# Patient Record
Sex: Male | Born: 1940 | Race: White | Hispanic: No | Marital: Married | Smoking: Never smoker
Health system: Southern US, Community
[De-identification: ages and names within clinical notes are randomized; demographics above are authoritative.]

## PROBLEM LIST (undated history)

## (undated) DIAGNOSIS — M1990 Unspecified osteoarthritis, unspecified site: Secondary | ICD-10-CM

## (undated) DIAGNOSIS — D649 Anemia, unspecified: Secondary | ICD-10-CM

## (undated) DIAGNOSIS — F329 Major depressive disorder, single episode, unspecified: Secondary | ICD-10-CM

## (undated) DIAGNOSIS — F0390 Unspecified dementia without behavioral disturbance: Secondary | ICD-10-CM

## (undated) DIAGNOSIS — K922 Gastrointestinal hemorrhage, unspecified: Secondary | ICD-10-CM

## (undated) DIAGNOSIS — E785 Hyperlipidemia, unspecified: Secondary | ICD-10-CM

## (undated) DIAGNOSIS — F419 Anxiety disorder, unspecified: Secondary | ICD-10-CM

## (undated) DIAGNOSIS — F32A Depression, unspecified: Secondary | ICD-10-CM

## (undated) DIAGNOSIS — K648 Other hemorrhoids: Secondary | ICD-10-CM

## (undated) DIAGNOSIS — I10 Essential (primary) hypertension: Secondary | ICD-10-CM

## (undated) DIAGNOSIS — E039 Hypothyroidism, unspecified: Secondary | ICD-10-CM

## (undated) DIAGNOSIS — Z86010 Personal history of colonic polyps: Secondary | ICD-10-CM

## (undated) HISTORY — DX: Unspecified osteoarthritis, unspecified site: M19.90

## (undated) HISTORY — DX: Other hemorrhoids: K64.8

## (undated) HISTORY — PX: COLONOSCOPY: SHX174

## (undated) HISTORY — DX: Hypothyroidism, unspecified: E03.9

## (undated) HISTORY — PX: JOINT REPLACEMENT: SHX530

## (undated) HISTORY — DX: Depression, unspecified: F32.A

## (undated) HISTORY — DX: Major depressive disorder, single episode, unspecified: F32.9

## (undated) HISTORY — DX: Hyperlipidemia, unspecified: E78.5

## (undated) HISTORY — PX: HERNIA REPAIR: SHX51

## (undated) HISTORY — DX: Anxiety disorder, unspecified: F41.9

## (undated) HISTORY — DX: Personal history of colonic polyps: Z86.010

---

## 1998-08-29 HISTORY — PX: UMBILICAL HERNIA REPAIR: SHX196

## 2004-07-13 ENCOUNTER — Ambulatory Visit: Payer: Self-pay | Admitting: Internal Medicine

## 2004-08-13 ENCOUNTER — Ambulatory Visit: Payer: Self-pay | Admitting: Internal Medicine

## 2004-09-13 ENCOUNTER — Ambulatory Visit: Payer: Self-pay | Admitting: Internal Medicine

## 2004-09-23 ENCOUNTER — Ambulatory Visit: Payer: Self-pay | Admitting: Internal Medicine

## 2004-10-15 ENCOUNTER — Ambulatory Visit: Payer: Self-pay | Admitting: Internal Medicine

## 2004-11-23 ENCOUNTER — Ambulatory Visit: Payer: Self-pay | Admitting: Internal Medicine

## 2004-11-30 ENCOUNTER — Ambulatory Visit: Payer: Self-pay | Admitting: Internal Medicine

## 2004-12-17 ENCOUNTER — Ambulatory Visit: Payer: Self-pay | Admitting: Internal Medicine

## 2005-01-14 ENCOUNTER — Ambulatory Visit: Payer: Self-pay | Admitting: Internal Medicine

## 2005-03-11 ENCOUNTER — Ambulatory Visit: Payer: Self-pay | Admitting: Internal Medicine

## 2005-03-25 ENCOUNTER — Ambulatory Visit: Payer: Self-pay | Admitting: Internal Medicine

## 2005-04-22 ENCOUNTER — Ambulatory Visit: Payer: Self-pay | Admitting: Internal Medicine

## 2005-07-01 ENCOUNTER — Ambulatory Visit: Payer: Self-pay | Admitting: Internal Medicine

## 2005-09-30 ENCOUNTER — Ambulatory Visit: Payer: Self-pay | Admitting: Internal Medicine

## 2005-12-30 ENCOUNTER — Ambulatory Visit: Payer: Self-pay | Admitting: Internal Medicine

## 2006-01-19 ENCOUNTER — Ambulatory Visit: Payer: Self-pay | Admitting: Internal Medicine

## 2006-02-23 ENCOUNTER — Ambulatory Visit: Payer: Self-pay | Admitting: Internal Medicine

## 2006-06-23 ENCOUNTER — Ambulatory Visit: Payer: Self-pay | Admitting: Internal Medicine

## 2006-11-03 ENCOUNTER — Ambulatory Visit: Payer: Self-pay | Admitting: Internal Medicine

## 2006-11-03 LAB — CONVERTED CEMR LAB
Albumin: 3.9 g/dL (ref 3.5–5.2)
BUN: 18 mg/dL (ref 6–23)
CO2: 30 meq/L (ref 19–32)
Calcium: 9.4 mg/dL (ref 8.4–10.5)
Cholesterol: 169 mg/dL (ref 0–200)
Creatinine,U: 299.9 mg/dL
GFR calc Af Amer: 86 mL/min
GFR calc non Af Amer: 71 mL/min
LDL Cholesterol: 97 mg/dL (ref 0–99)
Microalb, Ur: 2.1 mg/dL — ABNORMAL HIGH (ref 0.0–1.9)
Phosphorus: 3.4 mg/dL (ref 2.3–4.6)
Potassium: 4.3 meq/L (ref 3.5–5.1)
Total CHOL/HDL Ratio: 5.2

## 2007-02-15 DIAGNOSIS — F3341 Major depressive disorder, recurrent, in partial remission: Secondary | ICD-10-CM | POA: Insufficient documentation

## 2007-02-15 DIAGNOSIS — E785 Hyperlipidemia, unspecified: Secondary | ICD-10-CM | POA: Insufficient documentation

## 2007-02-15 DIAGNOSIS — E039 Hypothyroidism, unspecified: Secondary | ICD-10-CM | POA: Insufficient documentation

## 2007-02-15 DIAGNOSIS — F410 Panic disorder [episodic paroxysmal anxiety] without agoraphobia: Secondary | ICD-10-CM | POA: Insufficient documentation

## 2007-02-15 DIAGNOSIS — F331 Major depressive disorder, recurrent, moderate: Secondary | ICD-10-CM | POA: Insufficient documentation

## 2007-02-17 DIAGNOSIS — M1990 Unspecified osteoarthritis, unspecified site: Secondary | ICD-10-CM | POA: Insufficient documentation

## 2007-02-17 DIAGNOSIS — E1149 Type 2 diabetes mellitus with other diabetic neurological complication: Secondary | ICD-10-CM | POA: Insufficient documentation

## 2007-03-09 ENCOUNTER — Ambulatory Visit: Payer: Self-pay | Admitting: Internal Medicine

## 2007-03-12 LAB — CONVERTED CEMR LAB
Free T4: 0.9 ng/dL (ref 0.6–1.6)
Hgb A1c MFr Bld: 7.1 % — ABNORMAL HIGH (ref 4.6–6.0)
TSH: 3.42 microintl units/mL (ref 0.35–5.50)

## 2007-05-25 ENCOUNTER — Ambulatory Visit: Payer: Self-pay | Admitting: Internal Medicine

## 2007-06-08 ENCOUNTER — Telehealth (INDEPENDENT_AMBULATORY_CARE_PROVIDER_SITE_OTHER): Payer: Self-pay | Admitting: *Deleted

## 2007-06-11 ENCOUNTER — Telehealth (INDEPENDENT_AMBULATORY_CARE_PROVIDER_SITE_OTHER): Payer: Self-pay | Admitting: *Deleted

## 2007-06-29 ENCOUNTER — Ambulatory Visit: Payer: Self-pay | Admitting: Internal Medicine

## 2007-07-11 ENCOUNTER — Telehealth (INDEPENDENT_AMBULATORY_CARE_PROVIDER_SITE_OTHER): Payer: Self-pay | Admitting: *Deleted

## 2007-07-19 ENCOUNTER — Ambulatory Visit: Payer: Self-pay | Admitting: Internal Medicine

## 2007-08-10 ENCOUNTER — Ambulatory Visit: Payer: Self-pay | Admitting: Internal Medicine

## 2007-09-03 ENCOUNTER — Telehealth (INDEPENDENT_AMBULATORY_CARE_PROVIDER_SITE_OTHER): Payer: Self-pay | Admitting: *Deleted

## 2007-09-27 ENCOUNTER — Ambulatory Visit: Payer: Self-pay | Admitting: Internal Medicine

## 2007-09-28 ENCOUNTER — Encounter: Payer: Self-pay | Admitting: Internal Medicine

## 2007-10-01 LAB — CONVERTED CEMR LAB
ALT: 21 units/L (ref 0–53)
BUN: 17 mg/dL (ref 6–23)
Bilirubin, Direct: 0.1 mg/dL (ref 0.0–0.3)
Calcium: 9.9 mg/dL (ref 8.4–10.5)
Chloride: 105 meq/L (ref 96–112)
Cholesterol: 187 mg/dL (ref 0–200)
Creatinine, Ser: 1.2 mg/dL (ref 0.4–1.5)
Direct LDL: 107.3 mg/dL
GFR calc Af Amer: 78 mL/min
GFR calc non Af Amer: 64 mL/min
HDL: 34.6 mg/dL — ABNORMAL LOW (ref >39.0)
Hgb A1c MFr Bld: 6.6 % — ABNORMAL HIGH (ref 4.6–6.0)
Microalb Creat Ratio: 6.4 mg/g (ref 0.0–30.0)
Sodium: 141 meq/L (ref 135–145)
Total CHOL/HDL Ratio: 5.4
Total Protein: 7 g/dL (ref 6.0–8.3)
Triglycerides: 272 mg/dL (ref 0–149)
VLDL: 54 mg/dL — ABNORMAL HIGH (ref 0–40)

## 2007-10-17 ENCOUNTER — Telehealth (INDEPENDENT_AMBULATORY_CARE_PROVIDER_SITE_OTHER): Payer: Self-pay | Admitting: *Deleted

## 2007-10-25 ENCOUNTER — Ambulatory Visit: Payer: Self-pay | Admitting: Internal Medicine

## 2007-10-31 ENCOUNTER — Ambulatory Visit: Payer: Self-pay | Admitting: Internal Medicine

## 2007-11-05 ENCOUNTER — Telehealth: Payer: Self-pay | Admitting: Internal Medicine

## 2007-11-06 ENCOUNTER — Telehealth: Payer: Self-pay | Admitting: Internal Medicine

## 2007-11-06 ENCOUNTER — Encounter: Payer: Self-pay | Admitting: Internal Medicine

## 2007-11-13 ENCOUNTER — Encounter: Payer: Self-pay | Admitting: Internal Medicine

## 2007-11-14 ENCOUNTER — Telehealth: Payer: Self-pay | Admitting: Internal Medicine

## 2007-11-16 ENCOUNTER — Telehealth (INDEPENDENT_AMBULATORY_CARE_PROVIDER_SITE_OTHER): Payer: Self-pay | Admitting: *Deleted

## 2007-12-28 ENCOUNTER — Ambulatory Visit: Payer: Self-pay | Admitting: Internal Medicine

## 2007-12-28 DIAGNOSIS — R42 Dizziness and giddiness: Secondary | ICD-10-CM | POA: Insufficient documentation

## 2007-12-31 ENCOUNTER — Telehealth (INDEPENDENT_AMBULATORY_CARE_PROVIDER_SITE_OTHER): Payer: Self-pay | Admitting: *Deleted

## 2008-02-18 ENCOUNTER — Telehealth: Payer: Self-pay | Admitting: Internal Medicine

## 2008-04-07 ENCOUNTER — Telehealth: Payer: Self-pay | Admitting: Internal Medicine

## 2008-05-02 ENCOUNTER — Ambulatory Visit: Payer: Self-pay | Admitting: Internal Medicine

## 2008-05-06 LAB — CONVERTED CEMR LAB
AST: 27 units/L (ref 0–37)
Alkaline Phosphatase: 40 units/L (ref 39–117)
Basophils Absolute: 0 10*3/uL (ref 0.0–0.1)
Basophils Relative: 0.9 % (ref 0.0–3.0)
CO2: 28 meq/L (ref 19–32)
Calcium: 9.3 mg/dL (ref 8.4–10.5)
Creatinine, Ser: 1.2 mg/dL (ref 0.4–1.5)
Glucose, Bld: 171 mg/dL — ABNORMAL HIGH (ref 70–99)
HDL: 27.8 mg/dL — ABNORMAL LOW (ref >39.0)
Lymphocytes Relative: 29.3 % (ref 12.0–46.0)
MCHC: 34.1 g/dL (ref 30.0–36.0)
Monocytes Relative: 7.5 % (ref 3.0–12.0)
Neutrophils Relative %: 58.1 % (ref 43.0–77.0)
RBC: 4.48 M/uL (ref 4.22–5.81)
RDW: 13.4 % (ref 11.5–14.6)
Total Bilirubin: 0.7 mg/dL (ref 0.3–1.2)
Total CHOL/HDL Ratio: 6.4
VLDL: 48 mg/dL — ABNORMAL HIGH (ref 0–40)

## 2008-05-12 ENCOUNTER — Encounter: Payer: Self-pay | Admitting: Internal Medicine

## 2008-06-29 HISTORY — PX: TOTAL KNEE ARTHROPLASTY: SHX125

## 2008-07-02 ENCOUNTER — Encounter: Payer: Self-pay | Admitting: Internal Medicine

## 2008-07-03 ENCOUNTER — Ambulatory Visit: Payer: Self-pay | Admitting: Internal Medicine

## 2008-07-03 DIAGNOSIS — R0989 Other specified symptoms and signs involving the circulatory and respiratory systems: Secondary | ICD-10-CM

## 2008-07-03 DIAGNOSIS — R0609 Other forms of dyspnea: Secondary | ICD-10-CM | POA: Insufficient documentation

## 2008-07-08 ENCOUNTER — Ambulatory Visit: Payer: Self-pay

## 2008-07-08 ENCOUNTER — Encounter: Payer: Self-pay | Admitting: Internal Medicine

## 2008-07-14 ENCOUNTER — Telehealth: Payer: Self-pay | Admitting: Internal Medicine

## 2008-08-01 ENCOUNTER — Telehealth: Payer: Self-pay | Admitting: Internal Medicine

## 2008-08-07 ENCOUNTER — Encounter: Payer: Self-pay | Admitting: Internal Medicine

## 2008-08-18 ENCOUNTER — Encounter: Payer: Self-pay | Admitting: Internal Medicine

## 2008-08-20 ENCOUNTER — Encounter: Payer: Self-pay | Admitting: Internal Medicine

## 2008-08-28 ENCOUNTER — Encounter: Payer: Self-pay | Admitting: Internal Medicine

## 2008-09-01 ENCOUNTER — Telehealth: Payer: Self-pay | Admitting: Internal Medicine

## 2008-09-03 ENCOUNTER — Encounter: Payer: Self-pay | Admitting: Internal Medicine

## 2008-09-12 ENCOUNTER — Ambulatory Visit: Payer: Self-pay | Admitting: Internal Medicine

## 2008-10-09 ENCOUNTER — Encounter (INDEPENDENT_AMBULATORY_CARE_PROVIDER_SITE_OTHER): Payer: Self-pay | Admitting: *Deleted

## 2008-10-13 ENCOUNTER — Telehealth: Payer: Self-pay | Admitting: Internal Medicine

## 2008-11-11 ENCOUNTER — Telehealth: Payer: Self-pay | Admitting: Family Medicine

## 2008-11-18 ENCOUNTER — Telehealth: Payer: Self-pay | Admitting: Internal Medicine

## 2008-11-19 ENCOUNTER — Ambulatory Visit: Payer: Self-pay | Admitting: Internal Medicine

## 2008-11-26 ENCOUNTER — Ambulatory Visit: Payer: Self-pay | Admitting: Internal Medicine

## 2008-12-26 ENCOUNTER — Ambulatory Visit: Payer: Self-pay | Admitting: Internal Medicine

## 2008-12-26 LAB — CONVERTED CEMR LAB
AST: 36 units/L (ref 0–37)
Albumin: 4.3 g/dL (ref 3.5–5.2)
CO2: 28 meq/L (ref 19–32)
Calcium: 10 mg/dL (ref 8.4–10.5)
Cholesterol: 215 mg/dL — ABNORMAL HIGH (ref 0–200)
Eosinophils Relative: 2.7 % (ref 0.0–5.0)
Glucose, Bld: 205 mg/dL — ABNORMAL HIGH (ref 70–99)
HCT: 40.4 % (ref 39.0–52.0)
HDL: 37.9 mg/dL — ABNORMAL LOW (ref >39.00)
Hemoglobin: 14 g/dL (ref 13.0–17.0)
Lymphs Abs: 1.7 10*3/uL (ref 0.7–4.0)
Monocytes Relative: 7.8 % (ref 3.0–12.0)
Platelets: 240 10*3/uL (ref 150.0–400.0)
Sodium: 139 meq/L (ref 135–145)
Total CHOL/HDL Ratio: 6
Triglycerides: 201 mg/dL — ABNORMAL HIGH (ref 0.0–149.0)
VLDL: 40.2 mg/dL — ABNORMAL HIGH (ref 0.0–40.0)
WBC: 7 10*3/uL (ref 4.5–10.5)

## 2009-01-27 ENCOUNTER — Telehealth: Payer: Self-pay | Admitting: Internal Medicine

## 2009-01-27 HISTORY — PX: TOTAL KNEE ARTHROPLASTY: SHX125

## 2009-02-03 ENCOUNTER — Encounter: Payer: Self-pay | Admitting: Internal Medicine

## 2009-02-26 ENCOUNTER — Encounter: Payer: Self-pay | Admitting: Internal Medicine

## 2009-03-27 ENCOUNTER — Ambulatory Visit: Payer: Self-pay | Admitting: Internal Medicine

## 2009-03-30 LAB — CONVERTED CEMR LAB
ALT: 18 units/L (ref 0–53)
Albumin: 4.3 g/dL (ref 3.5–5.2)
CO2: 25 meq/L (ref 19–32)
Calcium: 9.8 mg/dL (ref 8.4–10.5)
Cholesterol: 215 mg/dL — ABNORMAL HIGH (ref 0–200)
Eosinophils Relative: 3.8 % (ref 0.0–5.0)
Free T4: 0.8 ng/dL (ref 0.6–1.6)
HDL: 42 mg/dL (ref >39.00)
Hgb A1c MFr Bld: 7 % — ABNORMAL HIGH (ref 4.6–6.5)
Lymphocytes Relative: 23.8 % (ref 12.0–46.0)
Monocytes Relative: 6.5 % (ref 3.0–12.0)
Neutrophils Relative %: 64.8 % (ref 43.0–77.0)
Platelets: 285 10*3/uL (ref 150.0–400.0)
Potassium: 4.6 meq/L (ref 3.5–5.1)
Sodium: 136 meq/L (ref 135–145)
TSH: 3.99 microintl units/mL (ref 0.35–5.50)
Total Bilirubin: 0.9 mg/dL (ref 0.3–1.2)
Total Protein: 7.5 g/dL (ref 6.0–8.3)
Triglycerides: 317 mg/dL — ABNORMAL HIGH (ref 0.0–149.0)
WBC: 7.1 10*3/uL (ref 4.5–10.5)

## 2009-04-20 ENCOUNTER — Telehealth: Payer: Self-pay | Admitting: Internal Medicine

## 2009-06-01 ENCOUNTER — Telehealth: Payer: Self-pay | Admitting: Internal Medicine

## 2009-06-02 ENCOUNTER — Telehealth: Payer: Self-pay | Admitting: Internal Medicine

## 2009-06-02 ENCOUNTER — Encounter: Payer: Self-pay | Admitting: Internal Medicine

## 2009-06-30 ENCOUNTER — Telehealth: Payer: Self-pay | Admitting: Internal Medicine

## 2009-07-09 ENCOUNTER — Telehealth: Payer: Self-pay | Admitting: Internal Medicine

## 2009-07-20 ENCOUNTER — Telehealth: Payer: Self-pay | Admitting: Internal Medicine

## 2009-07-29 ENCOUNTER — Telehealth: Payer: Self-pay | Admitting: Internal Medicine

## 2009-07-30 ENCOUNTER — Encounter: Payer: Self-pay | Admitting: Internal Medicine

## 2009-08-06 ENCOUNTER — Telehealth: Payer: Self-pay | Admitting: Internal Medicine

## 2009-09-23 ENCOUNTER — Ambulatory Visit: Payer: Self-pay | Admitting: Internal Medicine

## 2009-09-24 LAB — CONVERTED CEMR LAB
AST: 29 units/L (ref 0–37)
Albumin: 4.2 g/dL (ref 3.5–5.2)
Basophils Relative: 1 % (ref 0.0–3.0)
CO2: 28 meq/L (ref 19–32)
Cholesterol: 177 mg/dL (ref 0–200)
Eosinophils Relative: 3.3 % (ref 0.0–5.0)
Free T4: 0.8 ng/dL (ref 0.6–1.6)
GFR calc non Af Amer: 63.64 mL/min (ref >60)
Glucose, Bld: 159 mg/dL — ABNORMAL HIGH (ref 70–99)
HDL: 43.9 mg/dL (ref >39.00)
Hgb A1c MFr Bld: 7 % — ABNORMAL HIGH (ref 4.6–6.5)
LDL Cholesterol: 97 mg/dL (ref 0–99)
Lymphocytes Relative: 22.7 % (ref 12.0–46.0)
MCV: 88.8 fL (ref 78.0–100.0)
Monocytes Absolute: 0.3 10*3/uL (ref 0.1–1.0)
Monocytes Relative: 5.8 % (ref 3.0–12.0)
Neutrophils Relative %: 67.2 % (ref 43.0–77.0)
PSA: 0.44 ng/mL (ref 0.10–4.00)
Platelets: 241 10*3/uL (ref 150.0–400.0)
RBC: 4.81 M/uL (ref 4.22–5.81)
Sodium: 138 meq/L (ref 135–145)
TSH: 4.67 microintl units/mL (ref 0.35–5.50)
Total CHOL/HDL Ratio: 4
Total Protein: 7.7 g/dL (ref 6.0–8.3)
Triglycerides: 182 mg/dL — ABNORMAL HIGH (ref 0.0–149.0)
WBC: 5.6 10*3/uL (ref 4.5–10.5)

## 2009-10-01 ENCOUNTER — Ambulatory Visit: Payer: Self-pay | Admitting: Internal Medicine

## 2009-10-05 ENCOUNTER — Encounter: Payer: Self-pay | Admitting: Internal Medicine

## 2009-10-05 LAB — CONVERTED CEMR LAB: Fecal Occult Bld: NEGATIVE

## 2009-12-09 ENCOUNTER — Ambulatory Visit: Payer: Self-pay | Admitting: Internal Medicine

## 2009-12-11 ENCOUNTER — Telehealth: Payer: Self-pay | Admitting: Internal Medicine

## 2009-12-14 ENCOUNTER — Encounter: Payer: Self-pay | Admitting: Internal Medicine

## 2009-12-14 ENCOUNTER — Telehealth: Payer: Self-pay | Admitting: Internal Medicine

## 2010-01-13 ENCOUNTER — Ambulatory Visit: Payer: Self-pay | Admitting: Internal Medicine

## 2010-01-26 ENCOUNTER — Telehealth: Payer: Self-pay | Admitting: Internal Medicine

## 2010-02-02 ENCOUNTER — Telehealth: Payer: Self-pay | Admitting: Internal Medicine

## 2010-02-03 ENCOUNTER — Encounter: Payer: Self-pay | Admitting: Internal Medicine

## 2010-02-03 ENCOUNTER — Telehealth: Payer: Self-pay | Admitting: Internal Medicine

## 2010-02-15 ENCOUNTER — Telehealth: Payer: Self-pay | Admitting: Internal Medicine

## 2010-02-16 ENCOUNTER — Encounter: Payer: Self-pay | Admitting: Internal Medicine

## 2010-02-23 ENCOUNTER — Encounter: Payer: Self-pay | Admitting: Internal Medicine

## 2010-03-04 ENCOUNTER — Encounter: Payer: Self-pay | Admitting: Internal Medicine

## 2010-03-26 ENCOUNTER — Ambulatory Visit: Payer: Self-pay | Admitting: Internal Medicine

## 2010-03-26 LAB — HM DIABETES FOOT EXAM

## 2010-03-29 LAB — CONVERTED CEMR LAB
ALT: 18 units/L (ref 0–53)
AST: 29 units/L (ref 0–37)
Albumin: 4.6 g/dL (ref 3.5–5.2)
Alkaline Phosphatase: 54 units/L (ref 39–117)
BUN: 17 mg/dL (ref 6–23)
Basophils Absolute: 0.1 10*3/uL (ref 0.0–0.1)
Bilirubin, Direct: 0.1 mg/dL (ref 0.0–0.3)
Chloride: 101 meq/L (ref 96–112)
Cholesterol: 197 mg/dL (ref 0–200)
Creatinine, Ser: 1.3 mg/dL (ref 0.4–1.5)
Creatinine,U: 204.1 mg/dL
Eosinophils Relative: 4.9 % (ref 0.0–5.0)
GFR calc non Af Amer: 60.07 mL/min (ref >60)
Glucose, Bld: 135 mg/dL — ABNORMAL HIGH (ref 70–99)
HCT: 43.7 % (ref 39.0–52.0)
Lymphs Abs: 1.9 10*3/uL (ref 0.7–4.0)
Microalb, Ur: 1 mg/dL (ref 0.0–1.9)
Monocytes Absolute: 0.6 10*3/uL (ref 0.1–1.0)
Monocytes Relative: 6.8 % (ref 3.0–12.0)
Neutrophils Relative %: 65 % (ref 43.0–77.0)
Platelets: 271 10*3/uL (ref 150.0–400.0)
RDW: 15.1 % — ABNORMAL HIGH (ref 11.5–14.6)
Total Protein: 7.6 g/dL (ref 6.0–8.3)
WBC: 8.6 10*3/uL (ref 4.5–10.5)

## 2010-05-06 ENCOUNTER — Telehealth: Payer: Self-pay | Admitting: Internal Medicine

## 2010-05-07 ENCOUNTER — Ambulatory Visit: Payer: Self-pay | Admitting: Internal Medicine

## 2010-05-07 DIAGNOSIS — R198 Other specified symptoms and signs involving the digestive system and abdomen: Secondary | ICD-10-CM | POA: Insufficient documentation

## 2010-05-11 ENCOUNTER — Telehealth (INDEPENDENT_AMBULATORY_CARE_PROVIDER_SITE_OTHER): Payer: Self-pay | Admitting: *Deleted

## 2010-05-11 ENCOUNTER — Ambulatory Visit: Payer: Self-pay | Admitting: Psychology

## 2010-05-18 ENCOUNTER — Ambulatory Visit: Payer: Self-pay | Admitting: Psychology

## 2010-05-26 ENCOUNTER — Encounter: Payer: Self-pay | Admitting: Internal Medicine

## 2010-06-02 ENCOUNTER — Ambulatory Visit: Payer: Self-pay | Admitting: Psychology

## 2010-06-02 ENCOUNTER — Ambulatory Visit: Payer: Self-pay | Admitting: Internal Medicine

## 2010-06-09 ENCOUNTER — Ambulatory Visit: Payer: Self-pay | Admitting: Psychology

## 2010-06-09 ENCOUNTER — Telehealth: Payer: Self-pay | Admitting: Internal Medicine

## 2010-06-16 ENCOUNTER — Encounter: Payer: Self-pay | Admitting: Internal Medicine

## 2010-06-30 ENCOUNTER — Ambulatory Visit: Payer: Self-pay | Admitting: Psychology

## 2010-07-14 ENCOUNTER — Telehealth: Payer: Self-pay | Admitting: Internal Medicine

## 2010-07-19 ENCOUNTER — Ambulatory Visit: Payer: Self-pay | Admitting: Internal Medicine

## 2010-07-20 LAB — CONVERTED CEMR LAB
Alkaline Phosphatase: 49 units/L (ref 39–117)
Basophils Relative: 0.9 % (ref 0.0–3.0)
Bilirubin, Direct: 0.1 mg/dL (ref 0.0–0.3)
CO2: 28 meq/L (ref 19–32)
Calcium: 9.7 mg/dL (ref 8.4–10.5)
Chloride: 101 meq/L (ref 96–112)
Cholesterol: 198 mg/dL (ref 0–200)
Direct LDL: 119.8 mg/dL
Eosinophils Relative: 3.5 % (ref 0.0–5.0)
GFR calc non Af Amer: 61.71 mL/min (ref >60)
HCT: 41.4 % (ref 39.0–52.0)
Hemoglobin: 14.2 g/dL (ref 13.0–17.0)
Lymphs Abs: 1.8 10*3/uL (ref 0.7–4.0)
MCV: 89.1 fL (ref 78.0–100.0)
Monocytes Relative: 6.9 % (ref 3.0–12.0)
Neutro Abs: 5.3 10*3/uL (ref 1.4–7.7)
RBC: 4.65 M/uL (ref 4.22–5.81)
Sodium: 138 meq/L (ref 135–145)
TSH: 2.69 microintl units/mL (ref 0.35–5.50)
Total Bilirubin: 0.5 mg/dL (ref 0.3–1.2)
Total CHOL/HDL Ratio: 5
VLDL: 43.6 mg/dL — ABNORMAL HIGH (ref 0.0–40.0)
WBC: 8 10*3/uL (ref 4.5–10.5)

## 2010-07-30 ENCOUNTER — Encounter: Payer: Self-pay | Admitting: Internal Medicine

## 2010-08-03 ENCOUNTER — Telehealth: Payer: Self-pay | Admitting: Internal Medicine

## 2010-08-18 ENCOUNTER — Ambulatory Visit: Payer: Self-pay | Admitting: Internal Medicine

## 2010-08-18 DIAGNOSIS — E538 Deficiency of other specified B group vitamins: Secondary | ICD-10-CM | POA: Insufficient documentation

## 2010-08-19 LAB — CONVERTED CEMR LAB: Vitamin B-12: 550 pg/mL (ref 211–911)

## 2010-09-14 ENCOUNTER — Telehealth: Payer: Self-pay | Admitting: Internal Medicine

## 2010-09-21 ENCOUNTER — Encounter: Payer: Self-pay | Admitting: Internal Medicine

## 2010-09-22 ENCOUNTER — Encounter: Payer: Self-pay | Admitting: Internal Medicine

## 2010-09-28 ENCOUNTER — Telehealth: Payer: Self-pay | Admitting: Internal Medicine

## 2010-09-29 ENCOUNTER — Encounter: Payer: Self-pay | Admitting: Internal Medicine

## 2010-09-29 NOTE — Letter (Signed)
Summary: Generic Letter  [place] [facility]  [address] [place], [place] [address]   Phone: [phone]  Fax: [phone]           02/16/2010  [employer] [facility] Pharmacy Appeals Coordinator [address] [address] [place], [place] [address]  Case #[id number]  Dear Pharmacy Appeals Coordinator,  I am writing on behalf of myself and Coran Dipaola (DOB Aug 13, 2041) in protest of the need to do a prior certification every 60 days for his generic methylphenidate.  Mr Condrey has been my patient for many years. He has suffered life long depression which has been severe and disabling at times. Fortunately, we have found an aggressive treatment regimen, including zyprexa  and methylphenidate, which has kept him under control and functional.  Your company's insistence on a prior certification every 60 days has already caused treatment interruption and exacerbation of Mr [name]'s depression. There is no clear rationale for this requirement (I have no significant problem with a 1 year prior certification).  I request that you approve his methylphenidate prescription for 1 year at a time to enhance his compliance and treatment success. It is ironic that your company is running a large "scapegoat" campaign in the press when you seem to be making a scapegoat of Mr Sossamon. Perhaps the press would be interested in hearing about your arcane policies that negatively impact people's health?  Please respond as soon as possible and I appeal to your common sense in this manner. I will do what I need to in order to protect the health of my patient.     Sincerely,     Tillman Abide MD

## 2010-09-29 NOTE — Progress Notes (Signed)
Summary: B12  Phone Note Call from Patient   Caller: Patient Call For: Cindee Salt MD Summary of Call: Recieved called from Dr. Hazle Coca from [facility]. He says that patient's B12 was 167, and he feels that he needs to start a replacement. He states that he will be faxing over the notes.  Initial call taken by: Melody Comas,  August 03, 2010 4:26 PM  Follow-up for Phone Call        absolutely Please have him take over the counter B12 1000 micrograms daily for 2 weeks and come in for repeat level then If it remains low, we will start with B12 shots monthly Follow-up by: Cindee Salt MD,  August 04, 2010 7:18 AM  Additional Follow-up for Phone Call Additional follow up Details #1::        Spoke with pt and advised him.  He will call back in 2 weeks to schedule lab appt. Additional Follow-up by: Lowella Petties CMA, AAMA,  August 04, 2010 10:26 AM

## 2010-09-29 NOTE — Medication Information (Signed)
Summary: Methylphenidate Denied  Methylphenidate Denied   Imported By: Maryln Gottron 03/09/2010 10:10:52  _____________________________________________________________________  External Attachment:    Type:   Image     Comment:   External Document

## 2010-09-29 NOTE — Medication Information (Signed)
Summary: Prior Authorization & Approval for Methylin/[facility]  Prior Authorization & Approval for Methylin/[employer]   Imported By: Lanelle Bal 12/22/2009 09:35:23  _____________________________________________________________________  External Attachment:    Type:   Image     Comment:   External Document

## 2010-09-29 NOTE — Assessment & Plan Note (Signed)
Summary: ROA FOR 6 WEEK FOLLOW-UP/JRR   Vital Signs:  Patient profile:   70 year old male Weight:      228 pounds Temp:     98.6 degrees F oral Pulse rate:   90 / minute Pulse rhythm:   regular BP sitting:   110 / 70  (left arm) Cuff size:   large  Vitals Entered By: Mervin Hack CMA Duncan Dull) (June 02, 2010 11:21 AM) CC: 6 week follow-up   History of Present Illness: "I feel about the same as I did" does seem that the increased ritalin has helped some--- he feels some better Wife feels he is better Not sleeping as much--just at night Gets up out of bed in AM, etc  Still stays around the house mostly Has been getting out walking some Went to church on Sunday for the first time in a while--then [employer] and lunch  Liked Dr Laymond Purser but cancelled yesterday's appt  Sister had ECT and had bad experience  He would not consider this  Allergies: 1)  ! Vytorin 2)  ! Augmentin  Past History:  Past medical, surgical, family and social histories (including risk factors) reviewed for relevance to current acute and chronic problems.  Past Medical History: Reviewed history from 09/12/2008 and no changes required. Anxiety Depression--recurrent and severe Diabetes mellitus, type II with nephropathy Hyperlipidemia Hypothyroidism Osteoarthritis  Past Surgical History: Reviewed history from 03/27/2009 and no changes required. umbical herniorrhaphy  2000 Right TKR--Dr Remer Macho ([facility]) 11/09 Left TKR---Dr Claretta Fraise        6/10  Family History: Reviewed history from 02/15/2007 and no changes required. Dad died @77   CHF, DM, depression Mom died @82  sepsis 1 sister--breast cancer, ??lung cancer, depression CAD in Dad No prostate or colon cancer Strokes on Dad's side Cousin with Parkinson's  Social History: Reviewed history from 12/09/2009 and no changes required. 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   Psych:  normally interactive, good eye contact, not anxious appearing, and not depressed appearing.     Impression & Recommendations:  Problem # 1:  DEPRESSION (ICD-311) Assessment Improved has finally improved hypersomnia has resolved able to get back to church   will continue the current meds I recommended continuing with Dr Laymond Purser for now  His updated medication list for this problem includes:    Paxil 40 Mg Tabs (Paroxetine hcl) .Marland Kitchen... Take 1& 1/2  tablet by mouth once a day for depression and anxiety    Clonazepam 2 Mg Tabs (Clonazepam) .Marland Kitchen... Take 1 tablet by mouth three times a day as needed    Buspirone Hcl 15 Mg Tabs (Buspirone hcl) .Marland Kitchen... Take one by mouth three times a day  Complete Medication List: 1)  Lovastatin 40 Mg Tabs (Lovastatin) .... Take 1 tablet by mouth once a day 2)  Levoxyl 75 Mcg Tabs (Levothyroxine sodium) .... Take 1 tablet by mouth once a day 3)  Lisinopril 10 Mg Tabs (Lisinopril) .... Take 1 tablet by mouth once a day 4)  Glucophage 500 Mg Tabs (Metformin hcl) .... Take 1 tablet by mouth before breakfast and lunch and 2 before supper 5)  Paxil 40 Mg Tabs (Paroxetine hcl) .... Take 1& 1/2  tablet by mouth once a day for depression and anxiety 6)  Clonazepam 2 Mg Tabs (Clonazepam) .Marland KitchenMarland KitchenMarland Kitchen  Take 1 tablet by mouth three times a day as needed 7)  Buspirone Hcl 15 Mg Tabs (Buspirone hcl) .... Take one by mouth three times a day 8)  Zyprexa 10 Mg Tabs (Olanzapine) .Marland Kitchen.. 1 at bedtime for severe depression by mouth 9)  Glipizide 5 Mg Tabs (Glipizide) .Marland Kitchen.. 1 tab by mouth two times a day before breakfast and supper 10)  Methylphenidate Hcl 10 Mg Tabs (Methylphenidate hcl) .Marland Kitchen.. 1 tab by mouth two times a day for depression  Patient Instructions: 1)  Please schedule a follow-up appointment in 3 months .  Prescriptions: ZYPREXA 10 MG TABS (OLANZAPINE) 1  at bedtime for severe depression by mouth  #30 x 11   Entered and Authorized by:   Cindee Salt MD   Signed by:   Cindee Salt MD on 06/02/2010   Method used:   Electronically to        [facility]* (retail)       [address] [place], [place]  [address]       Ph: [phone]       Fax: [phone]   RxID:   [id number] METHYLPHENIDATE HCL 10 MG TABS (METHYLPHENIDATE HCL) 1 tab by mouth two times a day for depression  #60 x 0   Entered and Authorized by:   Cindee Salt MD   Signed by:   Cindee Salt MD on 06/02/2010   Method used:   Print then Give to Patient   RxID:   [id number]   Current Allergies (reviewed today): ! VYTORIN ! AUGMENTIN  Appended Document: Orders Update    Clinical Lists Changes  Orders: Added new Service order of Est. Patient Level III (74259) - Signed

## 2010-09-29 NOTE — Letter (Signed)
Summary: E-Mail from Dr.Jane Rudean Curt from Dr.Jane [place]   Imported By: Beau Fanny 05/26/2010 14:07:33  _____________________________________________________________________  External Attachment:    Type:   Image     Comment:   External Document

## 2010-09-29 NOTE — Progress Notes (Signed)
Summary: prior Berkley Harvey is needed for methylin  Phone Note From Pharmacy   [facility]: [facility] Summary of Call: Prior Berkley Harvey is needed for methylin, form is on your desk. Initial call taken by: Lowella Petties CMA,  December 11, 2009 11:52 AM  Follow-up for Phone Call        form done Follow-up by: Cindee Salt MD,  December 11, 2009 1:47 PM  Additional Follow-up for Phone Call Additional follow up Details #1::        form faxed to [employer] Additional Follow-up by: Mervin Hack CMA Duncan Dull),  December 11, 2009 2:20 PM

## 2010-09-29 NOTE — Medication Information (Signed)
Summary: Verification of Prescription for Zyprexa  Verification of Prescription for Zyprexa   Imported By: Maryln Gottron 06/30/2010 11:08:13  _____________________________________________________________________  External Attachment:    Type:   Image     Comment:   External Document

## 2010-09-29 NOTE — Assessment & Plan Note (Signed)
Summary: NEEDS TO TALK TO DR./JRR   Vital Signs:  Patient profile:   70 year old male Weight:      235 pounds BMI:     31.99 O2 Sat:      91 % on Room air Temp:     98.0 degrees F oral Pulse rate:   111 / minute Pulse rhythm:   regular BP sitting:   110 / 80  (left arm) Cuff size:   large  Vitals Entered By: Mervin Hack CMA Duncan Dull) (July 19, 2010 11:04 AM)  O2 Flow:  Room air CC: discuss upcoming appt.   History of Present Illness: Referred to Dr Nicki Reaper --psychiatrist at [facility] has scheduled MRI of brain concerned about instabilty May be considering ECT  Feels like he has reached a plateau "Not where I want to be"  Still able to go to [employer] hunting for the first time in 3 years  Hypersomnolence in daytime has not recurred still sleeps close to 12 hours at night though  Allergies: 1)  ! Vytorin 2)  ! Augmentin  Past History:  Past medical, surgical, family and social histories (including risk factors) reviewed for relevance to current acute and chronic problems.  Past Medical History: Reviewed history from 09/12/2008 and no changes required. Anxiety Depression--recurrent and severe Diabetes mellitus, type II with nephropathy Hyperlipidemia Hypothyroidism Osteoarthritis  Past Surgical History: Reviewed history from 03/27/2009 and no changes required. umbical herniorrhaphy  2000 Right TKR--Dr Remer Macho ([facility]) 11/09 Left TKR---Dr Claretta Fraise        6/10  Family History: Reviewed history from 02/15/2007 and no changes required. Dad died @77   CHF, DM, depression Mom died @82  sepsis 1 sister--breast cancer, ??lung cancer, depression CAD in Dad No prostate or colon cancer Strokes on Dad's side Cousin with Parkinson's  Social History: Reviewed history from 12/09/2009 and no changes required. 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     Impression & Recommendations:  Problem # 1:  DEPRESSION (ICD-311) Assessment Comment Only ongoing severe symptoms has improved from most recent exacerbation--not suicidal, etc----but still not back to best baseline He has had marked exacerbations with attempts to wean zyprexa and methylphenidate he may be a reasonable candidate for ECT Counselled more than half of 15 minute visit  His updated medication list for this problem includes:    Paxil 40 Mg Tabs (Paroxetine hcl) .Marland Kitchen... Take 1& 1/2  tablet by mouth once a day for depression and anxiety    Clonazepam 2 Mg Tabs (Clonazepam) .Marland Kitchen... Take 1 tablet by mouth three times a day as needed    Buspirone Hcl 15 Mg Tabs (Buspirone hcl) .Marland Kitchen... Take one by mouth three times a day  Problem # 2:  DIABETES MELLITUS, TYPE II, WITH RENAL COMPLICATIONS (ICD-250.40) Assessment: Unchanged  will recheck labs  His updated medication list for this problem includes:    Lisinopril 10 Mg Tabs (Lisinopril) .Marland Kitchen... Take 1 tablet by mouth once a day    Glucophage 500 Mg Tabs (Metformin hcl) .Marland Kitchen... Take 1 tablet by mouth before breakfast and lunch and 2 before supper    Glipizide 5 Mg Tabs (Glipizide) .Marland Kitchen... 1 tab by mouth two times a day before breakfast and supper  Labs Reviewed: Creat: 1.3 (03/26/2010)  Last Eye Exam: No diabetic retinopathy.   GLaucoma suspect (03/04/2010) Reviewed HgBA1c results: 7.7 (03/26/2010)  7.0 (09/23/2009)  Orders: TLB-A1C / Hgb A1C (Glycohemoglobin) (83036-A1C) TLB-Renal Function Panel (80069-RENAL) TLB-CBC Platelet - w/Differential (85025-CBCD)  Problem # 3:  HYPOTHYROIDISM (ICD-244.9) Assessment: Unchanged  will recheck labs in view of possible new Rx  His updated medication list for this problem includes:    Levoxyl 75 Mcg Tabs (Levothyroxine sodium) .Marland Kitchen... Take 1 tablet  by mouth once a day  Labs Reviewed: TSH: 4.67 (09/23/2009)    HgBA1c: 7.7 (03/26/2010) Chol: 197 (03/26/2010)   HDL: 44.30 (03/26/2010)   LDL: 97 (09/23/2009)   TG: 270.0 (03/26/2010)  Orders: TLB-T4 (Thyrox), Free [phone]) TLB-TSH (Thyroid Stimulating Hormone) (84443-TSH)  Complete Medication List: 1)  Lovastatin 40 Mg Tabs (Lovastatin) .... Take 1 tablet by mouth once a day 2)  Levoxyl 75 Mcg Tabs (Levothyroxine sodium) .... Take 1 tablet by mouth once a day 3)  Lisinopril 10 Mg Tabs (Lisinopril) .... Take 1 tablet by mouth once a day 4)  Glucophage 500 Mg Tabs (Metformin hcl) .... Take 1 tablet by mouth before breakfast and lunch and 2 before supper 5)  Paxil 40 Mg Tabs (Paroxetine hcl) .... Take 1& 1/2  tablet by mouth once a day for depression and anxiety 6)  Clonazepam 2 Mg Tabs (Clonazepam) .... Take 1 tablet by mouth three times a day as needed 7)  Buspirone Hcl 15 Mg Tabs (Buspirone hcl) .... Take one by mouth three times a day 8)  Zyprexa 10 Mg Tabs (Olanzapine) .Marland Kitchen.. 1 at bedtime for severe depression by mouth 9)  Glipizide 5 Mg Tabs (Glipizide) .Marland Kitchen.. 1 tab by mouth two times a day before breakfast and supper 10)  Methylphenidate Hcl 10 Mg Tabs (Methylphenidate hcl) .Marland Kitchen.. 1 tab by mouth two times a day for depression  Other Orders: TLB-Lipid Panel (80061-LIPID) TLB-Hepatic/Liver Function Pnl (80076-HEPATIC) Venipuncture (21308)  Patient Instructions: 1)  Please Keep the January 13th appt   Orders Added: 1)  Est. Patient Level III [65784] 2)  TLB-A1C / Hgb A1C (Glycohemoglobin) [83036-A1C] 3)  TLB-T4 (Thyrox), Free [69629-BM8U] 4)  TLB-TSH (Thyroid Stimulating Hormone) [84443-TSH] 5)  TLB-Renal Function Panel [80069-RENAL] 6)  TLB-CBC Platelet - w/Differential [85025-CBCD] 7)  TLB-Lipid Panel [80061-LIPID] 8)  TLB-Hepatic/Liver Function Pnl [80076-HEPATIC] 9)  Venipuncture [13244]    Current Allergies (reviewed today): ! VYTORIN ! AUGMENTIN

## 2010-09-29 NOTE — Progress Notes (Signed)
Summary: [employer]  Phone Note Other Incoming   Caller: [employer] prior [employer] of Call: they have re-sent another form to be filled out because they state it is incorrect. Form on your desk  Initial call taken by: Mervin Hack CMA Duncan Dull),  December 14, 2009 12:21 PM  Follow-up for Phone Call        I have also attached the previous form and also the one that was approved in 07/2009. just as reference DeShannon Katrinka Blazing CMA Duncan Dull)  December 14, 2009 12:22 PM   I skipped one question by mistake form redone Follow-up by: Cindee Salt MD,  December 14, 2009 1:33 PM  Additional Follow-up for Phone Call Additional follow up Details #1::        form re-faxed. Additional Follow-up by: Mervin Hack CMA Duncan Dull),  December 14, 2009 3:08 PM

## 2010-09-29 NOTE — Progress Notes (Signed)
Summary: prior auth needed for methylphenidate  Phone Note From Pharmacy   [facility]: [facility] Summary of Call: Prior Berkley Harvey is needed for methylphenidate, form is on your desk. Initial call taken by: Lowella Petties CMA,  February 03, 2010 9:23 AM  Follow-up for Phone Call        form done  please give Rx for mail away for #180 since we have to do prior authorization each time Follow-up by: Cindee Salt MD,  February 03, 2010 10:27 AM  Additional Follow-up for Phone Call Additional follow up Details #1::        spoke with patient and he had to pay out of pocket for this rx, his insurance wouldn't pay. Pt would like to speak with you personally about this medication, his next appt is not until 03/26/2010, he would like a phone call from Dr. Alphonsus Sias if possible. Prior auth form faxed to [employer] and rx not given to pt yet. DeShannon Katrinka Blazing CMA Duncan Dull)  February 03, 2010 3:13 PM   Please call [employer] Let them know that their refusal to approve this medicine in a timely fashion has caused grievous harm to my patient I would like to speak to their medical director or I will make a formal complaint to the [facility], the [employer], whomever I have to since I believe their refusal to fill this presciption may be criminal in nature!!!! Please return the note to me to call patient and hopefully [employer] geologist. I want a direct number or I will make a formal complaint Let them know I will also call the newspaper so that their egregious behavior can come to light in the press Cindee Salt MD  February 03, 2010 6:55 PM   per Jacki Cones, pt's rx was approved thru [employer], he will be reimbursed by [employer].  Did you still want a direct number to speak with someone? DeShannon Smith CMA Duncan Dull)  February 05, 2010 10:40 AM   Please call and see why they won't do preapproval for more than 2 months. This has already affected his therapy and could have major adverse response I  would like to see if he can mail away and doesn't have to do monthly Cindee Salt MD  February 05, 2010 10:57 AM   spoke with a [facility] pharmacist [phone] press 1 for pharmacist) and they reason for the short coverage is the diag of depression, normally this is used for ADD or ADHD per pharmacist. I will call [facility] and have them re-submit the claim so it will be back dated until 01/13/2010. Please advise.   Please get me a direct line for the medical director. This is not reasonable Cindee Salt MD  February 05, 2010 2:10 PM       Additional Follow-up for Phone Call Additional follow up Details #2::    they don't have medical directors, they have supervisors,  the pharmacist I spoke with gave me a name of Jacklynn Lewis his number is [phone]. DeShannon Smith CMA Duncan Dull)  February 08, 2010 3:30 PM   Spoke to him today explained my concerns If they don't approve prior authorization for 1 year ahead, I will proceed with more formal complaints. Patient missed doses this time and had sig negative clinical outcome Cindee Salt MD  February 10, 2010 2:09 PM   Prescriptions: METHYLPHENIDATE HCL 5 MG TABS (METHYLPHENIDATE HCL) 1 two times a day as directed  for depression by mouth  #180 x 0   Entered and Authorized by:   Cindee Salt MD   Signed by:   Cindee Salt MD on 02/03/2010   Method used:   Print then Give to Patient   RxID:   [id number]

## 2010-09-29 NOTE — Progress Notes (Signed)
Summary: refill request for clonazepam  Phone Note Refill Request Message from:  Fax from Pharmacy  Refills Requested: Medication #1:  CLONAZEPAM 2 MG  TABS Take 1 tablet by mouth three times a day as needed   Last Refilled: 04/16/2010 Faxed request from [employer] is on your desk.  Initial call taken by: Lowella Petties CMA,  June 09, 2010 2:31 PM  Follow-up for Phone Call        Okay #90 x 2 Follow-up by: Cindee Salt MD,  June 10, 2010 12:53 PM  Additional Follow-up for Phone Call Additional follow up Details #1::        Rx faxed to pharmacy Additional Follow-up by: DeShannon Smith CMA Duncan Dull),  June 10, 2010 1:25 PM    Prescriptions: CLONAZEPAM 2 MG  TABS (CLONAZEPAM) Take 1 tablet by mouth three times a day as needed  #90 x 2   Entered by:   Mervin Hack CMA (AAMA)   Authorized by:   Cindee Salt MD   Signed by:   Mervin Hack CMA (AAMA) on 06/10/2010   Method used:   Handwritten   RxID:   [id number]

## 2010-09-29 NOTE — Assessment & Plan Note (Signed)
Summary: PER DR LETVAK/CLE   Vital Signs:  Patient profile:   70 year old male Weight:      228 pounds Temp:     98.4 degrees F oral Pulse rate:   80 / minute Pulse rhythm:   regular BP sitting:   120 / 70  (left arm) Cuff size:   large  Vitals Entered By: Mervin Hack CMA Duncan Dull) (May 07, 2010 3:31 PM) CC: Nerves   History of Present Illness: depression has been worse lately feels it has been bad for at least 3-4 weeks No good days over that time  Gets palpitations and his anxiety is acting up Feels shaking and sensation in chest No obvious relationship with taking the ritalin  Has thought about dying and suicide but doesn't have a plan  Wife concerned about him being on too much medication  He states he didn't get benefit from psychiatrist in past Saw Dr Humberto Seals notes he enjoys this but then had insurance issues  Allergies: 1)  ! Vytorin 2)  ! Augmentin  Past History:  Past medical, surgical, family and social histories (including risk factors) reviewed for relevance to current acute and chronic problems.  Past Medical History: Reviewed history from 09/12/2008 and no changes required. Anxiety Depression--recurrent and severe Diabetes mellitus, type II with nephropathy Hyperlipidemia Hypothyroidism Osteoarthritis  Past Surgical History: Reviewed history from 03/27/2009 and no changes required. umbical herniorrhaphy  2000 Right TKR--Dr Remer Macho ([facility]) 11/09 Left TKR---Dr Claretta Fraise        6/10  Family History: Reviewed history from 02/15/2007 and no changes required. Dad died @77   CHF, DM, depression Mom died @82  sepsis 1 sister--breast cancer, ??lung cancer, depression CAD in Dad No prostate or colon cancer Strokes on Dad's side Cousin with Parkinson's  Social History: Reviewed history from 12/09/2009 and no changes required. 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   Psych:  depressed affect, subdued, withdrawn, and slightly anxious.     Impression & Recommendations:  Problem # 1:  DEPRESSION (ICD-311) Assessment Deteriorated  no active suicidal ideation hypersomnia  P: will increase the ritalin to 10mg  two times a day    appt with Dr Laymond Purser    close follow up  His updated medication list for this problem includes:    Paxil 40 Mg Tabs (Paroxetine hcl) .Marland Kitchen... Take 1& 1/2  tablet by mouth once a day for depression and anxiety    Clonazepam 2 Mg Tabs (Clonazepam) .Marland Kitchen... Take 1 tablet by mouth three times a day as needed    Buspirone Hcl 15 Mg Tabs (Buspirone hcl) .Marland Kitchen... Take one by mouth three times a day  Orders: Psychology Referral (Psychology)  Problem # 2:  OTHER SYMPTOMS INVOLVING DIGESTIVE SYSTEM OTHER (ICD-787.99) Assessment: New change in bowels  will try to set up colon at next visit--currently he isn't ready  Complete Medication List: 1)  Lovastatin 40 Mg Tabs (Lovastatin) .... Take 1 tablet by mouth once a day 2)  Levoxyl 75 Mcg Tabs (Levothyroxine sodium) .... Take 1 tablet by mouth  once a day 3)  Lisinopril 10 Mg Tabs (Lisinopril) .... Take 1 tablet by mouth once a day 4)  Glucophage 500 Mg Tabs (Metformin hcl) .... Take 1 tablet by mouth before breakfast and lunch and 2 before supper 5)  Paxil 40 Mg Tabs (Paroxetine hcl) .... Take 1& 1/2  tablet by mouth once a day for depression and anxiety 6)  Clonazepam 2 Mg Tabs (Clonazepam) .... Take 1 tablet by mouth three times a day as needed 7)  Buspirone Hcl 15 Mg Tabs (Buspirone hcl) .... Take one by mouth three times a  day 8)  Zyprexa 10 Mg Tabs (Olanzapine) .Marland Kitchen.. 1 at bedtime for severe depression by mouth 9)  Glipizide 5 Mg Tabs (Glipizide) .Marland Kitchen.. 1 tab by mouth two times a day before breakfast and supper 10)  Methylphenidate Hcl 10 Mg Tabs (Methylphenidate hcl) .Marland Kitchen.. 1 tab by mouth two times a day for depression  Patient Instructions: 1)  Please keep appt on Sept 23rd at 11:15 2)  Please set up appt with Dr Laymond Purser  Prescriptions: METHYLPHENIDATE HCL 10 MG TABS (METHYLPHENIDATE HCL) 1 tab by mouth two times a day for depression  #60 x 0   Entered and Authorized by:   Cindee Salt MD   Signed by:   Cindee Salt MD on 05/07/2010   Method used:   Print then Give to Patient   RxID:   [id number]   Current Allergies (reviewed today): ! VYTORIN ! AUGMENTIN  He had plenty of the 5mg  ritalin so I destroyed the script and he will take 2 two times a day for now

## 2010-09-29 NOTE — Progress Notes (Signed)
Summary: regarding appeal for ritalin  Phone Note Other Incoming   Summary of Call: Spoke with Theodoro Grist, a Production designer, theatre/television/film at [employer] regarding pt's ritalin script.  If you still want to speak with someone there he gave me the name of Jackquline Denmark, manager in the coverage review department- phone [phone].  He said to give the pt's case number, which is [id number].  He said the only way to get a longer approval period on the ritalin is to appeal to state [employer] [place] pharmacy appeals coordinator. Post office  [id number], [place] [id number]. Initial call taken by: Lowella Petties CMA,  February 15, 2010 4:40 PM  Follow-up for Phone Call        Letter written Follow-up by: Cindee Salt MD,  February 16, 2010 7:56 AM

## 2010-09-29 NOTE — Letter (Signed)
Summary: [facility]: Immunoassay Fecal Occult Blood (iFOB) Order Form  [place] at [facility]  [address] [place], [place] [address]   Phone: [phone]  Fax: [phone]      [facility]: Immunoassay Fecal Occult Blood (iFOB) Order Form   September 23, 2009 MRN: [id number]   Randy Mcdaniel 08-21-1941   Physicican Name:______Letvak___________________  Diagnosis Code:________V76.51__________________      Cindee Salt MD

## 2010-09-29 NOTE — Assessment & Plan Note (Signed)
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 Vytorin 2)  ! Augmentin  Past History:  Past medical, surgical, family and social histories (including risk factors) reviewed for relevance to current acute and chronic problems.  Past Medical History: Reviewed history from 09/12/2008 and no changes required. Anxiety Depression--recurrent and severe Diabetes mellitus, type II with nephropathy Hyperlipidemia Hypothyroidism Osteoarthritis  Past Surgical History: Reviewed history from 03/27/2009 and no changes required. umbical herniorrhaphy  2000 Right TKR--Dr Remer Macho ([facility]) 11/09 Left TKR---Dr Claretta Fraise        6/10  Family History: Reviewed history from 02/15/2007 and no changes required. Dad died @77   CHF, DM, depression Mom died @82  sepsis 1 sister--breast cancer, ??lung cancer, depression CAD in Dad No prostate or colon cancer Strokes on Dad's side Cousin with Parkinson's  Social History: Reviewed history from 12/09/2009 and no changes required. 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  NAD Neurologic:  alert & oriented X3.   Slightly shuffling gait  Normal tone no bradykinesia or tremor Psych:   normally interactive, good eye contact, and dysphoric affect.     Impression & Recommendations:  Problem # 1:  DEPRESSION (ICD-311) Assessment Unchanged ongoing seems to be mostly an adjustment issue with retirement again spoke about finding fulfillment Can't sit and sleep so much Counselled more than half of 20 minute visit  His updated medication list for this problem includes:    Paxil 40 Mg Tabs (Paroxetine hcl) .Marland Kitchen... Take 1& 1/2  tablet by mouth once a day for depression and anxiety    Clonazepam 2 Mg Tabs (Clonazepam) .Marland Kitchen... Take 1 tablet by mouth three times a day as needed    Buspirone Hcl 15 Mg Tabs (Buspirone hcl) .Marland Kitchen... Take one by mouth three times a day  Complete Medication List: 1)  Lovastatin 40 Mg Tabs (Lovastatin) .... Take 1 tablet by mouth once a day 2)  Levoxyl 75 Mcg Tabs (Levothyroxine sodium) .... Take 1 tablet by mouth once a day 3)  Lisinopril 10 Mg Tabs (Lisinopril) .... Take 1 tablet by mouth once a day 4)  Glucophage 500 Mg Tabs (Metformin hcl) .... Take 1 tablet by mouth before breakfast and lunch and 2 before supper 5)  Paxil 40 Mg Tabs (Paroxetine hcl) .... Take 1& 1/2  tablet by mouth once a day for depression and anxiety 6)  Clonazepam 2 Mg Tabs (Clonazepam) .... Take 1 tablet by mouth three times a day as needed 7)  Buspirone Hcl 15 Mg Tabs (Buspirone hcl) .... Take one by mouth  three times a day 8)  Diclofenac Sodium 75 Mg Tbec (Diclofenac sodium) .... Take one by mouth two times a day 9)  Zyprexa 10 Mg Tabs (Olanzapine) .Marland Kitchen.. 1 at bedtime for severe depression by mouth 10)  Methylphenidate Hcl 5 Mg Tabs (Methylphenidate hcl) .Marland Kitchen.. 1 two times a day as directed for depression by mouth 11)  Glipizide 5 Mg Tabs (Glipizide) .Marland Kitchen.. 1 tab by mouth two times a day before breakfast and supper  Patient Instructions: 1)  Keep July 29th appt 2)  Please spend no more than 9 hours in bed each night  Current Allergies (reviewed today): ! VYTORIN ! AUGMENTIN

## 2010-09-29 NOTE — Letter (Signed)
Summary: Results Follow up Letter  [place] at [facility]  [address] [place], [place] [address]   Phone: [phone]  Fax: [phone]    10/05/2009 MRN: [id number]  Randy Mcdaniel [address] [place], [place]  [address]  Dear Randy Mcdaniel,  The following are the results of your recent test(s):  Test         Result    Pap Smear:        Normal _____  Not Normal _____ Comments: ______________________________________________________ Cholesterol: LDL(Bad cholesterol):         Your goal is less than:         HDL (Good cholesterol):       Your goal is more than: Comments:  ______________________________________________________ Mammogram:        Normal _____  Not Normal _____ Comments:  ___________________________________________________________________ Hemoccult:        Normal __X___  Not normal _______ Comments:  Stool test negative for blood. We will repeat this next year  _____________________________________________________________________ Other Tests:    We routinely do not discuss normal results over the telephone.  If you desire a copy of the results, or you have any questions about this information we can discuss them at your next office visit.   Sincerely,      Tillman Abide, MD

## 2010-09-29 NOTE — Progress Notes (Signed)
Summary: refill request for ritalin  Phone Note Refill Request Call back at Home Phone [phone] Message from:  Patient  Refills Requested: Medication #1:  METHYLPHENIDATE HCL 5 MG TABS 1 two times a day as directed for depression by mouth Please call when ready.  Initial call taken by: Lowella Petties CMA,  February 02, 2010 10:53 AM  Follow-up for Phone Call        Rx written Follow-up by: Cindee Salt MD,  February 02, 2010 2:04 PM  Additional Follow-up for Phone Call Additional follow up Details #1::        Spoke with patient and advised rx ready for pick-up  Additional Follow-up by: Mervin Hack CMA Duncan Dull),  February 02, 2010 2:15 PM    Prescriptions: METHYLPHENIDATE HCL 5 MG TABS (METHYLPHENIDATE HCL) 1 two times a day as directed for depression by mouth  #60 x 0   Entered and Authorized by:   Cindee Salt MD   Signed by:   Cindee Salt MD on 02/02/2010   Method used:   Print then Give to Patient   RxID:   [id number]

## 2010-09-29 NOTE — Assessment & Plan Note (Signed)
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 Vytorin 2)  ! Augmentin  Past History:  Past medical, surgical, family and social histories (including risk factors) reviewed for relevance to current acute and chronic problems.  Past Medical History: Reviewed history from 09/12/2008 and no changes required. Anxiety Depression--recurrent and severe Diabetes mellitus, type II with nephropathy Hyperlipidemia Hypothyroidism Osteoarthritis  Past Surgical History: Reviewed history from 03/27/2009 and no changes required. umbical herniorrhaphy  2000 Right TKR--Dr Remer Macho ([facility]) 11/09 Left TKR---Dr Claretta Fraise        6/10  Family History: Reviewed history from 02/15/2007 and no changes required. 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   Psych:  normally interactive, dysphoric affect,  subdued, and slightly anxious.     Impression & Recommendations:  Problem # 1:  ANXIETY (ICD-300.00) Assessment Deteriorated  mild worsening but seems more flat and having adjustment problems with his retirement  His updated medication list for this problem includes:    Paxil 40 Mg Tabs (Paroxetine hcl) .Marland Kitchen... Take 1& 1/2  tablet by mouth once a day for depression and anxiety    Clonazepam 2 Mg Tabs (Clonazepam) .Marland Kitchen... Take 1 tablet by mouth three times a day as needed    Buspirone Hcl 15 Mg Tabs (Buspirone hcl) .Marland Kitchen... Take one by mouth three times a day  Orders: Prescription Created Electronically [phone])  Problem # 2:  DEPRESSION (ICD-311) Assessment: Deteriorated clearly more anhedonic off the ritalin will restart and check again soon discussed the need to be fulfilled in retirement  His updated medication list for this problem includes:    Paxil 40 Mg Tabs (Paroxetine hcl) .Marland Kitchen... Take 1& 1/2  tablet by mouth once a day for depression and anxiety    Clonazepam 2 Mg Tabs (Clonazepam) .Marland Kitchen... Take 1 tablet by mouth three times a day as needed    Buspirone Hcl 15 Mg Tabs (Buspirone hcl) .Marland Kitchen... Take one by mouth three times a day  Complete Medication List: 1)  Lovastatin 40 Mg Tabs (Lovastatin) .... Take 1 tablet by mouth once a day 2)  Levoxyl 75 Mcg Tabs (Levothyroxine sodium) .... Take 1 tablet by mouth once a day 3)  Lisinopril 10 Mg Tabs (Lisinopril) .... Take 1 tablet by mouth once a day 4)  Glucophage  500 Mg Tabs (Metformin hcl) .... Take 1 tablet by mouth before breakfast and lunch and 2 before supper 5)  Paxil 40 Mg Tabs (Paroxetine hcl) .... Take 1& 1/2  tablet by mouth once a day for depression and anxiety 6)  Clonazepam 2 Mg Tabs (Clonazepam) .... Take 1 tablet by mouth three times a day as needed 7)  Buspirone Hcl 15 Mg Tabs (Buspirone hcl) .... Take one by mouth three times a day 8)  Diclofenac Sodium 75 Mg Tbec (Diclofenac sodium) .... Take one by mouth two times a day 9)   Zyprexa 10 Mg Tabs (Olanzapine) .Marland Kitchen.. 1 at bedtime for severe depression by mouth 10)  Methylphenidate Hcl 5 Mg Tabs (Methylphenidate hcl) .Marland Kitchen.. 1 two times a day as directed for depression by mouth 11)  Glipizide 5 Mg Tabs (Glipizide) .Marland Kitchen.. 1 tab by mouth two times a day before breakfast and supper 12)  Januvia 50 Mg Tabs (Sitagliptin phosphate) .Marland Kitchen.. 1 tab daily for diabetes  Patient Instructions: 1)  Please schedule a follow-up appointment in 1 month.  Prescriptions: BUSPIRONE HCL 15 MG TABS (BUSPIRONE HCL) Take one by mouth three times a day  #90 x 11   Entered and Authorized by:   Cindee Salt MD   Signed by:   Cindee Salt MD on 12/09/2009   Method used:   Electronically to        [facility]* (retail)       [address] [place], [place]  [address]       Ph: [phone]       Fax: [phone]   RxID:   [id number] BUSPIRONE HCL 15 MG TABS (BUSPIRONE HCL) Take one by mouth three times a day  #90 x 11   Entered and Authorized by:   Cindee Salt MD   Signed by:   Cindee Salt MD on 12/09/2009   Method used:   Print then Give to Patient   RxID:   [id number] METHYLPHENIDATE HCL 5 MG TABS (METHYLPHENIDATE HCL) 1 two times a day as directed for depression by mouth  #60 x 0   Entered and Authorized by:   Cindee Salt MD   Signed by:   Cindee Salt MD on 12/09/2009   Method used:   Print then Give to Patient   RxID:   [id number]   Current Allergies (reviewed today): ! VYTORIN ! AUGMENTIN

## 2010-09-29 NOTE — Progress Notes (Signed)
Summary: pt requests phone call  Phone Note Call from Patient Call back at Home Phone [phone]   Caller: Patient Call For: Cindee Salt MD Summary of Call: Pt is asking that you call him to discuss his anxiety.  He says he has taken all of his medicine and it didnt help. Initial call taken by: Lowella Petties CMA,  May 06, 2010 4:38 PM  Follow-up for Phone Call        has had some good times then may get hit like a ton of bricks. Some thoughts of suicide but no apparent acute ideation  discussed adding counselling he will come in at 3:30Pm today and we will review and make decisions Follow-up by: Cindee Salt MD,  May 07, 2010 8:03 AM

## 2010-09-29 NOTE — Progress Notes (Signed)
Summary: regarding appts  Phone Note Call from Patient Call back at Home Phone [phone]   Caller: Patient Call For: Cindee Salt MD Summary of Call: Pt had first appt with psych this morning.  He has another appt set up for 9/20 and a follow up with you on 9/23.  He doesnt want to come here twice in one week.  He is asking if he needs to keep both appts. Initial call taken by: Lowella Petties CMA,  May 11, 2010 2:54 PM  Follow-up for Phone Call        Okay to push back my appt by a couple of weeks Important that he keep follow up with Dr Laymond Purser Follow-up by: Cindee Salt MD,  May 11, 2010 8:03 PM  Additional Follow-up for Phone Call Additional follow up Details #1::        Patient scheduled appt. on 06/02/10 @ 11:15. Additional Follow-up by: Beau Fanny,  May 12, 2010 8:17 AM

## 2010-09-29 NOTE — Medication Information (Signed)
Summary: [employer] Prior Auth Approval for Methylphenidate Hcl Tablet from 5/  [employer] Prior Auth Approval for Methylphenidate Hcl Tablet from 01/13/10-03/17/10   Imported By: Beau Fanny 02/04/2010 14:52:50  _____________________________________________________________________  External Attachment:    Type:   Image     Comment:   External Document

## 2010-09-29 NOTE — Assessment & Plan Note (Signed)
Summary: 8:15 6 MONTH FOLLOW UP/[facility]H   Vital Signs:  Patient profile:   70 year old male Weight:      232 pounds BMI:     31.58 Temp:     98.6 degrees F oral Pulse rate:   108 / minute Pulse rhythm:   regular BP sitting:   146 / 80  (left arm) Cuff size:   large  Vitals Entered By: Mervin Hack CMA (AAMA) (September 23, 2009 8:00 AM) CC: 6 month follow-up   History of Present Illness: Doing fairly well  Sugars up and down checks daily--usually in afternoon 100-180 No hypoglycemic reactions due for eye exam  Mood has been fairly good no sig depression  No chest pain No SOB No edema  no sig arthritis pain  Allergies: 1)  ! Vytorin 2)  ! Augmentin  Past History:  Past medical, surgical, family and social histories (including risk factors) reviewed for relevance to current acute and chronic problems.  Past Medical History: Reviewed history from 09/12/2008 and no changes required. Anxiety Depression--recurrent and severe Diabetes mellitus, type II with nephropathy Hyperlipidemia Hypothyroidism Osteoarthritis  Past Surgical History: Reviewed history from 03/27/2009 and no changes required. umbical herniorrhaphy  2000 Right TKR--Dr Remer Macho ([facility]) 11/09 Left TKR---Dr Claretta Fraise        6/10  Family History: Reviewed history from 02/15/2007 and no changes required. Dad died @77   CHF, DM, depression Mom died @82  sepsis 1 sister--breast cancer, ??lung cancer, depression CAD in Dad No prostate or colon cancer Strokes on Dad's side Cousin with Parkinson's  Social History: Reviewed history from 02/15/2007 and no changes required. Occupation: does Teacher, English as a foreign language daughters Never Smoked but does chew Alcohol use-no  Review of Systems       Sleeps well APpetite is "too good" Weight up 9#---discussed Has done some walking--hard with the bad weather  Physical Exam  General:  alert and normal appearance.   Neck:  supple, no masses,  no thyromegaly, no carotid bruits, and no cervical lymphadenopathy.   Lungs:  normal respiratory effort and normal breath sounds.   Heart:  normal rate, regular rhythm, no murmur, and no gallop.   Msk:  no joint tenderness and no joint swelling.   Pulses:  faint in feet Extremities:  no edema Neurologic:  alert & oriented X3, strength normal in all extremities, and gait normal.   Skin:  no suspicious lesions and no ulcerations.   Psych:  normally interactive, good eye contact, not anxious appearing, and not depressed appearing.    Diabetes Management Exam:    Foot Exam (with socks and/or shoes not present):       Sensory-Pinprick/Light touch:          Left medial foot (L-4): normal          Left dorsal foot (L-5): normal          Left lateral foot (S-1): normal          Right medial foot (L-4): normal          Right dorsal foot (L-5): normal          Right lateral foot (S-1): normal       Inspection:          Left foot: abnormal             Comments: dry skin          Right foot: abnormal             Comments:  dry skin       Nails:          Left foot: thickened          Right foot: fungal infection   Impression & Recommendations:  Problem # 1:  DIABETES MELLITUS, TYPE II, WITH RENAL COMPLICATIONS (ICD-250.40) Assessment Unchanged  seems to have reasonable control will check labs  His updated medication list for this problem includes:    Lisinopril 10 Mg Tabs (Lisinopril) .Marland Kitchen... Take 1 tablet by mouth once a day    Glucophage 500 Mg Tabs (Metformin hcl) .Marland Kitchen... Take 1 tablet by mouth before breakfast and lunch and 2 before supper    Glipizide 5 Mg Tabs (Glipizide) .Marland Kitchen... 1 tab by mouth two times a day before breakfast and supper    Januvia 50 Mg Tabs (Sitagliptin phosphate) .Marland Kitchen... 1 tab daily for diabetes  Labs Reviewed: Creat: 1.2 (03/27/2009)     Last Eye Exam: normal (05/12/2008) Reviewed HgBA1c results: 7.0 (03/27/2009)  7.9 (12/26/2008)  Orders: Venipuncture  (04540) TLB-Renal Function Panel (80069-RENAL) TLB-CBC Platelet - w/Differential (85025-CBCD) TLB-A1C / Hgb A1C (Glycohemoglobin) (83036-A1C) TLB-TSH (Thyroid Stimulating Hormone) (84443-TSH)  Problem # 2:  DEPRESSION (ICD-311) Assessment: Unchanged  severe and life long needs to continue aggressive regimen including zyprexa  His updated medication list for this problem includes:    Paxil 40 Mg Tabs (Paroxetine hcl) .Marland Kitchen... Take 1& 1/2  tablet by mouth once a day for depression and anxiety    Clonazepam 2 Mg Tabs (Clonazepam) .Marland Kitchen... Take 1 tablet by mouth three times a day as needed    Buspirone Hcl 15 Mg Tabs (Buspirone hcl) .Marland Kitchen... Take one by mouth three times a day  Orders: TLB-TSH (Thyroid Stimulating Hormone) (84443-TSH)  Problem # 3:  HYPERLIPIDEMIA (ICD-272.4) Assessment: Unchanged  control adequate will recheck  His updated medication list for this problem includes:    Lovastatin 40 Mg Tabs (Lovastatin) .Marland Kitchen... Take 1 tablet by mouth once a day  Labs Reviewed: SGOT: 30 (03/27/2009)   SGPT: 18 (03/27/2009)   HDL:42.00 (03/27/2009), 37.90 (12/26/2008)  LDL:DEL (05/02/2008), DEL (09/27/2007)  Chol:215 (03/27/2009), 215 (12/26/2008)  Trig:317.0 (03/27/2009), 201.0 (12/26/2008)  Orders: TLB-Lipid Panel (80061-LIPID) TLB-Hepatic/Liver Function Pnl (80076-HEPATIC)  Problem # 4:  HYPOTHYROIDISM (ICD-244.9) Assessment: Unchanged  euthyroid will confirm with labs  His updated medication list for this problem includes:    Levoxyl 75 Mcg Tabs (Levothyroxine sodium) .Marland Kitchen... Take 1 tablet by mouth once a day  Labs Reviewed: TSH: 3.99 (03/27/2009)    HgBA1c: 7.0 (03/27/2009) Chol: 215 (03/27/2009)   HDL: 42.00 (03/27/2009)   LDL: DEL (05/02/2008)   TG: 317.0 (03/27/2009)  Orders: TLB-T4 (Thyrox), Free [phone]) TLB-TSH (Thyroid Stimulating Hormone) (84443-TSH)  Problem # 5:  OSTEOARTHRITIS (ICD-715.90) Assessment: Unchanged  doing well now  His updated medication list  for this problem includes:    Diclofenac Sodium 75 Mg Tbec (Diclofenac sodium) .Marland Kitchen... Take one by mouth two times a day  Orders: TLB-TSH (Thyroid Stimulating Hormone) (84443-TSH)  Complete Medication List: 1)  Lovastatin 40 Mg Tabs (Lovastatin) .... Take 1 tablet by mouth once a day 2)  Levoxyl 75 Mcg Tabs (Levothyroxine sodium) .... Take 1 tablet by mouth once a day 3)  Lisinopril 10 Mg Tabs (Lisinopril) .... Take 1 tablet by mouth once a day 4)  Glucophage 500 Mg Tabs (Metformin hcl) .... Take 1 tablet by mouth before breakfast and lunch and 2 before supper 5)  Paxil 40 Mg Tabs (Paroxetine hcl) .... Take 1& 1/2  tablet by mouth  once a day for depression and anxiety 6)  Clonazepam 2 Mg Tabs (Clonazepam) .... Take 1 tablet by mouth three times a day as needed 7)  Buspirone Hcl 15 Mg Tabs (Buspirone hcl) .... Take one by mouth three times a day 8)  Diclofenac Sodium 75 Mg Tbec (Diclofenac sodium) .... Take one by mouth two times a day 9)  Zyprexa 10 Mg Tabs (Olanzapine) .Marland Kitchen.. 1 at bedtime for severe depression by mouth 10)  Methylphenidate Hcl 5 Mg Tabs (Methylphenidate hcl) .Marland Kitchen.. 1 two times a day as directed for depression by mouth 11)  Glipizide 5 Mg Tabs (Glipizide) .Marland Kitchen.. 1 tab by mouth two times a day before breakfast and supper 12)  Januvia 50 Mg Tabs (Sitagliptin phosphate) .Marland Kitchen.. 1 tab daily for diabetes  Other Orders: TLB-PSA (Prostate Specific Antigen) (84153-PSA)  Patient Instructions: 1)  Please schedule a follow-up appointment in 6 months .   Current Allergies (reviewed today): ! VYTORIN ! AUGMENTIN

## 2010-09-29 NOTE — Assessment & Plan Note (Signed)
Summary: FOLLOW UP   Vital Signs:  Patient profile:   70 year old male Weight:      230 pounds Temp:     98.7 degrees F oral Pulse rate:   84 / minute Pulse rhythm:   regular BP sitting:   120 / 82  (left arm) Cuff size:   large  Vitals Entered By: Janee Morn CMA (March 26, 2010 8:16 AM) CC: Follow up   History of Present Illness: Not doing so great Depression is really worse--currently not taking the methylphenidate  Prior authorization troubles have really messed him up Discussed our options Will try 3 month supply to [facility] and give him one here to fill now  Checks sugars about 1 every 1-2 weeks Mostly in the 120's, occ below 100 No sig hypoglycemia no foot pain or sores  No chest pain Occ SOB--- mostly when he gets worsening of his depression  No sig arthritis problems walking some 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 Vytorin 2)  ! Augmentin  Past History:  Past medical, surgical, family and social histories (including risk factors) reviewed for relevance to current acute and chronic problems.  Past Medical History: Reviewed history from 09/12/2008 and no changes required. Anxiety Depression--recurrent and severe Diabetes mellitus, type II with nephropathy Hyperlipidemia Hypothyroidism Osteoarthritis  Past Surgical History: Reviewed history from 03/27/2009 and no changes required. umbical herniorrhaphy  2000 Right TKR--Dr Remer Macho ([facility]) 11/09 Left TKR---Dr Claretta Fraise        6/10  Family History: Reviewed history from 02/15/2007 and no changes required. Dad died @77   CHF, DM, depression Mom died @82  sepsis 1 sister--breast cancer, ??lung cancer, depression CAD in Dad No prostate or colon cancer Strokes on Dad's side Cousin with Parkinson's  Social History: Reviewed history from 12/09/2009 and no changes required. 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   Neck:  supple, no masses, no thyromegaly, no carotid bruits, and no cervical lymphadenopathy.   Lungs:  normal respiratory effort, no intercostal retractions, no accessory muscle use, and normal breath sounds.   Heart:  normal rate, regular rhythm, no murmur, and no gallop.   Msk:  no joint tenderness and no joint swelling.   stiffness in knees Pulses:  faint on right, absent on left Extremities:  no edema Psych:  normally interactive, good eye contact, and dysphoric affect.    Diabetes Management Exam:    Foot Exam (with socks and/or shoes not present):       Sensory-Pinprick/Light touch:          Left medial foot (L-4): normal          Left dorsal foot (L-5): normal          Left lateral foot (S-1): normal          Right medial foot (L-4): normal          Right dorsal foot (L-5): normal          Right lateral foot (S-1): normal       Inspection:          Left foot: abnormal             Comments: early callous  Right foot: abnormal             Comments: early callous       Nails:          Left foot: fungal infection          Right foot: fungal infection   Impression & Recommendations:  Problem # 1:  DEPRESSION (ICD-311) Assessment Deteriorated really needs to be back on the methylphenidate  His updated medication list for this problem includes:    Paxil 40 Mg Tabs (Paroxetine hcl) .Marland Kitchen... Take 1& 1/2  tablet by mouth once a day for depression and anxiety    Clonazepam 2 Mg Tabs (Clonazepam) .Marland Kitchen... Take 1 tablet by mouth three times a day as needed    Buspirone Hcl 15 Mg Tabs (Buspirone hcl) .Marland Kitchen... Take one by mouth three times a day  Problem # 2:  DIABETES MELLITUS, TYPE II, WITH RENAL COMPLICATIONS (ICD-250.40) Assessment: Unchanged  seems to have good control will check labs  His updated medication  list for this problem includes:    Lisinopril 10 Mg Tabs (Lisinopril) .Marland Kitchen... Take 1 tablet by mouth once a day    Glucophage 500 Mg Tabs (Metformin hcl) .Marland Kitchen... Take 1 tablet by mouth before breakfast and lunch and 2 before supper    Glipizide 5 Mg Tabs (Glipizide) .Marland Kitchen... 1 tab by mouth two times a day before breakfast and supper  Labs Reviewed: Creat: 1.2 (09/23/2009)     Last Eye Exam: No diabetic retinopathy.   GLaucoma suspect (03/04/2010) Reviewed HgBA1c results: 7.0 (09/23/2009)  7.0 (03/27/2009)  Orders: TLB-Renal Function Panel (80069-RENAL) TLB-CBC Platelet - w/Differential (85025-CBCD) TLB-Microalbumin/Creat Ratio, Urine (82043-MALB) TLB-A1C / Hgb A1C (Glycohemoglobin) (83036-A1C)  Problem # 3:  HYPERLIPIDEMIA (ICD-272.4) Assessment: Unchanged  no problems with meds will recheck labs now  His updated medication list for this problem includes:    Lovastatin 40 Mg Tabs (Lovastatin) .Marland Kitchen... Take 1 tablet by mouth once a day  Labs Reviewed: SGOT: 29 (09/23/2009)   SGPT: 21 (09/23/2009)   HDL:43.90 (09/23/2009), 42.00 (03/27/2009)  LDL:97 (09/23/2009), DEL (93/81/8299)  Chol:177 (09/23/2009), 215 (03/27/2009)  Trig:182.0 (09/23/2009), 317.0 (03/27/2009)  Orders: TLB-Lipid Panel (80061-LIPID) TLB-Hepatic/Liver Function Pnl (80076-HEPATIC) Venipuncture (37169)  Problem # 4:  HYPOTHYROIDISM (ICD-244.9) Assessment: Unchanged seems to be euthyroid labs next time  His updated medication list for this problem includes:    Levoxyl 75 Mcg Tabs (Levothyroxine sodium) .Marland Kitchen... Take 1 tablet by mouth once a day  Labs Reviewed: TSH: 4.67 (09/23/2009)    HgBA1c: 7.0 (09/23/2009) Chol: 177 (09/23/2009)   HDL: 43.90 (09/23/2009)   LDL: 97 (09/23/2009)   TG: 182.0 (09/23/2009)  Problem # 5:  OSTEOARTHRITIS (ICD-715.90) Assessment: Comment Only stiff knees but no pain problems  The following medications were removed from the medication list:    Diclofenac Sodium 75 Mg Tbec  (Diclofenac sodium) .Marland Kitchen... Take one by mouth two times a day  Complete Medication List: 1)  Lovastatin 40 Mg Tabs (Lovastatin) .... Take 1 tablet by mouth once a day 2)  Levoxyl 75 Mcg Tabs (Levothyroxine sodium) .... Take 1 tablet by mouth once a day 3)  Lisinopril 10 Mg Tabs (Lisinopril) .... Take 1 tablet by mouth once a day 4)  Glucophage 500 Mg Tabs (Metformin hcl) .... Take 1 tablet by mouth before breakfast and lunch and 2 before supper 5)  Paxil 40 Mg Tabs (Paroxetine hcl) .... Take 1& 1/2  tablet by mouth once a day for depression and anxiety 6)  Clonazepam 2 Mg Tabs (  Clonazepam) .... Take 1 tablet by mouth three times a day as needed 7)  Buspirone Hcl 15 Mg Tabs (Buspirone hcl) .... Take one by mouth three times a day 8)  Zyprexa 10 Mg Tabs (Olanzapine) .Marland Kitchen.. 1 at bedtime for severe depression by mouth 9)  Methylphenidate Hcl 5 Mg Tabs (Methylphenidate hcl) .Marland Kitchen.. 1 two times a day as directed for depression by mouth 10)  Glipizide 5 Mg Tabs (Glipizide) .Marland Kitchen.. 1 tab by mouth two times a day before breakfast and supper  Patient Instructions: 1)  Please call within the next 2 weeks if your depression is not better back on the methylphenidate 2)  Please schedule a follow-up appointment in 6 weeks.  Prescriptions: METHYLPHENIDATE HCL 5 MG TABS (METHYLPHENIDATE HCL) 1 two times a day as directed for depression by mouth  #180 x 0   Entered and Authorized by:   Cindee Salt MD   Signed by:   Cindee Salt MD on 03/26/2010   Method used:   Print then Give to Patient   RxID:   [id number] METHYLPHENIDATE HCL 5 MG TABS (METHYLPHENIDATE HCL) 1 two times a day as directed for depression by mouth  #60 x 0   Entered and Authorized by:   Cindee Salt MD   Signed by:   Cindee Salt MD on 03/26/2010   Method used:   Print then Give to Patient   RxID:   [id number]   Current Allergies (reviewed today): ! VYTORIN ! AUGMENTIN

## 2010-09-29 NOTE — Letter (Signed)
Summary: [facility]   Imported By: Lester [place] 03/11/2010 12:44:32  _____________________________________________________________________  External Attachment:    Type:   Image     Comment:   External Document  Appended Document: [facility]    Clinical Lists Changes  Observations: Added new observation of DIAB EYE EX: No diabetic retinopathy.   GLaucoma suspect (03/04/2010 22:19)       Diabetic Eye Exam  Procedure date:  03/04/2010  Findings:      No diabetic retinopathy.   GLaucoma suspect

## 2010-09-29 NOTE — Progress Notes (Signed)
Summary: METHYLPHENIDATE HCL 10 MG   Phone Note Refill Request Call back at Home Phone [phone] Message from:  Fax from Pharmacy on July 14, 2010 2:52 PM  Refills Requested: Medication #1:  METHYLPHENIDATE HCL 10 MG TABS 1 tab by mouth two times a day for depression. Please call patient when rx is ready.   Initial call taken by: Melody Comas,  July 14, 2010 2:53 PM  Follow-up for Phone Call        Rx written Follow-up by: Cindee Salt MD,  July 15, 2010 1:57 PM  Additional Follow-up for Phone Call Additional follow up Details #1::        left message with mother in-law that rx ready for pick-up  Additional Follow-up by: DeShannon Katrinka Blazing CMA [facility]),  July 15, 2010 2:54 PM    Prescriptions: METHYLPHENIDATE HCL 10 MG TABS (METHYLPHENIDATE HCL) 1 tab by mouth two times a day for depression  #60 x 0   Entered and Authorized by:   Cindee Salt MD   Signed by:   Cindee Salt MD on 07/15/2010   Method used:   Print then Give to Patient   RxID:   [id number]

## 2010-09-29 NOTE — Progress Notes (Signed)
Summary: refill request for clonazepam  Phone Note Refill Request Message from:  Fax from Pharmacy  Refills Requested: Medication #1:  CLONAZEPAM 2 MG  TABS Take 1 tablet by mouth three times a day as needed   Last Refilled: 12/09/2009 Faxed request from [facility] is on your desk.  Initial call taken by: Lowella Petties CMA,  Jan 26, 2010 11:08 AM  Follow-up for Phone Call        okay #90 x 2 Follow-up by: Cindee Salt MD,  Jan 26, 2010 2:18 PM  Additional Follow-up for Phone Call Additional follow up Details #1::        Rx faxed to pharmacy Additional Follow-up by: DeShannon Smith CMA [facility]),  Jan 26, 2010 3:30 PM    Prescriptions: CLONAZEPAM 2 MG  TABS (CLONAZEPAM) Take 1 tablet by mouth three times a day as needed  #90 x 2   Entered by:   Mervin Hack CMA (AAMA)   Authorized by:   Cindee Salt MD   Signed by:   Mervin Hack CMA (AAMA) on 01/26/2010   Method used:   Handwritten   RxID:   [id number]

## 2010-09-30 NOTE — Progress Notes (Signed)
Summary: pt needs physical therapy  Phone Note From Other Clinic   Caller: Dr. Marjie Skiff at [facility] [phone] Summary of Call: Pt's psychiatrist is asking that pt be referred for physical therapy.  He says he is recovering from vitamin B12 deficiency and is deconditioned.  Psychiatrist thinks that pt would benefit from therapy.  I spoke with pt and he prefers to go to [facility].  Pt says he has lost a lot of weight, he says his legs feel so weak that he can hardly walk. Initial call taken by: Lowella Petties CMA, AAMA,  September 14, 2010 11:50 AM  Follow-up for Phone Call        okay to refer for [facility] unless he prefers other Follow-up by: Cindee Salt MD,  September 14, 2010 1:55 PM

## 2010-09-30 NOTE — Assessment & Plan Note (Signed)
Summary: 2 KW F/U DLO   Vital Signs:  Patient profile:   70 year old male Weight:      236 pounds Temp:     99.0 degrees F oral BP sitting:   150 / 70  (left arm) Cuff size:   large  Vitals Entered By: Mervin Hack CMA [facility]) (August 18, 2010 12:05 PM) CC: 2 week follow-up   History of Present Illness: has been on the B12 tablets for about 2 weeks may feel some different Doesn't notice a difference in ability to walk  Still has bad days and good days Psychiatrist told him to wean off the zyprexa----stopped it after about 1.5 weeks Got really bad so called to ask to restart it Psychiatrist did tell him to restart it (though apparently neurologist preferred he didn't)  Hopes to start ECT on study protocol  Still sleeps a lot In bed from 7PM to 7-8AM Not napping much in the day No active suicidal thoughts --just vague wondering if life is worthwhile Appetite still good  he has had some optimism with the [facility] visits looks forward to the possibility of other Rx and getting off some of the meds  still has Johnnette Gourd does enjoy time with his grandson Has been back to going to church  Allergies: 1)  ! Vytorin 2)  ! Augmentin  Past History:  Past medical, surgical, family and social histories (including risk factors) reviewed for relevance to current acute and chronic problems.  Past Medical History: Reviewed history from 09/12/2008 and no changes required. Anxiety Depression--recurrent and severe Diabetes mellitus, type II with nephropathy Hyperlipidemia Hypothyroidism Osteoarthritis  Past Surgical History: Reviewed history from 03/27/2009 and no changes required. umbical herniorrhaphy  2000 Right TKR--Dr Remer Macho ([facility]) 11/09 Left TKR---Dr Claretta Fraise        6/10  Family History: Reviewed history from 02/15/2007 and no changes required. Dad died @77   CHF, DM, depression Mom died @82  sepsis 1 sister--breast cancer, ??lung cancer, depression CAD  in Dad No prostate or colon cancer Strokes on Dad's side Cousin with Parkinson's  Social History: Reviewed history from 12/09/2009 and no changes required. 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  NAD Psych:  depressed affect and subdued.     Impression & Recommendations:  Problem # 1:  DEPRESSION (ICD-311) Assessment Unchanged ongoing problems may be starting ECT next month counselled most of 15 minute visit  His updated medication list for this problem includes:    Paxil 40 Mg Tabs (Paroxetine hcl) .Marland Kitchen... Take 1& 1/2  tablet by mouth once a day for depression and anxiety    Clonazepam 2 Mg Tabs (Clonazepam) .Marland Kitchen... Take 1 tablet by mouth three times a day as needed    Buspirone Hcl 15 Mg Tabs (Buspirone hcl) .Marland Kitchen... Take one by mouth three times a day  Problem # 2:  B12 DEFICIENCY (ICD-266.2) Assessment: New  wiil check levels If still low, will start injecitons  Orders: Venipuncture (16109) TLB-B12, Serum-Total ONLY (60454-U98)  Complete Medication List: 1)  Lovastatin 40 Mg Tabs (Lovastatin) .... Take 1 tablet by mouth once a day 2)  Levoxyl 75 Mcg Tabs (Levothyroxine sodium) .... Take 1 tablet by mouth once a day 3)  Lisinopril 10 Mg Tabs (Lisinopril) .... Take 1 tablet by mouth once a day 4)  Glucophage 500 Mg Tabs (Metformin hcl) .... Take 1 tablet by mouth before breakfast and lunch and 2 before supper  5)  Paxil 40 Mg Tabs (Paroxetine hcl) .... Take 1& 1/2  tablet by mouth once a day for depression and anxiety 6)  Clonazepam 2 Mg Tabs (Clonazepam) .... Take 1 tablet by mouth three times a day as needed 7)  Buspirone Hcl 15 Mg Tabs (Buspirone hcl) .... Take one by mouth three times a day 8)  Zyprexa 10 Mg Tabs (Olanzapine) .Marland Kitchen.. 1 at bedtime for severe depression by mouth 9)  Glipizide 5 Mg Tabs (Glipizide) .Marland Kitchen.. 1 tab by mouth two times a day before breakfast and  supper 10)  Methylphenidate Hcl 10 Mg Tabs (Methylphenidate hcl) .Marland Kitchen.. 1 tab by mouth two times a day for depression  Patient Instructions: 1)  Please schedule a follow-up appointment in 2 months.  Prescriptions: METHYLPHENIDATE HCL 10 MG TABS (METHYLPHENIDATE HCL) 1 tab by mouth two times a day for depression  #60 x 0   Entered and Authorized by:   Cindee Salt MD   Signed by:   Cindee Salt MD on 08/18/2010   Method used:   Print then Give to Patient   RxID:   [id number]    Orders Added: 1)  Est. Patient Level III [14782] 2)  Venipuncture [95621] 3)  TLB-B12, Serum-Total ONLY [30865-H84]    Current Allergies (reviewed today): ! VYTORIN ! AUGMENTIN

## 2010-10-06 NOTE — Miscellaneous (Signed)
Summary: [facility]   Imported By: Kassie Mends 10/01/2010 09:29:17  _____________________________________________________________________  External Attachment:    Type:   Image     Comment:   External Document

## 2010-10-06 NOTE — Progress Notes (Signed)
Summary: clonazepam   Phone Note Refill Request Message from:  Fax from Pharmacy on September 28, 2010 9:42 AM  Refills Requested: Medication #1:  CLONAZEPAM 2 MG  TABS Take 1 tablet by mouth three times a day as needed   Last Refilled: 08/27/2010 Refill request from [facility]. [phone]. Fax is on your desk.   Initial call taken by: Melody Comas,  September 28, 2010 9:43 AM  Follow-up for Phone Call        okay #90 x 2 Follow-up by: Cindee Salt MD,  September 28, 2010 10:10 AM  Additional Follow-up for Phone Call Additional follow up Details #1::        Rx faxed to pharmacy Additional Follow-up by: DeShannon Smith CMA Duncan Dull),  September 28, 2010 3:03 PM    Prescriptions: CLONAZEPAM 2 MG  TABS (CLONAZEPAM) Take 1 tablet by mouth three times a day as needed  #90 x 2   Entered by:   Mervin Hack CMA (AAMA)   Authorized by:   Cindee Salt MD   Signed by:   Mervin Hack CMA (AAMA) on 09/28/2010   Method used:   Handwritten   RxID:   [id number]

## 2010-10-18 ENCOUNTER — Encounter: Payer: Self-pay | Admitting: Internal Medicine

## 2010-10-20 ENCOUNTER — Telehealth: Payer: Self-pay | Admitting: Internal Medicine

## 2010-10-20 ENCOUNTER — Ambulatory Visit (INDEPENDENT_AMBULATORY_CARE_PROVIDER_SITE_OTHER): Payer: Medicare Other | Admitting: Internal Medicine

## 2010-10-20 ENCOUNTER — Encounter: Payer: Self-pay | Admitting: Internal Medicine

## 2010-10-20 DIAGNOSIS — F329 Major depressive disorder, single episode, unspecified: Secondary | ICD-10-CM

## 2010-10-20 DIAGNOSIS — F411 Generalized anxiety disorder: Secondary | ICD-10-CM

## 2010-10-20 DIAGNOSIS — E1129 Type 2 diabetes mellitus with other diabetic kidney complication: Secondary | ICD-10-CM

## 2010-10-26 NOTE — Assessment & Plan Note (Signed)
Summary: 2 MONTH FOLLOW UP   Vital Signs:  Patient profile:   70 year old male Weight:      229 pounds Temp:     98.0 degrees F oral Pulse rate:   110 / minute Pulse rhythm:   regular BP sitting:   107 / 75  (left arm) Cuff size:   large  Vitals Entered By: Mervin Hack CMA Duncan Dull) (October 20, 2010 8:29 AM) CC: follow-up   History of Present Illness: Has had sig med changes by psychiatrist at [facility] They have diagnosed him as primarily anxious with secondary depression Didn't qualify for ECT Now on imipramine and valium (and off clonazepam) Had some withdrawl off the clonazepam--just finishing the wean High dose valium--initially 40mg  a day but was too much--now taking 20mg   Still optimistic about the future with his psychiatric care still with nervous spells  Diabetes control seems a little worse sugars as high 267--random hasn't checked fasting sugars  No chest pain no SOB  Allergies: 1)  ! Vytorin 2)  ! Augmentin  Past History:  Past medical, surgical, family and social histories (including risk factors) reviewed for relevance to current acute and chronic problems.  Past Medical History: Reviewed history from 09/12/2008 and no changes required. Anxiety Depression--recurrent and severe Diabetes mellitus, type II with nephropathy Hyperlipidemia Hypothyroidism Osteoarthritis  Past Surgical History: Reviewed history from 03/27/2009 and no changes required. umbical herniorrhaphy  2000 Right TKR--Dr Remer Macho ([facility]) 11/09 Left TKR---Dr Claretta Fraise        6/10  Family History: Reviewed history from 02/15/2007 and no changes required. Dad died @77   CHF, DM, depression Mom died @82  sepsis 1 sister--breast cancer, ??lung cancer, depression CAD in Dad No prostate or colon cancer Strokes on Dad's side Cousin with Parkinson's  Social History: Reviewed history from 12/09/2009 and no changes required. 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   Neck:  supple, no masses, no thyromegaly, and no cervical lymphadenopathy.   Lungs:  normal respiratory effort, no intercostal retractions, no accessory muscle use, and normal breath sounds.   Heart:  normal rate, regular rhythm, no murmur, and no gallop.   Extremities:  no edema Psych:  not anxious appearing, flat affect, and subdued.     Impression & Recommendations:  Problem # 1:  ANXIETY (ICD-300.00) Assessment Improved psychiatrist believes this is the primary diagnosis he is doing well on the new and reduced regimen  The following medications were removed from the medication list:    Paxil 40 Mg Tabs (Paroxetine hcl) .Marland Kitchen... Take 1& 1/2  tablet by mouth once a day for depression and anxiety    Clonazepam 2 Mg Tabs (Clonazepam) .Marland Kitchen... Take 1 tablet by mouth three times a day as needed    Buspirone Hcl 15 Mg Tabs (Buspirone hcl) .Marland Kitchen... Take one by mouth three times a day His updated medication list for this problem includes:    Imipramine Hcl 10 Mg Tabs (Imipramine hcl) .Marland Kitchen... Take 1 by mouth in the morning and 2 by mouth at bedtime    Valium 10 Mg Tabs (Diazepam) .Marland Kitchen... Take 2 by mouth in the morning, 2 by mouth at lunch, 2 by mouth at bedtime  Problem # 2:  DIABETES MELLITUS, TYPE II, WITH RENAL COMPLICATIONS (  ICD-250.40) Assessment: Deteriorated  some higher sugars  I asked him to check fasting  His updated medication list for this problem includes:    Lisinopril 10 Mg Tabs (Lisinopril) .Marland Kitchen... Take 1 tablet by mouth once a day    Glucophage 500 Mg Tabs (Metformin hcl) .Marland Kitchen... Take 1 by mouth in the morning and 1 by mouth at dinner    Glipizide 5 Mg Tabs (Glipizide) .Marland Kitchen... 1 tab by mouth two times a day before breakfast and supper  Labs Reviewed: Creat:  1.2 (07/19/2010)     Last Eye Exam: No diabetic retinopathy.   GLaucoma suspect (03/04/2010) Reviewed HgBA1c results: 7.9 (07/19/2010)  7.7 (03/26/2010)  Problem # 3:  DEPRESSION (ICD-311) Assessment: Improved seems better now time will tell about this with the new meds  The following medications were removed from the medication list:    Paxil 40 Mg Tabs (Paroxetine hcl) .Marland Kitchen... Take 1& 1/2  tablet by mouth once a day for depression and anxiety    Clonazepam 2 Mg Tabs (Clonazepam) .Marland Kitchen... Take 1 tablet by mouth three times a day as needed    Buspirone Hcl 15 Mg Tabs (Buspirone hcl) .Marland Kitchen... Take one by mouth three times a day His updated medication list for this problem includes:    Imipramine Hcl 10 Mg Tabs (Imipramine hcl) .Marland Kitchen... Take 1 by mouth in the morning and 2 by mouth at bedtime    Valium 10 Mg Tabs (Diazepam) .Marland Kitchen... Take 2 by mouth in the morning, 2 by mouth at lunch, 2 by mouth at bedtime  Complete Medication List: 1)  Imipramine Hcl 10 Mg Tabs (Imipramine hcl) .... Take 1 by mouth in the morning and 2 by mouth at bedtime 2)  Valium 10 Mg Tabs (Diazepam) .... Take 2 by mouth in the morning, 2 by mouth at lunch, 2 by mouth at bedtime 3)  Lovastatin 40 Mg Tabs (Lovastatin) .... Take 1 tablet by mouth once a day 4)  Levoxyl 75 Mcg Tabs (Levothyroxine sodium) .... Take 1 tablet by mouth once a day 5)  Lisinopril 10 Mg Tabs (Lisinopril) .... Take 1 tablet by mouth once a day 6)  Glucophage 500 Mg Tabs (Metformin hcl) .... Take 1 by mouth in the morning and 1 by mouth at dinner 7)  Glipizide 5 Mg Tabs (Glipizide) .Marland Kitchen.. 1 tab by mouth two times a day before breakfast and supper 8)  Vitamin B-12 1000 Mcg Tabs (Cyanocobalamin) .... Take 1 by mouth once daily  Patient Instructions: 1)  Please schedule a follow-up appointment in 4 months .  Prescriptions: GLIPIZIDE 5 MG TABS (GLIPIZIDE) 1 tab by mouth two times a day before breakfast and supper  #90 x 3   Entered and Authorized by:   Cindee Salt MD   Signed by:   Cindee Salt MD on 10/20/2010   Method used:   Electronically to        [facility]* (retail)       [address] [place], [place]  [address]       Ph: [phone]       Fax: [phone]   RxID:   [id number] GLUCOPHAGE 500 MG  TABS (METFORMIN HCL) take 1 by mouth in the morning and 1 by mouth at dinner  #180 x 3   Entered and Authorized by:   Cindee Salt MD   Signed by:   Cindee Salt  MD on 10/20/2010   Method used:   Electronically to        [facility]* (retail)       [address] [place], [place]  [address]       Ph: [phone]       Fax: [phone]   RxID:   [id number] LISINOPRIL 10 MG  TABS (LISINOPRIL) Take 1 tablet by mouth once a day  #90 x 3   Entered and Authorized by:   Cindee Salt MD   Signed by:   Cindee Salt MD on 10/20/2010   Method used:   Electronically to        [facility]* (retail)       [address] [place], [place]  [address]       Ph: [phone]       Fax: [phone]   RxID:   [id number] LEVOXYL 75 MCG  TABS (LEVOTHYROXINE SODIUM) Take 1 tablet by mouth once a day  #90 x 3   Entered and Authorized by:   Cindee Salt MD   Signed by:   Cindee Salt MD on 10/20/2010   Method used:   Electronically to        [facility]* (retail)       [address] [place], [place]  [address]       Ph: [phone]       Fax: [phone]   RxID:   [id number] LOVASTATIN 40 MG  TABS (LOVASTATIN) Take 1 tablet by mouth once a day  #30 x 3   Entered and Authorized by:   Cindee Salt MD   Signed by:   Cindee Salt MD on 10/20/2010   Method used:   Electronically to        [facility]* (retail)       [address] [place], [place]  [address]       Ph: [phone]       Fax: [phone]    RxID:   [id number]    Orders Added: 1)  Est. Patient Level IV [[address]]    Current Allergies (reviewed today): ! VYTORIN ! AUGMENTIN

## 2010-10-26 NOTE — Progress Notes (Signed)
Summary: pt requests phone call  Phone Note Call from Patient Call back at Home Phone [phone]   Caller: Patient Call For: Cindee Salt MD Summary of Call: Pt is asking that you call him regarding his physical therapist- he wants to discuss her with you.              Lowella Petties CMA, AAMA  October 20, 2010 3:48 PM   Follow-up for Phone Call        Notes that his sugar was 258 this AM Wondered if his meds were causing it to go up This is not likely---off atypical antipsychotic  May need long acting insulin but will just watch for now  disciussed progress report from PT He feels they need to work more on ankle strength Follow-up by: Cindee Salt MD,  October 21, 2010 5:45 PM

## 2010-10-28 ENCOUNTER — Encounter: Payer: Self-pay | Admitting: Internal Medicine

## 2010-11-04 NOTE — Miscellaneous (Signed)
Summary: Progress Report/[facility]  Progress Report/[facility]   Imported By: Maryln Gottron 10/25/2010 10:59:08  _____________________________________________________________________  External Attachment:    Type:   Image     Comment:   External Document

## 2010-11-28 ENCOUNTER — Encounter: Payer: Self-pay | Admitting: Internal Medicine

## 2010-12-21 ENCOUNTER — Telehealth: Payer: Self-pay | Admitting: *Deleted

## 2010-12-21 NOTE — Telephone Encounter (Signed)
Spoke with patient and advised results   

## 2010-12-21 NOTE — Telephone Encounter (Signed)
Pt had been in hospital at [facility] and was given oxcarbazepine for panic attacks when discharged.  His blood sugar has been running high since starting this.  Has been between 140-180 fasting and he says his normal is around 90-130.  Please advise on whether you think the medicine could be causing his blood sugar to go up.

## 2010-12-21 NOTE — Telephone Encounter (Signed)
It is unlikely that oxcarbazine would cause elevation of his blood sugar Just have him continue to monitor for now Have him set up appt in next couple of weeks if sugar won't go down

## 2011-01-05 ENCOUNTER — Ambulatory Visit: Payer: BC Managed Care – PPO | Admitting: Internal Medicine

## 2011-01-10 ENCOUNTER — Encounter: Payer: Self-pay | Admitting: Family Medicine

## 2011-01-10 ENCOUNTER — Encounter: Payer: Self-pay | Admitting: Internal Medicine

## 2011-01-10 ENCOUNTER — Telehealth: Payer: Self-pay | Admitting: *Deleted

## 2011-01-10 ENCOUNTER — Ambulatory Visit (INDEPENDENT_AMBULATORY_CARE_PROVIDER_SITE_OTHER): Payer: Medicare Other | Admitting: Family Medicine

## 2011-01-10 VITALS — BP 126/76 | HR 100 | Temp 98.9°F | Ht 70.0 in | Wt 213.1 lb

## 2011-01-10 DIAGNOSIS — K5909 Other constipation: Secondary | ICD-10-CM | POA: Insufficient documentation

## 2011-01-10 DIAGNOSIS — K5900 Constipation, unspecified: Secondary | ICD-10-CM

## 2011-01-10 DIAGNOSIS — R61 Generalized hyperhidrosis: Secondary | ICD-10-CM | POA: Insufficient documentation

## 2011-01-10 DIAGNOSIS — E119 Type 2 diabetes mellitus without complications: Secondary | ICD-10-CM

## 2011-01-10 DIAGNOSIS — R739 Hyperglycemia, unspecified: Secondary | ICD-10-CM

## 2011-01-10 DIAGNOSIS — R634 Abnormal weight loss: Secondary | ICD-10-CM

## 2011-01-10 LAB — CBC WITH DIFFERENTIAL/PLATELET
Basophils Relative: 0.4 % (ref 0.0–3.0)
Eosinophils Relative: 0.3 % (ref 0.0–5.0)
Lymphocytes Relative: 15.1 % (ref 12.0–46.0)
Neutrophils Relative %: 77.8 % — ABNORMAL HIGH (ref 43.0–77.0)
RBC: 4.97 Mil/uL (ref 4.22–5.81)
WBC: 8.4 10*3/uL (ref 4.5–10.5)

## 2011-01-10 LAB — COMPREHENSIVE METABOLIC PANEL
ALT: 23 U/L (ref 0–53)
CO2: 23 mEq/L (ref 19–32)
Chloride: 101 mEq/L (ref 96–112)
GFR: 60.49 mL/min (ref >60.00)
Sodium: 132 mEq/L — ABNORMAL LOW (ref 135–145)
Total Bilirubin: 0.5 mg/dL (ref 0.3–1.2)
Total Protein: 7 g/dL (ref 6.0–8.3)

## 2011-01-10 MED ORDER — POLYETHYLENE GLYCOL 3350 17 G PO PACK: 17.0000 g | PACK | Freq: Every day | ORAL | Status: DC

## 2011-01-10 MED ORDER — METFORMIN HCL 500 MG PO TABS: ORAL_TABLET | ORAL | Status: DC

## 2011-01-10 NOTE — Patient Instructions (Signed)
For constipation - push fluids (plenty of water).  Start miralax 1 cupful mixed in 8 oz fluid daily until regular stools, then may back off.  Hold for loose stools. For diabetes - increase metformin to one in am and two in pm.  Push water. If not improving, return to see [place] sooner than June appt with Dr. Alphonsus Sias.

## 2011-01-10 NOTE — Telephone Encounter (Signed)
Pt states his mouth and lips stay dry, teeth feel tarry.  He is asking if something that he is taking is causing this reaction, is he taking too much B12?  Please call him if you can.

## 2011-01-10 NOTE — Assessment & Plan Note (Signed)
Discussed more fiber, water.  Prescribed miralax.  No obstruction as passing gas.  If not improved rpt iFOB although pt denies blood in stool.  Check Hgb today.  Last iFOB negative 10/2009.

## 2011-01-10 NOTE — Assessment & Plan Note (Addendum)
Increase metformin to 1 pill in am, 2 in pm of 500mg .  Check Cr.   Continue glipizide. Lab Results  Component Value Date   HGBA1C 7.9* 07/19/2010

## 2011-01-10 NOTE — Assessment & Plan Note (Addendum)
Wt Readings from Last 3 Encounters:  01/10/11 213 lb 1.9 oz (96.671 kg)  10/20/10 229 lb (103.874 kg)  08/18/10 236 lb (107.049 kg)  noted weight loss, in setting of recent hospitalization for ?depression/anxiety.  Also with sweats, check basic blood work today.   Pt has f/u scheduled with PCP in June.  Advised if not improving, to return sooner.  No h/o colonoscopy.  iFOB negative 10/2009. Last PSA normal 08/2009.

## 2011-01-10 NOTE — Telephone Encounter (Signed)
Only psych med is lexapro Has been transferred to counsellor in [facility] dept down sizing Just started the lexapro last week so this isn't the problem No longer on the imipramine  He will stop the vitamin B12 just in case Requests appt--will see next Tuesday at [facility]

## 2011-01-10 NOTE — Progress Notes (Signed)
  Subjective:    Patient ID: Randy Mcdaniel, male    DOB: 05/29/1941, 70 y.o.   MRN: [id number]  HPI CC: hyperglycemia  70 yo new to me with h/o HTN, HLD, T2DM (dx 8 yrs ago), and hypothyroidism presents with elevated sugar.  Friday afternoon 230, this morning fasting 170.  Usually very well controlled (fasting 90-120).    Was recently at [facility] around [place] for 1 wk for "depression" but told not depressed.  Referred to outpatient group sessions 3x/wk.  Also to see Dr. Prudencio Pair at [facility], psychiatrist.  Lexapro started 4 days ago.  Thinks sugar has been elevated since admission to [facility].  Also notes recent weight loss, hasn't been trying. Wt Readings from Last 3 Encounters:  01/10/11 213 lb 1.9 oz (96.671 kg)  10/20/10 229 lb (103.874 kg)  08/18/10 236 lb (107.049 kg)  No appetite.  Not eating well 2/2 decreased taste.  Weight down, attributes to sugar.  No recent blood work.  Denies fevers or chills.  + night sweats for last several weeks as well as diaphoresis during day.  No chest pain/tightness, SOB, leg swelling, abd pain, n/v/d.  No new rashes.  + constipation, last BM last week.  passing gas fine. No blood in stool.  No recent blood work.  Review of Systems Per HPI    Objective:   Physical Exam  Nursing note and vitals reviewed. Constitutional: He appears well-developed and well-nourished. No distress.  HENT:  Head: Normocephalic and atraumatic.  Mouth/Throat: Oropharynx is clear and moist. No oropharyngeal exudate.  Eyes: Conjunctivae and EOM are normal. Pupils are equal, round, and reactive to light.  Neck: Normal range of motion. Neck supple.  Cardiovascular: Normal rate, regular rhythm, normal heart sounds and intact distal pulses.   No murmur heard. Pulmonary/Chest: Effort normal and breath sounds normal. No respiratory distress. He has no wheezes. He has no rales.  Lymphadenopathy:    He has no cervical adenopathy.    He has no axillary  adenopathy.       Right: No supraclavicular adenopathy present.       Left: No supraclavicular adenopathy present.  Skin: Skin is warm and dry. No rash noted.  Psychiatric: He has a normal mood and affect.          Assessment & Plan:

## 2011-01-17 ENCOUNTER — Ambulatory Visit: Payer: BC Managed Care – PPO | Admitting: Family Medicine

## 2011-01-18 ENCOUNTER — Ambulatory Visit (INDEPENDENT_AMBULATORY_CARE_PROVIDER_SITE_OTHER): Payer: Medicare Other | Admitting: Internal Medicine

## 2011-01-18 ENCOUNTER — Encounter: Payer: Self-pay | Admitting: Internal Medicine

## 2011-01-18 VITALS — BP 120/70 | HR 103 | Temp 98.6°F | Ht 72.0 in | Wt 210.0 lb

## 2011-01-18 DIAGNOSIS — E1129 Type 2 diabetes mellitus with other diabetic kidney complication: Secondary | ICD-10-CM

## 2011-01-18 DIAGNOSIS — F329 Major depressive disorder, single episode, unspecified: Secondary | ICD-10-CM

## 2011-01-18 DIAGNOSIS — F411 Generalized anxiety disorder: Secondary | ICD-10-CM

## 2011-01-18 DIAGNOSIS — E538 Deficiency of other specified B group vitamins: Secondary | ICD-10-CM

## 2011-01-18 MED ORDER — LOVASTATIN 40 MG PO TABS: 40.0000 mg | ORAL_TABLET | Freq: Every day | ORAL | Status: DC

## 2011-01-18 MED ORDER — LEVOTHYROXINE SODIUM 75 MCG PO TABS: 75.0000 ug | ORAL_TABLET | Freq: Every day | ORAL | Status: DC

## 2011-01-18 MED ORDER — LISINOPRIL 10 MG PO TABS: 10.0000 mg | ORAL_TABLET | Freq: Every day | ORAL | Status: DC

## 2011-01-18 NOTE — Progress Notes (Signed)
Subjective:    Patient ID: Randy Mcdaniel, male    DOB: May 29, 1941, 70 y.o.   MRN: [id number]  HPI Wife is here "[facility] has been a disaster" No consistency, admitted and that didn't help. He really had gotten hopeless  Has had dry mouth and throat Numbness in hands and feet Not eating---has lost all taste Tingling all over body Groin pain "like something has been pulled" Feet are cold, then hot Burning sensation in lower abdomen (on surface) DOing intensive outpatient group experience with Dr Prudencio Pair Now on lexapro 20mg  daily Has restarted klonopin at night only (though then they were changing to valium)  Very "nervous and tingly" Will have sweats in the morning---even just after showering  Now with painful burning area on left lower abdomen Worried about shingles  Has considered suicide Planned taking "all my pills" Feels his wife keeps him from doing anything  Sugars have been below 200  Usually 160-170 No hypoglycemic spells  Current outpatient prescriptions:escitalopram (LEXAPRO) 10 MG tablet, Take 10 mg by mouth daily.  , Disp: , Rfl: ;  glipiZIDE (GLUCOTROL) 5 MG tablet, Take 5 mg by mouth 2 (two) times daily before a meal.  , Disp: , Rfl: ;  levothyroxine (SYNTHROID, LEVOTHROID) 75 MCG tablet, Take 1 tablet (75 mcg total) by mouth daily., Disp: 90 tablet, Rfl: 3 lisinopril (PRINIVIL,ZESTRIL) 10 MG tablet, Take 1 tablet (10 mg total) by mouth daily., Disp: 90 tablet, Rfl: 3;  lovastatin (MEVACOR) 40 MG tablet, Take 1 tablet (40 mg total) by mouth at bedtime., Disp: 90 tablet, Rfl: 3;  metFORMIN (GLUCOPHAGE) 500 MG tablet, Take 1 tablet in am and 2 tablets in pm for sugar, Disp: 90 tablet, Rfl: 6;  polyethylene glycol (MIRALAX) packet, Take 17 g by mouth daily., Disp: 30 each, Rfl: 0 DISCONTD: levothyroxine (SYNTHROID, LEVOTHROID) 75 MCG tablet, Take 75 mcg by mouth daily.  , Disp: , Rfl: ;  DISCONTD: lisinopril (PRINIVIL,ZESTRIL) 10 MG tablet, Take 10 mg by mouth  daily.  , Disp: , Rfl: ;  DISCONTD: lovastatin (MEVACOR) 40 MG tablet, Take 40 mg by mouth at bedtime.  , Disp: , Rfl: ;  DISCONTD: Cyanocobalamin (VITAMIN B-12) 1000 MCG SUBL, Place 1 tablet under the tongue daily.  , Disp: , Rfl:  DISCONTD: diazepam (VALIUM) 10 MG tablet, Take 20 mg by mouth 3 (three) times daily.  , Disp: , Rfl: ;  DISCONTD: imipramine (TOFRANIL) 10 MG tablet, Take 10 mg by mouth as directed. 1 in the morning and 2 at bedtime , Disp: , Rfl:   Past Medical History  Diagnosis Date  . Anxiety   . Depression     recurrent and severe  . Diabetes mellitus     Type II with nephropathy  . HLD (hyperlipidemia)   . Hypothyroidism   . Osteoarthritis     Past Surgical History  Procedure Date  . Umbilical hernia repair 2000  . 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 Remer Macho ([facility])  . Total knee arthroplasty 6/10    Left-Dr. Remer Macho ([facility])    Family History  Problem Relation Age of Onset  . Heart failure Father   . Diabetes Father   . Depression Father   . Breast cancer Sister   . Lung cancer Sister     ?   . Depression Sister   . Coronary artery disease Father   . Stroke      Paternal side  . Parkinsonism      Cousin  History   Social History  . Marital Status: Married    Spouse Name: N/A    Number of Children: 2  . Years of Education: N/A   Occupational History  . Retired    Social History Main Topics  . Smoking status: Never Smoker   . Smokeless tobacco: Current User    Types: Chew  . Alcohol Use: No  . Drug Use: Not on file  . Sexually Active: Not on file   Other Topics Concern  . Not on file   Social History Narrative  . No narrative on file   Review of Systems Weight is down 20# over 6 months Not sleeping well--got 25mg  of seroquel and it "zonked" him out and didn't help Back to klonopin bid     Objective:   Physical Exam  HENT:  Mouth/Throat: Oropharynx is clear and moist. No oropharyngeal exudate.       Mucus membranes moist    Skin:       No rash on left lower abdomen  Psychiatric: His behavior is normal. Judgment and thought content normal.       Depressed Constricted affect          Assessment & Plan:

## 2011-02-14 ENCOUNTER — Telehealth: Payer: Self-pay | Admitting: *Deleted

## 2011-02-14 NOTE — Telephone Encounter (Signed)
Left message on machine with results for nurse, advised her to call if any questions.

## 2011-02-14 NOTE — Telephone Encounter (Signed)
Yes, I am fine with taking over the prescribing again

## 2011-02-14 NOTE — Telephone Encounter (Signed)
Pt is being discharged from intensive therapy program in [place] and they - pt and nurse- are calling to find out if you will continue to prescribe lexapro, zyprexa and klonopin.  Please let pt know as well,he is at home number.

## 2011-02-23 ENCOUNTER — Telehealth: Payer: Self-pay | Admitting: *Deleted

## 2011-02-23 ENCOUNTER — Ambulatory Visit: Payer: BC Managed Care – PPO | Admitting: Internal Medicine

## 2011-02-23 NOTE — Telephone Encounter (Signed)
If he can tolerate increasing the metformin up to 1000mg  bid, he should do that Otherwise, we may need to start another med if it stays up like that Overall control has been okay up till now but due for another HGbA1c---why don't you have him come in for one soon (next week or something) to see if his overall control has gotten worse

## 2011-02-23 NOTE — Telephone Encounter (Signed)
Patient says that his sugar has been running in the 200s for the past week. This morning he at breakfast at 7:30 and at 10:00 he checked his sugar and it was 307. He says that he feels fine. He is asking what he should do.

## 2011-02-24 NOTE — Telephone Encounter (Signed)
Tried calling home number, it just rang, called cell phone and his voicemail box haven't been set up yet. Will try again tomorrow.

## 2011-03-01 ENCOUNTER — Encounter: Payer: Self-pay | Admitting: Internal Medicine

## 2011-03-01 ENCOUNTER — Ambulatory Visit (INDEPENDENT_AMBULATORY_CARE_PROVIDER_SITE_OTHER): Payer: Medicare Other | Admitting: Internal Medicine

## 2011-03-01 VITALS — BP 122/68 | HR 98 | Temp 98.4°F | Ht 72.0 in | Wt 222.0 lb

## 2011-03-01 DIAGNOSIS — E1129 Type 2 diabetes mellitus with other diabetic kidney complication: Secondary | ICD-10-CM

## 2011-03-01 DIAGNOSIS — F329 Major depressive disorder, single episode, unspecified: Secondary | ICD-10-CM

## 2011-03-01 DIAGNOSIS — F411 Generalized anxiety disorder: Secondary | ICD-10-CM

## 2011-03-01 LAB — BASIC METABOLIC PANEL
BUN: 15 mg/dL (ref 6–23)
Creatinine, Ser: 1.1 mg/dL (ref 0.4–1.5)
GFR: 69.35 mL/min (ref >60.00)
Glucose, Bld: 253 mg/dL — ABNORMAL HIGH (ref 70–99)
Potassium: 4.8 mEq/L (ref 3.5–5.1)

## 2011-03-01 MED ORDER — METFORMIN HCL 1000 MG PO TABS: 1000.0000 mg | ORAL_TABLET | Freq: Two times a day (BID) | ORAL | Status: DC

## 2011-03-01 NOTE — Assessment & Plan Note (Signed)
Sugar and weight higher now on the zyprexa Discussed working on proper eating and activity Will add januvia if over 9%

## 2011-03-01 NOTE — Progress Notes (Signed)
Subjective:    Patient ID: Randy Mcdaniel, male    DOB: 07/28/1941, 70 y.o.   MRN: [id number]  HPI Done with [place]--they have asked me to take over his care Now back on zyprexa Eating "everything in the house" Mood is much better!!  Sugars have been running up As high as 307--but this was not fasting Under 200 fasting  Trying to walk some Working on some hobbies---doing word games, etc  No sig problems with anxiety No panic  Current Outpatient Prescriptions on File Prior to Visit  Medication Sig Dispense Refill  . glipiZIDE (GLUCOTROL) 5 MG tablet Take 5 mg by mouth 2 (two) times daily before a meal.        . levothyroxine (SYNTHROID, LEVOTHROID) 75 MCG tablet Take 1 tablet (75 mcg total) by mouth daily.  90 tablet  3  . lisinopril (PRINIVIL,ZESTRIL) 10 MG tablet Take 1 tablet (10 mg total) by mouth daily.  90 tablet  3  . lovastatin (MEVACOR) 40 MG tablet Take 1 tablet (40 mg total) by mouth at bedtime.  90 tablet  3  . metFORMIN (GLUCOPHAGE) 500 MG tablet Take 1 tablet in am and 2 tablets in pm for sugar  90 tablet  6  . DISCONTD: escitalopram (LEXAPRO) 10 MG tablet Take 10 mg by mouth daily.        Marland Kitchen DISCONTD: polyethylene glycol (MIRALAX) packet Take 17 g by mouth daily.  30 each  0    Allergies  Allergen Reactions  . Ezetimibe-Simvastatin     REACTION: nausea  . QQV:ZDGLOVFIEPP+IRJJOACZY+SAYTKZSWFU Acid+Aspartame     REACTION: gi problems--tolerates amoxicillin    Past Medical History  Diagnosis Date  . Anxiety   . Depression     recurrent and severe  . Diabetes mellitus     Type II with nephropathy  . HLD (hyperlipidemia)   . Hypothyroidism   . Osteoarthritis     Past Surgical History  Procedure Date  . Umbilical hernia repair 2000  . 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 Remer Macho ([facility])  . Total knee arthroplasty 6/10    Left-Dr. Remer Macho ([facility])    Family History  Problem Relation Age of Onset  . Heart failure Father   . Diabetes Father    . Depression Father   . Breast cancer Sister   . Lung cancer Sister     ?   . Depression Sister   . Coronary artery disease Father   . Stroke      Paternal side  . Parkinsonism      Cousin    History   Social History  . Marital Status: Married    Spouse Name: N/A    Number of Children: 2  . Years of Education: N/A   Occupational History  . Retired    Social History Main Topics  . Smoking status: Never Smoker   . Smokeless tobacco: Current User    Types: Chew  . Alcohol Use: No  . Drug Use: Not on file  . Sexually Active: Not on file   Other Topics Concern  . Not on file   Social History Narrative  . No narrative on file   Review of Systems Weight is up ~6# since starting the zypexa Sleep is still not that great    Objective:   Physical Exam  Constitutional: He appears well-developed and well-nourished. No distress.  Neck: Normal range of motion. Neck supple. No thyromegaly present.  Cardiovascular: Normal rate, regular rhythm, normal  heart sounds and intact distal pulses.  Exam reveals no gallop.   No murmur heard. Pulmonary/Chest: Effort normal and breath sounds normal. No respiratory distress. He has no wheezes. He has no rales.  Musculoskeletal: Normal range of motion. He exhibits no edema and no tenderness.  Lymphadenopathy:    He has no cervical adenopathy.  Psychiatric: He has a normal mood and affect. His behavior is normal. Judgment and thought content normal.       What a change!! Upbeat and good spirits          Assessment & Plan:

## 2011-03-01 NOTE — Telephone Encounter (Signed)
Patient came in today for office visit, discussed with Dr.Letvak

## 2011-03-01 NOTE — Patient Instructions (Signed)
Please decrease the lexapro to 20mg  daily Change to the higher metformin dose--1000mg  twice a day

## 2011-03-01 NOTE — Assessment & Plan Note (Signed)
Also better on the zyprexa No other changes in regimen for now

## 2011-03-01 NOTE — Assessment & Plan Note (Signed)
Markedly better back on zyprexa Will continue Decrease the lexapro to 20mg  daily

## 2011-03-07 ENCOUNTER — Ambulatory Visit: Payer: Medicare Other | Admitting: Internal Medicine

## 2011-03-10 ENCOUNTER — Telehealth: Payer: Self-pay | Admitting: *Deleted

## 2011-03-10 NOTE — Telephone Encounter (Signed)
Patient called back and I advised results 

## 2011-03-10 NOTE — Telephone Encounter (Signed)
Message copied by Sueanne Margarita on Thu Mar 10, 2011  8:34 AM ------      Message from: Tillman Abide I      Created: Wed Mar 02, 2011  8:47 AM       Please call      The diabetes control has worsened as expected with A1c up to 8.9%. I would like to hold off on new med---increase the metformin as we discussed and try to eat no calorie foods when appetite up due to the zyprexa. We will recheck this next time      Kidney tests remain normal

## 2011-03-10 NOTE — Telephone Encounter (Signed)
.  left message to have patient return my call.  

## 2011-03-21 ENCOUNTER — Telehealth: Payer: Self-pay | Admitting: *Deleted

## 2011-03-21 NOTE — Telephone Encounter (Signed)
Pt states his blood sugars are still running around 200 and he is asking if he can add a 3rd metformin to his regimen.  He takes one in the morning and one in the evening and wants to take one midday.  Please advise.

## 2011-03-21 NOTE — Telephone Encounter (Signed)
Spoke with patient and advised results   

## 2011-03-21 NOTE — Telephone Encounter (Signed)
That would be too much but it is okay if he takes 1/2 with the midday meal

## 2011-03-25 ENCOUNTER — Other Ambulatory Visit: Payer: Self-pay | Admitting: *Deleted

## 2011-03-25 MED ORDER — GLUCOSE BLOOD VI STRP: ORAL_STRIP | Status: DC

## 2011-03-25 NOTE — Telephone Encounter (Signed)
rx sent to pharmacy by e-script  

## 2011-05-20 ENCOUNTER — Other Ambulatory Visit: Payer: Self-pay | Admitting: *Deleted

## 2011-05-20 MED ORDER — METFORMIN HCL 1000 MG PO TABS: 1000.0000 mg | ORAL_TABLET | Freq: Two times a day (BID) | ORAL | Status: DC

## 2011-05-20 NOTE — Telephone Encounter (Signed)
Spoke with patient and he was very nervous about running out of metformin, so I sent in refill for 180 until we get clarification on dosage.

## 2011-05-20 NOTE — Telephone Encounter (Signed)
Pt states he is to take one in the morning, 1/2 at lunch and one at night.  He needs a quantity of 225. He is almost out and needs refilled today.  States pharmacy has already sent 2 faxes.  He has follow up with Dr. Alphonsus Sias on Wednesday.

## 2011-05-20 NOTE — Telephone Encounter (Signed)
Fax from pharmacy stating that pt's metformin 1000mg  tab was increased to 2 1/2 tablets daily? Per phone note on 03/21/11, pt was to add 1/2 tablet to his midday meal, was that 1/2 of the morning or evening dose or 1 in AM, 1/2 lunch & 1 in PM? Please advise.  Form on your desk

## 2011-05-21 NOTE — Telephone Encounter (Signed)
That dose is fine Okay to refill #225 for a year

## 2011-05-23 MED ORDER — METFORMIN HCL 1000 MG PO TABS: ORAL_TABLET | ORAL | Status: DC

## 2011-05-23 NOTE — Telephone Encounter (Signed)
Medication phoned to [place] at [facility] pharmacy as instructed.

## 2011-05-25 ENCOUNTER — Encounter: Payer: Self-pay | Admitting: Internal Medicine

## 2011-05-25 ENCOUNTER — Ambulatory Visit (INDEPENDENT_AMBULATORY_CARE_PROVIDER_SITE_OTHER): Payer: Medicare Other | Admitting: Internal Medicine

## 2011-05-25 VITALS — BP 121/78 | HR 80 | Temp 98.5°F | Ht 72.0 in | Wt 241.0 lb

## 2011-05-25 DIAGNOSIS — M1990 Unspecified osteoarthritis, unspecified site: Secondary | ICD-10-CM

## 2011-05-25 DIAGNOSIS — F3289 Other specified depressive episodes: Secondary | ICD-10-CM

## 2011-05-25 DIAGNOSIS — E1129 Type 2 diabetes mellitus with other diabetic kidney complication: Secondary | ICD-10-CM

## 2011-05-25 DIAGNOSIS — F329 Major depressive disorder, single episode, unspecified: Secondary | ICD-10-CM

## 2011-05-25 MED ORDER — METHYLPHENIDATE HCL 5 MG PO TABS: 5.0000 mg | ORAL_TABLET | Freq: Two times a day (BID) | ORAL | Status: DC

## 2011-05-25 NOTE — Assessment & Plan Note (Signed)
Some back and leg pain but doesn't seem to be a major factor now

## 2011-05-25 NOTE — Assessment & Plan Note (Signed)
Not doing that well Not overtly exacerbated but not doing great Clear anhedonia No suicidal thoughts  Discussed daily walking Add ritalin again for augmentation

## 2011-05-25 NOTE — Assessment & Plan Note (Signed)
Clearly not good control Will increase exercise---if depression better, hopefully won't eat as much [place] eventually if can't get it under reasonable control

## 2011-05-25 NOTE — Progress Notes (Signed)
Subjective:    Patient ID: Randy Mcdaniel, male    DOB: May 09, 1941, 70 y.o.   MRN: [id number]  HPI He states things are okay Wife is concerned about his mood---"he is slipping" Doesn't want to go anywhere, has stopped his puzzled All he wants to do is eat No exercise Not thinking about dying  Sugars up and down No hypoglycemia  No chest pain Some DOE----has been very sedentary though  [place] and hurt back about 6 months ago Pain radiates down both posterior thighs Seems to be clearing up some  Current Outpatient Prescriptions on File Prior to Visit  Medication Sig Dispense Refill  . clonazePAM (KLONOPIN) 0.5 MG tablet Take 0.5 mg by mouth 3 (three) times daily as needed.       Marland Kitchen escitalopram (LEXAPRO) 20 MG tablet Take 20 mg by mouth daily.       Marland Kitchen glipiZIDE (GLUCOTROL) 5 MG tablet Take 5 mg by mouth 2 (two) times daily before a meal.        . glucose blood test strip Dx: 250.40  100 each  6  . levothyroxine (SYNTHROID, LEVOTHROID) 75 MCG tablet Take 1 tablet (75 mcg total) by mouth daily.  90 tablet  3  . lisinopril (PRINIVIL,ZESTRIL) 10 MG tablet Take 1 tablet (10 mg total) by mouth daily.  90 tablet  3  . lovastatin (MEVACOR) 40 MG tablet Take 1 tablet (40 mg total) by mouth at bedtime.  90 tablet  3  . metFORMIN (GLUCOPHAGE) 1000 MG tablet Take one tablet by mouth in AM, 1/2 tablet by mouth at lunch and one tablet by mouth at bedtime.  225 tablet  11  . OLANZapine (ZYPREXA) 10 MG tablet Take 10 mg by mouth at bedtime.        . vitamin B-12 (CYANOCOBALAMIN) 1000 MCG tablet Take 1,000 mcg by mouth daily.          Allergies  Allergen Reactions  . Ezetimibe-Simvastatin     REACTION: nausea  . WGN:[id number]+IONGEXBMW+[id number] Acid+Aspartame     REACTION: gi problems--tolerates amoxicillin    Past Medical History  Diagnosis Date  . Anxiety   . Depression     recurrent and severe  . Diabetes mellitus     Type II with nephropathy  . HLD (hyperlipidemia)   .  Hypothyroidism   . Osteoarthritis     Past Surgical History  Procedure Date  . Umbilical hernia repair 2000  . 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 Remer Macho ([facility])  . Total knee arthroplasty 6/10    Left-Dr. Remer Macho ([facility])    Family History  Problem Relation Age of Onset  . Heart failure Father   . Diabetes Father   . Depression Father   . Breast cancer Sister   . Lung cancer Sister     ?   . Depression Sister   . Coronary artery disease Father   . Stroke      Paternal side  . Parkinsonism      Cousin    History   Social History  . Marital Status: Married    Spouse Name: N/A    Number of Children: 2  . Years of Education: N/A   Occupational History  . Retired    Social History Main Topics  . Smoking status: Never Smoker   . Smokeless tobacco: Current User    Types: Chew  . Alcohol Use: No  . Drug Use: Not on file  . Sexually Active: Not on  file   Other Topics Concern  . Not on file   Social History Narrative  . No narrative on file     Review of Systems Weight is up ~20# Sleeps "like a log"----about 12 hours at night but no sig napping    Objective:   Physical Exam  Constitutional: He appears well-developed and well-nourished. No distress.  Neck: Normal range of motion. Neck supple. No thyromegaly present.  Cardiovascular: Normal rate, regular rhythm and normal heart sounds.  Exam reveals no gallop.   No murmur heard. Pulmonary/Chest: Effort normal and breath sounds normal. No respiratory distress. He has no wheezes. He has no rales.  Musculoskeletal: Normal range of motion. He exhibits no edema and no tenderness.  Lymphadenopathy:    He has no cervical adenopathy.  Psychiatric: His behavior is normal. Thought content normal.       Dysthymic mood with flattened affect          Assessment & Plan:

## 2011-06-24 ENCOUNTER — Ambulatory Visit (INDEPENDENT_AMBULATORY_CARE_PROVIDER_SITE_OTHER): Payer: Medicare Other | Admitting: Internal Medicine

## 2011-06-24 ENCOUNTER — Encounter: Payer: Self-pay | Admitting: Internal Medicine

## 2011-06-24 ENCOUNTER — Other Ambulatory Visit: Payer: Self-pay | Admitting: *Deleted

## 2011-06-24 VITALS — BP 126/82 | HR 88 | Temp 98.5°F | Wt 244.0 lb

## 2011-06-24 DIAGNOSIS — F329 Major depressive disorder, single episode, unspecified: Secondary | ICD-10-CM

## 2011-06-24 MED ORDER — LEVOTHYROXINE SODIUM 75 MCG PO TABS: 75.0000 ug | ORAL_TABLET | Freq: Every day | ORAL | Status: DC

## 2011-06-24 MED ORDER — METHYLPHENIDATE HCL 5 MG PO TABS: 5.0000 mg | ORAL_TABLET | Freq: Two times a day (BID) | ORAL | Status: DC

## 2011-06-24 NOTE — Assessment & Plan Note (Signed)
Better back on the ritalin Concerned about appetite and weight but too soon to try weaning zyprexa No changed for now counselled about fitness

## 2011-06-24 NOTE — Progress Notes (Signed)
Subjective:    Patient ID: Randy Mcdaniel, male    DOB: Jan 08, 1941, 70 y.o.   MRN: [id number]  HPI Here with wife  He doesn't notice that much difference but wife notes a sig change Demeanor is better Still eating a lot Wife concerned about his inactivity----barely lifts feet when he walks Uses cane but has already fallen  Weight up 3# more  Has sore on right posterior hip from fall over a month ago No sig improvement Has radiated into right leg  Current Outpatient Prescriptions on File Prior to Visit  Medication Sig Dispense Refill  . clonazePAM (KLONOPIN) 0.5 MG tablet Take 0.5 mg by mouth 3 (three) times daily as needed.       Marland Kitchen escitalopram (LEXAPRO) 20 MG tablet Take 20 mg by mouth daily.       Marland Kitchen glipiZIDE (GLUCOTROL) 5 MG tablet Take 5 mg by mouth 2 (two) times daily before a meal.        . glucose blood test strip Dx: 250.40  100 each  6  . lisinopril (PRINIVIL,ZESTRIL) 10 MG tablet Take 1 tablet (10 mg total) by mouth daily.  90 tablet  3  . lovastatin (MEVACOR) 40 MG tablet Take 1 tablet (40 mg total) by mouth at bedtime.  90 tablet  3  . metFORMIN (GLUCOPHAGE) 1000 MG tablet Take one tablet by mouth in AM, 1/2 tablet by mouth at lunch and one tablet by mouth at bedtime.  225 tablet  11  . methylphenidate (RITALIN) 5 MG tablet Take 1 tablet (5 mg total) by mouth 2 (two) times daily.  60 tablet  0  . OLANZapine (ZYPREXA) 10 MG tablet Take 10 mg by mouth at bedtime.        . vitamin B-12 (CYANOCOBALAMIN) 1000 MCG tablet Take 1,000 mcg by mouth daily.          Allergies  Allergen Reactions  . Ezetimibe-Simvastatin     REACTION: nausea  . ZHY:QMVHQIONGEX+BMWUXLKGM+WNUUVOZDGU Acid+Aspartame     REACTION: gi problems--tolerates amoxicillin    Past Medical History  Diagnosis Date  . Anxiety   . Depression     recurrent and severe  . Diabetes mellitus     Type II with nephropathy  . HLD (hyperlipidemia)   . Hypothyroidism   . Osteoarthritis     Past Surgical  History  Procedure Date  . Umbilical hernia repair 2000  . 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 Remer Macho ([facility])  . Total knee arthroplasty 6/10    Left-Dr. Remer Macho ([facility])    Family History  Problem Relation Age of Onset  . Heart failure Father   . Diabetes Father   . Depression Father   . Breast cancer Sister   . Lung cancer Sister     ?   . Depression Sister   . Coronary artery disease Father   . Stroke      Paternal side  . Parkinsonism      Cousin    History   Social History  . Marital Status: Married    Spouse Name: N/A    Number of Children: 2  . Years of Education: N/A   Occupational History  . Retired    Social History Main Topics  . Smoking status: Never Smoker   . Smokeless tobacco: Former Neurosurgeon    Types: Chew  . Alcohol Use: No  . Drug Use: Not on file  . Sexually Active: Not on file   Other Topics Concern  .  Not on file   Social History Narrative  . No narrative on file   Review of Systems Sleeping okay No sugared drinks---tends to eat junk food--cookies      Objective:   Physical Exam  Musculoskeletal:       No right hip tenderness Limited rotation of right hip but no pain  Psychiatric: His behavior is normal. Judgment and thought content normal.       Still somewhat flat but neutral mood          Assessment & Plan:

## 2011-07-06 ENCOUNTER — Ambulatory Visit (INDEPENDENT_AMBULATORY_CARE_PROVIDER_SITE_OTHER): Payer: Medicare Other | Admitting: Internal Medicine

## 2011-07-06 ENCOUNTER — Encounter: Payer: Self-pay | Admitting: Internal Medicine

## 2011-07-06 VITALS — BP 135/79 | HR 84 | Temp 98.4°F | Ht 72.0 in | Wt 244.0 lb

## 2011-07-06 DIAGNOSIS — J209 Acute bronchitis, unspecified: Secondary | ICD-10-CM

## 2011-07-06 DIAGNOSIS — R059 Cough, unspecified: Secondary | ICD-10-CM

## 2011-07-06 DIAGNOSIS — R05 Cough: Secondary | ICD-10-CM

## 2011-07-06 MED ORDER — HYDROCODONE-HOMATROPINE 5-1.5 MG PO TABS: 1.0000 | ORAL_TABLET | Freq: Every evening | ORAL | Status: DC | PRN

## 2011-07-06 MED ORDER — PREDNISONE 20 MG PO TABS: 40.0000 mg | ORAL_TABLET | Freq: Every day | ORAL | Status: AC

## 2011-07-06 MED ORDER — AMOXICILLIN 500 MG PO TABS: 1000.0000 mg | ORAL_TABLET | Freq: Two times a day (BID) | ORAL | Status: AC

## 2011-07-06 NOTE — Patient Instructions (Signed)
Please try the tablets for cough. If you are no better in 2-3 days, fill the other 2 prescriptions and start them

## 2011-07-06 NOTE — Assessment & Plan Note (Addendum)
Clearly seems to have an infectious process Likely viral---at least at first Not clear if he has secondary bacterial infection  Spirometry shows marked restriction Unclear if any obstructive process  Will try cough syrup Empiric prednisone if no better in 2-3 days

## 2011-07-06 NOTE — Progress Notes (Signed)
Subjective:    Patient ID: Randy Mcdaniel, male    DOB: [birth year], 70 y.o.   MRN: [id number]  HPI Has had cold about 1 week Wife and MIL wanted him to come in Lots of cough--mostly dry No fever Has some SOB even just sitting around  Has "packed" feeling around eyes and nose Some PND at night No sig rhinorrhea Some sore thraot No trouble swallowing  No ear pain  Current Outpatient Prescriptions on File Prior to Visit  Medication Sig Dispense Refill  . clonazePAM (KLONOPIN) 0.5 MG tablet Take 0.5 mg by mouth 3 (three) times daily as needed.       Marland Kitchen escitalopram (LEXAPRO) 20 MG tablet Take 20 mg by mouth daily.       Marland Kitchen glipiZIDE (GLUCOTROL) 5 MG tablet Take 5 mg by mouth 2 (two) times daily before a meal.        . glucose blood test strip Dx: 250.40  100 each  6  . levothyroxine (SYNTHROID, LEVOTHROID) 75 MCG tablet Take 1 tablet (75 mcg total) by mouth daily.  90 tablet  3  . lisinopril (PRINIVIL,ZESTRIL) 10 MG tablet Take 1 tablet (10 mg total) by mouth daily.  90 tablet  3  . lovastatin (MEVACOR) 40 MG tablet Take 1 tablet (40 mg total) by mouth at bedtime.  90 tablet  3  . metFORMIN (GLUCOPHAGE) 1000 MG tablet Take one tablet by mouth in AM, 1/2 tablet by mouth at lunch and one tablet by mouth at bedtime.  225 tablet  11  . methylphenidate (RITALIN) 5 MG tablet Take 1 tablet (5 mg total) by mouth 2 (two) times daily.  60 tablet  0  . OLANZapine (ZYPREXA) 10 MG tablet Take 10 mg by mouth at bedtime.        . vitamin B-12 (CYANOCOBALAMIN) 1000 MCG tablet Take 1,000 mcg by mouth daily.          Allergies  Allergen Reactions  . Ezetimibe-Simvastatin     REACTION: nausea  . WUJ:WJXBJYNWGNF+AOZHYQMVH+QIONGEXBMW Acid+Aspartame     REACTION: gi problems--tolerates amoxicillin    Past Medical History  Diagnosis Date  . Anxiety   . Depression     recurrent and severe  . Diabetes mellitus     Type II with nephropathy  . HLD (hyperlipidemia)   . Hypothyroidism   .  Osteoarthritis     Past Surgical History  Procedure Date  . Umbilical hernia repair 2000  . 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 Remer Macho ([facility])  . Total knee arthroplasty 6/10    Left-Dr. Remer Macho ([facility])    Family History  Problem Relation Age of Onset  . Heart failure Father   . Diabetes Father   . Depression Father   . Breast cancer Sister   . Lung cancer Sister     ?   . Depression Sister   . Coronary artery disease Father   . Stroke      Paternal side  . Parkinsonism      Cousin    History   Social History  . Marital Status: Married    Spouse Name: N/A    Number of Children: 2  . Years of Education: N/A   Occupational History  . Retired    Social History Main Topics  . Smoking status: Never Smoker   . Smokeless tobacco: Former Neurosurgeon    Types: Chew  . Alcohol Use: No  . Drug Use: Not on file  . Sexually  Active: Not on file   Other Topics Concern  . Not on file   Social History Narrative  . No narrative on file   Review of Systems No N/V No diarrhea  Appetite has been maintained     Objective:   Physical Exam  Constitutional: He appears well-developed and well-nourished. No distress.  HENT:  Head: Normocephalic and atraumatic.  Right Ear: External ear normal.  Left Ear: External ear normal.       Mild frontal tenderness but not maxillary TMs fine Mild nasal inflammation and swelling but ?small polyp on left Mild pharyngeal injection without exudate   Neck: Normal range of motion. Neck supple.  Pulmonary/Chest: Effort normal. No respiratory distress. He has no rales.       Mildly prolonged exp phase No clear cut wheezing Slight decreased breath sounds  Lymphadenopathy:    He has no cervical adenopathy.          Assessment & Plan:

## 2011-07-06 NOTE — Assessment & Plan Note (Signed)
Not clearly bacterial but could be Will give Rx for amoxil to use if he is not better in 2-3 days

## 2011-07-18 ENCOUNTER — Other Ambulatory Visit: Payer: Self-pay | Admitting: *Deleted

## 2011-07-18 MED ORDER — METHYLPHENIDATE HCL 5 MG PO TABS: 5.0000 mg | ORAL_TABLET | Freq: Two times a day (BID) | ORAL | Status: DC

## 2011-07-18 NOTE — Telephone Encounter (Signed)
Please call pt when ready.

## 2011-07-18 NOTE — Telephone Encounter (Signed)
Left message on machine that rx is ready for pick-up, and it will be at our front desk.  

## 2011-07-27 ENCOUNTER — Other Ambulatory Visit: Payer: Self-pay | Admitting: *Deleted

## 2011-07-27 MED ORDER — METFORMIN HCL 1000 MG PO TABS: ORAL_TABLET | ORAL | Status: DC

## 2011-07-27 MED ORDER — GLIPIZIDE 5 MG PO TABS: 5.0000 mg | ORAL_TABLET | Freq: Two times a day (BID) | ORAL | Status: DC

## 2011-07-27 MED ORDER — OLANZAPINE 10 MG PO TABS: 10.0000 mg | ORAL_TABLET | Freq: Every day | ORAL | Status: DC

## 2011-08-04 ENCOUNTER — Other Ambulatory Visit: Payer: Self-pay | Admitting: *Deleted

## 2011-08-04 MED ORDER — CLONAZEPAM 0.5 MG PO TABS: 0.5000 mg | ORAL_TABLET | Freq: Three times a day (TID) | ORAL | Status: DC | PRN

## 2011-08-04 NOTE — Telephone Encounter (Signed)
rx called into pharmacy

## 2011-08-04 NOTE — Telephone Encounter (Signed)
Okay #90 x 0 

## 2011-08-04 NOTE — Telephone Encounter (Signed)
Last refilled 06/30/11

## 2011-08-15 ENCOUNTER — Other Ambulatory Visit: Payer: Self-pay | Admitting: *Deleted

## 2011-08-15 NOTE — Telephone Encounter (Signed)
Request Ritalin 5 mg [last refill 11.19.12 #60x0]

## 2011-08-16 MED ORDER — METHYLPHENIDATE HCL 5 MG PO TABS: 5.0000 mg | ORAL_TABLET | Freq: Two times a day (BID) | ORAL | Status: DC

## 2011-08-16 NOTE — Telephone Encounter (Signed)
Spoke with patient and advised results   

## 2011-08-31 ENCOUNTER — Other Ambulatory Visit: Payer: Self-pay | Admitting: *Deleted

## 2011-08-31 MED ORDER — CLONAZEPAM 0.5 MG PO TABS: 0.5000 mg | ORAL_TABLET | Freq: Three times a day (TID) | ORAL | Status: DC | PRN

## 2011-08-31 NOTE — Telephone Encounter (Signed)
plz phone in. 

## 2011-08-31 NOTE — Telephone Encounter (Signed)
rx called into pharmacy

## 2011-08-31 NOTE — Telephone Encounter (Signed)
Dr. Letvak patient  

## 2011-09-20 ENCOUNTER — Ambulatory Visit (INDEPENDENT_AMBULATORY_CARE_PROVIDER_SITE_OTHER): Payer: Medicare Other | Admitting: Internal Medicine

## 2011-09-20 ENCOUNTER — Encounter: Payer: Self-pay | Admitting: Internal Medicine

## 2011-09-20 VITALS — BP 110/70 | HR 103 | Temp 98.6°F | Ht 72.0 in | Wt 235.0 lb

## 2011-09-20 DIAGNOSIS — E1129 Type 2 diabetes mellitus with other diabetic kidney complication: Secondary | ICD-10-CM

## 2011-09-20 DIAGNOSIS — F329 Major depressive disorder, single episode, unspecified: Secondary | ICD-10-CM

## 2011-09-20 DIAGNOSIS — E039 Hypothyroidism, unspecified: Secondary | ICD-10-CM

## 2011-09-20 DIAGNOSIS — Z23 Encounter for immunization: Secondary | ICD-10-CM

## 2011-09-20 DIAGNOSIS — F3289 Other specified depressive episodes: Secondary | ICD-10-CM

## 2011-09-20 DIAGNOSIS — E785 Hyperlipidemia, unspecified: Secondary | ICD-10-CM

## 2011-09-20 LAB — LIPID PANEL
Cholesterol: 201 mg/dL — ABNORMAL HIGH (ref 0–200)
HDL: 44.2 mg/dL (ref >39.00)
VLDL: 38.2 mg/dL (ref 0.0–40.0)

## 2011-09-20 LAB — HEPATIC FUNCTION PANEL
Bilirubin, Direct: 0 mg/dL (ref 0.0–0.3)
Total Bilirubin: 0.5 mg/dL (ref 0.3–1.2)

## 2011-09-20 LAB — TSH: TSH: 4.51 u[IU]/mL (ref 0.35–5.50)

## 2011-09-20 LAB — HEMOGLOBIN A1C: Hgb A1c MFr Bld: 7.5 % — ABNORMAL HIGH (ref 4.6–6.5)

## 2011-09-20 LAB — CBC WITH DIFFERENTIAL/PLATELET
Basophils Absolute: 0.1 10*3/uL (ref 0.0–0.1)
Eosinophils Absolute: 0.3 10*3/uL (ref 0.0–0.7)
Lymphocytes Relative: 21.5 % (ref 12.0–46.0)
MCHC: 33.2 g/dL (ref 30.0–36.0)
Neutrophils Relative %: 67.5 % (ref 43.0–77.0)
Platelets: 288 10*3/uL (ref 150.0–400.0)
RBC: 4.91 Mil/uL (ref 4.22–5.81)
RDW: 15.6 % — ABNORMAL HIGH (ref 11.5–14.6)

## 2011-09-20 LAB — LDL CHOLESTEROL, DIRECT: Direct LDL: 133.2 mg/dL

## 2011-09-20 LAB — MICROALBUMIN / CREATININE URINE RATIO
Creatinine,U: 107.2 mg/dL
Microalb Creat Ratio: 2.4 mg/g (ref 0.0–30.0)
Microalb, Ur: 2.6 mg/dL — ABNORMAL HIGH (ref 0.0–1.9)

## 2011-09-20 LAB — BASIC METABOLIC PANEL
CO2: 27 mEq/L (ref 19–32)
Calcium: 9.8 mg/dL (ref 8.4–10.5)
Creatinine, Ser: 1.2 mg/dL (ref 0.4–1.5)

## 2011-09-20 MED ORDER — METHYLPHENIDATE HCL 5 MG PO TABS: 5.0000 mg | ORAL_TABLET | Freq: Two times a day (BID) | ORAL | Status: DC

## 2011-09-20 NOTE — Assessment & Plan Note (Signed)
Hopefully has better control Add januvia if still sig over 8%

## 2011-09-20 NOTE — Assessment & Plan Note (Signed)
Lab Results  Component Value Date   LDLCALC 97 09/23/2009   Last was 119 Will change to atorvastatin if still over 100

## 2011-09-20 NOTE — Assessment & Plan Note (Signed)
Well controlled on current meds. No changes

## 2011-09-20 NOTE — Progress Notes (Signed)
Subjective:    Patient ID: Randy Mcdaniel, male    DOB: 06/04/1941, 71 y.o.   MRN: [id number]  HPI Doing well With wife but she feels he is doing well  Mood has been good Depression is better Stable on meds  Has lost weight despite the winter Checks sugars every 1-2 days Highly variable--  70-180 No hypoglycemic reactions  No problems on the lovastatin Discussed the goal of LDL under 100 No myalgias  Current Outpatient Prescriptions on File Prior to Visit  Medication Sig Dispense Refill  . clonazePAM (KLONOPIN) 0.5 MG tablet Take 1 tablet (0.5 mg total) by mouth 3 (three) times daily as needed.  90 tablet  0  . escitalopram (LEXAPRO) 20 MG tablet Take 20 mg by mouth daily.       Marland Kitchen glipiZIDE (GLUCOTROL) 5 MG tablet Take 1 tablet (5 mg total) by mouth 2 (two) times daily before a meal.  60 tablet  11  . glucose blood test strip Dx: 250.40  100 each  6  . levothyroxine (SYNTHROID, LEVOTHROID) 75 MCG tablet Take 1 tablet (75 mcg total) by mouth daily.  90 tablet  3  . lisinopril (PRINIVIL,ZESTRIL) 10 MG tablet Take 1 tablet (10 mg total) by mouth daily.  90 tablet  3  . lovastatin (MEVACOR) 40 MG tablet Take 1 tablet (40 mg total) by mouth at bedtime.  90 tablet  3  . metFORMIN (GLUCOPHAGE) 1000 MG tablet Take one tablet by mouth in AM, 1/2 tablet by mouth at lunch and one tablet by mouth at bedtime.  225 tablet  11  . OLANZapine (ZYPREXA) 10 MG tablet Take 1 tablet (10 mg total) by mouth at bedtime.  30 tablet  11  . vitamin B-12 (CYANOCOBALAMIN) 1000 MCG tablet Take 1,000 mcg by mouth daily.          Allergies  Allergen Reactions  . Ezetimibe-Simvastatin     REACTION: nausea    Past Medical History  Diagnosis Date  . Anxiety   . Depression     recurrent and severe  . Diabetes mellitus     Type II with nephropathy  . HLD (hyperlipidemia)   . Hypothyroidism   . Osteoarthritis     Past Surgical History  Procedure Date  . Umbilical hernia repair 2000  . Total  knee arthroplasty 11/09    Right-Dr. Remer Macho ([facility])  . Total knee arthroplasty 6/10    Left-Dr. Remer Macho ([facility])    Family History  Problem Relation Age of Onset  . Heart failure Father   . Diabetes Father   . Depression Father   . Breast cancer Sister   . Lung cancer Sister     ?   . Depression Sister   . Coronary artery disease Father   . Stroke      Paternal side  . Parkinsonism      Cousin    History   Social History  . Marital Status: Married    Spouse Name: N/A    Number of Children: 2  . Years of Education: N/A   Occupational History  . Retired    Social History Main Topics  . Smoking status: Never Smoker   . Smokeless tobacco: Former Neurosurgeon    Types: Chew  . Alcohol Use: No  . Drug Use: Not on file  . Sexually Active: Not on file   Other Topics Concern  . Not on file   Social History Narrative  . No narrative on file  Review of Systems Weight is down 9# Sleeping well No regular exercise with the cold weather     Objective:   Physical Exam  Constitutional: He appears well-developed and well-nourished. No distress.  Neck: Normal range of motion. Neck supple. No thyromegaly present.  Cardiovascular: Normal rate, regular rhythm, normal heart sounds and intact distal pulses.  Exam reveals no gallop.   No murmur heard. Pulmonary/Chest: Effort normal and breath sounds normal. No respiratory distress. He has no wheezes. He has no rales.  Musculoskeletal: He exhibits no edema and no tenderness.  Lymphadenopathy:    He has no cervical adenopathy.  Skin:       Scaling and mild callous on feet  Psychiatric: He has a normal mood and affect. His behavior is normal. Judgment and thought content normal.          Assessment & Plan:

## 2011-09-20 NOTE — Assessment & Plan Note (Signed)
Due for labs

## 2011-09-26 ENCOUNTER — Other Ambulatory Visit: Payer: Self-pay | Admitting: *Deleted

## 2011-09-26 MED ORDER — CLONAZEPAM 0.5 MG PO TABS: 0.5000 mg | ORAL_TABLET | Freq: Three times a day (TID) | ORAL | Status: DC | PRN

## 2011-09-26 NOTE — Telephone Encounter (Signed)
Okay to refill #90 x 0 Call to check and make sure he is doing okay though

## 2011-09-26 NOTE — Telephone Encounter (Signed)
rx called into pharmacy

## 2011-09-26 NOTE — Telephone Encounter (Signed)
OK to refill? Last refill 08/31/11

## 2011-09-30 ENCOUNTER — Other Ambulatory Visit: Payer: Medicare Other

## 2011-09-30 DIAGNOSIS — Z1211 Encounter for screening for malignant neoplasm of colon: Secondary | ICD-10-CM

## 2011-09-30 LAB — FECAL OCCULT BLOOD, IMMUNOCHEMICAL: Fecal Occult Bld: NEGATIVE

## 2011-10-03 ENCOUNTER — Encounter: Payer: Self-pay | Admitting: *Deleted

## 2011-10-03 ENCOUNTER — Other Ambulatory Visit: Payer: Self-pay | Admitting: *Deleted

## 2011-10-03 ENCOUNTER — Telehealth: Payer: Self-pay | Admitting: *Deleted

## 2011-10-03 MED ORDER — ATORVASTATIN CALCIUM 20 MG PO TABS: 20.0000 mg | ORAL_TABLET | Freq: Every day | ORAL | Status: DC

## 2011-10-03 NOTE — Telephone Encounter (Signed)
Message copied by Sueanne Margarita on Mon Oct 03, 2011  2:12 PM ------      Message from: Tillman Abide I      Created: Wed Sep 21, 2011  8:05 AM       Please call      The blood work is all good except the chol is not at goal. The total is 201 and the LDL or bad chol 133. Please stop the lovastatin and start atorvastatin 20mg  daily (1 year Rx). Set up BW in about 6 weeks---hepatic and lipid (272.4)      Diabetic control is considerably better with HgbA1c down to 7.5%. Good work---no changes needed      Urine and blood tests show no signs of diabetic kidney problems      Blood count, liver and thyroid are all normal

## 2011-10-03 NOTE — Telephone Encounter (Signed)
rx sent to pharmacy by e-script, regina spoke with patient about labs

## 2011-10-03 NOTE — Telephone Encounter (Signed)
Spoke with patient and advised results, he wanted to know about his stool kit, which was negative

## 2011-10-03 NOTE — Telephone Encounter (Signed)
Questions about labs

## 2011-10-05 ENCOUNTER — Telehealth: Payer: Self-pay | Admitting: *Deleted

## 2011-10-05 NOTE — Telephone Encounter (Signed)
Prior auth form on your desk for Methylphenidate.

## 2011-10-06 NOTE — Telephone Encounter (Signed)
Form done 

## 2011-10-06 NOTE — Telephone Encounter (Signed)
Form faxed back.

## 2011-10-17 ENCOUNTER — Other Ambulatory Visit: Payer: Self-pay | Admitting: Internal Medicine

## 2011-10-17 MED ORDER — METHYLPHENIDATE HCL 5 MG PO TABS: 5.0000 mg | ORAL_TABLET | Freq: Two times a day (BID) | ORAL | Status: DC

## 2011-10-17 MED ORDER — LISINOPRIL 10 MG PO TABS: 10.0000 mg | ORAL_TABLET | Freq: Every day | ORAL | Status: DC

## 2011-10-17 NOTE — Telephone Encounter (Signed)
Patient called requesting a refill on his Ritalin.  Please call patient when ready to pick up.

## 2011-10-17 NOTE — Telephone Encounter (Signed)
Received fax refill request. Refill sent electronically.

## 2011-10-17 NOTE — Telephone Encounter (Signed)
Spoke with patient and advised rx for Ritalin ready for pick-up and it will be at the front desk.

## 2011-10-25 ENCOUNTER — Other Ambulatory Visit: Payer: Self-pay | Admitting: *Deleted

## 2011-10-25 MED ORDER — CLONAZEPAM 0.5 MG PO TABS: 0.5000 mg | ORAL_TABLET | Freq: Three times a day (TID) | ORAL | Status: DC | PRN

## 2011-10-25 NOTE — Telephone Encounter (Signed)
rx called into pharmacy

## 2011-10-25 NOTE — Telephone Encounter (Signed)
Okay #90 x 0 

## 2011-11-14 ENCOUNTER — Other Ambulatory Visit: Payer: Self-pay | Admitting: *Deleted

## 2011-11-14 MED ORDER — METHYLPHENIDATE HCL 5 MG PO TABS: 5.0000 mg | ORAL_TABLET | Freq: Two times a day (BID) | ORAL | Status: DC

## 2011-11-14 NOTE — Telephone Encounter (Signed)
Spoke with patient and advised rx ready for pick-up and it will be at the front desk.  

## 2011-11-23 ENCOUNTER — Other Ambulatory Visit: Payer: Self-pay | Admitting: Internal Medicine

## 2011-11-23 DIAGNOSIS — E785 Hyperlipidemia, unspecified: Secondary | ICD-10-CM

## 2011-11-28 ENCOUNTER — Telehealth: Payer: Self-pay

## 2011-11-28 NOTE — Telephone Encounter (Signed)
Pt states has lab appt in AM and wants to make sure orders are in and the lab will know what to do. I spoke with Rodney Booze and labs have been ordered and pt needs to come fasting. Pt notified as instructed.

## 2011-11-29 ENCOUNTER — Other Ambulatory Visit (INDEPENDENT_AMBULATORY_CARE_PROVIDER_SITE_OTHER): Payer: Medicare Other

## 2011-11-29 ENCOUNTER — Encounter: Payer: Self-pay | Admitting: *Deleted

## 2011-11-29 DIAGNOSIS — E785 Hyperlipidemia, unspecified: Secondary | ICD-10-CM

## 2011-11-29 LAB — HEPATIC FUNCTION PANEL
ALT: 18 U/L (ref 0–53)
AST: 28 U/L (ref 0–37)
Alkaline Phosphatase: 44 U/L (ref 39–117)
Bilirubin, Direct: 0.1 mg/dL (ref 0.0–0.3)
Total Bilirubin: 0.3 mg/dL (ref 0.3–1.2)
Total Protein: 7.5 g/dL (ref 6.0–8.3)

## 2011-11-29 LAB — LIPID PANEL
Cholesterol: 146 mg/dL (ref 0–200)
VLDL: 34.2 mg/dL (ref 0.0–40.0)

## 2011-12-01 ENCOUNTER — Other Ambulatory Visit: Payer: Self-pay

## 2011-12-01 MED ORDER — CLONAZEPAM 0.5 MG PO TABS: 0.5000 mg | ORAL_TABLET | Freq: Three times a day (TID) | ORAL | Status: DC | PRN

## 2011-12-01 NOTE — Telephone Encounter (Signed)
Pt said [place] pharmacy faxed request x 2 for Clonazepam 0.5 mg with no response, I apologized but could not find faxed request but I will send request to Dr Alphonsus Sias now and Dr Alphonsus Sias will be in office this afternoon. Pt last seen 09/20/11. Pt can be reached at [phone].

## 2011-12-01 NOTE — Telephone Encounter (Signed)
rx called into pharmacy

## 2011-12-01 NOTE — Telephone Encounter (Signed)
Okay #90 x 1 

## 2011-12-12 ENCOUNTER — Other Ambulatory Visit: Payer: Self-pay

## 2011-12-12 NOTE — Telephone Encounter (Signed)
Pt request written rx Ritalin 5 mg. Pt last seen 09/20/11. Pt can be reached [phone] when rx ready for pick up.

## 2011-12-13 MED ORDER — METHYLPHENIDATE HCL 5 MG PO TABS: 5.0000 mg | ORAL_TABLET | Freq: Two times a day (BID) | ORAL | Status: DC

## 2011-12-13 NOTE — Telephone Encounter (Signed)
Spoke with patient and advised rx ready for pick-up and it will be at the front desk.  

## 2012-01-16 ENCOUNTER — Other Ambulatory Visit: Payer: Self-pay | Admitting: *Deleted

## 2012-01-16 MED ORDER — METHYLPHENIDATE HCL 5 MG PO TABS: 5.0000 mg | ORAL_TABLET | Freq: Two times a day (BID) | ORAL | Status: DC

## 2012-01-16 NOTE — Telephone Encounter (Signed)
Spoke with patient and advised rx ready for pick-up and it will be at the front desk.  

## 2012-01-30 ENCOUNTER — Other Ambulatory Visit: Payer: Self-pay | Admitting: *Deleted

## 2012-01-31 ENCOUNTER — Other Ambulatory Visit: Payer: Self-pay

## 2012-01-31 MED ORDER — ESCITALOPRAM OXALATE 20 MG PO TABS: 40.0000 mg | ORAL_TABLET | Freq: Every day | ORAL | Status: DC

## 2012-01-31 MED ORDER — CLONAZEPAM 0.5 MG PO TABS: 0.5000 mg | ORAL_TABLET | Freq: Three times a day (TID) | ORAL | Status: DC | PRN

## 2012-01-31 NOTE — Telephone Encounter (Signed)
Pt request refill Lexapro 20 mg taking 40 mg in AM. On med list is 20 mg daily. Please advise. [facility] pharmacy.

## 2012-01-31 NOTE — Telephone Encounter (Signed)
Okay #90 x 1 

## 2012-01-31 NOTE — Telephone Encounter (Signed)
rx sent to pharmacy by e-script There is no 40mg  lexapro, sent 20mg  bid

## 2012-01-31 NOTE — Telephone Encounter (Signed)
rx called into pharmacy

## 2012-01-31 NOTE — Telephone Encounter (Signed)
Okay to fill for a year at the 40mg  daily dose

## 2012-02-13 ENCOUNTER — Other Ambulatory Visit: Payer: Self-pay | Admitting: *Deleted

## 2012-02-13 MED ORDER — METHYLPHENIDATE HCL 5 MG PO TABS: 5.0000 mg | ORAL_TABLET | Freq: Two times a day (BID) | ORAL | Status: DC

## 2012-02-13 NOTE — Telephone Encounter (Signed)
Patient called requesting refill. Please call when ready for pick up.

## 2012-02-13 NOTE — Telephone Encounter (Signed)
Spoke with patient and advised rx ready for pick-up and it will be at the front desk.  

## 2012-03-16 ENCOUNTER — Ambulatory Visit (INDEPENDENT_AMBULATORY_CARE_PROVIDER_SITE_OTHER): Payer: Medicare Other | Admitting: Internal Medicine

## 2012-03-16 ENCOUNTER — Encounter: Payer: Self-pay | Admitting: Internal Medicine

## 2012-03-16 VITALS — BP 138/80 | HR 107 | Temp 97.5°F | Ht 72.0 in | Wt 230.0 lb

## 2012-03-16 DIAGNOSIS — N058 Unspecified nephritic syndrome with other morphologic changes: Secondary | ICD-10-CM

## 2012-03-16 DIAGNOSIS — R799 Abnormal finding of blood chemistry, unspecified: Secondary | ICD-10-CM

## 2012-03-16 DIAGNOSIS — R7989 Other specified abnormal findings of blood chemistry: Secondary | ICD-10-CM

## 2012-03-16 DIAGNOSIS — E785 Hyperlipidemia, unspecified: Secondary | ICD-10-CM

## 2012-03-16 DIAGNOSIS — F329 Major depressive disorder, single episode, unspecified: Secondary | ICD-10-CM

## 2012-03-16 DIAGNOSIS — E1129 Type 2 diabetes mellitus with other diabetic kidney complication: Secondary | ICD-10-CM

## 2012-03-16 MED ORDER — METHYLPHENIDATE HCL 5 MG PO TABS: 5.0000 mg | ORAL_TABLET | Freq: Two times a day (BID) | ORAL | Status: DC

## 2012-03-16 NOTE — Assessment & Plan Note (Signed)
Lab Results  Component Value Date   LDLCALC 72 11/29/2011   No problems with the atorvastatin Will continue

## 2012-03-16 NOTE — Assessment & Plan Note (Signed)
Fairly well controlled Will need lifelong Rx

## 2012-03-16 NOTE — Assessment & Plan Note (Signed)
Seems to still have good control Discussed fitness and doing some activity---he is seriously deconditioned Lab Results  Component Value Date   HGBA1C 7.5* 09/20/2011

## 2012-03-16 NOTE — Progress Notes (Signed)
Subjective:    Patient ID: Randy Mcdaniel, male    DOB: 1941/02/22, 71 y.o.   MRN: [id number]  HPI Here with wife No trouble with new cholesterol med  Still has some bad days but no more than 1-2 together Mood generally okay Nerves have been okay  Diabetes has been okay Checks sugars 1-2 per week 78-125 random (fastings usually excellent) Up to 188 after corn Due for eye exam No hypoglycemia  Some hip pain Uses cane for walking Current Outpatient Prescriptions on File Prior to Visit  Medication Sig Dispense Refill  . atorvastatin (LIPITOR) 20 MG tablet Take 1 tablet (20 mg total) by mouth daily.  90 tablet  3  . clonazePAM (KLONOPIN) 0.5 MG tablet Take 1 tablet (0.5 mg total) by mouth 3 (three) times daily as needed.  90 tablet  1  . escitalopram (LEXAPRO) 20 MG tablet Take 2 tablets (40 mg total) by mouth daily.  60 tablet  11  . glipiZIDE (GLUCOTROL) 5 MG tablet Take 1 tablet (5 mg total) by mouth 2 (two) times daily before a meal.  60 tablet  11  . glucose blood test strip Dx: 250.40  100 each  6  . levothyroxine (SYNTHROID, LEVOTHROID) 75 MCG tablet Take 1 tablet (75 mcg total) by mouth daily.  90 tablet  3  . lisinopril (PRINIVIL,ZESTRIL) 10 MG tablet Take 1 tablet (10 mg total) by mouth daily.  90 tablet  1  . metFORMIN (GLUCOPHAGE) 1000 MG tablet Take one tablet by mouth in AM, 1/2 tablet by mouth at lunch and one tablet by mouth at bedtime.  225 tablet  11  . methylphenidate (RITALIN) 5 MG tablet Take 1 tablet (5 mg total) by mouth 2 (two) times daily.  60 tablet  0  . OLANZapine (ZYPREXA) 10 MG tablet Take 1 tablet (10 mg total) by mouth at bedtime.  30 tablet  11  . vitamin B-12 (CYANOCOBALAMIN) 1000 MCG tablet Take 1,000 mcg by mouth daily.        Marland Kitchen DISCONTD: methylphenidate (RITALIN) 5 MG tablet Take 1 tablet (5 mg total) by mouth 2 (two) times daily.  60 tablet  0    Allergies  Allergen Reactions  . Ezetimibe-Simvastatin     REACTION: nausea    Past Medical  History  Diagnosis Date  . Anxiety   . Depression     recurrent and severe  . Diabetes mellitus     Type II with nephropathy  . HLD (hyperlipidemia)   . Hypothyroidism   . Osteoarthritis     Past Surgical History  Procedure Date  . Umbilical hernia repair 2000  . 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 Remer Macho ([facility])  . Total knee arthroplasty 6/10    Left-Dr. Remer Macho ([facility])    Family History  Problem Relation Age of Onset  . Heart failure Father   . Diabetes Father   . Depression Father   . Breast cancer Sister   . Lung cancer Sister     ?   . Depression Sister   . Coronary artery disease Father   . Stroke      Paternal side  . Parkinsonism      Cousin    History   Social History  . Marital Status: Married    Spouse Name: N/A    Number of Children: 2  . Years of Education: N/A   Occupational History  . Retired    Social History Main Topics  .  Smoking status: Never Smoker   . Smokeless tobacco: Former Neurosurgeon    Types: Chew  . Alcohol Use: No  . Drug Use: Not on file  . Sexually Active: Not on file   Other Topics Concern  . Not on file   Social History Narrative  . No narrative on file   Review of Systems Sleeping well Watching his diet somewhat ---really does move around much at all Weight down 5#    Objective:   Physical Exam  Constitutional: He appears well-developed and well-nourished. No distress.  Neck: Normal range of motion. Neck supple. No thyromegaly present.  Cardiovascular: Normal rate, regular rhythm and normal heart sounds.  Exam reveals no gallop.   No murmur heard.      Faint pedal pulses  Pulmonary/Chest: Effort normal and breath sounds normal. No respiratory distress. He has no wheezes. He has no rales.  Musculoskeletal: He exhibits no edema and no tenderness.  Lymphadenopathy:    He has no cervical adenopathy.  Neurological:       Slow walking Heart rate up just getting up on table  Psychiatric: He has a normal mood and  affect. His behavior is normal.          Assessment & Plan:

## 2012-03-19 ENCOUNTER — Encounter: Payer: Self-pay | Admitting: *Deleted

## 2012-03-27 ENCOUNTER — Other Ambulatory Visit: Payer: Self-pay | Admitting: *Deleted

## 2012-03-27 MED ORDER — CLONAZEPAM 0.5 MG PO TABS: 0.5000 mg | ORAL_TABLET | Freq: Three times a day (TID) | ORAL | Status: DC | PRN

## 2012-03-27 NOTE — Telephone Encounter (Signed)
Received refill request from pharmacy. Last office visit 03/16/12. Is it okay to refill medication?

## 2012-03-27 NOTE — Telephone Encounter (Signed)
Okay #90 x 1 

## 2012-03-27 NOTE — Telephone Encounter (Signed)
Medication phoned to pharmacy.  

## 2012-04-16 ENCOUNTER — Other Ambulatory Visit: Payer: Self-pay

## 2012-04-16 MED ORDER — METHYLPHENIDATE HCL 5 MG PO TABS: 5.0000 mg | ORAL_TABLET | Freq: Two times a day (BID) | ORAL | Status: DC

## 2012-04-16 NOTE — Telephone Encounter (Signed)
Pt request rx Ritalin. Call when ready for pick up. 

## 2012-04-16 NOTE — Telephone Encounter (Signed)
Spoke with patient and advised rx ready for pick-up and it will be at the front desk.  

## 2012-04-17 ENCOUNTER — Other Ambulatory Visit: Payer: Self-pay | Admitting: *Deleted

## 2012-04-17 MED ORDER — LISINOPRIL 10 MG PO TABS: 10.0000 mg | ORAL_TABLET | Freq: Every day | ORAL | Status: DC

## 2012-05-14 ENCOUNTER — Other Ambulatory Visit: Payer: Self-pay | Admitting: *Deleted

## 2012-05-14 MED ORDER — METHYLPHENIDATE HCL 5 MG PO TABS: 5.0000 mg | ORAL_TABLET | Freq: Two times a day (BID) | ORAL | Status: DC

## 2012-05-14 NOTE — Telephone Encounter (Signed)
Left message on machine that rx is ready for pick-up, and it will be at our front desk.  

## 2012-05-15 NOTE — Telephone Encounter (Signed)
Pt left v/m requesting status of rx. Patient notified as instructed by telephone v/m that rx is at front desk for pick up.

## 2012-05-28 ENCOUNTER — Other Ambulatory Visit: Payer: Self-pay | Admitting: *Deleted

## 2012-05-29 MED ORDER — CLONAZEPAM 0.5 MG PO TABS: 0.5000 mg | ORAL_TABLET | Freq: Three times a day (TID) | ORAL | Status: DC | PRN

## 2012-05-29 NOTE — Telephone Encounter (Signed)
rx called into pharmacy

## 2012-05-29 NOTE — Telephone Encounter (Signed)
Okay #90 x 1 

## 2012-06-11 ENCOUNTER — Other Ambulatory Visit: Payer: Self-pay

## 2012-06-11 LAB — HM DIABETES EYE EXAM

## 2012-06-11 NOTE — Telephone Encounter (Signed)
Pt request rx Ritalin. Call pt when ready for pick up. Pt wants to wait for Dr Karle Starch return on 06/13/12.

## 2012-06-13 MED ORDER — METHYLPHENIDATE HCL 5 MG PO TABS: 5.0000 mg | ORAL_TABLET | Freq: Two times a day (BID) | ORAL | Status: DC

## 2012-06-13 NOTE — Telephone Encounter (Signed)
Spoke with patient and advised rx ready for pick-up and it will be at the front desk.  

## 2012-06-14 ENCOUNTER — Other Ambulatory Visit: Payer: Self-pay | Admitting: *Deleted

## 2012-06-14 MED ORDER — LEVOTHYROXINE SODIUM 75 MCG PO TABS: 75.0000 ug | ORAL_TABLET | Freq: Every day | ORAL | Status: DC

## 2012-06-27 ENCOUNTER — Ambulatory Visit (INDEPENDENT_AMBULATORY_CARE_PROVIDER_SITE_OTHER): Payer: Medicare Other

## 2012-06-27 DIAGNOSIS — Z23 Encounter for immunization: Secondary | ICD-10-CM

## 2012-07-16 ENCOUNTER — Other Ambulatory Visit: Payer: Self-pay

## 2012-07-16 MED ORDER — METHYLPHENIDATE HCL 5 MG PO TABS: 5.0000 mg | ORAL_TABLET | Freq: Two times a day (BID) | ORAL | Status: DC

## 2012-07-16 NOTE — Telephone Encounter (Signed)
Spoke with patient and advised rx ready for pick-up and it will be at the front desk.  

## 2012-07-16 NOTE — Telephone Encounter (Signed)
Pt left v/m requesting rx Ritalin.call when ready for pick up.

## 2012-07-23 ENCOUNTER — Other Ambulatory Visit: Payer: Self-pay | Admitting: *Deleted

## 2012-07-23 MED ORDER — GLIPIZIDE 5 MG PO TABS: 5.0000 mg | ORAL_TABLET | Freq: Two times a day (BID) | ORAL | Status: DC

## 2012-07-23 MED ORDER — OLANZAPINE 10 MG PO TABS: 10.0000 mg | ORAL_TABLET | Freq: Every day | ORAL | Status: DC

## 2012-07-24 ENCOUNTER — Other Ambulatory Visit: Payer: Self-pay | Admitting: *Deleted

## 2012-07-24 MED ORDER — CLONAZEPAM 0.5 MG PO TABS: 0.5000 mg | ORAL_TABLET | Freq: Three times a day (TID) | ORAL | Status: DC | PRN

## 2012-07-24 NOTE — Telephone Encounter (Signed)
Please call in

## 2012-07-25 NOTE — Telephone Encounter (Signed)
rx called into pharmacy

## 2012-08-13 ENCOUNTER — Other Ambulatory Visit: Payer: Self-pay

## 2012-08-13 MED ORDER — METHYLPHENIDATE HCL 5 MG PO TABS: 5.0000 mg | ORAL_TABLET | Freq: Two times a day (BID) | ORAL | Status: DC

## 2012-08-13 NOTE — Telephone Encounter (Signed)
Pt request rx Ritalin. Call when ready for pick up. 

## 2012-08-13 NOTE — Telephone Encounter (Signed)
Spoke with patient and advised rx ready for pick-up and it will be at the front desk.  

## 2012-08-28 ENCOUNTER — Other Ambulatory Visit: Payer: Self-pay | Admitting: *Deleted

## 2012-08-28 NOTE — Telephone Encounter (Signed)
OK to refill? Last filled 07/24/12 and last OV 03/16/12.

## 2012-08-30 MED ORDER — CLONAZEPAM 0.5 MG PO TABS: 0.5000 mg | ORAL_TABLET | Freq: Three times a day (TID) | ORAL | Status: DC | PRN

## 2012-08-30 NOTE — Telephone Encounter (Signed)
Medication phoned to [facility] pharmacy as instructed. Pt called requested status of refill; advised done.

## 2012-09-10 ENCOUNTER — Other Ambulatory Visit: Payer: Self-pay | Admitting: Family Medicine

## 2012-09-10 MED ORDER — METHYLPHENIDATE HCL 5 MG PO TABS: 5.0000 mg | ORAL_TABLET | Freq: Two times a day (BID) | ORAL | Status: DC

## 2012-09-10 NOTE — Telephone Encounter (Signed)
Left message on machine that rx is ready for pick-up, and it will be at our front desk.  

## 2012-09-10 NOTE — Telephone Encounter (Signed)
Pt request RX Ritalin. Call when ready for pick up.

## 2012-09-24 ENCOUNTER — Other Ambulatory Visit: Payer: Self-pay | Admitting: *Deleted

## 2012-09-24 MED ORDER — ATORVASTATIN CALCIUM 20 MG PO TABS: 20.0000 mg | ORAL_TABLET | Freq: Every day | ORAL | Status: DC

## 2012-09-25 ENCOUNTER — Other Ambulatory Visit: Payer: Self-pay | Admitting: *Deleted

## 2012-09-25 MED ORDER — CLONAZEPAM 0.5 MG PO TABS: 0.5000 mg | ORAL_TABLET | Freq: Three times a day (TID) | ORAL | Status: DC | PRN

## 2012-09-25 NOTE — Telephone Encounter (Signed)
rx called into pharmacy

## 2012-09-25 NOTE — Telephone Encounter (Signed)
Okay #90 x 0 

## 2012-09-25 NOTE — Telephone Encounter (Signed)
Last filled 12/14

## 2012-10-16 ENCOUNTER — Encounter: Payer: Self-pay | Admitting: Internal Medicine

## 2012-10-16 ENCOUNTER — Ambulatory Visit (INDEPENDENT_AMBULATORY_CARE_PROVIDER_SITE_OTHER): Payer: Medicare Other | Admitting: Internal Medicine

## 2012-10-16 VITALS — BP 118/80 | HR 108 | Temp 98.2°F | Ht 72.0 in | Wt 225.0 lb

## 2012-10-16 DIAGNOSIS — R1031 Right lower quadrant pain: Secondary | ICD-10-CM

## 2012-10-16 DIAGNOSIS — E785 Hyperlipidemia, unspecified: Secondary | ICD-10-CM

## 2012-10-16 DIAGNOSIS — Z0000 Encounter for general adult medical examination without abnormal findings: Secondary | ICD-10-CM

## 2012-10-16 DIAGNOSIS — Z1211 Encounter for screening for malignant neoplasm of colon: Secondary | ICD-10-CM

## 2012-10-16 DIAGNOSIS — F3289 Other specified depressive episodes: Secondary | ICD-10-CM

## 2012-10-16 DIAGNOSIS — E039 Hypothyroidism, unspecified: Secondary | ICD-10-CM

## 2012-10-16 DIAGNOSIS — E1129 Type 2 diabetes mellitus with other diabetic kidney complication: Secondary | ICD-10-CM

## 2012-10-16 DIAGNOSIS — F329 Major depressive disorder, single episode, unspecified: Secondary | ICD-10-CM

## 2012-10-16 DIAGNOSIS — R350 Frequency of micturition: Secondary | ICD-10-CM

## 2012-10-16 LAB — CBC WITH DIFFERENTIAL/PLATELET
Basophils Absolute: 0.1 10*3/uL (ref 0.0–0.1)
HCT: 42 % (ref 39.0–52.0)
Lymphs Abs: 1.6 10*3/uL (ref 0.7–4.0)
Monocytes Absolute: 0.6 10*3/uL (ref 0.1–1.0)
Monocytes Relative: 6.2 % (ref 3.0–12.0)
Neutrophils Relative %: 74.5 % (ref 43.0–77.0)
Platelets: 279 10*3/uL (ref 150.0–400.0)
RDW: 15.3 % — ABNORMAL HIGH (ref 11.5–14.6)

## 2012-10-16 LAB — LIPID PANEL
Cholesterol: 172 mg/dL (ref 0–200)
HDL: 38.4 mg/dL — ABNORMAL LOW (ref >39.00)
Total CHOL/HDL Ratio: 4
Triglycerides: 216 mg/dL — ABNORMAL HIGH (ref 0.0–149.0)
VLDL: 43.2 mg/dL — ABNORMAL HIGH (ref 0.0–40.0)

## 2012-10-16 LAB — HEPATIC FUNCTION PANEL
Albumin: 4.4 g/dL (ref 3.5–5.2)
Alkaline Phosphatase: 54 U/L (ref 39–117)
Total Bilirubin: 0.6 mg/dL (ref 0.3–1.2)

## 2012-10-16 LAB — TSH: TSH: 3.12 u[IU]/mL (ref 0.35–5.50)

## 2012-10-16 LAB — BASIC METABOLIC PANEL
BUN: 19 mg/dL (ref 6–23)
Calcium: 9.8 mg/dL (ref 8.4–10.5)
Creatinine, Ser: 1.1 mg/dL (ref 0.4–1.5)
GFR: 68.32 mL/min (ref >60.00)
Glucose, Bld: 163 mg/dL — ABNORMAL HIGH (ref 70–99)
Potassium: 4.8 mEq/L (ref 3.5–5.1)

## 2012-10-16 LAB — T4, FREE: Free T4: 0.88 ng/dL (ref 0.60–1.60)

## 2012-10-16 LAB — HM DIABETES FOOT EXAM

## 2012-10-16 LAB — LDL CHOLESTEROL, DIRECT: Direct LDL: 101.6 mg/dL

## 2012-10-16 LAB — HEMOGLOBIN A1C: Hgb A1c MFr Bld: 7.3 % — ABNORMAL HIGH (ref 4.6–6.5)

## 2012-10-16 MED ORDER — METHYLPHENIDATE HCL 5 MG PO TABS: 5.0000 mg | ORAL_TABLET | Freq: Two times a day (BID) | ORAL | Status: DC

## 2012-10-16 NOTE — Assessment & Plan Note (Signed)
No problems with med Recheck labs

## 2012-10-16 NOTE — Assessment & Plan Note (Signed)
Seems to have good control Will check labs 

## 2012-10-16 NOTE — Progress Notes (Signed)
Subjective:    Patient ID: Randy Mcdaniel, male    DOB: 1941/02/01, 72 y.o.   MRN: [id number]  HPI Here for Wellness visit and follow up Only sees eye doctor No falls Independent with ADLs, still drives Uses cane to walk when outside--especially on unlevel ground Non smoker and doesn't drink Mild hearing problems Vision is fine---keeps up with eye doctor No memory or thinking problems  Depression is controlled Not anhedonic Wife feels things are okay  Checks sugars 2-3 times per week Usually 80-120 No significant hypoglycemic reactions  Seemed to pull right groin yesterday No known injury  Gets cramps in right leg at night Pain in left thigh and calf with walking Will actually get pain in groin and around the back   Current Outpatient Prescriptions on File Prior to Visit  Medication Sig Dispense Refill  . atorvastatin (LIPITOR) 20 MG tablet Take 1 tablet (20 mg total) by mouth daily.  90 tablet  3  . clonazePAM (KLONOPIN) 0.5 MG tablet Take 1 tablet (0.5 mg total) by mouth 3 (three) times daily as needed.  90 tablet  0  . glipiZIDE (GLUCOTROL) 5 MG tablet Take 1 tablet (5 mg total) by mouth 2 (two) times daily before a meal.  60 tablet  11  . glucose blood test strip Dx: 250.40  100 each  6  . levothyroxine (SYNTHROID, LEVOTHROID) 75 MCG tablet Take 1 tablet (75 mcg total) by mouth daily.  90 tablet  3  . lisinopril (PRINIVIL,ZESTRIL) 10 MG tablet Take 1 tablet (10 mg total) by mouth daily.  90 tablet  3  . metFORMIN (GLUCOPHAGE) 1000 MG tablet Take one tablet by mouth in AM, 1/2 tablet by mouth at lunch and one tablet by mouth at bedtime.  225 tablet  11  . methylphenidate (RITALIN) 5 MG tablet Take 1 tablet (5 mg total) by mouth 2 (two) times daily.  60 tablet  0  . OLANZapine (ZYPREXA) 10 MG tablet Take 1 tablet (10 mg total) by mouth at bedtime.  30 tablet  11  . vitamin B-12 (CYANOCOBALAMIN) 1000 MCG tablet Take 1,000 mcg by mouth daily.         No current  facility-administered medications on file prior to visit.    Allergies  Allergen Reactions  . Ezetimibe-Simvastatin     REACTION: nausea    Past Medical History  Diagnosis Date  . Anxiety   . Depression     recurrent and severe  . Diabetes mellitus     Type II with nephropathy  . HLD (hyperlipidemia)   . Hypothyroidism   . Osteoarthritis     Past Surgical History  Procedure Laterality Date  . Umbilical hernia repair  2000  . 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 Remer Macho ([facility])  . Total knee arthroplasty  6/10    Left-Dr. Remer Macho ([facility])    Family History  Problem Relation Age of Onset  . Heart failure Father   . Diabetes Father   . Depression Father   . Breast cancer Sister   . Lung cancer Sister     ?   . Depression Sister   . Coronary artery disease Father   . Stroke      Paternal side  . Parkinsonism      Cousin    History   Social History  . Marital Status: Married    Spouse Name: N/A    Number of Children: 2  . Years of Education: N/A  Occupational History  . Retired    Social History Main Topics  . Smoking status: Never Smoker   . Smokeless tobacco: Former Neurosurgeon    Types: Chew  . Alcohol Use: No  . Drug Use: Not on file  . Sexually Active: Not on file   Other Topics Concern  . Not on file   Social History Narrative   Has living will.   No formal health care POA---requests wife to be   Would accept resuscitation attempts   Not sure about tube feeds   Review of Systems Trying to eat well--has lost a few pounds Sleeps well No skin problems Bowels are slow--hasn't used meds for this    Objective:   Physical Exam  Constitutional: He is oriented to person, place, and time. He appears well-developed and well-nourished. No distress.  Neck: Normal range of motion. Neck supple. No thyromegaly present.  Cardiovascular: Normal rate, regular rhythm, normal heart sounds and intact distal pulses.  Exam reveals no gallop.   No murmur  heard. Pulmonary/Chest: Effort normal and breath sounds normal. No respiratory distress. He has no wheezes. He has no rales.  Genitourinary:  No inguinal hernia Pain is along inguinal ligament but not really tender  Musculoskeletal: He exhibits no edema and no tenderness.  Lymphadenopathy:    He has no cervical adenopathy.  Neurological: He is alert and oriented to person, place, and time. He exhibits normal muscle tone.  President-- "Fay Records, Felix Pacini, Bill Clinton" [phone] [id number] Recall 2/3  No focal weakness  Skin: No rash noted. No erythema.  Mild scaling on feet  Psychiatric: He has a normal mood and affect. His behavior is normal.          Assessment & Plan:

## 2012-10-16 NOTE — Assessment & Plan Note (Addendum)
I have personally reviewed the Medicare Annual Wellness questionnaire and have noted 1. The patient's medical and social history 2. Their use of alcohol, tobacco or illicit drugs 3. Their current medications and supplements 4. The patient's functional ability including ADL's, fall risks, home safety risks and hearing or visual             impairment. 5. Diet and physical activities 6. Evidence for depression or mood disorders  The patients weight, height, BMI and visual acuity have been recorded in the chart I have made referrals, counseling and provided education to the patient based review of the above and I have provided the pt with a written personalized care plan for preventive services.  I have provided you with a copy of your personalized plan for preventive services. Please take the time to review along with your updated medication list.  Discussed PSA--he wants to get Will send for colonoscopy Rx given for zostavax

## 2012-10-16 NOTE — Assessment & Plan Note (Signed)
Could be muscle pull but more likely nerve compression at S1 or could be sciatic This is probably causing the claudication type symptoms (since pulses are quite strong) He gets some relief with aleve Discussed regular walking

## 2012-10-16 NOTE — Assessment & Plan Note (Signed)
Has been basically in remission Need to continue meds, including zyprexa and ritalin. Weaning has caused recurrences

## 2012-10-18 ENCOUNTER — Encounter: Payer: Self-pay | Admitting: *Deleted

## 2012-10-22 ENCOUNTER — Other Ambulatory Visit: Payer: Self-pay | Admitting: *Deleted

## 2012-10-22 MED ORDER — METFORMIN HCL 1000 MG PO TABS: ORAL_TABLET | ORAL | Status: DC

## 2012-10-29 ENCOUNTER — Other Ambulatory Visit: Payer: Self-pay | Admitting: *Deleted

## 2012-10-29 MED ORDER — CLONAZEPAM 0.5 MG PO TABS: 0.5000 mg | ORAL_TABLET | Freq: Three times a day (TID) | ORAL | Status: DC | PRN

## 2012-10-29 NOTE — Telephone Encounter (Signed)
Last filled 08/05/13

## 2012-10-29 NOTE — Telephone Encounter (Signed)
rx called into pharmacy

## 2012-10-29 NOTE — Telephone Encounter (Signed)
Okay #90 x 0 

## 2012-11-12 ENCOUNTER — Other Ambulatory Visit: Payer: Self-pay

## 2012-11-12 MED ORDER — METHYLPHENIDATE HCL 5 MG PO TABS: 5.0000 mg | ORAL_TABLET | Freq: Two times a day (BID) | ORAL | Status: DC

## 2012-11-12 NOTE — Telephone Encounter (Signed)
Pt left v/m requesting rx ritalin. Call when ready for pick up. 

## 2012-11-12 NOTE — Telephone Encounter (Signed)
Spoke with patient and advised rx ready for pick-up and it will be at the front desk.  

## 2012-11-19 ENCOUNTER — Encounter: Payer: Self-pay | Admitting: Internal Medicine

## 2012-11-19 ENCOUNTER — Ambulatory Visit (AMBULATORY_SURGERY_CENTER): Payer: Medicare Other

## 2012-11-19 VITALS — Ht 71.0 in | Wt 226.0 lb

## 2012-11-19 DIAGNOSIS — Z1211 Encounter for screening for malignant neoplasm of colon: Secondary | ICD-10-CM

## 2012-11-19 MED ORDER — NA SULFATE-K SULFATE-MG SULF 17.5-3.13-1.6 GM/177ML PO SOLN: 1.0000 | Freq: Once | ORAL | Status: DC

## 2012-11-22 ENCOUNTER — Other Ambulatory Visit: Payer: Self-pay | Admitting: Internal Medicine

## 2012-11-22 NOTE — Telephone Encounter (Signed)
Last refilled 10/28/12

## 2012-11-23 MED ORDER — CLONAZEPAM 0.5 MG PO TABS: 0.5000 mg | ORAL_TABLET | Freq: Three times a day (TID) | ORAL | Status: DC | PRN

## 2012-11-23 NOTE — Telephone Encounter (Signed)
Okay #90 x 0 

## 2012-11-23 NOTE — Telephone Encounter (Signed)
rx called into pharmacy

## 2012-12-03 ENCOUNTER — Ambulatory Visit (AMBULATORY_SURGERY_CENTER): Payer: Medicare Other | Admitting: Internal Medicine

## 2012-12-03 ENCOUNTER — Other Ambulatory Visit: Payer: Self-pay | Admitting: Internal Medicine

## 2012-12-03 ENCOUNTER — Encounter: Payer: Self-pay | Admitting: Internal Medicine

## 2012-12-03 VITALS — BP 130/73 | HR 88 | Temp 98.3°F | Resp 30 | Ht 71.0 in | Wt 226.0 lb

## 2012-12-03 DIAGNOSIS — Z1211 Encounter for screening for malignant neoplasm of colon: Secondary | ICD-10-CM

## 2012-12-03 DIAGNOSIS — K5730 Diverticulosis of large intestine without perforation or abscess without bleeding: Secondary | ICD-10-CM

## 2012-12-03 DIAGNOSIS — Z860100 Personal history of colon polyps, unspecified: Secondary | ICD-10-CM

## 2012-12-03 DIAGNOSIS — D126 Benign neoplasm of colon, unspecified: Secondary | ICD-10-CM

## 2012-12-03 DIAGNOSIS — K648 Other hemorrhoids: Secondary | ICD-10-CM

## 2012-12-03 HISTORY — DX: Personal history of colonic polyps: Z86.010

## 2012-12-03 HISTORY — DX: Personal history of colon polyps, unspecified: Z86.0100

## 2012-12-03 MED ORDER — SODIUM CHLORIDE 0.9 % IV SOLN: 500.0000 mL | INTRAVENOUS | Status: DC

## 2012-12-03 NOTE — Op Note (Signed)
[facility] [address] [place], [address]   COLONOSCOPY PROCEDURE REPORT  PATIENT: Randy Mcdaniel, Randy Mcdaniel  MR#: [id number] BIRTHDATE: 1941/01/23 , 72  yrs. old GENDER: Male ENDOSCOPIST: Iva Boop, MD, [facility] REFERRED [id number] Alphonsus Sias, M.D. PROCEDURE DATE:  12/03/2012 PROCEDURE:   Colonoscopy with snare polypectomy ASA CLASS:   Class III INDICATIONS:average risk screening. MEDICATIONS: propofol (Diprivan) 200mg  IV, MAC sedation, administered by CRNA, and These medications were titrated to patient response per physician's verbal order  DESCRIPTION OF PROCEDURE:   After the risks benefits and alternatives of the procedure were thoroughly explained, informed consent was obtained.  A digital rectal exam revealed internal hemorrhoids, A digital rectal exam revealed no rectal mass, A digital rectal exam revealed no prostatic nodules, and A digital rectal exam revealed the prostate was not enlarged.   The LB [id number]  endoscope was introduced through the anus and advanced to the cecum, which was identified by both the appendix and ileocecal valve. No adverse events experienced.   The quality of the prep was Suprep excellent  The instrument was then slowly withdrawn as the colon was fully examined.      COLON FINDINGS: Four polypoid shaped and smooth sessile polyps measuring 3-8 mm in size were found in the transverse colon.  A polypectomy was performed with a cold snare.  The resection was complete and the polyp tissue was completely retrieved.   There was mild scattered diverticulosis noted throughout the entire examined colon.   Large internal hemorrhoids were found.   The colon mucosa was otherwise normal.   A right colon retroflexion was performed. Retroflexed views revealed internal hemorrhoids. The time to cecum=3 minutes 0 seconds.  Withdrawal time=17 minutes 25 seconds. The scope was withdrawn and the procedure completed. COMPLICATIONS: There  were no complications.  ENDOSCOPIC IMPRESSION: 1.   Four sessile polyps measuring 3-8 mm in size were found in the transverse colon; polypectomy was performed with a cold snare 2.   There was mild diverticulosis noted throughout the entire examined colon 3.   Large internal hemorrhoids 4.   The colon mucosa was otherwise normal - excellent prep  RECOMMENDATIONS: 1.  Timing of repeat colonoscopy will be determined by pathology findings. 2.   I will contact him re: hemorrhoid treatment.   eSigned:  Iva Boop, MD, [facility] 12/03/2012 9:12 AM   cc: Karie Schwalbe, MD and The Patient   PATIENT NAME:  Randy Mcdaniel, Randy Mcdaniel MR#: [id number]

## 2012-12-03 NOTE — Progress Notes (Signed)
Called to room to assist during endoscopic procedure.  Patient ID and intended procedure confirmed with present staff. Received instructions for my participation in the procedure from the performing physician.  

## 2012-12-03 NOTE — Progress Notes (Signed)
Patient did not have preoperative order for IV antibiotic SSI prophylaxis. (G8918)  Patient did not experience any of the following events: a burn prior to discharge; a fall within the facility; wrong site/side/patient/procedure/implant event; or a hospital transfer or hospital admission upon discharge from the facility. (G8907)  

## 2012-12-03 NOTE — Patient Instructions (Addendum)
I found and removed 4 polyps. They all look benign. Other findings were mild diverticulosis and prolapsing hemorrhoids.  I think you could benefit from some treatment for the hemorrhoids and would like to contact you to discuss what is available.  I will let you know pathology results and when to have another routine colonoscopy by mail.  Thank you for choosing me and [facility].  Iva Boop, MD, FACG  YOU HAD AN ENDOSCOPIC PROCEDURE TODAY AT THE  ENDOSCOPY CENTER: Refer to the procedure report that was given to you for any specific questions about what was found during the examination.  If the procedure report does not answer your questions, please call your gastroenterologist to clarify.  If you requested that your care partner not be given the details of your procedure findings, then the procedure report has been included in a sealed envelope for you to review at your convenience later.  YOU SHOULD EXPECT: Some feelings of bloating in the abdomen. Passage of more gas than usual.  Walking can help get rid of the air that was put into your GI tract during the procedure and reduce the bloating. If you had a lower endoscopy (such as a colonoscopy or flexible sigmoidoscopy) you may notice spotting of blood in your stool or on the toilet paper. If you underwent a bowel prep for your procedure, then you may not have a normal bowel movement for a few days.  DIET: Your first meal following the procedure should be a light meal and then it is ok to progress to your normal diet.  A half-sandwich or bowl of soup is an example of a good first meal.  Heavy or fried foods are harder to digest and may make you feel nauseous or bloated.  Likewise meals heavy in dairy and vegetables can cause extra gas to form and this can also increase the bloating.  Drink plenty of fluids but you should avoid alcoholic beverages for 24 hours.  ACTIVITY: Your care partner should take you home directly  after the procedure.  You should plan to take it easy, moving slowly for the rest of the day.  You can resume normal activity the day after the procedure however you should NOT DRIVE or use heavy machinery for 24 hours (because of the sedation medicines used during the test).    SYMPTOMS TO REPORT IMMEDIATELY: A gastroenterologist can be reached at any hour.  During normal business hours, 8:30 AM to 5:00 PM Monday through Friday, call [phone].  After hours and on weekends, please call the GI answering service at [phone] who will take a message and have the physician on call contact you.   Following lower endoscopy (colonoscopy or flexible sigmoidoscopy):  Excessive amounts of blood in the stool  Significant tenderness or worsening of abdominal pains  Swelling of the abdomen that is new, acute  Fever of 100F or higher  FOLLOW UP: If any biopsies were taken you will be contacted by phone or by letter within the next 1-3 weeks.  Call your gastroenterologist if you have not heard about the biopsies in 3 weeks.  Our staff will call the home number listed on your records the next business day following your procedure to check on you and address any questions or concerns that you may have at that time regarding the information given to you following your procedure. This is a courtesy call and so if there is no answer at the home number and we have  not heard from you through the emergency physician on call, we will assume that you have returned to your regular daily activities without incident.  SIGNATURES/CONFIDENTIALITY: You and/or your care partner have signed paperwork which will be entered into your electronic medical record.  These signatures attest to the fact that that the information above on your After Visit Summary has been reviewed and is understood.  Full responsibility of the confidentiality of this discharge information lies with you and/or your care-partner.

## 2012-12-04 ENCOUNTER — Telehealth: Payer: Self-pay

## 2012-12-04 NOTE — Telephone Encounter (Signed)
Left a message on the pt's answering machine to call if any questions or concerns.  Maw

## 2012-12-06 ENCOUNTER — Encounter: Payer: Self-pay | Admitting: Internal Medicine

## 2012-12-06 NOTE — Progress Notes (Signed)
Quick Note:  4 adenomas max 8 mm Repeat colonoscopy about 11/2015 ______

## 2012-12-10 ENCOUNTER — Other Ambulatory Visit: Payer: Self-pay

## 2012-12-10 MED ORDER — METHYLPHENIDATE HCL 5 MG PO TABS: 5.0000 mg | ORAL_TABLET | Freq: Two times a day (BID) | ORAL | Status: DC

## 2012-12-10 NOTE — Telephone Encounter (Signed)
Spoke with patient and advised rx ready for pick-up and it will be at the front desk.  

## 2012-12-10 NOTE — Telephone Encounter (Signed)
Pt left v/m requesting rx Ritalin. Call when ready for pick up. 

## 2012-12-24 ENCOUNTER — Other Ambulatory Visit: Payer: Self-pay | Admitting: *Deleted

## 2012-12-24 MED ORDER — CLONAZEPAM 0.5 MG PO TABS: 0.5000 mg | ORAL_TABLET | Freq: Three times a day (TID) | ORAL | Status: DC | PRN

## 2012-12-24 NOTE — Telephone Encounter (Signed)
rx called into pharmacy

## 2012-12-24 NOTE — Telephone Encounter (Signed)
Okay #90 x 0 

## 2012-12-24 NOTE — Telephone Encounter (Signed)
Last filled 11/23/12 

## 2013-01-07 ENCOUNTER — Other Ambulatory Visit: Payer: Self-pay

## 2013-01-07 MED ORDER — METHYLPHENIDATE HCL 5 MG PO TABS: 5.0000 mg | ORAL_TABLET | Freq: Two times a day (BID) | ORAL | Status: DC

## 2013-01-07 NOTE — Telephone Encounter (Signed)
Spoke with patient and advised rx ready for pick-up and it will be at the front desk.  

## 2013-01-07 NOTE — Telephone Encounter (Signed)
Pt left v/m requesting rx Ritalin. Call when ready for pick up. 

## 2013-01-24 ENCOUNTER — Other Ambulatory Visit: Payer: Self-pay | Admitting: *Deleted

## 2013-01-24 MED ORDER — CLONAZEPAM 0.5 MG PO TABS: 0.5000 mg | ORAL_TABLET | Freq: Three times a day (TID) | ORAL | Status: DC | PRN

## 2013-01-24 NOTE — Telephone Encounter (Signed)
rx called into pharmacy

## 2013-01-24 NOTE — Telephone Encounter (Signed)
Okay #90 x 0 

## 2013-02-05 ENCOUNTER — Other Ambulatory Visit: Payer: Self-pay

## 2013-02-05 NOTE — Telephone Encounter (Signed)
Pt left v/m requesting rx ritalin. Call when ready for pick up. 

## 2013-02-06 MED ORDER — METHYLPHENIDATE HCL 5 MG PO TABS: 5.0000 mg | ORAL_TABLET | Freq: Two times a day (BID) | ORAL | Status: DC

## 2013-02-06 NOTE — Telephone Encounter (Signed)
Spoke with patient and advised rx ready for pick-up and it will be at the front desk.  

## 2013-02-07 ENCOUNTER — Encounter: Payer: Self-pay | Admitting: Internal Medicine

## 2013-02-21 ENCOUNTER — Encounter: Payer: Self-pay | Admitting: Internal Medicine

## 2013-02-25 ENCOUNTER — Other Ambulatory Visit: Payer: Self-pay | Admitting: Internal Medicine

## 2013-02-26 NOTE — Telephone Encounter (Signed)
Okay #90 x 0 

## 2013-02-26 NOTE — Telephone Encounter (Signed)
rx called into pharmacy

## 2013-03-11 ENCOUNTER — Other Ambulatory Visit: Payer: Self-pay

## 2013-03-11 MED ORDER — METHYLPHENIDATE HCL 5 MG PO TABS: 5.0000 mg | ORAL_TABLET | Freq: Two times a day (BID) | ORAL | Status: DC

## 2013-03-11 NOTE — Telephone Encounter (Signed)
Pt left v/m requesting ritalin rx. Call when ready for pick up.

## 2013-03-11 NOTE — Telephone Encounter (Signed)
Spoke with patient and advised rx ready for pick-up and it will be at the front desk.  

## 2013-03-19 ENCOUNTER — Other Ambulatory Visit: Payer: Self-pay | Admitting: Internal Medicine

## 2013-03-20 NOTE — Telephone Encounter (Signed)
Okay lexapro for a year  Clonazepam #90 x 0

## 2013-03-20 NOTE — Telephone Encounter (Signed)
rx sent to pharmacy by e-script rx called into pharmacy  

## 2013-04-15 ENCOUNTER — Encounter: Payer: Self-pay | Admitting: Internal Medicine

## 2013-04-15 ENCOUNTER — Ambulatory Visit (INDEPENDENT_AMBULATORY_CARE_PROVIDER_SITE_OTHER): Payer: Medicare Other | Admitting: Internal Medicine

## 2013-04-15 ENCOUNTER — Other Ambulatory Visit: Payer: Self-pay | Admitting: Internal Medicine

## 2013-04-15 VITALS — BP 120/70 | HR 103 | Temp 97.8°F | Wt 225.0 lb

## 2013-04-15 DIAGNOSIS — F329 Major depressive disorder, single episode, unspecified: Secondary | ICD-10-CM

## 2013-04-15 DIAGNOSIS — F411 Generalized anxiety disorder: Secondary | ICD-10-CM

## 2013-04-15 DIAGNOSIS — E1129 Type 2 diabetes mellitus with other diabetic kidney complication: Secondary | ICD-10-CM

## 2013-04-15 DIAGNOSIS — E785 Hyperlipidemia, unspecified: Secondary | ICD-10-CM

## 2013-04-15 MED ORDER — METHYLPHENIDATE HCL 5 MG PO TABS: 5.0000 mg | ORAL_TABLET | Freq: Two times a day (BID) | ORAL | Status: DC

## 2013-04-15 NOTE — Progress Notes (Signed)
Subjective:    Patient ID: Randy Mcdaniel, male    DOB: Dec 31, 1940, 72 y.o.   MRN: [id number]  HPI Some stress-- wife diagnosed with colitis Has responded to Rx  Good days and bad days No persistent depressed mood---may have 2-3 bad days and then pulls out Continues on the same regimen He isn't sure about whether the ritalin is helping  Checks sugars 2-3 times per week 80-140 fasting No hypoglycemic reactions  No chest pain No SOB Walks with cane---no set exercise  No problems with statin Does have chronic feet pain  Current Outpatient Prescriptions on File Prior to Visit  Medication Sig Dispense Refill  . atorvastatin (LIPITOR) 20 MG tablet Take 1 tablet (20 mg total) by mouth daily.  90 tablet  3  . clonazePAM (KLONOPIN) 0.5 MG tablet TAKE 1 TABLET BY MOUTH 3 TIMES A DAY AS NEEDED  90 tablet  0  . escitalopram (LEXAPRO) 20 MG tablet TAKE 2 TABLETS BY MOUTH EVERY MORNING  60 tablet  11  . glipiZIDE (GLUCOTROL) 5 MG tablet Take 1 tablet (5 mg total) by mouth 2 (two) times daily before a meal.  60 tablet  11  . glucose blood test strip Dx: 250.40  100 each  6  . levothyroxine (SYNTHROID, LEVOTHROID) 75 MCG tablet Take 1 tablet (75 mcg total) by mouth daily.  90 tablet  3  . metFORMIN (GLUCOPHAGE) 1000 MG tablet Take one tablet by mouth in AM, 1/2 tablet by mouth at lunch and one tablet by mouth at bedtime.  225 tablet  6  . naproxen sodium (ANAPROX) 220 MG tablet Take 220-440 mg by mouth at bedtime as needed.      Marland Kitchen OLANZapine (ZYPREXA) 10 MG tablet Take 1 tablet (10 mg total) by mouth at bedtime.  30 tablet  11  . vitamin B-12 (CYANOCOBALAMIN) 1000 MCG tablet Take 1,000 mcg by mouth daily.         No current facility-administered medications on file prior to visit.    Allergies  Allergen Reactions  . Ezetimibe-Simvastatin     REACTION: nausea    Past Medical History  Diagnosis Date  . Anxiety   . Depression     recurrent and severe  . Diabetes mellitus     Type  II with nephropathy  . HLD (hyperlipidemia)   . Osteoarthritis   . Hypothyroidism   . Personal history of colonic adenoma 12/03/2012    Past Surgical History  Procedure Laterality Date  . Umbilical hernia repair  2000  . 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 Remer Macho ([facility])  . Total knee arthroplasty  6/10    Left-Dr. Remer Macho ([facility])    Family History  Problem Relation Age of Onset  . Heart failure Father   . Diabetes Father   . Depression Father   . Coronary artery disease Father   . Breast cancer Sister   . Lung cancer Sister     ?   . Depression Sister   . Stroke      Paternal side  . Parkinsonism      Cousin  . Colon cancer Neg Hx   . Rectal cancer Neg Hx   . Esophageal cancer Neg Hx   . Stomach cancer Mother     History   Social History  . Marital Status: Married    Spouse Name: N/A    Number of Children: 2  . Years of Education: N/A   Occupational History  .  Retired    Social History Main Topics  . Smoking status: Never Smoker   . Smokeless tobacco: Former Neurosurgeon    Types: Chew  . Alcohol Use: No  . Drug Use: No  . Sexual Activity: Not on file   Other Topics Concern  . Not on file   Social History Narrative   Has living will.   No formal health care POA---requests wife to be   Would accept resuscitation attempts   Not sure about tube feeds   Review of Systems Appetite is fine Weight stable  Generally sleeps okay    Objective:   Physical Exam  Constitutional: He appears well-developed and well-nourished. No distress.  Neck: Normal range of motion. Neck supple. No thyromegaly present.  Cardiovascular: Normal rate, regular rhythm, normal heart sounds and intact distal pulses.  Exam reveals no gallop.   No murmur heard. Pulmonary/Chest: Effort normal and breath sounds normal. No respiratory distress. He has no wheezes. He has no rales.  Musculoskeletal: He exhibits no edema and no tenderness.  Skin:  No foot ulcers  Psychiatric: He has a  normal mood and affect. His behavior is normal.          Assessment & Plan:

## 2013-04-15 NOTE — Patient Instructions (Signed)
You can try going down to just 1 ritalin a day for a few weeks. If you are doing well, you can try without it. Please restart if your depression flares up at all.

## 2013-04-15 NOTE — Assessment & Plan Note (Signed)
Seems like he still has good control Will check A1c

## 2013-04-15 NOTE — Assessment & Plan Note (Signed)
Lab Results  Component Value Date   LDLCALC 72 11/29/2011   Last 101.6 Close to goal so no change

## 2013-04-15 NOTE — Assessment & Plan Note (Signed)
More stress with wife being ill Uses the clonazepam regularly and on lexapro

## 2013-04-15 NOTE — Assessment & Plan Note (Signed)
Mood is variable but generally okay Not sure he should try, but he can try to decrease ritalin (only if his wife thinks that is reasonable--don't want a relapse)

## 2013-04-22 ENCOUNTER — Other Ambulatory Visit: Payer: Self-pay | Admitting: Internal Medicine

## 2013-04-22 NOTE — Telephone Encounter (Signed)
Okay #90 x 0 

## 2013-04-22 NOTE — Telephone Encounter (Signed)
rx called into pharmacy

## 2013-05-20 ENCOUNTER — Other Ambulatory Visit: Payer: Self-pay | Admitting: Internal Medicine

## 2013-05-20 ENCOUNTER — Other Ambulatory Visit: Payer: Self-pay

## 2013-05-20 MED ORDER — METHYLPHENIDATE HCL 5 MG PO TABS: 5.0000 mg | ORAL_TABLET | Freq: Two times a day (BID) | ORAL | Status: DC

## 2013-05-20 NOTE — Telephone Encounter (Signed)
rx called into pharmacy

## 2013-05-20 NOTE — Telephone Encounter (Signed)
Okay #90 x 0 

## 2013-05-20 NOTE — Telephone Encounter (Signed)
Left message on machine that rx is ready for pick-up, and it will be at our front desk.  

## 2013-05-20 NOTE — Telephone Encounter (Signed)
Pt left v/m requesting rx Ritalin. Call when ready for pick up. 

## 2013-05-21 NOTE — Telephone Encounter (Signed)
Pt called to ck on status of Ritalin rx; advised pt at front desk for pickup. Pt's wife will pick up in AM.

## 2013-05-30 ENCOUNTER — Ambulatory Visit (INDEPENDENT_AMBULATORY_CARE_PROVIDER_SITE_OTHER): Payer: Medicare Other

## 2013-05-30 DIAGNOSIS — Z23 Encounter for immunization: Secondary | ICD-10-CM

## 2013-06-13 ENCOUNTER — Other Ambulatory Visit: Payer: Self-pay | Admitting: Internal Medicine

## 2013-06-24 ENCOUNTER — Other Ambulatory Visit: Payer: Self-pay | Admitting: Internal Medicine

## 2013-06-24 NOTE — Telephone Encounter (Signed)
Last filled 05/20/2013

## 2013-06-24 NOTE — Telephone Encounter (Signed)
rx called into pharmacy

## 2013-06-24 NOTE — Telephone Encounter (Signed)
Okay #90 x 0 

## 2013-07-01 ENCOUNTER — Other Ambulatory Visit: Payer: Self-pay

## 2013-07-01 MED ORDER — METHYLPHENIDATE HCL 5 MG PO TABS: 5.0000 mg | ORAL_TABLET | Freq: Two times a day (BID) | ORAL | Status: DC

## 2013-07-01 NOTE — Telephone Encounter (Signed)
Pt left v/m requesting rx ritalin. Call when ready for pick up. 

## 2013-07-01 NOTE — Telephone Encounter (Signed)
Spoke with patient and advised rx ready for pick-up and it will be at the front desk.  

## 2013-07-22 ENCOUNTER — Other Ambulatory Visit: Payer: Self-pay | Admitting: Internal Medicine

## 2013-07-22 NOTE — Telephone Encounter (Signed)
rx called into pharmacy

## 2013-07-22 NOTE — Telephone Encounter (Signed)
Last filled 06/24/13

## 2013-07-22 NOTE — Telephone Encounter (Signed)
Okay #90 x 0 

## 2013-07-29 ENCOUNTER — Other Ambulatory Visit: Payer: Self-pay

## 2013-07-29 MED ORDER — METHYLPHENIDATE HCL 5 MG PO TABS: 5.0000 mg | ORAL_TABLET | Freq: Two times a day (BID) | ORAL | Status: DC

## 2013-07-29 NOTE — Telephone Encounter (Signed)
Spoke with patient and advised rx ready for pick-up and it will be at the front desk.  

## 2013-07-29 NOTE — Telephone Encounter (Signed)
Pt left v/m requesting rx Ritalin. Call when ready for pick up. 

## 2013-08-16 ENCOUNTER — Encounter: Payer: Self-pay | Admitting: Internal Medicine

## 2013-08-19 ENCOUNTER — Other Ambulatory Visit: Payer: Self-pay | Admitting: Internal Medicine

## 2013-08-19 NOTE — Telephone Encounter (Signed)
rx sent to pharmacy by e-script rx called into pharmacy  

## 2013-08-19 NOTE — Telephone Encounter (Signed)
Last refilled 07/22/13

## 2013-08-19 NOTE — Telephone Encounter (Signed)
Okay to fill olanzapine for a year  Clonazepam #90 x 0

## 2013-08-30 ENCOUNTER — Other Ambulatory Visit: Payer: Self-pay

## 2013-08-30 NOTE — Telephone Encounter (Signed)
Pt request rx ritalin. Call when ready for pick up; pt said has enough med to last until 09/02/13. Pt request to pick up rx on 09/02/13.

## 2013-09-02 MED ORDER — METHYLPHENIDATE HCL 5 MG PO TABS: 5.0000 mg | ORAL_TABLET | Freq: Two times a day (BID) | ORAL | Status: DC

## 2013-09-02 NOTE — Telephone Encounter (Signed)
Pt left v/m requesting to pick up Ritalin rx today; pt is out of med.

## 2013-09-02 NOTE — Telephone Encounter (Signed)
Spoke with patient and advised rx ready for pick-up and it will be at the front desk.  

## 2013-09-05 ENCOUNTER — Telehealth: Payer: Self-pay | Admitting: *Deleted

## 2013-09-05 NOTE — Telephone Encounter (Signed)
Form done 

## 2013-09-05 NOTE — Telephone Encounter (Signed)
Received prior auth request for metformin  as of Jan 1, insurance will only cover a max of 2 tab daily, without a prior auth/ quanitity limit override.  Auth paperwork obtained and placed in your inbox.

## 2013-09-09 NOTE — Telephone Encounter (Signed)
Approval form received by insurance company. Will send to provider for signature, then to be scanned into patients medical record. Pharmacy notified via fax.  

## 2013-09-23 ENCOUNTER — Other Ambulatory Visit: Payer: Self-pay | Admitting: Internal Medicine

## 2013-09-23 NOTE — Telephone Encounter (Signed)
rx called into pharmacy

## 2013-09-23 NOTE — Telephone Encounter (Signed)
08/19/13 

## 2013-09-23 NOTE — Telephone Encounter (Signed)
Okay #90 x 0 

## 2013-09-24 ENCOUNTER — Other Ambulatory Visit: Payer: Self-pay | Admitting: Family Medicine

## 2013-09-24 NOTE — Telephone Encounter (Signed)
Last filled 08/30/13

## 2013-09-25 MED ORDER — METHYLPHENIDATE HCL 5 MG PO TABS: 5.0000 mg | ORAL_TABLET | Freq: Two times a day (BID) | ORAL | Status: DC

## 2013-09-25 NOTE — Telephone Encounter (Signed)
Spoke with patient and advised rx ready for pick-up and it will be at the front desk.  

## 2013-10-21 ENCOUNTER — Other Ambulatory Visit: Payer: Self-pay | Admitting: Internal Medicine

## 2013-10-21 NOTE — Telephone Encounter (Signed)
Okay #90 x 0 

## 2013-10-21 NOTE — Telephone Encounter (Signed)
09/23/13 

## 2013-10-21 NOTE — Telephone Encounter (Signed)
rx called into pharmacy

## 2013-10-22 ENCOUNTER — Encounter: Payer: Medicare Other | Admitting: Internal Medicine

## 2013-10-28 ENCOUNTER — Other Ambulatory Visit: Payer: Self-pay

## 2013-10-28 MED ORDER — METHYLPHENIDATE HCL 5 MG PO TABS
5.0000 mg | ORAL_TABLET | Freq: Two times a day (BID) | ORAL | Status: DC
Start: 2013-10-28 — End: 2013-11-25

## 2013-10-28 NOTE — Telephone Encounter (Signed)
Pt request rx ritalin. Call when ready for pick up.

## 2013-10-28 NOTE — Telephone Encounter (Signed)
Spoke with patient and advised rx ready for pick-up and it will be at the front desk.  

## 2013-11-14 ENCOUNTER — Encounter: Payer: Self-pay | Admitting: Radiology

## 2013-11-15 ENCOUNTER — Ambulatory Visit (INDEPENDENT_AMBULATORY_CARE_PROVIDER_SITE_OTHER): Payer: Medicare Other | Admitting: Internal Medicine

## 2013-11-15 ENCOUNTER — Encounter: Payer: Self-pay | Admitting: Internal Medicine

## 2013-11-15 VITALS — BP 120/70 | HR 106 | Temp 97.8°F | Ht 71.0 in | Wt 222.0 lb

## 2013-11-15 DIAGNOSIS — Z0000 Encounter for general adult medical examination without abnormal findings: Secondary | ICD-10-CM | POA: Diagnosis not present

## 2013-11-15 DIAGNOSIS — E538 Deficiency of other specified B group vitamins: Secondary | ICD-10-CM | POA: Diagnosis not present

## 2013-11-15 DIAGNOSIS — Z23 Encounter for immunization: Secondary | ICD-10-CM | POA: Diagnosis not present

## 2013-11-15 DIAGNOSIS — E1149 Type 2 diabetes mellitus with other diabetic neurological complication: Secondary | ICD-10-CM

## 2013-11-15 DIAGNOSIS — F325 Major depressive disorder, single episode, in full remission: Secondary | ICD-10-CM

## 2013-11-15 DIAGNOSIS — IMO0002 Reserved for concepts with insufficient information to code with codable children: Secondary | ICD-10-CM | POA: Diagnosis not present

## 2013-11-15 DIAGNOSIS — L909 Atrophic disorder of skin, unspecified: Secondary | ICD-10-CM

## 2013-11-15 DIAGNOSIS — L919 Hypertrophic disorder of the skin, unspecified: Secondary | ICD-10-CM

## 2013-11-15 DIAGNOSIS — L918 Other hypertrophic disorders of the skin: Secondary | ICD-10-CM

## 2013-11-15 DIAGNOSIS — E1142 Type 2 diabetes mellitus with diabetic polyneuropathy: Secondary | ICD-10-CM

## 2013-11-15 DIAGNOSIS — E039 Hypothyroidism, unspecified: Secondary | ICD-10-CM

## 2013-11-15 LAB — LIPID PANEL
Cholesterol: 160 mg/dL (ref 0–200)
HDL: 42 mg/dL (ref >39)
LDL CALC: 74 mg/dL (ref 0–99)
Total CHOL/HDL Ratio: 3.8 Ratio
Triglycerides: 220 mg/dL — ABNORMAL HIGH (ref <150)
VLDL: 44 mg/dL — AB (ref 0–40)

## 2013-11-15 LAB — CBC WITH DIFFERENTIAL/PLATELET
Basophils Absolute: 0.1 10*3/uL (ref 0.0–0.1)
Basophils Relative: 1 % (ref 0–1)
EOS ABS: 0.3 10*3/uL (ref 0.0–0.7)
EOS PCT: 4 % (ref 0–5)
HEMATOCRIT: 40.2 % (ref 39.0–52.0)
Hemoglobin: 13.6 g/dL (ref 13.0–17.0)
LYMPHS PCT: 30 % (ref 12–46)
Lymphs Abs: 2.6 10*3/uL (ref 0.7–4.0)
MCH: 29.3 pg (ref 26.0–34.0)
MCHC: 33.8 g/dL (ref 30.0–36.0)
MCV: 86.6 fL (ref 78.0–100.0)
MONO ABS: 0.6 10*3/uL (ref 0.1–1.0)
Monocytes Relative: 7 % (ref 3–12)
Neutro Abs: 4.9 10*3/uL (ref 1.7–7.7)
Neutrophils Relative %: 58 % (ref 43–77)
Platelets: 282 10*3/uL (ref 150–400)
RBC: 4.64 MIL/uL (ref 4.22–5.81)
RDW: 14.6 % (ref 11.5–15.5)
WBC: 8.5 10*3/uL (ref 4.0–10.5)

## 2013-11-15 LAB — COMPREHENSIVE METABOLIC PANEL
ALK PHOS: 54 U/L (ref 39–117)
ALT: 15 U/L (ref 0–53)
AST: 23 U/L (ref 0–37)
Albumin: 4.3 g/dL (ref 3.5–5.2)
BUN: 13 mg/dL (ref 6–23)
CO2: 28 meq/L (ref 19–32)
CREATININE: 1.09 mg/dL (ref 0.50–1.35)
Calcium: 10.2 mg/dL (ref 8.4–10.5)
Chloride: 101 mEq/L (ref 96–112)
Glucose, Bld: 96 mg/dL (ref 70–99)
Potassium: 4.6 mEq/L (ref 3.5–5.3)
Sodium: 139 mEq/L (ref 135–145)
Total Bilirubin: 0.4 mg/dL (ref 0.2–1.2)
Total Protein: 7.1 g/dL (ref 6.0–8.3)

## 2013-11-15 LAB — HM DIABETES FOOT EXAM

## 2013-11-15 NOTE — Assessment & Plan Note (Signed)
Has ~101mm pedunculated skin tag under medial left eye Really bothering him Asks for removal  Liquid nitrogen 40 seconds x 2  tolerated well Discussed home care

## 2013-11-15 NOTE — Addendum Note (Signed)
Addended by: Despina Hidden on: 11/15/2013 03:41 PM   Modules accepted: Orders

## 2013-11-15 NOTE — Assessment & Plan Note (Signed)
Due for labs

## 2013-11-15 NOTE — Assessment & Plan Note (Signed)
Will give prevnar Got zostavax last year No cancer screening now

## 2013-11-15 NOTE — Assessment & Plan Note (Signed)
Hopefully still acceptable control Needs adjustment for A1c over 9%

## 2013-11-15 NOTE — Assessment & Plan Note (Signed)
No major pain No meds for this

## 2013-11-15 NOTE — Progress Notes (Signed)
Subjective:    Patient ID: Randy Mcdaniel, male    DOB: June 27, 1941, 73 y.o.   MRN: [id number]  HPI Here for physical With wife Reviewed his wellness form Taking a proprietary OTC for memory--he thinks it is helping. No clear apraxia Reviewed advanced directives Not much exercise  Checks sugars once a week or so Usually 80-145 random No hypoglycemic reactions---occasionally woozy and will eat something No foot pain and tingling. Tingling in hands also (and some numbness)  Mood has been good Wife agrees  Current Outpatient Prescriptions on File Prior to Visit  Medication Sig Dispense Refill  . atorvastatin (LIPITOR) 20 MG tablet TAKE 1 TABLET BY MOUTH ONCE A DAY  90 tablet  3  . clonazePAM (KLONOPIN) 0.5 MG tablet TAKE 1 TABLET BY MOUTH 3 TIMES A DAY AS NEEDED  90 tablet  0  . escitalopram (LEXAPRO) 20 MG tablet TAKE 2 TABLETS BY MOUTH EVERY MORNING  60 tablet  11  . glipiZIDE (GLUCOTROL) 5 MG tablet TAKE 1 TABLET BY MOUTH TWICE A DAY BEFORE A MEAL  60 tablet  11  . glucose blood test strip Dx: 250.40  100 each  6  . levothyroxine (SYNTHROID, LEVOTHROID) 75 MCG tablet TAKE 1 TABLET BY MOUTH ONCE A DAY  90 tablet  3  . lisinopril (PRINIVIL,ZESTRIL) 10 MG tablet TAKE 1 TABLET BY MOUTH ONCE A DAY  90 tablet  3  . metFORMIN (GLUCOPHAGE) 1000 MG tablet Take one tablet by mouth in AM, 1/2 tablet by mouth at lunch and one tablet by mouth at bedtime.  225 tablet  6  . methylphenidate (RITALIN) 5 MG tablet Take 1 tablet (5 mg total) by mouth 2 (two) times daily.  60 tablet  0  . naproxen sodium (ANAPROX) 220 MG tablet Take 220-440 mg by mouth at bedtime as needed.      Marland Kitchen OLANZapine (ZYPREXA) 10 MG tablet TAKE 1 TABLET BY MOUTH DAILY AT BEDTIME  30 tablet  11  . vitamin B-12 (CYANOCOBALAMIN) 1000 MCG tablet Take 1,000 mcg by mouth daily.         No current facility-administered medications on file prior to visit.    Allergies  Allergen Reactions  . Ezetimibe-Simvastatin    REACTION: nausea    Past Medical History  Diagnosis Date  . Anxiety   . Depression     recurrent and severe  . Diabetes mellitus     Type II with nephropathy  . HLD (hyperlipidemia)   . Osteoarthritis   . Hypothyroidism   . Personal history of colonic adenoma 12/03/2012    Past Surgical History  Procedure Laterality Date  . Umbilical hernia repair  2000  . 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 Clair Gulling ([facility])  . Total knee arthroplasty  6/10    Left-Dr. Clair Gulling ([facility])    Family History  Problem Relation Age of Onset  . Heart failure Father   . Diabetes Father   . Depression Father   . Coronary artery disease Father   . Breast cancer Sister   . Lung cancer Sister     ?   . Depression Sister   . Stroke      Paternal side  . Parkinsonism      Cousin  . Colon cancer Neg Hx   . Rectal cancer Neg Hx   . Esophageal cancer Neg Hx   . Stomach cancer Mother     History   Social History  . Marital Status:  Married    Spouse Name: N/A    Number of Children: 2  . Years of Education: N/A   Occupational History  . Retired    Social History Main Topics  . Smoking status: Never Smoker   . Smokeless tobacco: Former Systems developer    Types: Chew  . Alcohol Use: No  . Drug Use: No  . Sexual Activity: Not on file   Other Topics Concern  . Not on file   Social History Narrative   Has living will.   No formal health care POA---requests wife to be   Would accept resuscitation attempts   No tube feeds if cognitively unaware   Review of Systems  Constitutional: Negative for fatigue and unexpected weight change.       Wears seat belt  HENT: Negative for dental problem, hearing loss and tinnitus.        A few bottom teeth, upper denture  Eyes: Negative for visual disturbance.       Regular with eye exams--no retinopathy  Respiratory: Positive for cough. Negative for chest tightness and shortness of breath.        Cough during snow storm  Cardiovascular: Negative for chest  pain, palpitations and leg swelling.  Gastrointestinal: Positive for diarrhea. Negative for nausea, vomiting, constipation and blood in stool.  Endocrine: Negative for cold intolerance and heat intolerance.  Genitourinary: Positive for frequency. Negative for urgency and difficulty urinating.  Musculoskeletal: Positive for arthralgias and back pain. Negative for joint swelling.       Uses aleve prn  Skin: Negative for rash.       No suspicious lesions Red streak on sides of nose and mouth (told him seborrhea)  Allergic/Immunologic: Positive for environmental allergies. Negative for immunocompromised state.       Uses some OTC meds at times  Neurological: Positive for numbness. Negative for dizziness, syncope, weakness, light-headedness and headaches.  Hematological: Negative for adenopathy. Does not bruise/bleed easily.  Psychiatric/Behavioral: Negative for sleep disturbance. The patient is not nervous/anxious.        Nerves controlled with clonazepam       Objective:   Physical Exam  Constitutional: He is oriented to person, place, and time. He appears well-developed and well-nourished. No distress.  HENT:  Head: Normocephalic and atraumatic.  Right Ear: External ear normal.  Left Ear: External ear normal.  Mouth/Throat: Oropharynx is clear and moist.  Eyes: Conjunctivae and EOM are normal. Pupils are equal, round, and reactive to light.  Neck: Normal range of motion. Neck supple. No thyromegaly present.  Cardiovascular: Normal rate, regular rhythm, normal heart sounds and intact distal pulses.  Exam reveals no gallop.   No murmur heard. Pulmonary/Chest: Effort normal and breath sounds normal. No respiratory distress. He has no wheezes. He has no rales.  Abdominal: Soft. There is no tenderness.  Musculoskeletal: He exhibits no edema and no tenderness.  Lymphadenopathy:    He has no cervical adenopathy.  Neurological: He is alert and oriented to person, place, and time.    President-- "[name], [place]" [phone] [id number] Recall 1/3  Decreased fine touch sensation in plantar feet  Skin: No rash noted. No erythema.  No foot lesions  Psychiatric: He has a normal mood and affect. His behavior is normal.          Assessment & Plan:

## 2013-11-15 NOTE — Progress Notes (Signed)
Pre visit review using our clinic review tool, if applicable. No additional management support is needed unless otherwise documented below in the visit note. 

## 2013-11-15 NOTE — Assessment & Plan Note (Signed)
Will continue all the medications Doing well

## 2013-11-16 LAB — VITAMIN B12: Vitamin B-12: 1753 pg/mL — ABNORMAL HIGH (ref 211–911)

## 2013-11-16 LAB — HEMOGLOBIN A1C
HEMOGLOBIN A1C: 7 % — AB (ref <5.7)
Mean Plasma Glucose: 154 mg/dL — ABNORMAL HIGH (ref <117)

## 2013-11-16 LAB — TSH: TSH: 5.37 u[IU]/mL — ABNORMAL HIGH (ref 0.350–4.500)

## 2013-11-16 LAB — T4, FREE: Free T4: 1.02 ng/dL (ref 0.80–1.80)

## 2013-11-18 ENCOUNTER — Other Ambulatory Visit: Payer: Self-pay | Admitting: Internal Medicine

## 2013-11-18 NOTE — Telephone Encounter (Signed)
Pre visit review using our clinic review tool, if applicable. No additional management support is needed unless otherwise documented below in the visit note. 

## 2013-11-18 NOTE — Telephone Encounter (Signed)
10/21/2013 

## 2013-11-18 NOTE — Telephone Encounter (Signed)
Okay #90 x 0 

## 2013-11-25 ENCOUNTER — Other Ambulatory Visit: Payer: Self-pay

## 2013-11-25 MED ORDER — METHYLPHENIDATE HCL 5 MG PO TABS: 5.0000 mg | ORAL_TABLET | Freq: Two times a day (BID) | ORAL | Status: DC

## 2013-11-25 NOTE — Telephone Encounter (Signed)
Pt request rx Ritalin. Call when ready for pick up. 

## 2013-11-26 ENCOUNTER — Telehealth: Payer: Self-pay

## 2013-11-26 NOTE — Telephone Encounter (Signed)
Spoke with patient and advised rx ready for pick-up and it will be at the front desk.  

## 2013-11-26 NOTE — Telephone Encounter (Signed)
Relevant patient education assigned to patient using Emmi. ° °

## 2013-12-16 ENCOUNTER — Other Ambulatory Visit: Payer: Self-pay | Admitting: Internal Medicine

## 2013-12-16 NOTE — Telephone Encounter (Signed)
rx called into pharmacy

## 2013-12-16 NOTE — Telephone Encounter (Signed)
11/18/13 

## 2013-12-16 NOTE — Telephone Encounter (Signed)
Okay #90 x 0 

## 2013-12-23 ENCOUNTER — Other Ambulatory Visit: Payer: Self-pay

## 2013-12-23 ENCOUNTER — Telehealth: Payer: Self-pay

## 2013-12-23 MED ORDER — METHYLPHENIDATE HCL 5 MG PO TABS: 5.0000 mg | ORAL_TABLET | Freq: Two times a day (BID) | ORAL | Status: DC

## 2013-12-23 NOTE — Telephone Encounter (Signed)
Pt left v/m requesting rx ritalin. Call when ready for pick up. 

## 2013-12-23 NOTE — Telephone Encounter (Signed)
Pt left v/m requesting glucose meter and diabetic testing strips sent to [place]. Pts checks blood sugar once daily. Pt will ck with his insurance to see which glucose meter is covered and pt will cb.

## 2013-12-23 NOTE — Telephone Encounter (Signed)
Spoke with patient and advised rx ready for pick-up and it will be at the front desk.  

## 2013-12-23 NOTE — Telephone Encounter (Signed)
Okay to send Rx Pharmacist may be able to check on what is covered

## 2014-01-13 ENCOUNTER — Other Ambulatory Visit: Payer: Self-pay | Admitting: Internal Medicine

## 2014-01-21 ENCOUNTER — Other Ambulatory Visit: Payer: Self-pay | Admitting: Internal Medicine

## 2014-01-21 MED ORDER — METHYLPHENIDATE HCL 5 MG PO TABS: 5.0000 mg | ORAL_TABLET | Freq: Two times a day (BID) | ORAL | Status: DC

## 2014-01-21 NOTE — Telephone Encounter (Signed)
Pt left v/m requesting rx for ritalin. Call when ready for pick up.

## 2014-01-21 NOTE — Telephone Encounter (Signed)
Okay to call in the clonazepam

## 2014-01-21 NOTE — Telephone Encounter (Signed)
12/16/13 

## 2014-01-21 NOTE — Telephone Encounter (Signed)
rx called into pharmacy Spoke with patient and advised rx ready for pick-up and it will be at the front desk.  

## 2014-01-23 ENCOUNTER — Encounter: Payer: Self-pay | Admitting: Internal Medicine

## 2014-01-23 ENCOUNTER — Ambulatory Visit (INDEPENDENT_AMBULATORY_CARE_PROVIDER_SITE_OTHER): Payer: Medicare Other | Admitting: Internal Medicine

## 2014-01-23 VITALS — BP 110/70 | HR 102 | Temp 98.0°F | Wt 221.0 lb

## 2014-01-23 DIAGNOSIS — R269 Unspecified abnormalities of gait and mobility: Secondary | ICD-10-CM | POA: Insufficient documentation

## 2014-01-23 NOTE — Progress Notes (Signed)
Pre visit review using our clinic review tool, if applicable. No additional management support is needed unless otherwise documented below in the visit note. 

## 2014-01-23 NOTE — Assessment & Plan Note (Signed)
With the sensory changes but no weakness, I suspect spinal stenosis He was afraid of Parkinsons--but I don't think he has this Normal B12 levels and recent labs  Will just get him set up with neurology eval

## 2014-01-23 NOTE — Progress Notes (Signed)
Subjective:    Patient ID: Randy Mcdaniel, male    DOB: 1941-01-18, 73 y.o.   MRN: [id number]  HPI Here with wife Has been wobbling--can't stand for long Low back down to feet go numb Has fallen once--though not recently Can't stand up long enough to shave  Hands are tingling and feet go numb This goes back a while but has worsened Has been walking in a circle around the inside of his house---has to stop at 6 times due to numbness  Has tightness in right hamstring Doesn't think his legs are numb  Current Outpatient Prescriptions on File Prior to Visit  Medication Sig Dispense Refill  . atorvastatin (LIPITOR) 20 MG tablet TAKE 1 TABLET BY MOUTH ONCE A DAY  90 tablet  3  . clonazePAM (KLONOPIN) 0.5 MG tablet TAKE 1 TABLET BY MOUTH 3 TIMES A DAY AS NEEDED  90 tablet  0  . escitalopram (LEXAPRO) 20 MG tablet TAKE 2 TABLETS BY MOUTH EVERY MORNING  60 tablet  11  . glipiZIDE (GLUCOTROL) 5 MG tablet TAKE 1 TABLET BY MOUTH TWICE A DAY BEFORE A MEAL  60 tablet  11  . glucose blood test strip Dx: 250.40  100 each  6  . levothyroxine (SYNTHROID, LEVOTHROID) 75 MCG tablet TAKE 1 TABLET BY MOUTH ONCE A DAY  90 tablet  3  . lisinopril (PRINIVIL,ZESTRIL) 10 MG tablet TAKE 1 TABLET BY MOUTH ONCE A DAY  90 tablet  3  . metFORMIN (GLUCOPHAGE) 1000 MG tablet TAKE 1 TABLET BY MOUTH ONCE IN THE MORNING, THEN TAKE 1/2 TABLET BY MOUTH AT LUNCH, THEN TAKE 1 TABLET AT BEDTIME  225 tablet  0  . methylphenidate (RITALIN) 5 MG tablet Take 1 tablet (5 mg total) by mouth 2 (two) times daily.  60 tablet  0  . naproxen sodium (ANAPROX) 220 MG tablet Take 220-440 mg by mouth at bedtime as needed.      Marland Kitchen OLANZapine (ZYPREXA) 10 MG tablet TAKE 1 TABLET BY MOUTH DAILY AT BEDTIME  30 tablet  11  . vitamin B-12 (CYANOCOBALAMIN) 1000 MCG tablet Take 1,000 mcg by mouth daily.         No current facility-administered medications on file prior to visit.    Allergies  Allergen Reactions  . Ezetimibe-Simvastatin     REACTION: nausea    Past Medical History  Diagnosis Date  . Anxiety   . Depression     recurrent and severe  . Diabetes mellitus     Type II with nephropathy  . HLD (hyperlipidemia)   . Osteoarthritis   . Hypothyroidism   . Personal history of colonic adenoma 12/03/2012    Past Surgical History  Procedure Laterality Date  . Umbilical hernia repair  2000  . 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 Clair Gulling ([facility])  . Total knee arthroplasty  6/10    Left-Dr. Clair Gulling ([facility])    Family History  Problem Relation Age of Onset  . Heart failure Father   . Diabetes Father   . Depression Father   . Coronary artery disease Father   . Breast cancer Sister   . Lung cancer Sister     ?   . Depression Sister   . Stroke      Paternal side  . Parkinsonism      Cousin  . Colon cancer Neg Hx   . Rectal cancer Neg Hx   . Esophageal cancer Neg Hx   . Stomach cancer  Mother     History   Social History  . Marital Status: Married    Spouse Name: N/A    Number of Children: 2  . Years of Education: N/A   Occupational History  . Retired    Social History Main Topics  . Smoking status: Never Smoker   . Smokeless tobacco: Former Systems developer    Types: Chew  . Alcohol Use: No  . Drug Use: No  . Sexual Activity: Not on file   Other Topics Concern  . Not on file   Social History Narrative   Has living will.   No formal health care POA---requests wife to be   Would accept resuscitation attempts   No tube feeds if cognitively unaware   Review of Systems No urinary incontinence Mild memory problems-- hasn't worsened. Stopped the OTC medication--didn't seem to help and it made him eat funny Appetite remains excellent    Objective:   Physical Exam  Constitutional: He appears well-developed and well-nourished. No distress.  Musculoskeletal:  Gait is hesitant but not clearly ataxic  Neurological:  Clear decreased sensation loss on right plantar feet, not left though No focal  weakness No tremor No clear bradykinesia          Assessment & Plan:

## 2014-02-18 ENCOUNTER — Other Ambulatory Visit: Payer: Self-pay | Admitting: Internal Medicine

## 2014-02-18 ENCOUNTER — Other Ambulatory Visit: Payer: Self-pay

## 2014-02-18 MED ORDER — METHYLPHENIDATE HCL 5 MG PO TABS: 5.0000 mg | ORAL_TABLET | Freq: Two times a day (BID) | ORAL | Status: DC

## 2014-02-18 NOTE — Telephone Encounter (Signed)
rx called into pharmacy

## 2014-02-18 NOTE — Telephone Encounter (Signed)
Okay #90 x 0 

## 2014-02-18 NOTE — Telephone Encounter (Signed)
Pt left v/m requesting rx ritalin. Call when ready for pick up. 

## 2014-02-18 NOTE — Telephone Encounter (Signed)
01/21/14 

## 2014-02-18 NOTE — Telephone Encounter (Signed)
Spoke with patient and advised rx ready for pick-up and it will be at the front desk.  

## 2014-02-20 ENCOUNTER — Ambulatory Visit: Payer: Medicare Other | Admitting: Neurology

## 2014-03-24 ENCOUNTER — Other Ambulatory Visit: Payer: Self-pay

## 2014-03-24 ENCOUNTER — Other Ambulatory Visit: Payer: Self-pay | Admitting: Internal Medicine

## 2014-03-24 MED ORDER — METHYLPHENIDATE HCL 5 MG PO TABS: 5.0000 mg | ORAL_TABLET | Freq: Two times a day (BID) | ORAL | Status: DC

## 2014-03-24 NOTE — Telephone Encounter (Signed)
Pt left v/m requesting rx ritalin. Call when ready for pick up. 

## 2014-03-24 NOTE — Telephone Encounter (Signed)
Okay clonazepam #90 x 0  escitalopram for a year

## 2014-03-24 NOTE — Telephone Encounter (Signed)
Spoke with patient and advised rx ready for pick-up and it will be at the front desk.  

## 2014-03-24 NOTE — Telephone Encounter (Signed)
rx sent to pharmacy by e-script rx called into pharmacy  

## 2014-03-24 NOTE — Telephone Encounter (Signed)
02/18/14 

## 2014-04-11 ENCOUNTER — Telehealth: Payer: Self-pay

## 2014-04-11 NOTE — Telephone Encounter (Signed)
Tammy with [employer] left v/m with pt on line as well left v/m requesting last BP and A1C results. Pt said OK to give info. Left v/m for tammy last BP was 01/23/14 BP 110/70 and last A1C 11/15/13 result was 7.0.

## 2014-04-14 ENCOUNTER — Other Ambulatory Visit: Payer: Self-pay | Admitting: Internal Medicine

## 2014-04-21 ENCOUNTER — Other Ambulatory Visit: Payer: Self-pay | Admitting: Internal Medicine

## 2014-04-21 ENCOUNTER — Other Ambulatory Visit: Payer: Self-pay

## 2014-04-21 MED ORDER — METHYLPHENIDATE HCL 5 MG PO TABS: 5.0000 mg | ORAL_TABLET | Freq: Two times a day (BID) | ORAL | Status: DC

## 2014-04-21 NOTE — Telephone Encounter (Signed)
Pt request rx for Ritalin. Call when ready for pick up.

## 2014-04-21 NOTE — Telephone Encounter (Signed)
Okay #90 x 0 

## 2014-04-21 NOTE — Telephone Encounter (Signed)
rx called into pharmacy

## 2014-04-21 NOTE — Telephone Encounter (Signed)
03/24/14 

## 2014-04-21 NOTE — Telephone Encounter (Signed)
Left message on machine that rx is ready for pick-up, and it will be at our front desk.  

## 2014-05-14 ENCOUNTER — Other Ambulatory Visit: Payer: Self-pay | Admitting: *Deleted

## 2014-05-14 MED ORDER — ATORVASTATIN CALCIUM 20 MG PO TABS
20.0000 mg | ORAL_TABLET | Freq: Every day | ORAL | Status: DC
Start: 2014-05-14 — End: 2015-05-18

## 2014-05-19 ENCOUNTER — Other Ambulatory Visit: Payer: Self-pay | Admitting: Internal Medicine

## 2014-05-19 NOTE — Telephone Encounter (Signed)
04/21/14 

## 2014-05-20 ENCOUNTER — Ambulatory Visit (INDEPENDENT_AMBULATORY_CARE_PROVIDER_SITE_OTHER): Payer: Medicare Other | Admitting: Internal Medicine

## 2014-05-20 ENCOUNTER — Encounter: Payer: Self-pay | Admitting: Internal Medicine

## 2014-05-20 VITALS — BP 140/90 | HR 99 | Temp 98.3°F | Wt 221.0 lb

## 2014-05-20 DIAGNOSIS — R269 Unspecified abnormalities of gait and mobility: Secondary | ICD-10-CM

## 2014-05-20 DIAGNOSIS — F325 Major depressive disorder, single episode, in full remission: Secondary | ICD-10-CM

## 2014-05-20 DIAGNOSIS — F410 Panic disorder [episodic paroxysmal anxiety] without agoraphobia: Secondary | ICD-10-CM

## 2014-05-20 DIAGNOSIS — Z23 Encounter for immunization: Secondary | ICD-10-CM

## 2014-05-20 DIAGNOSIS — E1149 Type 2 diabetes mellitus with other diabetic neurological complication: Secondary | ICD-10-CM

## 2014-05-20 DIAGNOSIS — IMO0002 Reserved for concepts with insufficient information to code with codable children: Secondary | ICD-10-CM

## 2014-05-20 DIAGNOSIS — E1142 Type 2 diabetes mellitus with diabetic polyneuropathy: Secondary | ICD-10-CM

## 2014-05-20 MED ORDER — METHYLPHENIDATE HCL 5 MG PO TABS: 5.0000 mg | ORAL_TABLET | Freq: Two times a day (BID) | ORAL | Status: DC

## 2014-05-20 MED ORDER — ALPRAZOLAM 0.5 MG PO TABS: 0.5000 mg | ORAL_TABLET | Freq: Three times a day (TID) | ORAL | Status: DC | PRN

## 2014-05-20 NOTE — Addendum Note (Signed)
Addended by: Despina Hidden on: 05/20/2014 08:36 AM   Modules accepted: Orders

## 2014-05-20 NOTE — Assessment & Plan Note (Signed)
Due to arthritis, balance issues with neuropathy, probably strength issues Better with the tylenol Will hold off on neurologist

## 2014-05-20 NOTE — Progress Notes (Signed)
Pre visit review using our clinic review tool, if applicable. No additional management support is needed unless otherwise documented below in the visit note. 

## 2014-05-20 NOTE — Assessment & Plan Note (Signed)
Worse Doesn't seem to be helped by the clonazepam Will try changing to alprazolam (which should be safer anyway with shorter half life)

## 2014-05-20 NOTE — Progress Notes (Signed)
Subjective:    Patient ID: Randy Mcdaniel, male    DOB: Jul 07, 1941, 73 y.o.   MRN: [id number]  HPI Here with wife  Doing "not good" Having panic attacks 3-4 times a day No obvious stressors--- just started again about a month ago Very nervous--even now Regular with the clonazepam---taking all other meds  Some depression as well "It has all come together"  The walking problem seems to be related to hip arthritis Went to chiropractor and got x-rays---right hip bad Walking better with tylenol arthritis 1300mg  bid Pain is better also  Ongoing numb feet Actually has trouble driving-- can't feel the gas pedal May have to give up driving  Checks sugars once a week or every 2 weeks 85-160 fasting or afternoon No hypoglycemic reactions  Current Outpatient Prescriptions on File Prior to Visit  Medication Sig Dispense Refill  . atorvastatin (LIPITOR) 20 MG tablet Take 1 tablet (20 mg total) by mouth daily.  90 tablet  3  . clonazePAM (KLONOPIN) 0.5 MG tablet TAKE 1 TABLET BY MOUTH 3 TIMES A DAY AS NEEDED  90 tablet  0  . escitalopram (LEXAPRO) 20 MG tablet TAKE 2 TABLETS BY MOUTH EVERY MORNING  60 tablet  11  . glipiZIDE (GLUCOTROL) 5 MG tablet TAKE 1 TABLET BY MOUTH TWICE A DAY BEFORE A MEAL  60 tablet  11  . glucose blood test strip Dx: 250.40  100 each  6  . levothyroxine (SYNTHROID, LEVOTHROID) 75 MCG tablet TAKE 1 TABLET BY MOUTH ONCE A DAY  90 tablet  3  . lisinopril (PRINIVIL,ZESTRIL) 10 MG tablet TAKE 1 TABLET BY MOUTH ONCE A DAY  90 tablet  3  . metFORMIN (GLUCOPHAGE) 1000 MG tablet TAKE 1 TABLET BY MOUTH ONCE IN THE MORNING, THEN TAKE 1/2 TABLET BY MOUTH AT LUNCH, THEN 1 TABLET AT BEDTIME  225 tablet  3  . methylphenidate (RITALIN) 5 MG tablet Take 1 tablet (5 mg total) by mouth 2 (two) times daily.  60 tablet  0  . naproxen sodium (ANAPROX) 220 MG tablet Take 220-440 mg by mouth at bedtime as needed.      Marland Kitchen OLANZapine (ZYPREXA) 10 MG tablet TAKE 1 TABLET BY MOUTH DAILY  AT BEDTIME  30 tablet  11  . vitamin B-12 (CYANOCOBALAMIN) 1000 MCG tablet Take 1,000 mcg by mouth daily.         No current facility-administered medications on file prior to visit.    Allergies  Allergen Reactions  . Ezetimibe-Simvastatin     REACTION: nausea    Past Medical History  Diagnosis Date  . Anxiety   . Depression     recurrent and severe  . Diabetes mellitus     Type II with nephropathy  . HLD (hyperlipidemia)   . Osteoarthritis   . Hypothyroidism   . Personal history of colonic adenoma 12/03/2012    Past Surgical History  Procedure Laterality Date  . Umbilical hernia repair  2000  . 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 Clair Gulling ([facility])  . Total knee arthroplasty  6/10    Left-Dr. Clair Gulling ([facility])    Family History  Problem Relation Age of Onset  . Heart failure Father   . Diabetes Father   . Depression Father   . Coronary artery disease Father   . Breast cancer Sister   . Lung cancer Sister     ?   . Depression Sister   . Stroke      Paternal  side  . Parkinsonism      Cousin  . Colon cancer Neg Hx   . Rectal cancer Neg Hx   . Esophageal cancer Neg Hx   . Stomach cancer Mother     History   Social History  . Marital Status: Married    Spouse Name: N/A    Number of Children: 2  . Years of Education: N/A   Occupational History  . Retired    Social History Main Topics  . Smoking status: Never Smoker   . Smokeless tobacco: Former Systems developer    Types: Chew  . Alcohol Use: No  . Drug Use: No  . Sexual Activity: Not on file   Other Topics Concern  . Not on file   Social History Narrative   Has living will.   No formal health care POA---requests wife to be   Would accept resuscitation attempts   No tube feeds if cognitively unaware   Review of Systems Doing jigsaw puzzles and seems to enjoy this--- better than just sitting in chair watching TV (per wife) Sleeps well--"like a log" Appetite is good Weight is stable    Objective:    Physical Exam  Constitutional: He appears well-developed and well-nourished. No distress.  Neck: Normal range of motion. Neck supple. No thyromegaly present.  Cardiovascular: Normal rate, regular rhythm, normal heart sounds and intact distal pulses.  Exam reveals no gallop.   No murmur heard. Pulmonary/Chest: Effort normal and breath sounds normal. No respiratory distress. He has no wheezes. He has no rales.  Musculoskeletal: He exhibits no edema and no tenderness.  Lymphadenopathy:    He has no cervical adenopathy.  Skin:  Scaling on right foot but no ulcers  Psychiatric:  Anxious with rubbing his legs, etc Depressed mood with somewhat restricted affect          Assessment & Plan:

## 2014-05-20 NOTE — Assessment & Plan Note (Signed)
Still seems to have good control Due for labs 

## 2014-05-20 NOTE — Patient Instructions (Signed)
Stop the clonazepam and replace it with alprazolam. Let me know if the panic symptoms don't settle down soon.

## 2014-05-20 NOTE — Assessment & Plan Note (Signed)
Mildly worse due to the panic symptoms Continue all his meds for this

## 2014-05-20 NOTE — Telephone Encounter (Signed)
Okay #90 x 0 

## 2014-05-20 NOTE — Telephone Encounter (Signed)
Changed at OV

## 2014-05-20 NOTE — Assessment & Plan Note (Signed)
Some pain Clearly adding to his gait problems No Rx

## 2014-05-21 LAB — HEMOGLOBIN A1C: Hgb A1c MFr Bld: 7.4 % — ABNORMAL HIGH (ref 4.6–6.5)

## 2014-06-03 ENCOUNTER — Other Ambulatory Visit: Payer: Self-pay | Admitting: Internal Medicine

## 2014-06-03 ENCOUNTER — Telehealth: Payer: Self-pay

## 2014-06-03 NOTE — Telephone Encounter (Signed)
Randy Mcdaniel left v/m; Xanax that replaced Klonopin is making pt worse; pt is extremely nervous; Does pt need to be seen or what to do? Randy Mcdaniel request cb

## 2014-06-03 NOTE — Telephone Encounter (Signed)
Still having panic/anxiety spells On extensive regimen Thinks he prefers the alprazolam to the clonazepam (and safer at his age) Will allow him to try 1-2 tid prn to see if higher dose helps Will need earlier refill and he will let me know if this doesn't help

## 2014-06-09 ENCOUNTER — Other Ambulatory Visit: Payer: Self-pay | Admitting: Internal Medicine

## 2014-06-09 NOTE — Telephone Encounter (Signed)
Electronic refill request, Dr. Silvio Pate out of office, please advise

## 2014-06-09 NOTE — Telephone Encounter (Signed)
The chart indicates that it may have been refilled 6 d ago - ? Amount -and it is not entirely clear Can you please check on this before I refill it ? Thanks

## 2014-06-10 NOTE — Telephone Encounter (Signed)
I can not tell either, pt's PCP will be back tomorrow, forwarded Rx refill to him

## 2014-06-11 NOTE — Telephone Encounter (Signed)
Rx called in to pharmacy. 

## 2014-06-11 NOTE — Telephone Encounter (Signed)
Okay #90 x 0 

## 2014-06-19 ENCOUNTER — Telehealth: Payer: Self-pay

## 2014-06-19 NOTE — Telephone Encounter (Signed)
Tamy RN with [employer] left v/m requesting pts most recent BP and A1C. From previous staff message was advised that medical release for this info should be faxed to office or have pt call for information and pt relay info to [place]. Left detailed v/m for Tammy with this info.

## 2014-06-23 ENCOUNTER — Other Ambulatory Visit: Payer: Self-pay

## 2014-06-23 MED ORDER — METHYLPHENIDATE HCL 5 MG PO TABS: 5.0000 mg | ORAL_TABLET | Freq: Two times a day (BID) | ORAL | Status: DC

## 2014-06-23 NOTE — Telephone Encounter (Signed)
Spoke with patient and advised rx ready for pick-up and it will be at the front desk.  

## 2014-06-23 NOTE — Telephone Encounter (Signed)
Pt left v/m requesting rx ritalin. Call when ready for pick up. 

## 2014-07-07 ENCOUNTER — Other Ambulatory Visit: Payer: Self-pay | Admitting: Internal Medicine

## 2014-07-07 NOTE — Telephone Encounter (Signed)
Okay #90 x 0 

## 2014-07-07 NOTE — Telephone Encounter (Signed)
rx called into pharmacy

## 2014-07-07 NOTE — Telephone Encounter (Signed)
06/11/14 

## 2014-07-21 ENCOUNTER — Other Ambulatory Visit: Payer: Self-pay

## 2014-07-21 ENCOUNTER — Other Ambulatory Visit: Payer: Self-pay | Admitting: Internal Medicine

## 2014-07-21 MED ORDER — METHYLPHENIDATE HCL 5 MG PO TABS: 5.0000 mg | ORAL_TABLET | Freq: Two times a day (BID) | ORAL | Status: DC

## 2014-07-21 NOTE — Telephone Encounter (Signed)
Spoke with patient and advised rx ready for pick-up and it will be at the front desk.  

## 2014-07-21 NOTE — Telephone Encounter (Signed)
Pt left v/m requesting rx ritalin. Call when ready for pick up. 

## 2014-08-11 ENCOUNTER — Other Ambulatory Visit: Payer: Self-pay | Admitting: Internal Medicine

## 2014-08-11 NOTE — Telephone Encounter (Signed)
07/07/14 

## 2014-08-11 NOTE — Telephone Encounter (Signed)
Approved: #90 x 0 

## 2014-08-11 NOTE — Telephone Encounter (Signed)
rx called into pharmacy

## 2014-08-18 ENCOUNTER — Other Ambulatory Visit: Payer: Self-pay | Admitting: Internal Medicine

## 2014-08-18 ENCOUNTER — Other Ambulatory Visit: Payer: Self-pay

## 2014-08-18 MED ORDER — METHYLPHENIDATE HCL 5 MG PO TABS: 5.0000 mg | ORAL_TABLET | Freq: Two times a day (BID) | ORAL | Status: DC

## 2014-08-18 NOTE — Telephone Encounter (Signed)
Spoke with patient and advised rx ready for pick-up and it will be at the front desk.  

## 2014-08-18 NOTE — Telephone Encounter (Signed)
Pt left v/m requesting refill ritalin. Call when ready for pick up.

## 2014-09-08 ENCOUNTER — Other Ambulatory Visit: Payer: Self-pay | Admitting: Internal Medicine

## 2014-09-08 NOTE — Telephone Encounter (Signed)
rx called into pharmacy

## 2014-09-08 NOTE — Telephone Encounter (Signed)
Approved: #90 x 0 

## 2014-09-08 NOTE — Telephone Encounter (Signed)
08/11/14 

## 2014-09-17 ENCOUNTER — Encounter: Payer: Self-pay | Admitting: Internal Medicine

## 2014-09-17 ENCOUNTER — Ambulatory Visit (INDEPENDENT_AMBULATORY_CARE_PROVIDER_SITE_OTHER): Payer: 59 | Admitting: Internal Medicine

## 2014-09-17 VITALS — BP 130/70 | HR 102 | Temp 98.0°F | Wt 216.0 lb

## 2014-09-17 DIAGNOSIS — M48062 Spinal stenosis, lumbar region with neurogenic claudication: Secondary | ICD-10-CM | POA: Insufficient documentation

## 2014-09-17 DIAGNOSIS — F325 Major depressive disorder, single episode, in full remission: Secondary | ICD-10-CM

## 2014-09-17 DIAGNOSIS — M1611 Unilateral primary osteoarthritis, right hip: Secondary | ICD-10-CM

## 2014-09-17 DIAGNOSIS — F324 Major depressive disorder, single episode, in partial remission: Secondary | ICD-10-CM

## 2014-09-17 MED ORDER — METHYLPHENIDATE HCL 5 MG PO TABS: 5.0000 mg | ORAL_TABLET | Freq: Two times a day (BID) | ORAL | Status: DC

## 2014-09-17 NOTE — Progress Notes (Signed)
Pre visit review using our clinic review tool, if applicable. No additional management support is needed unless otherwise documented below in the visit note. 

## 2014-09-17 NOTE — Assessment & Plan Note (Signed)
Fairly typical pain and exam but the numbness still makes me concerned about sciatica Will have him restart the acetaminophen Orthopedic evaluation--if arthritis, may be worth trying cortisone shot but may need to consider THR

## 2014-09-17 NOTE — Progress Notes (Signed)
Subjective:    Patient ID: Randy Mcdaniel, male    DOB: 12-Aug-1941, 74 y.o.   MRN: [id number]  HPI Here with wife due to worsening right hip pain  Long standing problems but worse for a month Has pain in hip and numbness that goes down leg Some weakness  Hasn't taken any meds Was on tylenol in past but stopped  Asked if I could do cortisone shot---explained it needs fluoro  Current Outpatient Prescriptions on File Prior to Visit  Medication Sig Dispense Refill  . acetaminophen (TYLENOL) 650 MG CR tablet Take 1,300 mg by mouth 2 (two) times daily.    Marland Kitchen ALPRAZolam (XANAX) 0.5 MG tablet TAKE 1 TABLET BY MOUTH 3 TIMES A DAY AS NEEDED 90 tablet 0  . atorvastatin (LIPITOR) 20 MG tablet Take 1 tablet (20 mg total) by mouth daily. 90 tablet 3  . escitalopram (LEXAPRO) 20 MG tablet TAKE 2 TABLETS BY MOUTH EVERY MORNING 60 tablet 11  . glipiZIDE (GLUCOTROL) 5 MG tablet TAKE 1 TABLET BY MOUTH TWICE A DAY BEFORE A MEAL 60 tablet 11  . glucose blood test strip Dx: 250.40 100 each 6  . levothyroxine (SYNTHROID, LEVOTHROID) 75 MCG tablet TAKE 1 TABLET BY MOUTH ONCE A DAY 90 tablet 1  . lisinopril (PRINIVIL,ZESTRIL) 10 MG tablet TAKE 1 TABLET BY MOUTH ONCE A DAY 90 tablet 3  . metFORMIN (GLUCOPHAGE) 1000 MG tablet TAKE 1 TABLET BY MOUTH ONCE IN THE MORNING, THEN TAKE 1/2 TABLET BY MOUTH AT LUNCH, THEN 1 TABLET AT BEDTIME 225 tablet 3  . methylphenidate (RITALIN) 5 MG tablet Take 1 tablet (5 mg total) by mouth 2 (two) times daily. 60 tablet 0  . naproxen sodium (ANAPROX) 220 MG tablet Take 220-440 mg by mouth at bedtime as needed.    Marland Kitchen OLANZapine (ZYPREXA) 10 MG tablet TAKE 1 TABLET BY MOUTH EVERY NIGHT AT BEDTIME 30 tablet 11  . vitamin B-12 (CYANOCOBALAMIN) 1000 MCG tablet Take 1,000 mcg by mouth daily.       No current facility-administered medications on file prior to visit.    Allergies  Allergen Reactions  . Ezetimibe-Simvastatin     REACTION: nausea    Past Medical History    Diagnosis Date  . Anxiety   . Depression     recurrent and severe  . Diabetes mellitus     Type II with nephropathy  . HLD (hyperlipidemia)   . Osteoarthritis   . Hypothyroidism   . Personal history of colonic adenoma 12/03/2012    Past Surgical History  Procedure Laterality Date  . Umbilical hernia repair  2000  . 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 Clair Gulling ([facility])  . Total knee arthroplasty  6/10    Left-Dr. Clair Gulling ([facility])    Family History  Problem Relation Age of Onset  . Heart failure Father   . Diabetes Father   . Depression Father   . Coronary artery disease Father   . Breast cancer Sister   . Lung cancer Sister     ?   . Depression Sister   . Stroke      Paternal side  . Parkinsonism      Cousin  . Colon cancer Neg Hx   . Rectal cancer Neg Hx   . Esophageal cancer Neg Hx   . Stomach cancer Mother     History   Social History  . Marital Status: Married    Spouse Name: N/A    Number  of Children: 2  . Years of Education: N/A   Occupational History  . Retired    Social History Main Topics  . Smoking status: Never Smoker   . Smokeless tobacco: Former Systems developer    Types: Chew  . Alcohol Use: No  . Drug Use: No  . Sexual Activity: Not on file   Other Topics Concern  . Not on file   Social History Narrative   Has living will.   No formal health care POA---requests wife to be   Would accept resuscitation attempts   No tube feeds if cognitively unaware   Review of Systems Sleeps great Some pain in right shoulder    Objective:   Physical Exam  Constitutional: He appears well-developed and well-nourished. No distress.  Musculoskeletal:  No spine tenderness Right knee is stable and no pain SLR negative bilaterally Pain recreated with passive internal rotation of right hip (which is very limited)  Neurological:  Very slow gait but not antalgic No focal weakness          Assessment & Plan:

## 2014-09-17 NOTE — Assessment & Plan Note (Signed)
Here with wife Mood has been good He is going to try to cut down to just 1 methylphenidate a day

## 2014-10-02 ENCOUNTER — Ambulatory Visit: Payer: Self-pay | Admitting: General Practice

## 2014-10-06 ENCOUNTER — Other Ambulatory Visit: Payer: Self-pay | Admitting: Internal Medicine

## 2014-10-06 NOTE — Telephone Encounter (Signed)
Received refill request electronically from pharmacy. Last refill 09-08-14 #90, last office visit 09/17/14. Is it okay to refill medication?

## 2014-10-07 NOTE — Telephone Encounter (Signed)
Approved: #90 x 0 

## 2014-10-07 NOTE — Telephone Encounter (Signed)
Rx called to pharmacy

## 2014-11-03 ENCOUNTER — Other Ambulatory Visit: Payer: Self-pay | Admitting: Internal Medicine

## 2014-11-03 NOTE — Telephone Encounter (Signed)
Ok to phone in xanax to fill on or after 11/05/14

## 2014-11-03 NOTE — Telephone Encounter (Signed)
Will call in on 11/05/2014

## 2014-11-03 NOTE — Telephone Encounter (Signed)
Received refill request electronically from pharmacy. Last refill 10/07/14 #90, last office visit 09/17/14. Is it okay to refill medication?

## 2014-11-05 NOTE — Telephone Encounter (Signed)
Rx called in to pharmacy. 

## 2014-11-18 ENCOUNTER — Ambulatory Visit (INDEPENDENT_AMBULATORY_CARE_PROVIDER_SITE_OTHER): Payer: Medicare Other | Admitting: Internal Medicine

## 2014-11-18 ENCOUNTER — Encounter: Payer: Self-pay | Admitting: Internal Medicine

## 2014-11-18 VITALS — BP 130/70 | HR 101 | Temp 97.4°F | Wt 214.0 lb

## 2014-11-18 DIAGNOSIS — M48062 Spinal stenosis, lumbar region with neurogenic claudication: Secondary | ICD-10-CM

## 2014-11-18 DIAGNOSIS — E039 Hypothyroidism, unspecified: Secondary | ICD-10-CM

## 2014-11-18 DIAGNOSIS — E1149 Type 2 diabetes mellitus with other diabetic neurological complication: Secondary | ICD-10-CM

## 2014-11-18 DIAGNOSIS — E1142 Type 2 diabetes mellitus with diabetic polyneuropathy: Secondary | ICD-10-CM

## 2014-11-18 DIAGNOSIS — E785 Hyperlipidemia, unspecified: Secondary | ICD-10-CM

## 2014-11-18 DIAGNOSIS — M4806 Spinal stenosis, lumbar region: Secondary | ICD-10-CM

## 2014-11-18 DIAGNOSIS — F325 Major depressive disorder, single episode, in full remission: Secondary | ICD-10-CM

## 2014-11-18 DIAGNOSIS — F324 Major depressive disorder, single episode, in partial remission: Secondary | ICD-10-CM

## 2014-11-18 DIAGNOSIS — E1140 Type 2 diabetes mellitus with diabetic neuropathy, unspecified: Secondary | ICD-10-CM

## 2014-11-18 LAB — COMPREHENSIVE METABOLIC PANEL
ALBUMIN: 4.1 g/dL (ref 3.5–5.2)
ALK PHOS: 47 U/L (ref 39–117)
ALT: 12 U/L (ref 0–53)
AST: 19 U/L (ref 0–37)
BUN: 16 mg/dL (ref 6–23)
CALCIUM: 9.5 mg/dL (ref 8.4–10.5)
CO2: 27 mEq/L (ref 19–32)
Chloride: 103 mEq/L (ref 96–112)
Creatinine, Ser: 1.21 mg/dL (ref 0.40–1.50)
GFR: 62.13 mL/min (ref >60.00)
GLUCOSE: 150 mg/dL — AB (ref 70–99)
POTASSIUM: 4.4 meq/L (ref 3.5–5.1)
Sodium: 137 mEq/L (ref 135–145)
Total Bilirubin: 0.4 mg/dL (ref 0.2–1.2)
Total Protein: 6.9 g/dL (ref 6.0–8.3)

## 2014-11-18 LAB — CBC WITH DIFFERENTIAL/PLATELET
BASOS PCT: 0.7 % (ref 0.0–3.0)
Basophils Absolute: 0.1 10*3/uL (ref 0.0–0.1)
EOS PCT: 3.6 % (ref 0.0–5.0)
Eosinophils Absolute: 0.3 10*3/uL (ref 0.0–0.7)
HCT: 39.3 % (ref 39.0–52.0)
HEMOGLOBIN: 13.3 g/dL (ref 13.0–17.0)
Lymphocytes Relative: 23.5 % (ref 12.0–46.0)
Lymphs Abs: 1.8 10*3/uL (ref 0.7–4.0)
MCHC: 33.7 g/dL (ref 30.0–36.0)
MCV: 87.4 fl (ref 78.0–100.0)
MONO ABS: 0.4 10*3/uL (ref 0.1–1.0)
MONOS PCT: 5.6 % (ref 3.0–12.0)
NEUTROS ABS: 5.1 10*3/uL (ref 1.4–7.7)
Neutrophils Relative %: 66.6 % (ref 43.0–77.0)
Platelets: 261 10*3/uL (ref 150.0–400.0)
RBC: 4.5 Mil/uL (ref 4.22–5.81)
RDW: 14.9 % (ref 11.5–15.5)
WBC: 7.7 10*3/uL (ref 4.0–10.5)

## 2014-11-18 LAB — T4, FREE: Free T4: 0.81 ng/dL (ref 0.60–1.60)

## 2014-11-18 LAB — TSH: TSH: 4.42 u[IU]/mL (ref 0.35–4.50)

## 2014-11-18 LAB — LIPID PANEL
CHOL/HDL RATIO: 4
Cholesterol: 150 mg/dL (ref 0–200)
HDL: 38.3 mg/dL — AB (ref >39.00)
LDL CALC: 74 mg/dL (ref 0–99)
NONHDL: 111.7
Triglycerides: 187 mg/dL — ABNORMAL HIGH (ref 0.0–149.0)
VLDL: 37.4 mg/dL (ref 0.0–40.0)

## 2014-11-18 LAB — HM DIABETES FOOT EXAM

## 2014-11-18 LAB — HEMOGLOBIN A1C: Hgb A1c MFr Bld: 7.2 % — ABNORMAL HIGH (ref 4.6–6.5)

## 2014-11-18 MED ORDER — METHYLPHENIDATE HCL 5 MG PO TABS: 5.0000 mg | ORAL_TABLET | Freq: Two times a day (BID) | ORAL | Status: DC

## 2014-11-18 NOTE — Assessment & Plan Note (Signed)
Seems euthyroid

## 2014-11-18 NOTE — Progress Notes (Signed)
Pre visit review using our clinic review tool, if applicable. No additional management support is needed unless otherwise documented below in the visit note. 

## 2014-11-18 NOTE — Assessment & Plan Note (Signed)
Had 1 ESI with Dr Sharlet Salina and scheduled for a second

## 2014-11-18 NOTE — Assessment & Plan Note (Signed)
Still seems to have acceptable control Will recheck labs Needs eye exam

## 2014-11-18 NOTE — Assessment & Plan Note (Signed)
No problems with statin 

## 2014-11-18 NOTE — Assessment & Plan Note (Signed)
Just sensory and no pain No Rx needed

## 2014-11-18 NOTE — Progress Notes (Signed)
Subjective:    Patient ID: Randy Land., male    DOB: Dec 18, 1940, 74 y.o.   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  No hypoglycemic reactions Overdue for eye exam Feet still tingle but no pain or ulcers  No chest pain No breathing problems No dizziness or syncope Current Outpatient Prescriptions on File Prior to Visit  Medication Sig Dispense Refill  . acetaminophen (TYLENOL) 650 MG CR tablet Take 650 mg by mouth 3 (three) times daily.     Marland Kitchen ALPRAZolam (XANAX) 0.5 MG tablet TAKE 1 TABLET BY MOUTH 3 TIMES A DAY AS NEEDED 90 tablet 0  . atorvastatin (LIPITOR) 20 MG tablet Take 1 tablet (20 mg total) by mouth daily. 90 tablet 3  . escitalopram (LEXAPRO) 20 MG tablet TAKE 2 TABLETS BY MOUTH EVERY MORNING 60 tablet 11  . glipiZIDE (GLUCOTROL) 5 MG tablet TAKE 1 TABLET BY MOUTH TWICE A DAY BEFORE A MEAL 60 tablet 11  . glucose blood test strip Dx: 250.40 100 each 6  . levothyroxine (SYNTHROID, LEVOTHROID) 75 MCG tablet TAKE 1 TABLET BY MOUTH ONCE A DAY 90 tablet 1  . lisinopril (PRINIVIL,ZESTRIL) 10 MG tablet TAKE 1 TABLET BY MOUTH ONCE A DAY 90 tablet 3  . metFORMIN (GLUCOPHAGE) 1000 MG tablet TAKE 1 TABLET BY MOUTH ONCE IN THE MORNING, THEN TAKE 1/2 TABLET BY MOUTH AT LUNCH, THEN 1 TABLET AT BEDTIME 225 tablet 3  . naproxen sodium (ANAPROX) 220 MG tablet Take 220-440 mg by mouth at bedtime as needed.    Marland Kitchen OLANZapine (ZYPREXA) 10 MG tablet TAKE 1 TABLET BY MOUTH EVERY NIGHT AT BEDTIME 30 tablet 11  . vitamin B-12 (CYANOCOBALAMIN) 1000 MCG tablet Take  1,000 mcg by mouth daily.       No current facility-administered medications on file prior to visit.    Allergies  Allergen Reactions  . Ezetimibe-Simvastatin     REACTION: nausea    Past Medical History  Diagnosis Date  . Anxiety   . Depression     recurrent and severe  . Diabetes mellitus     Type II with nephropathy  . HLD (hyperlipidemia)   . Osteoarthritis   . Hypothyroidism   . Personal history of colonic adenoma 12/03/2012    Past Surgical History  Procedure Laterality Date  . Umbilical hernia repair  2000  . 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 Clair Gulling ([facility])  . Total knee arthroplasty  6/10    Left-Dr. Clair Gulling ([facility])    Family History  Problem Relation Age of Onset  . Heart failure Father   . Diabetes Father   . Depression Father   . Coronary artery disease Father   . Breast cancer Sister   . Lung cancer Sister     ?   . Depression Sister   . Stroke      Paternal side  . Parkinsonism      Cousin  . Colon cancer Neg Hx   . Rectal cancer Neg Hx   . Esophageal cancer Neg Hx   .  Stomach cancer Mother     History   Social History  . Marital Status: Married    Spouse Name: N/A  . Number of Children: 2  . Years of Education: N/A   Occupational History  . Retired    Social History Main Topics  . Smoking status: Never Smoker   . Smokeless tobacco: Former Systems developer    Types: Chew  . Alcohol Use: No  . Drug Use: No  . Sexual Activity: Not on file   Other Topics Concern  . Not on file   Social History Narrative   Has living will.   No formal health care POA---requests wife to be   Would accept resuscitation attempts   No tube feeds if cognitively unaware   Review of Systems Sleeping is fine Appetite is good Weight is down 2# since last visit    Objective:   Physical Exam  Constitutional: Randy Mcdaniel appears well-developed and well-nourished. No distress.  Neck: Normal range of motion. Neck supple. No thyromegaly present.  Cardiovascular:  Normal rate, regular rhythm, normal heart sounds and intact distal pulses.  Exam reveals no gallop.   No murmur heard. Pulmonary/Chest: Effort normal and breath sounds normal. No respiratory distress. Randy Mcdaniel has no wheezes. Randy Mcdaniel has no rales.  Abdominal: Soft. There is no tenderness.  Musculoskeletal: Randy Mcdaniel exhibits no edema or tenderness.  Lymphadenopathy:    Randy Mcdaniel has no cervical adenopathy.  Neurological:  Decreased sensation in feet  Skin:  No foot lesions 2-3 mycotic toenails on right foot  Psychiatric: Randy Mcdaniel has a normal mood and affect. His behavior is normal.          Assessment & Plan:

## 2014-11-18 NOTE — Assessment & Plan Note (Signed)
Controlled with escitalopram, methylphenidate and olanzapine. No wean is appropriate

## 2014-11-18 NOTE — Patient Instructions (Signed)
Please set up your diabetic eye exam. 

## 2014-12-02 ENCOUNTER — Other Ambulatory Visit: Payer: Self-pay | Admitting: Internal Medicine

## 2014-12-08 ENCOUNTER — Other Ambulatory Visit: Payer: Self-pay | Admitting: Internal Medicine

## 2014-12-08 NOTE — Telephone Encounter (Signed)
Approved: #90 x 0 

## 2014-12-08 NOTE — Telephone Encounter (Signed)
Last filled 11/03/2014 #90--last OV 11/18/2014 with you--please advise

## 2014-12-08 NOTE — Telephone Encounter (Signed)
Rx called to pharmacy as instructed. 

## 2014-12-18 LAB — HM DIABETES EYE EXAM

## 2014-12-19 ENCOUNTER — Encounter: Payer: Self-pay | Admitting: Internal Medicine

## 2015-01-05 ENCOUNTER — Other Ambulatory Visit: Payer: Self-pay | Admitting: Internal Medicine

## 2015-01-05 NOTE — Telephone Encounter (Signed)
Approved: #90 x 0 

## 2015-01-05 NOTE — Telephone Encounter (Signed)
rx called into pharmacy

## 2015-01-05 NOTE — Telephone Encounter (Signed)
12/08/14 

## 2015-01-06 ENCOUNTER — Ambulatory Visit (INDEPENDENT_AMBULATORY_CARE_PROVIDER_SITE_OTHER): Payer: Medicare Other | Admitting: Internal Medicine

## 2015-01-06 ENCOUNTER — Encounter: Payer: Self-pay | Admitting: Internal Medicine

## 2015-01-06 VITALS — BP 120/70 | HR 99 | Temp 98.4°F | Wt 217.0 lb

## 2015-01-06 DIAGNOSIS — R197 Diarrhea, unspecified: Secondary | ICD-10-CM | POA: Insufficient documentation

## 2015-01-06 MED ORDER — METFORMIN HCL 500 MG PO TABS: 500.0000 mg | ORAL_TABLET | Freq: Three times a day (TID) | ORAL | Status: DC

## 2015-01-06 NOTE — Progress Notes (Signed)
Subjective:    Patient ID: Randy Mcdaniel., male    DOB: [birth year], 74 y.o.   MRN: [id number]  HPI Here with wife due to diarrhea Having trouble keeping anything on his stomach--but no upper GI symptoms  Feels the loose stools goes back at least a month Gets urgency and runs to bathroom Very loose Occurring 4-5 times per day  No fever At first, no nausea or vomiting and appetite was fine  Started the imodium fairly quickly Taking it multiple times per day Now constipated   Current Outpatient Prescriptions on File Prior to Visit  Medication Sig Dispense Refill  . acetaminophen (TYLENOL) 650 MG CR tablet Take 650 mg by mouth 3 (three) times daily.     Marland Kitchen ALPRAZolam (XANAX) 0.5 MG tablet TAKE 1 TABLET BY MOUTH 3 TIMES A DAY AS NEEDED 90 tablet 0  . atorvastatin (LIPITOR) 20 MG tablet Take 1 tablet (20 mg total) by mouth daily. 90 tablet 3  . escitalopram (LEXAPRO) 20 MG tablet TAKE 2 TABLETS BY MOUTH EVERY MORNING 60 tablet 11  . glipiZIDE (GLUCOTROL) 5 MG tablet TAKE 1 TABLET BY MOUTH TWICE A DAY BEFORE A MEAL 60 tablet 11  . glucose blood test strip Dx: 250.40 100 each 6  . levothyroxine (SYNTHROID, LEVOTHROID) 75 MCG tablet TAKE 1 TABLET BY MOUTH ONCE A DAY 90 tablet 3  . lisinopril (PRINIVIL,ZESTRIL) 10 MG tablet TAKE 1 TABLET BY MOUTH ONCE A DAY 90 tablet 3  . metFORMIN (GLUCOPHAGE) 1000 MG tablet TAKE 1 TABLET BY MOUTH ONCE IN THE MORNING, THEN TAKE 1/2 TABLET BY MOUTH AT LUNCH, THEN 1 TABLET AT BEDTIME 225 tablet 3  . methylphenidate (RITALIN) 5 MG tablet Take 1 tablet (5 mg total) by mouth 2 (two) times daily. 60 tablet 0  . methylphenidate (RITALIN) 5 MG tablet Take 1 tablet (5 mg total) by mouth 2 (two) times daily. 60 tablet 0  . methylphenidate (RITALIN) 5 MG tablet Take 1 tablet (5 mg total) by mouth 2 (two) times daily. 60 tablet 0  . naproxen sodium (ANAPROX) 220 MG tablet Take 220-440 mg by mouth at bedtime as needed.    Marland Kitchen OLANZapine (ZYPREXA) 10 MG tablet  TAKE 1 TABLET BY MOUTH EVERY NIGHT AT BEDTIME 30 tablet 11  . vitamin B-12 (CYANOCOBALAMIN) 1000 MCG tablet Take 1,000 mcg by mouth daily.       No current facility-administered medications on file prior to visit.    Allergies  Allergen Reactions  . Ezetimibe-Simvastatin     REACTION: nausea    Past Medical History  Diagnosis Date  . Anxiety   . Depression     recurrent and severe  . Diabetes mellitus     Type II with nephropathy  . HLD (hyperlipidemia)   . Osteoarthritis   . Hypothyroidism   . Personal history of colonic adenoma 12/03/2012    Past Surgical History  Procedure Laterality Date  . Umbilical hernia repair  2000  . 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 Clair Gulling ([facility])  . Total knee arthroplasty  6/10    Left-Dr. Clair Gulling ([facility])    Family History  Problem Relation Age of Onset  . Heart failure Father   . Diabetes Father   . Depression Father   . Coronary artery disease Father   . Breast cancer Sister   . Lung cancer Sister     ?   . Depression Sister   . Stroke  Paternal side  . Parkinsonism      Cousin  . Colon cancer Neg Hx   . Rectal cancer Neg Hx   . Esophageal cancer Neg Hx   . Stomach cancer Mother     History   Social History  . Marital Status: Married    Spouse Name: N/A  . Number of Children: 2  . Years of Education: N/A   Occupational History  . Retired    Social History Main Topics  . Smoking status: Never Smoker   . Smokeless tobacco: Former Systems developer    Types: Chew  . Alcohol Use: No  . Drug Use: No  . Sexual Activity: Not on file   Other Topics Concern  . Not on file   Social History Narrative   Has living will.   No formal health care POA---requests wife to be   Would accept resuscitation attempts   No tube feeds if cognitively unaware   Review of Systems Hasn't changed his eating Never had lactose intolerance Weight is up 3#    Objective:   Physical Exam  Constitutional: He appears well-developed and  well-nourished. No distress.  Neck: Normal range of motion. Neck supple.  Pulmonary/Chest: Effort normal and breath sounds normal. No respiratory distress. He has no wheezes. He has no rales.  Abdominal: Soft. Bowel sounds are normal. He exhibits no distension. There is no tenderness. There is no rebound and no guarding.          Assessment & Plan:

## 2015-01-06 NOTE — Patient Instructions (Signed)
Decrease the metformin to 500mg  three times a day. Stop the imodium. In the future, it would be okay to take this once in a while (like 1-2 times per week) if you still have loose stools despite the lower metformin dose.

## 2015-01-06 NOTE — Progress Notes (Signed)
Pre visit review using our clinic review tool, if applicable. No additional management support is needed unless otherwise documented below in the visit note. 

## 2015-01-06 NOTE — Assessment & Plan Note (Signed)
Not an apparent illness Most likely due to metformin even though he used to tolerate Now overdoing it with imodium and no stool in 2 days Nothing to suggest impaction of stool causing the diarrhea---but would check KUB if things don't open up  Asked him to cut metformin dose to 500 tid Use the imodium only once in a while (like if going out)

## 2015-02-03 ENCOUNTER — Ambulatory Visit (INDEPENDENT_AMBULATORY_CARE_PROVIDER_SITE_OTHER): Payer: Medicare Other | Admitting: Internal Medicine

## 2015-02-03 ENCOUNTER — Encounter: Payer: Self-pay | Admitting: Internal Medicine

## 2015-02-03 VITALS — BP 130/70 | HR 109 | Temp 97.7°F | Wt 210.0 lb

## 2015-02-03 DIAGNOSIS — R197 Diarrhea, unspecified: Secondary | ICD-10-CM

## 2015-02-03 MED ORDER — CHOLESTYRAMINE LIGHT 4 G PO PACK
4.0000 g | PACK | Freq: Two times a day (BID) | ORAL | Status: DC
Start: 2015-02-03 — End: 2015-09-23

## 2015-02-03 NOTE — Assessment & Plan Note (Signed)
Persists despite the decrease in metformin Can't regulate with imodium---does too much Doesn't want to decrease metformin more  Will try cholestyramine and titrate dose Consider change to metformin ER Also could try lower dose of imodium

## 2015-02-03 NOTE — Patient Instructions (Signed)
Stop the imodium. Try the cholestyramine--probably at 1 pack a day with a full glass of water. You can adjust the dose (increase if you still have loose stools and decrease to 1/2 packet if bound up). Contact me on MyChart if you have questions

## 2015-02-03 NOTE — Progress Notes (Signed)
Pre visit review using our clinic review tool, if applicable. No additional management support is needed unless otherwise documented below in the visit note. 

## 2015-02-03 NOTE — Progress Notes (Signed)
Subjective:    Patient ID: Randy Mcdaniel., male    DOB: 09/01/1940, 74 y.o.   MRN: [id number]  HPI Here due to diarrhea Wife is with him  Ongoing diarrhea ---still going on presumably due to metformin Decreased to 1/2 tab tid but still ongoing If he doesn't take imodium-- he goes 4-5 times a day If he takes imodium--it shuts him down for 2-3 days and he gets bound up  Stomach rolling after eating Appetite is okay but scared to eat--afraid he will have to run to the bathroom  No abdominal pain--just slightly lower discomfort  Current Outpatient Prescriptions on File Prior to Visit  Medication Sig Dispense Refill  . acetaminophen (TYLENOL) 650 MG CR tablet Take 650 mg by mouth 3 (three) times daily.     Marland Kitchen ALPRAZolam (XANAX) 0.5 MG tablet TAKE 1 TABLET BY MOUTH 3 TIMES A DAY AS NEEDED 90 tablet 0  . atorvastatin (LIPITOR) 20 MG tablet Take 1 tablet (20 mg total) by mouth daily. 90 tablet 3  . escitalopram (LEXAPRO) 20 MG tablet TAKE 2 TABLETS BY MOUTH EVERY MORNING 60 tablet 11  . glipiZIDE (GLUCOTROL) 5 MG tablet TAKE 1 TABLET BY MOUTH TWICE A DAY BEFORE A MEAL 60 tablet 11  . glucose blood test strip Dx: 250.40 100 each 6  . levothyroxine (SYNTHROID, LEVOTHROID) 75 MCG tablet TAKE 1 TABLET BY MOUTH ONCE A DAY 90 tablet 3  . lisinopril (PRINIVIL,ZESTRIL) 10 MG tablet TAKE 1 TABLET BY MOUTH ONCE A DAY 90 tablet 3  . metFORMIN (GLUCOPHAGE) 500 MG tablet Take 1 tablet (500 mg total) by mouth 3 (three) times daily. 270 tablet 3  . methylphenidate (RITALIN) 5 MG tablet Take 1 tablet (5 mg total) by mouth 2 (two) times daily. 60 tablet 0  . methylphenidate (RITALIN) 5 MG tablet Take 1 tablet (5 mg total) by mouth 2 (two) times daily. 60 tablet 0  . methylphenidate (RITALIN) 5 MG tablet Take 1 tablet (5 mg total) by mouth 2 (two) times daily. 60 tablet 0  . naproxen sodium (ANAPROX) 220 MG tablet Take 220-440 mg by mouth at bedtime as needed.    Marland Kitchen OLANZapine (ZYPREXA) 10 MG tablet  TAKE 1 TABLET BY MOUTH EVERY NIGHT AT BEDTIME 30 tablet 11  . vitamin B-12 (CYANOCOBALAMIN) 1000 MCG tablet Take 1,000 mcg by mouth daily.       No current facility-administered medications on file prior to visit.    Allergies  Allergen Reactions  . Ezetimibe-Simvastatin     REACTION: nausea    Past Medical History  Diagnosis Date  . Anxiety   . Depression     recurrent and severe  . Diabetes mellitus     Type II with nephropathy  . HLD (hyperlipidemia)   . Osteoarthritis   . Hypothyroidism   . Personal history of colonic adenoma 12/03/2012    Past Surgical History  Procedure Laterality Date  . Umbilical hernia repair  2000  . 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 Clair Gulling ([facility])  . Total knee arthroplasty  6/10    Left-Dr. Clair Gulling ([facility])    Family History  Problem Relation Age of Onset  . Heart failure Father   . Diabetes Father   . Depression Father   . Coronary artery disease Father   . Breast cancer Sister   . Lung cancer Sister     ?   . Depression Sister   . Stroke  Paternal side  . Parkinsonism      Cousin  . Colon cancer Neg Hx   . Rectal cancer Neg Hx   . Esophageal cancer Neg Hx   . Stomach cancer Mother     History   Social History  . Marital Status: Married    Spouse Name: N/A  . Number of Children: 2  . Years of Education: N/A   Occupational History  . Retired    Social History Main Topics  . Smoking status: Never Smoker   . Smokeless tobacco: Former Systems developer    Types: Chew  . Alcohol Use: No  . Drug Use: No  . Sexual Activity: Not on file   Other Topics Concern  . Not on file   Social History Narrative   Has living will.   No formal health care POA---requests wife to be   Would accept resuscitation attempts   No tube feeds if cognitively unaware   Review of Systems No fever Weight is down a few pounds--overall not eating as much No breathing problems    Objective:   Physical Exam  Constitutional: He appears  well-developed and well-nourished. No distress.  Neck: Normal range of motion. Neck supple.  Pulmonary/Chest: Effort normal and breath sounds normal. No respiratory distress. He has no wheezes. He has no rales.  Abdominal: Soft. He exhibits no distension. There is no tenderness. There is no rebound and no guarding.  Slightly overactive bowel sounds  Lymphadenopathy:    He has no cervical adenopathy.          Assessment & Plan:

## 2015-02-13 ENCOUNTER — Ambulatory Visit: Payer: Medicare Other | Admitting: Internal Medicine

## 2015-02-16 ENCOUNTER — Other Ambulatory Visit: Payer: Self-pay | Admitting: Internal Medicine

## 2015-02-16 NOTE — Telephone Encounter (Signed)
01/05/15 

## 2015-02-16 NOTE — Telephone Encounter (Signed)
rx called into pharmacy

## 2015-02-16 NOTE — Telephone Encounter (Signed)
Approved: okay #[phone] x [phone] 

## 2015-02-17 ENCOUNTER — Other Ambulatory Visit: Payer: Self-pay | Admitting: Internal Medicine

## 2015-02-17 MED ORDER — METHYLPHENIDATE HCL 5 MG PO TABS: 5.0000 mg | ORAL_TABLET | Freq: Two times a day (BID) | ORAL | Status: DC

## 2015-02-17 NOTE — Telephone Encounter (Signed)
Left message on machine that rx is ready for pick-up, and it will be at our front desk.  

## 2015-02-17 NOTE — Telephone Encounter (Signed)
Patient is asking for [facility] to call him about his medication.

## 2015-02-17 NOTE — Telephone Encounter (Signed)
11/18/2014, pt states his wife can pick up Thursday when she's here for her physical

## 2015-02-23 ENCOUNTER — Other Ambulatory Visit: Payer: Self-pay

## 2015-02-24 ENCOUNTER — Encounter: Payer: Self-pay | Admitting: Internal Medicine

## 2015-04-07 ENCOUNTER — Other Ambulatory Visit: Payer: Self-pay | Admitting: Internal Medicine

## 2015-04-07 NOTE — Telephone Encounter (Signed)
rx called into pharmacy

## 2015-04-07 NOTE — Telephone Encounter (Signed)
Approved: okay #[phone] x [phone] 

## 2015-04-07 NOTE — Telephone Encounter (Signed)
01/2015

## 2015-04-13 ENCOUNTER — Other Ambulatory Visit: Payer: Self-pay | Admitting: Internal Medicine

## 2015-05-18 ENCOUNTER — Other Ambulatory Visit: Payer: Self-pay | Admitting: Internal Medicine

## 2015-05-18 NOTE — Telephone Encounter (Signed)
Approved: okay #[phone] x [phone] 

## 2015-05-18 NOTE — Telephone Encounter (Signed)
04/07/2015 

## 2015-05-19 NOTE — Telephone Encounter (Signed)
rx called into pharmacy

## 2015-05-22 ENCOUNTER — Encounter: Payer: Self-pay | Admitting: Internal Medicine

## 2015-05-22 ENCOUNTER — Ambulatory Visit (INDEPENDENT_AMBULATORY_CARE_PROVIDER_SITE_OTHER): Payer: Medicare Other | Admitting: Internal Medicine

## 2015-05-22 VITALS — BP 120/72 | HR 98 | Temp 98.2°F | Wt 212.0 lb

## 2015-05-22 DIAGNOSIS — F3341 Major depressive disorder, recurrent, in partial remission: Secondary | ICD-10-CM | POA: Diagnosis not present

## 2015-05-22 DIAGNOSIS — Z0000 Encounter for general adult medical examination without abnormal findings: Secondary | ICD-10-CM | POA: Diagnosis not present

## 2015-05-22 DIAGNOSIS — R197 Diarrhea, unspecified: Secondary | ICD-10-CM

## 2015-05-22 DIAGNOSIS — Z7189 Other specified counseling: Secondary | ICD-10-CM

## 2015-05-22 DIAGNOSIS — E1140 Type 2 diabetes mellitus with diabetic neuropathy, unspecified: Secondary | ICD-10-CM

## 2015-05-22 DIAGNOSIS — E1149 Type 2 diabetes mellitus with other diabetic neurological complication: Secondary | ICD-10-CM

## 2015-05-22 LAB — HEMOGLOBIN A1C: Hgb A1c MFr Bld: 7.3 % — ABNORMAL HIGH (ref 4.6–6.5)

## 2015-05-22 MED ORDER — METHYLPHENIDATE HCL 5 MG PO TABS: 5.0000 mg | ORAL_TABLET | Freq: Two times a day (BID) | ORAL | Status: DC

## 2015-05-22 MED ORDER — TETANUS-DIPHTHERIA TOXOIDS TD 2-2 LF/0.5ML IM SUSP: 0.5000 mL | Freq: Once | INTRAMUSCULAR | Status: DC

## 2015-05-22 NOTE — Assessment & Plan Note (Signed)
Not great but not bad On methylphenidate and olanzapine as augmentation No wean indicated Needs to get up and out--not watch TV all the time

## 2015-05-22 NOTE — Assessment & Plan Note (Signed)
I have personally reviewed the Medicare Annual Wellness questionnaire and have noted 1. The patient's medical and social history 2. Their use of alcohol, tobacco or illicit drugs 3. Their current medications and supplements 4. The patient's functional ability including ADL's, fall risks, home safety risks and hearing or visual             impairment. 5. Diet and physical activities 6. Evidence for depression or mood disorders  The patients weight, height, BMI and visual acuity have been recorded in the chart I have made referrals, counseling and provided education to the patient based review of the above and I have provided the pt with a written personalized care plan for preventive services.  I have provided you with a copy of your personalized plan for preventive services. Please take the time to review along with your updated medication list.  Will send Rx for Td Flu vaccine today Discussed exercise Colonoscopy due next April--he should get No more PSA due to age

## 2015-05-22 NOTE — Progress Notes (Signed)
Subjective:    Patient ID: [name], male    DOB: [birth year], 74 y.o.   MRN: [id number]  HPI Here for Medicare wellness and follow up of chronic medical conditions Reviewed form and advanced directives Reviewed other doctors Wife with him as usual No tobacco or alcohol Drives very little--can do housework but usually doesn't help Does all his ADLs Not exercising No falls No apparent cognitive problems  Diarrhea is gone now Constipated now--may go 3-4 days without going Uses the cholestyramine 2-3 times per week No longer using imodium (except once on trip to beach) Having gas and some mild discomfort Gets some blood on his pad--but known hemorrhoids  Checking sugars rarely if ever No low sugar reactions Ongoing leg and foot numbness. Some toe pain RLS symptoms as well  Depression is "better" but ongoing symptoms Some degree of anhedonia Not exercising Sits in chair and watches [employer] all day long Not much anxiety though  No chest pain No palpitations Occasional mild dizziness if up for a while--no syncope No edema  Current Outpatient Prescriptions on File Prior to Visit  Medication Sig Dispense Refill  . acetaminophen (TYLENOL) 650 MG CR tablet Take 650 mg by mouth 3 (three) times daily.     Marland Kitchen ALPRAZolam (XANAX) 0.5 MG tablet TAKE 1 TABLET BY MOUTH 3 TIMES A DAY AS NEEDED 90 tablet 0  . atorvastatin (LIPITOR) 20 MG tablet TAKE 1 TABLET BY MOUTH ONCE A DAY 90 tablet 3  . cholestyramine light (PREVALITE) 4 G packet Take 1 packet (4 g total) by mouth 2 (two) times daily. 60 packet 11  . escitalopram (LEXAPRO) 20 MG tablet TAKE 2 TABLETS BY MOUTH EVERY MORNING 180 tablet 0  . glipiZIDE (GLUCOTROL) 5 MG tablet TAKE 1 TABLET BY MOUTH TWICE A DAY BEFORE A MEAL 60 tablet 11  . glucose blood test strip Dx: 250.40 100 each 6  . levothyroxine (SYNTHROID, LEVOTHROID) 75 MCG tablet TAKE 1 TABLET BY MOUTH ONCE A DAY 90 tablet 3  . lisinopril (PRINIVIL,ZESTRIL) 10  MG tablet TAKE 1 TABLET BY MOUTH ONCE A DAY 90 tablet 3  . metFORMIN (GLUCOPHAGE) 500 MG tablet Take 1 tablet (500 mg total) by mouth 3 (three) times daily. 270 tablet 3  . methylphenidate (RITALIN) 5 MG tablet Take 1 tablet (5 mg total) by mouth 2 (two) times daily. 60 tablet 0  . methylphenidate (RITALIN) 5 MG tablet Take 1 tablet (5 mg total) by mouth 2 (two) times daily. 60 tablet 0  . methylphenidate (RITALIN) 5 MG tablet Take 1 tablet (5 mg total) by mouth 2 (two) times daily. 60 tablet 0  . naproxen sodium (ANAPROX) 220 MG tablet Take 220-440 mg by mouth at bedtime as needed.    Marland Kitchen OLANZapine (ZYPREXA) 10 MG tablet TAKE 1 TABLET BY MOUTH EVERY NIGHT AT BEDTIME 30 tablet 11  . vitamin B-12 (CYANOCOBALAMIN) 1000 MCG tablet Take 1,000 mcg by mouth daily.       No current facility-administered medications on file prior to visit.    Allergies  Allergen Reactions  . Ezetimibe-Simvastatin     REACTION: nausea    Past Medical History  Diagnosis Date  . Anxiety   . Depression     recurrent and severe  . Diabetes mellitus     Type II with nephropathy  . HLD (hyperlipidemia)   . Osteoarthritis   . Hypothyroidism   . Personal history of colonic adenoma 12/03/2012    Past Surgical History  Procedure Laterality Date  . Umbilical hernia repair  2000  . 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 Clair Gulling ([facility])  . Total knee arthroplasty  6/10    Left-Dr. Clair Gulling ([facility])    Family History  Problem Relation Age of Onset  . Heart failure Father   . Diabetes Father   . Depression Father   . Coronary artery disease Father   . Breast cancer Sister   . Lung cancer Sister     ?   . Depression Sister   . Stroke      Paternal side  . Parkinsonism      Cousin  . Colon cancer Neg Hx   . Rectal cancer Neg Hx   . Esophageal cancer Neg Hx   . Stomach cancer Mother     Social History   Social History  . Marital Status: Married    Spouse Name: N/A  . Number of Children: 2  . Years  of Education: N/A   Occupational History  . Retired    Social History Main Topics  . Smoking status: Never Smoker   . Smokeless tobacco: Former Systems developer    Types: Chew  . Alcohol Use: No  . Drug Use: No  . Sexual Activity: Not on file   Other Topics Concern  . Not on file   Social History Narrative   Has living will.   No formal health care POA---requests wife to be   Would accept resuscitation attempts   No tube feeds if cognitively unaware   Review of Systems Sleeps well Appetite is good Weight stable Wears seat belt Partial upper denture--overdue for dentist Back pain is not bad. No focal leg weakness No skin lesions or ulcers    Objective:   Physical Exam  Constitutional: He is oriented to person, place, and time. He appears well-developed and well-nourished. No distress.  HENT:  Mouth/Throat: Oropharynx is clear and moist.  Full upper denture  Neck: Normal range of motion. Neck supple. No thyromegaly present.  Cardiovascular: Normal rate, regular rhythm, normal heart sounds and intact distal pulses.  Exam reveals no gallop.   No murmur heard. Pulmonary/Chest: Effort normal and breath sounds normal. No respiratory distress. He has no wheezes. He has no rales.  Abdominal: Soft. There is no tenderness.  Musculoskeletal: He exhibits no edema or tenderness.  Lymphadenopathy:    He has no cervical adenopathy.  Neurological: He is alert and oriented to person, place, and time.  President-- "[name], [place]" [phone] [id number] Recall 2/3  Decreased sensation in feet  Skin: No rash noted. No erythema.  No foot lesions  Psychiatric:  Melancholic but not overtly depressed Normal appearance and speech          Assessment & Plan:

## 2015-05-22 NOTE — Assessment & Plan Note (Signed)
Better now and some constipation Probably has to stop the cholestyramine

## 2015-05-22 NOTE — Progress Notes (Signed)
Pre visit review using our clinic review tool, if applicable. No additional management support is needed unless otherwise documented below in the visit note. 

## 2015-05-22 NOTE — Assessment & Plan Note (Signed)
See social history Blank forms given 

## 2015-05-22 NOTE — Assessment & Plan Note (Signed)
Doesn't check but hopefully still controlled Discussed exercise Mild pain in toes mostly--not enough for meds

## 2015-05-23 ENCOUNTER — Encounter: Payer: Self-pay | Admitting: Internal Medicine

## 2015-07-06 ENCOUNTER — Other Ambulatory Visit: Payer: Self-pay | Admitting: Internal Medicine

## 2015-07-06 NOTE — Telephone Encounter (Signed)
rx called into pharmacy

## 2015-07-06 NOTE — Telephone Encounter (Signed)
Approved: #90 x 0 

## 2015-07-06 NOTE — Telephone Encounter (Signed)
05/19/15 

## 2015-07-13 ENCOUNTER — Other Ambulatory Visit: Payer: Self-pay | Admitting: Internal Medicine

## 2015-08-17 ENCOUNTER — Other Ambulatory Visit: Payer: Self-pay | Admitting: Internal Medicine

## 2015-08-17 NOTE — Telephone Encounter (Signed)
07/06/2015 

## 2015-08-17 NOTE — Telephone Encounter (Signed)
rx called into pharmacy

## 2015-09-01 ENCOUNTER — Telehealth: Payer: Self-pay | Admitting: Internal Medicine

## 2015-09-01 NOTE — Telephone Encounter (Signed)
Patient Name: Randy Mcdaniel  DOB: [birth year]    Initial Comment Caller states he has a chest cold, constipation, right leg is going numb from his buttcheeks down to his foot.   Nurse Assessment  Nurse: Mallie Mussel, RN, Alveta Heimlich Date/Time Eilene Ghazi Time): 09/01/2015 8:27:59 AM  Confirm and document reason for call. If symptomatic, describe symptoms. ---Caller states that his entire right leg is numb from his butt check to his foot. He has had this for several weeks. He was seen by a doctor and he has vertebrae laying on one another. He has a chest cold and constipation. He has been coughing and wheezing. Not wheezing at this time. He has had difficulty breathing when he has coughing spells. Denies fever. His last BM was 2 days ago but he really had to strain to have it. Denies abdominal pain.  Has the patient traveled out of the country within the last 30 days? ---No  Does the patient have any new or worsening symptoms? ---Yes  Will a triage be completed? ---Yes  Related visit to physician within the last 2 weeks? ---No  Does the PT have any chronic conditions? (i.e. diabetes, asthma, etc.) ---Yes  List chronic conditions. ---Diabetes  Is this a behavioral health or substance abuse call? ---No     Guidelines    Guideline Title Affirmed Question Affirmed Notes  Common Cold Cold with no complications (all triage questions negative)    Final Disposition User   See Physician within [place], RN, [employer] states that he was told that Dr. Silvio Pate is out of the office this week. He only will see Dr. Silvio Pate. He wants an appointment made for next week. Advised him to call us if he feels he is getting worse. He verbalized understanding.  [facility] scheduled for 09/07/15 at 2:00pm with Dr. Viviana Simpler.   Referrals  REFERRED TO PCP OFFICE   Disagree/Comply: Disagree  Disagree/Comply Reason: Disagree with instructions

## 2015-09-01 NOTE — Telephone Encounter (Signed)
Pt has appt on 09/07/2015 at 2 pm with Dr Silvio Pate.

## 2015-09-07 ENCOUNTER — Ambulatory Visit: Payer: Self-pay | Admitting: Internal Medicine

## 2015-09-15 ENCOUNTER — Other Ambulatory Visit: Payer: Self-pay

## 2015-09-15 MED ORDER — METHYLPHENIDATE HCL 5 MG PO TABS: 5.0000 mg | ORAL_TABLET | Freq: Two times a day (BID) | ORAL | Status: DC

## 2015-09-15 MED ORDER — METHYLPHENIDATE HCL 5 MG PO TABS
5.0000 mg | ORAL_TABLET | Freq: Two times a day (BID) | ORAL | Status: DC
Start: 2015-09-15 — End: 2015-09-25

## 2015-09-15 NOTE — Telephone Encounter (Signed)
Pt left v/m requesting 3 rx for ritalin. Call when ready for pick up. Pt last seen and rx last printed # 60 x 2 on 05/22/15.

## 2015-09-15 NOTE — Telephone Encounter (Signed)
Spoke with patient and advised rx ready for pick-up and it will be at the front desk.  

## 2015-09-23 ENCOUNTER — Ambulatory Visit (INDEPENDENT_AMBULATORY_CARE_PROVIDER_SITE_OTHER): Payer: Medicare Other | Admitting: Internal Medicine

## 2015-09-23 ENCOUNTER — Encounter: Payer: Self-pay | Admitting: Internal Medicine

## 2015-09-23 ENCOUNTER — Other Ambulatory Visit: Payer: Self-pay | Admitting: Internal Medicine

## 2015-09-23 VITALS — BP 140/70 | HR 110 | Temp 98.1°F | Wt 207.0 lb

## 2015-09-23 DIAGNOSIS — R1084 Generalized abdominal pain: Secondary | ICD-10-CM

## 2015-09-23 LAB — CBC WITH DIFFERENTIAL/PLATELET
BASOS PCT: 0.6 % (ref 0.0–3.0)
Basophils Absolute: 0.1 10*3/uL (ref 0.0–0.1)
EOS ABS: 0.2 10*3/uL (ref 0.0–0.7)
EOS PCT: 2.5 % (ref 0.0–5.0)
HEMATOCRIT: 41.9 % (ref 39.0–52.0)
HEMOGLOBIN: 13.8 g/dL (ref 13.0–17.0)
LYMPHS PCT: 17.7 % (ref 12.0–46.0)
Lymphs Abs: 1.6 10*3/uL (ref 0.7–4.0)
MCHC: 32.9 g/dL (ref 30.0–36.0)
MCV: 87.3 fl (ref 78.0–100.0)
Monocytes Absolute: 0.5 10*3/uL (ref 0.1–1.0)
Monocytes Relative: 5.6 % (ref 3.0–12.0)
Neutro Abs: 6.6 10*3/uL (ref 1.4–7.7)
Neutrophils Relative %: 73.6 % (ref 43.0–77.0)
Platelets: 296 10*3/uL (ref 150.0–400.0)
RBC: 4.8 Mil/uL (ref 4.22–5.81)
RDW: 14.4 % (ref 11.5–15.5)
WBC: 9 10*3/uL (ref 4.0–10.5)

## 2015-09-23 LAB — COMPREHENSIVE METABOLIC PANEL
ALBUMIN: 4.2 g/dL (ref 3.5–5.2)
ALT: 13 U/L (ref 0–53)
AST: 21 U/L (ref 0–37)
Alkaline Phosphatase: 64 U/L (ref 39–117)
BUN: 21 mg/dL (ref 6–23)
CALCIUM: 9.6 mg/dL (ref 8.4–10.5)
CHLORIDE: 102 meq/L (ref 96–112)
CO2: 25 mEq/L (ref 19–32)
Creatinine, Ser: 1.2 mg/dL (ref 0.40–1.50)
GFR: 62.59 mL/min (ref >60.00)
Glucose, Bld: 206 mg/dL — ABNORMAL HIGH (ref 70–99)
POTASSIUM: 4.3 meq/L (ref 3.5–5.1)
Sodium: 136 mEq/L (ref 135–145)
Total Bilirubin: 0.5 mg/dL (ref 0.2–1.2)
Total Protein: 7.2 g/dL (ref 6.0–8.3)

## 2015-09-23 MED ORDER — OLANZAPINE 10 MG PO TABS: 10.0000 mg | ORAL_TABLET | Freq: Every day | ORAL | Status: DC

## 2015-09-23 NOTE — Telephone Encounter (Signed)
Received faxed refill request and last prescribed on 08/17/2015.

## 2015-09-23 NOTE — Assessment & Plan Note (Signed)
Doesn't seem severe but going on for months, associated with decreased appetite and weight loss May be related to current constipation---formerly had diarrhea but now off the cholestyramine Known multiple polyps--last colon 3 years ago Will try miralax Check labs GI eval

## 2015-09-23 NOTE — Progress Notes (Signed)
Subjective:    Patient ID: Randy Land., male    DOB: [birth year], 75 y.o.   MRN: [id number]  HPI Here with wife due to abdominal pain Having a lot of gas Fecal urgency---but often no success Stomach is "rolling" Doesn't want to leave the house due to this  Goes back 2.5 months Ongoing constipation now--did stop the cholestyramine since no longer with diarrhea Tried benefiber--didn't help  No nausea or vomiting Appetite is off Has lost some weight  Current Outpatient Prescriptions on File Prior to Visit  Medication Sig Dispense Refill  . acetaminophen (TYLENOL) 650 MG CR tablet Take 650 mg by mouth 3 (three) times daily.     Marland Kitchen ALPRAZolam (XANAX) 0.5 MG tablet TAKE 1 TABLET BY MOUTH 3 TIMES A DAY AS NEEDED 90 tablet 0  . atorvastatin (LIPITOR) 20 MG tablet TAKE 1 TABLET BY MOUTH ONCE A DAY 90 tablet 3  . cholestyramine light (PREVALITE) 4 G packet Take 1 packet (4 g total) by mouth 2 (two) times daily. 60 packet 11  . escitalopram (LEXAPRO) 20 MG tablet TAKE 2 TABLETS BY MOUTH EVERY MORNING 180 tablet 0  . glipiZIDE (GLUCOTROL) 5 MG tablet TAKE 1 TABLET BY MOUTH TWICE A DAY BEFORE A MEAL 60 tablet 11  . glucose blood test strip Dx: 250.40 100 each 6  . levothyroxine (SYNTHROID, LEVOTHROID) 75 MCG tablet TAKE 1 TABLET BY MOUTH ONCE A DAY 90 tablet 3  . lisinopril (PRINIVIL,ZESTRIL) 10 MG tablet TAKE 1 TABLET BY MOUTH ONCE A DAY 90 tablet 3  . metFORMIN (GLUCOPHAGE) 500 MG tablet Take 1 tablet (500 mg total) by mouth 3 (three) times daily. 270 tablet 3  . methylphenidate (RITALIN) 5 MG tablet Take 1 tablet (5 mg total) by mouth 2 (two) times daily. 60 tablet 0  . methylphenidate (RITALIN) 5 MG tablet Take 1 tablet (5 mg total) by mouth 2 (two) times daily. 60 tablet 0  . methylphenidate (RITALIN) 5 MG tablet Take 1 tablet (5 mg total) by mouth 2 (two) times daily. 60 tablet 0  . naproxen sodium (ANAPROX) 220 MG tablet Take 220-440 mg by mouth at bedtime as needed.    Marland Kitchen  OLANZapine (ZYPREXA) 10 MG tablet TAKE 1 TABLET BY MOUTH DAILY AT BEDTIME 30 tablet 0  . vitamin B-12 (CYANOCOBALAMIN) 1000 MCG tablet Take 1,000 mcg by mouth daily.       No current facility-administered medications on file prior to visit.    Allergies  Allergen Reactions  . Ezetimibe-Simvastatin     REACTION: nausea    Past Medical History  Diagnosis Date  . Anxiety   . Depression     recurrent and severe  . Diabetes mellitus     Type II with nephropathy  . HLD (hyperlipidemia)   . Osteoarthritis   . Hypothyroidism   . Personal history of colonic adenoma 12/03/2012    Past Surgical History  Procedure Laterality Date  . Umbilical hernia repair  2000  . 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 Clair Gulling ([facility])  . Total knee arthroplasty  6/10    Left-Dr. Clair Gulling ([facility])    Family History  Problem Relation Age of Onset  . Heart failure Father   . Diabetes Father   . Depression Father   . Coronary artery disease Father   . Breast cancer Sister   . Lung cancer Sister     ?   . Depression Sister   . Stroke  Paternal side  . Parkinsonism      Cousin  . Colon cancer Neg Hx   . Rectal cancer Neg Hx   . Esophageal cancer Neg Hx   . Stomach cancer Mother     Social History   Social History  . Marital Status: Married    Spouse Name: N/A  . Number of Children: 2  . Years of Education: N/A   Occupational History  . Retired    Social History Main Topics  . Smoking status: Never Smoker   . Smokeless tobacco: Former Systems developer    Types: Chew  . Alcohol Use: No  . Drug Use: No  . Sexual Activity: Not on file   Other Topics Concern  . Not on file   Social History Narrative   Has living will.   No formal health care POA---requests wife to be   Would accept resuscitation attempts   No tube feeds if cognitively unaware   Review of Systems No SOB No fever Sugars are okay he thinks--but not checking Leg numbness is getting worse---recent fall going to  bathroom at night    Objective:   Physical Exam  Constitutional: He appears well-developed and well-nourished. No distress.  Pulmonary/Chest: Effort normal and breath sounds normal. No respiratory distress. He has no wheezes. He has no rales.  Abdominal: Soft. Bowel sounds are normal. He exhibits no distension. There is no rebound and no guarding.  Slight generalized sensitivity but no clear tenderness          Assessment & Plan:

## 2015-09-23 NOTE — Progress Notes (Signed)
Pre visit review using our clinic review tool, if applicable. No additional management support is needed unless otherwise documented below in the visit note. 

## 2015-09-23 NOTE — Telephone Encounter (Signed)
Approved: okay to fill for a year 

## 2015-09-23 NOTE — Patient Instructions (Signed)
Please start miralax-- 1 capful daily in a full glass of water.

## 2015-09-25 ENCOUNTER — Ambulatory Visit (INDEPENDENT_AMBULATORY_CARE_PROVIDER_SITE_OTHER): Payer: Medicare Other | Admitting: Internal Medicine

## 2015-09-25 ENCOUNTER — Encounter: Payer: Self-pay | Admitting: Internal Medicine

## 2015-09-25 VITALS — BP 128/76 | HR 100 | Ht 68.5 in | Wt 209.0 lb

## 2015-09-25 DIAGNOSIS — K5909 Other constipation: Secondary | ICD-10-CM | POA: Diagnosis not present

## 2015-09-25 DIAGNOSIS — R194 Change in bowel habit: Secondary | ICD-10-CM

## 2015-09-25 DIAGNOSIS — Z86010 Personal history of colonic polyps: Secondary | ICD-10-CM

## 2015-09-25 NOTE — Patient Instructions (Addendum)
Dr. Carlean Purl recommends that you complete a bowel purge (to clean out your bowels). Please do the following: Purchase a bottle of Miralax over the counter as well as a box of 5 mg dulcolax tablets. Take 4 dulcolax tablets. Wait 1 hour. You will then drink 6-8 capfuls of Miralax mixed in an adequate amount of water/juice/gatorade (you may choose which of these liquids to drink) over the next 2-3 hours. You should expect results within 1 to 6 hours after completing the bowel purge.   You have been scheduled for a colonoscopy. Please follow written instructions given to you at your visit today.  Please pick up your over the counter prep supplies at the pharmacy. If you use inhalers (even only as needed), please bring them with you on the day of your procedure.   I appreciate the opportunity to care for you. Silvano Rusk, MD, [facility]

## 2015-09-25 NOTE — Progress Notes (Signed)
   Subjective:    Patient ID: Randy Land., male    DOB: [birth year], 75 y.o.   MRN: [id number]  CC: Constipation, change in bowel habits HPI Patient is a 75 yo male who presents with 3.5 months of constipation. He states that prior to this he was having diarrhea and saw Dr. Silvio Pate for treatment. However, he has multiple times of day where he tries to have a BM without success. A BM looks like pellets, but he denies blood in the stool or dark, tarry stool. He has lower abdominal pain that feels like twisting multiple times per day. His wife states he has lost weight and is not eating as much as he normally does. He states he just feels full sooner than normal. Patient also states he has blood on the tissue paper and also has fecal incontinence. He has more gas than normal. He saw Dr. Silvio Pate for constipation and started him on Miralax - the first dose was yesterday and has not had any effect yet.  Medications, allergies, past medical history, past surgical history, family history and social history are reviewed and updated in the EMR.   Review of Systems  Constitutional: Positive for appetite change and unexpected weight change.  Gastrointestinal: Positive for abdominal pain, constipation and anal bleeding. Negative for nausea, vomiting, diarrhea and blood in stool.  All other systems reviewed and are negative.      Objective:   Physical Exam Filed Vitals:   09/25/15 1028  Height: 5' 8.5" (1.74 m)  Weight: 209 lb (94.802 kg)   Constitutional: Appears well-developed and well-nourished, elderly.  HENT:  Head: Normocephalic and atraumatic.  Eyes: No scleral icterus.  Neck: No tracheal deviation present.  Cardiovascular: Normal rate, regular rhythm and normal heart sounds.  No murmurs, rubs, or gallops Abdominal: Soft. Bowel sounds are normal. Patient exhibits no distension and no mass. There is no rebound and no guarding. Mild tenderness in the lower abdomen Rectal exam: Normal  rectal tone, no mass, there is formed firm brown stool filling the rectal vault  Neurological: Patient is alert and oriented to person, place, and time.  Skin: Skin is warm and dry.  Psychiatric: Patient has a normal mood and affect. The behavior is normal. Judgment and thought content normal.   Lab Results  Component Value Date   WBC 9.0 09/23/2015   HGB 13.8 09/23/2015   HCT 41.9 09/23/2015   MCV 87.3 09/23/2015   PLT 296.0 09/23/2015   Lab Results  Component Value Date   TSH 4.42 11/18/2014        Assessment & Plan:  Change in bowel habits  Other constipation  Hx of adenomatous colonic polyps  Due to his change in bowel habits and history of 4 adenomatous colonic polyps during his 2014 colonoscopy, he will be set up for a colonoscopy. The risks and benefits were explained and he is agreeable to continue. Functional bowel disturbance likely but need to exclude colorectal neoplasia.  Bowel purge with 4 dulcolax 5mg  tabs and 6-8 capsules of miralax. He will continue to take 1 cap of miralax per day and titrate to effect.   I have seen the patient with Foye Spurling PA student who served as a Education administrator.  I appreciate the opportunity to care for this patient. CC: Viviana Simpler, MD

## 2015-10-07 ENCOUNTER — Other Ambulatory Visit: Payer: Self-pay | Admitting: Internal Medicine

## 2015-10-07 NOTE — Telephone Encounter (Signed)
rx called into pharmacy

## 2015-10-07 NOTE — Telephone Encounter (Signed)
Approved: #90 x 0 

## 2015-10-07 NOTE — Telephone Encounter (Signed)
08/17/2015 

## 2015-10-08 ENCOUNTER — Telehealth: Payer: Self-pay | Admitting: Internal Medicine

## 2015-10-08 NOTE — Telephone Encounter (Signed)
Patient called back and would like to reschedule for 10/20/15.  I have mailed him new instructions.

## 2015-10-08 NOTE — Telephone Encounter (Signed)
Patient advised to increase Miralax to BID and that could move his procedure up to 10/20/15.  He needs to speak with his wife and will call me back to discuss moving up procedure.

## 2015-10-15 ENCOUNTER — Other Ambulatory Visit: Payer: Self-pay | Admitting: *Deleted

## 2015-10-15 MED ORDER — GLIPIZIDE 5 MG PO TABS: 5.0000 mg | ORAL_TABLET | Freq: Two times a day (BID) | ORAL | Status: DC

## 2015-10-20 ENCOUNTER — Ambulatory Visit (AMBULATORY_SURGERY_CENTER): Payer: Medicare Other | Admitting: Internal Medicine

## 2015-10-20 ENCOUNTER — Encounter: Payer: Self-pay | Admitting: Internal Medicine

## 2015-10-20 ENCOUNTER — Other Ambulatory Visit: Payer: Self-pay | Admitting: Internal Medicine

## 2015-10-20 VITALS — BP 115/65 | HR 91 | Temp 99.1°F | Resp 33 | Ht 68.0 in | Wt 209.0 lb

## 2015-10-20 DIAGNOSIS — K648 Other hemorrhoids: Secondary | ICD-10-CM | POA: Diagnosis not present

## 2015-10-20 DIAGNOSIS — Z86010 Personal history of colonic polyps: Secondary | ICD-10-CM | POA: Diagnosis not present

## 2015-10-20 DIAGNOSIS — R194 Change in bowel habit: Secondary | ICD-10-CM

## 2015-10-20 DIAGNOSIS — K642 Third degree hemorrhoids: Secondary | ICD-10-CM | POA: Insufficient documentation

## 2015-10-20 DIAGNOSIS — K5730 Diverticulosis of large intestine without perforation or abscess without bleeding: Secondary | ICD-10-CM | POA: Diagnosis not present

## 2015-10-20 MED ORDER — SODIUM CHLORIDE 0.9 % IV SOLN: 500.0000 mL | INTRAVENOUS | Status: DC

## 2015-10-20 NOTE — Op Note (Signed)
[place]  [address] [place], [address]   COLONOSCOPY PROCEDURE REPORT  PATIENT: Randy Mcdaniel, Randy Mcdaniel  MR#: [id number] BIRTHDATE: [birth year] , 35  yrs. old GENDER: male ENDOSCOPIST: Gatha Mayer, MD, [facility] PROCEDURE DATE:  10/20/2015 PROCEDURE:   Colonoscopy, diagnostic First Screening Colonoscopy - Avg.  risk and is 50 yrs.  old or older - No.  Prior Negative Screening - Now for repeat screening. N/A  History of Adenoma - Now for follow-up colonoscopy & has been > or = to 3 yrs.  No.  It has been less than 3 yrs since last colonoscopy.  Medical reason.  Polyps removed today? No Recommend repeat exam, <10 yrs? No ASA CLASS:   Class III INDICATIONS:Patient is not applicable for Colorectal Neoplasm Risk Assessment for this procedure. MEDICATIONS: Propofol 100 mg IV and Monitored anesthesia care  DESCRIPTION OF PROCEDURE:   After the risks benefits and alternatives of the procedure were thoroughly explained, informed consent was obtained.  The digital rectal exam revealed hemorrhoids, Grade III.   The LB [id number]  endoscope was introduced through the anus and advanced to the cecum, which was identified by both the appendix and ileocecal valve. No adverse events experienced.   The quality of the prep was good.  (MiraLax was used)  The instrument was then slowly withdrawn as the colon was fully examined. Estimated blood loss is zero unless otherwise noted in this procedure report.      COLON FINDINGS: There was diverticulosis noted in the left colon. Internal Grade III hemorrhoids were found.   The examination was otherwise normal.  Retroflexed views revealed no abnormalities. The time to cecum = 5.4 Withdrawal time = 6.3   The scope was withdrawn and the procedure completed. COMPLICATIONS: There were no immediate complications.  ENDOSCOPIC IMPRESSION: 1.   Diverticulosis was noted in the left colon 2.   Internal Grade III hemorrhoids 3.   The  examination was otherwise normal - good prep  RECOMMENDATIONS: 1.  Routine repeat colonoscopy screening not necessary.  See me/GI as needed. 2.  I think hemorrhoid banding could help his incontinence - will offer hemorrhoid banding. 3.  Continue MiraLax for constipatiopn - titrate for effect  eSigned:  Gatha Mayer, MD, [facility] 10/20/2015 2:40 PM   cc: The Patient Dr. Viviana Simpler

## 2015-10-20 NOTE — Patient Instructions (Signed)
YOU HAD AN ENDOSCOPIC PROCEDURE TODAY AT [facility]:   Refer to the procedure report that was given to you for any specific questions about what was found during the examination.  If the procedure report does not answer your questions, please call your gastroenterologist to clarify.  If you requested that your care partner not be given the details of your procedure findings, then the procedure report has been included in a sealed envelope for you to review at your convenience later.  YOU SHOULD EXPECT: Some feelings of bloating in the abdomen. Passage of more gas than usual.  Walking can help get rid of the air that was put into your GI tract during the procedure and reduce the bloating. If you had a lower endoscopy (such as a colonoscopy or flexible sigmoidoscopy) you may notice spotting of blood in your stool or on the toilet paper. If you underwent a bowel prep for your procedure, you may not have a normal bowel movement for a few days.  Please Note:  You might notice some irritation and congestion in your nose or some drainage.  This is from the oxygen used during your procedure.  There is no need for concern and it should clear up in a day or so.  SYMPTOMS TO REPORT IMMEDIATELY:   Following lower endoscopy (colonoscopy or flexible sigmoidoscopy):  Excessive amounts of blood in the stool  Significant tenderness or worsening of abdominal pains  Swelling of the abdomen that is new, acute  Fever of 100F or higher   For urgent or emergent issues, a gastroenterologist can be reached at any hour by calling [phone].   DIET: Your first meal following the procedure should be a small meal and then it is ok to progress to your normal diet. Heavy or fried foods are harder to digest and may make you feel nauseous or bloated.  Likewise, meals heavy in dairy and vegetables can increase bloating.  Drink plenty of fluids but you should avoid alcoholic beverages for 24  hours.  ACTIVITY:  You should plan to take it easy for the rest of today and you should NOT DRIVE or use heavy machinery until tomorrow (because of the sedation medicines used during the test).    FOLLOW UP: Our staff will call the number listed on your records the next business day following your procedure to check on you and address any questions or concerns that you may have regarding the information given to you following your procedure. If we do not reach you, we will leave a message.  However, if you are feeling well and you are not experiencing any problems, there is no need to return our call.  We will assume that you have returned to your regular daily activities without incident.  If any biopsies were taken you will be contacted by phone or by letter within the next 1-3 weeks.  Please call us at [phone] if you have not heard about the biopsies in 3 weeks.    SIGNATURES/CONFIDENTIALITY: You and/or your care partner have signed paperwork which will be entered into your electronic medical record.  These signatures attest to the fact that that the information above on your After Visit Summary has been reviewed and is understood.  Full responsibility of the confidentiality of this discharge information lies with you and/or your care-partner.  Diverticulosis, hemorrhoid banding  information given.  Continue Miralax for constipation, titrate for effect.

## 2015-10-20 NOTE — Progress Notes (Signed)
No egg or soy allergy known to patient  No issues with past sedation with any surgeries  or procedures, no intubation problems  No diet pills per patient No home 02 use per patient  No blood thinners per patient    

## 2015-10-20 NOTE — Progress Notes (Signed)
Report to PACU, RN, vss, BBS= Clear.  

## 2015-10-21 ENCOUNTER — Telehealth: Payer: Self-pay

## 2015-10-21 NOTE — Telephone Encounter (Signed)
  Follow up Call-  Call back number 10/20/2015  Post procedure Call Back phone  # [phone]  Permission to leave phone message Yes     Patient questions:  Do you have a fever, pain , or abdominal swelling? No. Pain Score  0 *  Have you tolerated food without any problems? Yes.    Have you been able to return to your normal activities? Yes.    Do you have any questions about your discharge instructions: Diet   No. Medications  No. Follow up visit  No.  Do you have questions or concerns about your Care? No.  Actions: * If pain score is 4 or above: No action needed, pain <4.

## 2015-10-29 ENCOUNTER — Encounter: Payer: Medicare Other | Admitting: Internal Medicine

## 2015-11-16 ENCOUNTER — Other Ambulatory Visit: Payer: Self-pay | Admitting: Internal Medicine

## 2015-11-16 NOTE — Telephone Encounter (Signed)
Alprazolam: Last RX 10-07-15 #90/0 Last OV 09-23-15 Next OV 11-20-15

## 2015-11-16 NOTE — Telephone Encounter (Signed)
Approved: okay alprazolam #90 x 0 escitalopram for a year

## 2015-11-16 NOTE — Telephone Encounter (Signed)
Alprazolam Left refill on voice mail at pharmacy escitalopram sent by escript

## 2015-11-20 ENCOUNTER — Ambulatory Visit (INDEPENDENT_AMBULATORY_CARE_PROVIDER_SITE_OTHER): Payer: Medicare Other | Admitting: Internal Medicine

## 2015-11-20 ENCOUNTER — Encounter: Payer: Self-pay | Admitting: Internal Medicine

## 2015-11-20 VITALS — BP 140/80 | HR 94 | Temp 98.9°F | Wt 207.0 lb

## 2015-11-20 DIAGNOSIS — M48062 Spinal stenosis, lumbar region with neurogenic claudication: Secondary | ICD-10-CM

## 2015-11-20 DIAGNOSIS — M4806 Spinal stenosis, lumbar region: Secondary | ICD-10-CM

## 2015-11-20 DIAGNOSIS — E039 Hypothyroidism, unspecified: Secondary | ICD-10-CM

## 2015-11-20 DIAGNOSIS — E1149 Type 2 diabetes mellitus with other diabetic neurological complication: Secondary | ICD-10-CM

## 2015-11-20 DIAGNOSIS — F3341 Major depressive disorder, recurrent, in partial remission: Secondary | ICD-10-CM | POA: Diagnosis not present

## 2015-11-20 LAB — T4, FREE: FREE T4: 0.8 ng/dL (ref 0.60–1.60)

## 2015-11-20 LAB — LIPID PANEL
CHOL/HDL RATIO: 3
Cholesterol: 150 mg/dL (ref 0–200)
HDL: 47.9 mg/dL (ref >39.00)
LDL Cholesterol: 69 mg/dL (ref 0–99)
NONHDL: 101.93
Triglycerides: 167 mg/dL — ABNORMAL HIGH (ref 0.0–149.0)
VLDL: 33.4 mg/dL (ref 0.0–40.0)

## 2015-11-20 LAB — HEMOGLOBIN A1C: HEMOGLOBIN A1C: 7.6 % — AB (ref 4.6–6.5)

## 2015-11-20 LAB — TSH: TSH: 4.58 u[IU]/mL — AB (ref 0.35–4.50)

## 2015-11-20 LAB — HM DIABETES FOOT EXAM

## 2015-11-20 MED ORDER — METHYLPHENIDATE HCL 5 MG PO TABS: 5.0000 mg | ORAL_TABLET | Freq: Two times a day (BID) | ORAL | Status: DC

## 2015-11-20 MED ORDER — GABAPENTIN 300 MG PO CAPS: 300.0000 mg | ORAL_CAPSULE | Freq: Every day | ORAL | Status: DC

## 2015-11-20 MED ORDER — METHYLPHENIDATE HCL 5 MG PO TABS
5.0000 mg | ORAL_TABLET | Freq: Two times a day (BID) | ORAL | Status: DC
Start: 2015-11-20 — End: 2015-11-20

## 2015-11-20 MED ORDER — TETANUS-DIPHTHERIA TOXOIDS TD 5-2 LFU IM INJ: 0.5000 mL | INJECTION | Freq: Once | INTRAMUSCULAR | Status: DC

## 2015-11-20 NOTE — Progress Notes (Signed)
Pre visit review using our clinic review tool, if applicable. No additional management support is needed unless otherwise documented below in the visit note. 

## 2015-11-20 NOTE — Assessment & Plan Note (Signed)
Hopefully still good control Will start gabapentin at bedtime for increased neuropathy pain

## 2015-11-20 NOTE — Progress Notes (Signed)
Subjective:    Patient ID: Randy Mcdaniel., male    DOB: [birth year], 75 y.o.   MRN: [id number]  HPI Here with wife for follow up of diabetes and other chronic health conditions  Still having stomach trouble Gets gas with "anything I eat" Now more constipated Did have reassuring colonoscopy Due for banding of hemorrhoids next week  Checks sugars rarely--only at colonoscopy No hypoglycemic reactions Feet are numb and some pain--taking one of wife's tramadol at night at times (discussed concern for serotonin syndrome though rare) Eye exam coming up  Mood is "up and down" Feels good if he gets up, showers, etc Sometimes he just sits in chair--those are down days No suicidal thoughts or severe depression  No chest pain No SOB Tries to walk in house--discussed going longer distance No dizziness or syncope lately No regular edema  Current Outpatient Prescriptions on File Prior to Visit  Medication Sig Dispense Refill  . acetaminophen (TYLENOL) 650 MG CR tablet Take 650 mg by mouth 3 (three) times daily.     Marland Kitchen ALPRAZolam (XANAX) 0.5 MG tablet TAKE 1 TABLET BY MOUTH 3 TIMES A DAY AS NEEDED 90 tablet 0  . atorvastatin (LIPITOR) 20 MG tablet TAKE 1 TABLET BY MOUTH ONCE A DAY 90 tablet 3  . escitalopram (LEXAPRO) 20 MG tablet TAKE 2 TABLETS BY MOUTH EVERY MORNING 180 tablet 3  . glipiZIDE (GLUCOTROL) 5 MG tablet Take 1 tablet (5 mg total) by mouth 2 (two) times daily before a meal. 180 tablet 3  . glucose blood test strip Dx: 250.40 100 each 6  . levothyroxine (SYNTHROID, LEVOTHROID) 75 MCG tablet TAKE 1 TABLET BY MOUTH ONCE A DAY 90 tablet 3  . lisinopril (PRINIVIL,ZESTRIL) 10 MG tablet TAKE 1 TABLET BY MOUTH ONCE A DAY 90 tablet 3  . metFORMIN (GLUCOPHAGE) 500 MG tablet Take 1 tablet (500 mg total) by mouth 3 (three) times daily. 270 tablet 3  . methylphenidate (RITALIN) 5 MG tablet Take 1 tablet (5 mg total) by mouth 2 (two) times daily. 60 tablet 0  . naproxen sodium  (ANAPROX) 220 MG tablet Take 220-440 mg by mouth at bedtime as needed.    Marland Kitchen OLANZapine (ZYPREXA) 10 MG tablet TAKE 1 TABLET BY MOUTH AT BEDTIME 30 tablet 11  . vitamin B-12 (CYANOCOBALAMIN) 1000 MCG tablet Take 1,000 mcg by mouth daily.       No current facility-administered medications on file prior to visit.    Allergies  Allergen Reactions  . Ezetimibe-Simvastatin     REACTION: nausea    Past Medical History  Diagnosis Date  . Anxiety   . Depression     recurrent and severe  . Diabetes mellitus     Type II with nephropathy  . HLD (hyperlipidemia)   . Osteoarthritis   . Hypothyroidism   . Personal history of colonic adenoma 12/03/2012    Past Surgical History  Procedure Laterality Date  . Umbilical hernia repair  2000  . 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 Clair Gulling ([facility])  . Total knee arthroplasty  6/10    Left-Dr. Clair Gulling ([facility])  . Colonoscopy      Family History  Problem Relation Age of Onset  . Heart failure Father   . Diabetes Father   . Depression Father   . Coronary artery disease Father   . Breast cancer Sister   . Lung cancer Sister     ?   . Depression Sister   .  Stroke      Paternal side  . Parkinsonism      Cousin  . Colon cancer Neg Hx   . Rectal cancer Neg Hx   . Esophageal cancer Neg Hx   . Colon polyps Neg Hx   . Stomach cancer Mother     Social History   Social History  . Marital Status: Married    Spouse Name: N/A  . Number of Children: 2  . Years of Education: N/A   Occupational History  . Retired    Social History Main Topics  . Smoking status: Never Smoker   . Smokeless tobacco: Former Systems developer    Types: Chew  . Alcohol Use: No  . Drug Use: No  . Sexual Activity: Not on file   Other Topics Concern  . Not on file   Social History Narrative   Has living will.   No formal health care POA---requests wife to be   Would accept resuscitation attempts   No tube feeds if cognitively unaware   Review of Systems Sleeps  well Appetite is fine Weight stable    Objective:   Physical Exam  Constitutional: He appears well-developed and well-nourished. No distress.  Neck: Normal range of motion. Neck supple.  Cardiovascular: Normal rate, regular rhythm, normal heart sounds and intact distal pulses.  Exam reveals no gallop.   No murmur heard. Pulmonary/Chest: Effort normal and breath sounds normal. No respiratory distress. He has no wheezes. He has no rales.  Musculoskeletal: He exhibits no edema.  Lymphadenopathy:    He has no cervical adenopathy.  Neurological:  Decreased sensation in feet  Skin:  No foot ulcers but purplish discoloration Mycotic toenails Normal pulses  Psychiatric: He has a normal mood and affect. His behavior is normal.          Assessment & Plan:

## 2015-11-20 NOTE — Assessment & Plan Note (Signed)
Seems to be euthyroid 

## 2015-11-20 NOTE — Assessment & Plan Note (Signed)
No recent problems with back pain

## 2015-11-20 NOTE — Assessment & Plan Note (Signed)
Seems to be reasonably controlled Has been able to decrease alprazolam to bid

## 2015-11-23 ENCOUNTER — Encounter: Payer: Self-pay | Admitting: Internal Medicine

## 2015-11-23 NOTE — Telephone Encounter (Signed)
Please call the pharmacist and see which Td vaccine they have---and order that for him

## 2015-11-23 NOTE — Telephone Encounter (Signed)
Spoke to [place] at [facility]. Advised her it did not matter which Tetanus vaccine he got. They really did not need a RX. Sent patient another message on MyChart

## 2015-11-25 ENCOUNTER — Encounter: Payer: Self-pay | Admitting: Internal Medicine

## 2015-11-25 ENCOUNTER — Ambulatory Visit (INDEPENDENT_AMBULATORY_CARE_PROVIDER_SITE_OTHER): Payer: Medicare Other | Admitting: Internal Medicine

## 2015-11-25 VITALS — BP 126/76 | HR 76 | Ht 68.5 in | Wt 208.5 lb

## 2015-11-25 DIAGNOSIS — K642 Third degree hemorrhoids: Secondary | ICD-10-CM

## 2015-11-25 DIAGNOSIS — K648 Other hemorrhoids: Secondary | ICD-10-CM

## 2015-11-25 NOTE — Patient Instructions (Addendum)
HEMORRHOID BANDING PROCEDURE    FOLLOW-UP CARE   1. The procedure you have had should have been relatively painless since the banding of the area involved does not have nerve endings and there is no pain sensation.  The rubber band cuts off the blood supply to the hemorrhoid and the band may fall off as soon as 48 hours after the banding (the band may occasionally be seen in the toilet bowl following a bowel movement). You may notice a temporary feeling of fullness in the rectum which should respond adequately to plain Tylenol or Motrin.  2. Following the banding, avoid strenuous exercise that evening and resume full activity the next day.  A sitz bath (soaking in a warm tub) or bidet is soothing, and can be useful for cleansing the area after bowel movements.     3. To avoid constipation, take two tablespoons of natural wheat bran, natural oat bran, flax, Benefiber or any over the counter fiber supplement and increase your water intake to 7-8 glasses daily.    4. Unless you have been prescribed anorectal medication, do not put anything inside your rectum for two weeks: No suppositories, enemas, fingers, etc.  5. Occasionally, you may have more bleeding than usual after the banding procedure.  This is often from the untreated hemorrhoids rather than the treated one.  Don't be concerned if there is a tablespoon or so of blood.  If there is more blood than this, lie flat with your bottom higher than your head and apply an ice pack to the area. If the bleeding does not stop within a half an hour or if you feel faint, call our office at [phone] or go to the emergency room.  6. Problems are not common; however, if there is a substantial amount of bleeding, severe pain, chills, fever or difficulty passing urine (very rare) or other problems, you should call us at [phone] or report to the nearest emergency room.  7. Do not stay seated continuously for more than 2-3 hours for a day or two  after the procedure.  Tighten your buttock muscles 10-15 times every two hours and take 10-15 deep breaths every 1-2 hours.  Do not spend more than a few minutes on the toilet if you cannot empty your bowel; instead re-visit the toilet at a later time.    Please follow up with [place] for your next banding on 01/04/2016 at 3:15pm.    I appreciate the opportunity to care for you.

## 2015-11-25 NOTE — Progress Notes (Signed)
    Anoscopy was performed with the patient in the left lateral decubitus position while a chaperone was present and revealed Gr 2 RP aqnd LL internal hemorrhoids and Grade 1-2 RA internal hemorrhoid  PROCEDURE NOTE: The patient presents with symptomatic grade 2-3  hemorrhoids, requesting rubber band ligation of his/her hemorrhoidal disease.  All risks, benefits and alternative forms of therapy were described and informed consent was obtained.   The anorectum was pre-medicated with 0.125% NTG and 5% lidocaine The decision was made to band the RP and LL internal hemorrhoids, and the [place] was used to perform band ligation without complication.  Digital anorectal examination was then performed to assure proper positioning of the band, and to adjust the banded tissue as required.  The patient was discharged home without pain or other issues.  Dietary and behavioral recommendations were given and along with follow-up instructions.      The patient will return in a few weeks for  follow-up and possible additional banding as required. No complications were encountered and the patient tolerated the procedure well.

## 2015-11-25 NOTE — Assessment & Plan Note (Signed)
RP and LL banded 

## 2015-12-02 ENCOUNTER — Other Ambulatory Visit: Payer: Self-pay | Admitting: Internal Medicine

## 2015-12-13 ENCOUNTER — Encounter: Payer: Self-pay | Admitting: Internal Medicine

## 2016-01-04 ENCOUNTER — Other Ambulatory Visit: Payer: Self-pay | Admitting: Internal Medicine

## 2016-01-04 ENCOUNTER — Encounter: Payer: Medicare Other | Admitting: Internal Medicine

## 2016-01-04 NOTE — Telephone Encounter (Signed)
Last filled 11-16-15 #90 Last OV 11-10-15 Next OV 07-06-16

## 2016-01-05 NOTE — Telephone Encounter (Signed)
Approved: #90 x 0 

## 2016-01-05 NOTE — Telephone Encounter (Signed)
Left refill on voice mail at pharmacy  

## 2016-01-12 ENCOUNTER — Encounter: Payer: Self-pay | Admitting: Internal Medicine

## 2016-01-12 ENCOUNTER — Ambulatory Visit (INDEPENDENT_AMBULATORY_CARE_PROVIDER_SITE_OTHER): Payer: Medicare Other | Admitting: Internal Medicine

## 2016-01-12 VITALS — BP 124/78 | HR 84 | Ht 68.5 in | Wt 212.0 lb

## 2016-01-12 DIAGNOSIS — K642 Third degree hemorrhoids: Secondary | ICD-10-CM

## 2016-01-12 DIAGNOSIS — K648 Other hemorrhoids: Secondary | ICD-10-CM | POA: Diagnosis not present

## 2016-01-12 NOTE — Patient Instructions (Signed)
HEMORRHOID BANDING PROCEDURE    FOLLOW-UP CARE   1. The procedure you have had should have been relatively painless since the banding of the area involved does not have nerve endings and there is no pain sensation.  The rubber band cuts off the blood supply to the hemorrhoid and the band may fall off as soon as 48 hours after the banding (the band may occasionally be seen in the toilet bowl following a bowel movement). You may notice a temporary feeling of fullness in the rectum which should respond adequately to plain Tylenol or Motrin.  2. Following the banding, avoid strenuous exercise that evening and resume full activity the next day.  A sitz bath (soaking in a warm tub) or bidet is soothing, and can be useful for cleansing the area after bowel movements.     3. To avoid constipation, take two tablespoons of natural wheat bran, natural oat bran, flax, Benefiber or any over the counter fiber supplement and increase your water intake to 7-8 glasses daily.    4. Unless you have been prescribed anorectal medication, do not put anything inside your rectum for two weeks: No suppositories, enemas, fingers, etc.  5. Occasionally, you may have more bleeding than usual after the banding procedure.  This is often from the untreated hemorrhoids rather than the treated one.  Don't be concerned if there is a tablespoon or so of blood.  If there is more blood than this, lie flat with your bottom higher than your head and apply an ice pack to the area. If the bleeding does not stop within a half an hour or if you feel faint, call our office at [phone] or go to the emergency room.  6. Problems are not common; however, if there is a substantial amount of bleeding, severe pain, chills, fever or difficulty passing urine (very rare) or other problems, you should call us at [phone] or report to the nearest emergency room.  7. Do not stay seated continuously for more than 2-3 hours for a day or two  after the procedure.  Tighten your buttock muscles 10-15 times every two hours and take 10-15 deep breaths every 1-2 hours.  Do not spend more than a few minutes on the toilet if you cannot empty your bowel; instead re-visit the toilet at a later time.    Use 2-3 tablespoons of benefiber daily.   Follow up with Dr. Carlean Purl in 6-8 weeks.    I appreciate the opportunity to care for you. Silvano Rusk, MD, [facility]

## 2016-01-12 NOTE — Assessment & Plan Note (Signed)
RA and LL banded 

## 2016-01-12 NOTE — Progress Notes (Signed)
Patient ID: Randy Mcdaniel., male   DOB: 1940/10/03, 75 y.o.   MRN: [id number]         A little less bleeding after 3/29 banding Still fecal soiling Says decreased toilet time but still strains some  Rectal - perianal erythema, violaceous with decreased resting tone and capacious vault  Premed 5% lidocaine and 0.125% NTG Anoscopy was performed with the patient in the left lateral decubitus position  and revealed Gr 2 LL and RA and Gr 1 RP internal hemorrhoids.  PROCEDURE NOTE: The patient presents with symptomatic grade 2  hemorrhoids, requesting rubber band ligation of his/her hemorrhoidal disease.  All risks, benefits and alternative forms of therapy were described and informed consent was obtained.    The decision was made to band the RA and LL internal hemorrhoid, and the [place] was used to perform band ligation without complication.  Digital anorectal examination was then performed to assure proper positioning of the band, and to adjust the banded tissue as required.  The patient was discharged home without pain or other issues.  Dietary and behavioral recommendations were given and along with follow-up instructions.     The following adjunctive treatments were recommended:  Increase Benefiber to 2-3 tbsp/day  The patient will return in 6-8 weeks for  follow-up and possible additional banding as required. No complications were encountered and the patient tolerated the procedure well.  I appreciate the opportunity to care for this patient.  [id number] Silvio Pate, MD

## 2016-01-14 ENCOUNTER — Encounter: Payer: Self-pay | Admitting: Internal Medicine

## 2016-02-15 ENCOUNTER — Telehealth: Payer: Self-pay | Admitting: Internal Medicine

## 2016-02-15 NOTE — Telephone Encounter (Signed)
Please phone in the alprazolam.

## 2016-02-15 NOTE — Telephone Encounter (Signed)
Spoke to pt. He seems to think he has a methylphenidate rx at the pharmacy already. He said he will call me back. Tried to call in the alprazolam, but the pharmacy line was busy.

## 2016-02-15 NOTE — Telephone Encounter (Signed)
Left refill on voice mail at pharmacy for alprazolam. Shredded the methylphenidate

## 2016-02-15 NOTE — Telephone Encounter (Signed)
Patient called Randy Mcdaniel back to let her know his Ritalin rx is at the pharmacy, but he does need the other rx called in to the pharmacy.

## 2016-02-15 NOTE — Telephone Encounter (Signed)
Alprazolam last filled 01-05-16 #90 Methylphenidate last filled 01-13-16 #60 Last OV 11-20-15 Next OV 07-06-16

## 2016-03-14 ENCOUNTER — Other Ambulatory Visit: Payer: Self-pay

## 2016-03-14 MED ORDER — METHYLPHENIDATE HCL 5 MG PO TABS: 5.0000 mg | ORAL_TABLET | Freq: Two times a day (BID) | ORAL | Status: DC

## 2016-03-14 NOTE — Telephone Encounter (Signed)
I called pt to advise his rx was ready. He asked if you can go ahead and do the rxs for the next 2 months, also, like you have in the past. He did not request that earlier today

## 2016-03-14 NOTE — Telephone Encounter (Signed)
Pt left v//m requesting refill ritalin. Call when ready for pick up. Last printed # 60 on 02/15/16. Pt last seen 11/20/15.

## 2016-03-15 ENCOUNTER — Encounter: Payer: Self-pay | Admitting: Internal Medicine

## 2016-03-15 ENCOUNTER — Ambulatory Visit (INDEPENDENT_AMBULATORY_CARE_PROVIDER_SITE_OTHER): Payer: Medicare Other | Admitting: Internal Medicine

## 2016-03-15 VITALS — BP 136/72 | HR 100 | Ht 68.0 in | Wt 217.0 lb

## 2016-03-15 DIAGNOSIS — K642 Third degree hemorrhoids: Secondary | ICD-10-CM | POA: Diagnosis not present

## 2016-03-15 MED ORDER — METHYLPHENIDATE HCL 5 MG PO TABS: 5.0000 mg | ORAL_TABLET | Freq: Two times a day (BID) | ORAL | Status: DC

## 2016-03-15 NOTE — Patient Instructions (Signed)
    Please take 3 tablespoons of Benefiber daily.     I appreciate the opportunity to care for you. Silvano Rusk, MD, [facility]

## 2016-03-15 NOTE — Telephone Encounter (Signed)
Patient's wife came to office to pick scripts up.

## 2016-03-15 NOTE — Assessment & Plan Note (Signed)
Improved significantlyso will observe Increase benefiber to 3 tablespoons/day NOT teaspoons - this should also help sxs

## 2016-03-15 NOTE — Progress Notes (Signed)
Patient ID: [name], male   DOB: 01-15-1941, 75 y.o.   MRN: [id number]   Here for f/u of hemorrhoids - sxs have been rectal bleeding and fecal soiling and the symptoms are significantly better - really only minor and he is pleased overall. Does feel a pulsating sensation in the rectal area at times. He is on benefiber 1 tsp tid - it helps but he has incomplete defecation still. C/o of gas and bloating also.  Rectal - small anal tags, mild violaceous- erythematous skin discoloration. Reduced resting tone, large vault with formed brown stool occupying - nontender  Anoscopy shows Gr 1-2 RP and LL internal hemorrhoids and Gr 1 RA internal hemorrhoids  Prolapsed internal hemorrhoids, grade 3 Improved significantlyso will observe Increase benefiber to 3 tablespoons/day NOT teaspoons - this should also help sxs    cc;Richard Silvio Pate, MD

## 2016-03-15 NOTE — Telephone Encounter (Signed)
Next 2 months done

## 2016-03-15 NOTE — Telephone Encounter (Signed)
Left message on voicemail to call back. No answer at home number.

## 2016-04-07 ENCOUNTER — Other Ambulatory Visit: Payer: Self-pay

## 2016-04-07 MED ORDER — ALPRAZOLAM 0.5 MG PO TABS: 0.5000 mg | ORAL_TABLET | Freq: Three times a day (TID) | ORAL | 0 refills | Status: DC | PRN

## 2016-04-07 NOTE — Telephone Encounter (Signed)
Pt left v/m requesting status of alprazolam refill. 

## 2016-04-07 NOTE — Telephone Encounter (Signed)
Last filled 02-15-16 #90 Last OV 11-20-15 Next OV 07-06-16

## 2016-04-07 NOTE — Telephone Encounter (Signed)
Approved: #90 x 0 

## 2016-04-07 NOTE — Telephone Encounter (Signed)
Left refill on voice mail at pharmacy  

## 2016-04-12 ENCOUNTER — Other Ambulatory Visit: Payer: Self-pay | Admitting: Internal Medicine

## 2016-05-06 ENCOUNTER — Other Ambulatory Visit: Payer: Self-pay

## 2016-05-06 MED ORDER — METHYLPHENIDATE HCL 5 MG PO TABS: 5.0000 mg | ORAL_TABLET | Freq: Two times a day (BID) | ORAL | 0 refills | Status: DC

## 2016-05-06 NOTE — Telephone Encounter (Signed)
Spoke to pt wife that rx is up front ready for pickup

## 2016-05-06 NOTE — Telephone Encounter (Signed)
Last filled 04-12-16 #60 Last OV 11-20-15 Next OV 07-06-16

## 2016-05-17 ENCOUNTER — Other Ambulatory Visit: Payer: Self-pay | Admitting: Internal Medicine

## 2016-05-17 ENCOUNTER — Other Ambulatory Visit: Payer: Self-pay

## 2016-05-17 NOTE — Telephone Encounter (Signed)
Last filled 04-07-16 #90 Last OV 11-20-15 Next OV 07-06-16

## 2016-05-18 MED ORDER — ALPRAZOLAM 0.5 MG PO TABS: 0.5000 mg | ORAL_TABLET | Freq: Three times a day (TID) | ORAL | 0 refills | Status: DC | PRN

## 2016-05-18 NOTE — Telephone Encounter (Signed)
Left refill on voice mail at pharmacy  

## 2016-05-18 NOTE — Telephone Encounter (Signed)
Approved: #90 x 0 

## 2016-05-25 ENCOUNTER — Encounter: Payer: Medicare Other | Admitting: Internal Medicine

## 2016-07-06 ENCOUNTER — Other Ambulatory Visit: Payer: Self-pay

## 2016-07-06 ENCOUNTER — Ambulatory Visit (INDEPENDENT_AMBULATORY_CARE_PROVIDER_SITE_OTHER): Payer: Medicare Other | Admitting: Internal Medicine

## 2016-07-06 ENCOUNTER — Encounter: Payer: Self-pay | Admitting: Internal Medicine

## 2016-07-06 ENCOUNTER — Telehealth: Payer: Self-pay

## 2016-07-06 VITALS — BP 142/88 | HR 107 | Temp 98.5°F | Ht 69.0 in | Wt 212.0 lb

## 2016-07-06 DIAGNOSIS — E039 Hypothyroidism, unspecified: Secondary | ICD-10-CM | POA: Diagnosis not present

## 2016-07-06 DIAGNOSIS — E1149 Type 2 diabetes mellitus with other diabetic neurological complication: Secondary | ICD-10-CM

## 2016-07-06 DIAGNOSIS — Z7189 Other specified counseling: Secondary | ICD-10-CM

## 2016-07-06 DIAGNOSIS — Z23 Encounter for immunization: Secondary | ICD-10-CM | POA: Diagnosis not present

## 2016-07-06 DIAGNOSIS — F3341 Major depressive disorder, recurrent, in partial remission: Secondary | ICD-10-CM

## 2016-07-06 DIAGNOSIS — M48062 Spinal stenosis, lumbar region with neurogenic claudication: Secondary | ICD-10-CM | POA: Diagnosis not present

## 2016-07-06 DIAGNOSIS — Z0000 Encounter for general adult medical examination without abnormal findings: Secondary | ICD-10-CM | POA: Diagnosis not present

## 2016-07-06 LAB — CBC WITH DIFFERENTIAL/PLATELET
BASOS ABS: 0 10*3/uL (ref 0.0–0.1)
BASOS PCT: 0.5 % (ref 0.0–3.0)
EOS ABS: 0.1 10*3/uL (ref 0.0–0.7)
Eosinophils Relative: 1.2 % (ref 0.0–5.0)
HCT: 41.5 % (ref 39.0–52.0)
Hemoglobin: 13.9 g/dL (ref 13.0–17.0)
LYMPHS PCT: 13.7 % (ref 12.0–46.0)
Lymphs Abs: 1.1 10*3/uL (ref 0.7–4.0)
MCHC: 33.4 g/dL (ref 30.0–36.0)
MCV: 88.1 fl (ref 78.0–100.0)
MONO ABS: 0.5 10*3/uL (ref 0.1–1.0)
Monocytes Relative: 6.7 % (ref 3.0–12.0)
NEUTROS ABS: 6.1 10*3/uL (ref 1.4–7.7)
Neutrophils Relative %: 77.9 % — ABNORMAL HIGH (ref 43.0–77.0)
PLATELETS: 267 10*3/uL (ref 150.0–400.0)
RBC: 4.71 Mil/uL (ref 4.22–5.81)
RDW: 15.3 % (ref 11.5–15.5)
WBC: 7.8 10*3/uL (ref 4.0–10.5)

## 2016-07-06 LAB — COMPREHENSIVE METABOLIC PANEL
ALK PHOS: 50 U/L (ref 39–117)
ALT: 20 U/L (ref 0–53)
AST: 42 U/L — AB (ref 0–37)
Albumin: 4.4 g/dL (ref 3.5–5.2)
BILIRUBIN TOTAL: 0.7 mg/dL (ref 0.2–1.2)
BUN: 21 mg/dL (ref 6–23)
CO2: 31 mEq/L (ref 19–32)
CREATININE: 1.21 mg/dL (ref 0.40–1.50)
Calcium: 10 mg/dL (ref 8.4–10.5)
Chloride: 102 mEq/L (ref 96–112)
GFR: 61.86 mL/min (ref >60.00)
GLUCOSE: 168 mg/dL — AB (ref 70–99)
Potassium: 4.2 mEq/L (ref 3.5–5.1)
Sodium: 142 mEq/L (ref 135–145)
TOTAL PROTEIN: 7.4 g/dL (ref 6.0–8.3)

## 2016-07-06 LAB — TSH: TSH: 5.67 u[IU]/mL — ABNORMAL HIGH (ref 0.35–4.50)

## 2016-07-06 LAB — LIPID PANEL
CHOL/HDL RATIO: 3
Cholesterol: 143 mg/dL (ref 0–200)
HDL: 51.6 mg/dL (ref >39.00)
LDL CALC: 72 mg/dL (ref 0–99)
NONHDL: 91.57
TRIGLYCERIDES: 99 mg/dL (ref 0.0–149.0)
VLDL: 19.8 mg/dL (ref 0.0–40.0)

## 2016-07-06 LAB — HEMOGLOBIN A1C: Hgb A1c MFr Bld: 7.4 % — ABNORMAL HIGH (ref 4.6–6.5)

## 2016-07-06 LAB — T4, FREE: Free T4: 0.83 ng/dL (ref 0.60–1.60)

## 2016-07-06 LAB — VITAMIN B12

## 2016-07-06 MED ORDER — METHYLPHENIDATE HCL 5 MG PO TABS: 5.0000 mg | ORAL_TABLET | Freq: Two times a day (BID) | ORAL | 0 refills | Status: DC

## 2016-07-06 MED ORDER — ALPRAZOLAM 0.5 MG PO TABS: 0.5000 mg | ORAL_TABLET | Freq: Three times a day (TID) | ORAL | 0 refills | Status: DC | PRN

## 2016-07-06 MED ORDER — TETANUS-DIPHTHERIA TOXOIDS TD 5-2 LFU IM INJ: 0.5000 mL | INJECTION | Freq: Once | INTRAMUSCULAR | 0 refills | Status: AC

## 2016-07-06 NOTE — Telephone Encounter (Signed)
Yes That is fine but use Tdap (adacel for children only I think)

## 2016-07-06 NOTE — Telephone Encounter (Signed)
Last filled 05-18-16 #90 Last OV Today for [facility]

## 2016-07-06 NOTE — Telephone Encounter (Addendum)
Randy Mcdaniel at [facility] left v/m wanting to know if could substitute Adacel or Tdap for the Tenivac. [facility] has the Adacel in stock. Randy Mcdaniel request cb.

## 2016-07-06 NOTE — Telephone Encounter (Signed)
Approved: #90 x 0 

## 2016-07-06 NOTE — Assessment & Plan Note (Signed)
Will recheck labs 

## 2016-07-06 NOTE — Assessment & Plan Note (Signed)
Seems to still have good control Due for labs

## 2016-07-06 NOTE — Telephone Encounter (Signed)
Left refill on voice mail at pharmacy  

## 2016-07-06 NOTE — Assessment & Plan Note (Signed)
Increasing weakness and now marked functional decline Will set up for PT/OT

## 2016-07-06 NOTE — Assessment & Plan Note (Signed)
See social history Blank forms given 

## 2016-07-06 NOTE — Addendum Note (Signed)
Addended by: Pilar Grammes on: 07/06/2016 09:49 AM   Modules accepted: Orders

## 2016-07-06 NOTE — Progress Notes (Signed)
Pre visit review using our clinic review tool, if applicable. No additional management support is needed unless otherwise documented below in the visit note. 

## 2016-07-06 NOTE — Telephone Encounter (Signed)
Spoke to pt's wife. She said to send the rx for the tetanus vaccine to the pharmacy

## 2016-07-06 NOTE — Assessment & Plan Note (Signed)
I have personally reviewed the Medicare Annual Wellness questionnaire and have noted 1. The patient's medical and social history 2. Their use of alcohol, tobacco or illicit drugs 3. Their current medications and supplements 4. The patient's functional ability including ADL's, fall risks, home safety risks and hearing or visual             impairment. 5. Diet and physical activities 6. Evidence for depression or mood disorders  The patients weight, height, BMI and visual acuity have been recorded in the chart I have made referrals, counseling and provided education to the patient based review of the above and I have provided the pt with a written personalized care plan for preventive services.  I have provided you with a copy of your personalized plan for preventive services. Please take the time to review along with your updated medication list.  Flu vaccine today Recent colon No PSA due to age Functional issues

## 2016-07-06 NOTE — Progress Notes (Signed)
Subjective:    Patient ID: Randy Land., male    DOB: [birth year], 75 y.o.   MRN: [id number]  HPI Here with wife for Medicare wellness and follow up of chronic health conditions Reviewed form and advanced directives Reviewed other doctors No tobacco or alcohol Not exercising --his usual [place] once this year--- getting OOB. No injury Chronic depression Vision is fine Hearing is okay Needs help now with ADLs No apparent memory problems  "He's not good....one step away from being an invalid"--per wife Not walking well--needs walker Needs help with bathing, dressing Stays in PJs often--- wife goes to work  Depression doesn't seem worse Sleeps fairly well--then awakens at [place] (and has to move bowels) Spends a lot of time on the commode  Legs are numb--worse lately Pain is not bad Rarely checks sugars--- 120-145 random (afternoon)  No back pain No sig leg pain  Current Outpatient Prescriptions on File Prior to Visit  Medication Sig Dispense Refill  . acetaminophen (TYLENOL) 650 MG CR tablet Take 650 mg by mouth 3 (three) times daily.     Marland Kitchen ALPRAZolam (XANAX) 0.5 MG tablet Take 1 tablet (0.5 mg total) by mouth 3 (three) times daily as needed. 90 tablet 0  . atorvastatin (LIPITOR) 20 MG tablet TAKE 1 TABLET BY MOUTH ONCE A DAY 90 tablet 1  . escitalopram (LEXAPRO) 20 MG tablet TAKE 2 TABLETS BY MOUTH EVERY MORNING (Patient taking differently: TAKE 1 TABLETS BY MOUTH EVERY MORNING) 180 tablet 3  . gabapentin (NEURONTIN) 300 MG capsule Take 1-2 capsules (300-600 mg total) by mouth at bedtime. 60 capsule 11  . glipiZIDE (GLUCOTROL) 5 MG tablet Take 1 tablet (5 mg total) by mouth 2 (two) times daily before a meal. 180 tablet 3  . glucose blood test strip Dx: 250.40 100 each 6  . levothyroxine (SYNTHROID, LEVOTHROID) 75 MCG tablet TAKE 1 TABLET BY MOUTH ONCE A DAY 90 tablet 3  . lisinopril (PRINIVIL,ZESTRIL) 10 MG tablet TAKE ONE TABLET BY MOUTH ONCE A DAY 90 tablet 1  .  metFORMIN (GLUCOPHAGE) 500 MG tablet TAKE 1 TABLET BY MOUTH 3 TIMES DAILY 270 tablet 3  . methylphenidate (RITALIN) 5 MG tablet Take 1 tablet (5 mg total) by mouth 2 (two) times daily. 60 tablet 0  . naproxen sodium (ANAPROX) 220 MG tablet Take 220-440 mg by mouth at bedtime as needed.    Marland Kitchen OLANZapine (ZYPREXA) 10 MG tablet TAKE 1 TABLET BY MOUTH AT BEDTIME 30 tablet 11  . vitamin B-12 (CYANOCOBALAMIN) 1000 MCG tablet Take 1,000 mcg by mouth daily.      Marland Kitchen tetanus & diphtheria toxoids, adult, (TENIVAC) 5-2 LFU injection Inject 0.5 mLs into the muscle once. (Patient not taking: Reported on 07/06/2016) 0.5 mL 0   No current facility-administered medications on file prior to visit.     Allergies  Allergen Reactions  . Vytorin [Ezetimibe-Simvastatin]     REACTION: nausea    Past Medical History:  Diagnosis Date  . Anxiety   . Depression    recurrent and severe  . Diabetes mellitus    Type II with nephropathy  . HLD (hyperlipidemia)   . Hypothyroidism   . Internal hemorrhoid   . Osteoarthritis   . Personal history of colonic adenoma 12/03/2012    Past Surgical History:  Procedure Laterality Date  . COLONOSCOPY    . HEMORRHOID BANDING    . TOTAL KNEE ARTHROPLASTY  11/09   Right-Dr. Clair Gulling ([facility])  . TOTAL KNEE ARTHROPLASTY  6/10   Left-Dr. Clair Gulling ([facility])  . UMBILICAL HERNIA REPAIR  2000    Family History  Problem Relation Age of Onset  . Heart failure Father   . Diabetes Father   . Depression Father   . Coronary artery disease Father   . Breast cancer Sister   . Stomach cancer Mother   . Lung cancer Sister     ?   . Depression Sister   . Stroke      Paternal side  . Parkinsonism      Cousin  . Colon cancer Neg Hx   . Rectal cancer Neg Hx   . Esophageal cancer Neg Hx   . Colon polyps Neg Hx     Social History   Social History  . Marital status: Married    Spouse name: N/A  . Number of children: 2  . Years of education: N/A   Occupational History  . Retired      Social History Main Topics  . Smoking status: Never Smoker  . Smokeless tobacco: Former Systems developer    Types: Chew  . Alcohol use No  . Drug use: No  . Sexual activity: Not on file   Other Topics Concern  . Not on file   Social History Narrative   Has living will.   No formal health care POA---requests wife to be   Would accept resuscitation attempts   No tube feeds if cognitively unaware   Review of Systems Appetite is good Weight fairly stable Wears seat belt Teeth okay--overdue for dentist (years) Bowel urgency but doesn't go a lot. Strains and hemorrhoids. Some urinary urgency-- slow stream. Rare incontinence No skin problems No chest pain or SOB No syncope. Occasional orthostatic dizziness--brief No edema    Objective:   Physical Exam  Constitutional: He is oriented to person, place, and time. He appears well-nourished. No distress.  HENT:  Mouth/Throat: Oropharynx is clear and moist. No oropharyngeal exudate.  Upper denture  Neck: Normal range of motion. Neck supple. No thyromegaly present.  Cardiovascular: Normal rate, regular rhythm, normal heart sounds and intact distal pulses.  Exam reveals no gallop.   No murmur heard. Feet cool but palpable pulses  Pulmonary/Chest: Effort normal and breath sounds normal. No respiratory distress. He has no wheezes. He has no rales.  Abdominal: Soft. There is no tenderness.  Musculoskeletal: He exhibits no edema or tenderness.  Lymphadenopathy:    He has no cervical adenopathy.  Neurological: He is alert and oriented to person, place, and time.  President-- "Daisy Floro, Ophelia Shoulder" [phone]..  I don't know D-l-r-[id number]-w Recall 2/3  Very little sensation in feet General weakness  Skin: No rash noted. No erythema.  No foot lesions  Psychiatric:  Mild psychomotor retardation but not different than his usual. No overt depression          Assessment & Plan:

## 2016-07-06 NOTE — Assessment & Plan Note (Signed)
Seems to be controlled Continue the meds

## 2016-07-07 NOTE — Telephone Encounter (Signed)
According to immunize.org "When feasible, providers should administer Boostrix ([place]) to adults age 76 and older as it is licensed for this age group. Adacel Education officer, museum) is licensed for use in people age 30 through 71. However, ACIP concluded that either vaccine administered to a person age 66 or older is immunogenic and will provide protection. A dose of either vaccine is considered valid."

## 2016-07-07 NOTE — Telephone Encounter (Signed)
Spoke to [place] at the pharmacy. He is not sure why they only stock Adacel. He will check with Gerald Stabs to see if they can get Boostrix. Otherwise, I told him the pt could have Adacel.

## 2016-07-28 ENCOUNTER — Telehealth: Payer: Self-pay

## 2016-07-28 NOTE — Telephone Encounter (Signed)
Called pt. No answer. Will try again tomorrow.

## 2016-07-28 NOTE — Telephone Encounter (Signed)
Pt left vm; pts wife is presently taking pt to PT and pts wife is going to have knee surgery and will not be able to take pt to PT appts; pt wants to know if Dr Silvio Pate could order PT in pts home. Pt request cb. Last seen 07/06/16.

## 2016-07-28 NOTE — Telephone Encounter (Signed)
I still don't think that Medicare will consider him home bound. Might it be reasonable to take a break, and then resume when he can get driven again?

## 2016-08-03 NOTE — Telephone Encounter (Signed)
Spoke to pt. He agreed with taking a break.

## 2016-08-15 ENCOUNTER — Other Ambulatory Visit: Payer: Self-pay | Admitting: Internal Medicine

## 2016-08-15 NOTE — Telephone Encounter (Signed)
Last filled 07-06-16 #90 Last OV 07-06-16 Next OV 01-04-17

## 2016-08-15 NOTE — Telephone Encounter (Signed)
Approved: #90 x 0 

## 2016-08-16 NOTE — Telephone Encounter (Signed)
Left refill on voice mail at pharmacy  

## 2016-08-30 ENCOUNTER — Other Ambulatory Visit: Payer: Self-pay | Admitting: Internal Medicine

## 2016-10-03 ENCOUNTER — Other Ambulatory Visit: Payer: Self-pay | Admitting: Internal Medicine

## 2016-10-04 NOTE — Telephone Encounter (Signed)
Left refill on voice mail at pharmacy  

## 2016-10-04 NOTE — Telephone Encounter (Signed)
Last filled 08-16-16#90 Last OV 07-06-16 Next OV 01-04-17

## 2016-10-04 NOTE — Telephone Encounter (Signed)
Approved: #90 x 0 

## 2016-10-11 ENCOUNTER — Other Ambulatory Visit: Payer: Self-pay | Admitting: Internal Medicine

## 2016-11-15 ENCOUNTER — Other Ambulatory Visit: Payer: Self-pay | Admitting: Internal Medicine

## 2016-12-16 ENCOUNTER — Telehealth: Payer: Self-pay | Admitting: Internal Medicine

## 2016-12-16 ENCOUNTER — Inpatient Hospital Stay
Admission: EM | Admit: 2016-12-16 | Discharge: 2016-12-20 | DRG: 394 | Disposition: A | Discharge status: Home Health | Payer: Medicare Other | Attending: Specialist | Admitting: Specialist

## 2016-12-16 ENCOUNTER — Encounter: Payer: Self-pay | Admitting: Emergency Medicine

## 2016-12-16 DIAGNOSIS — F329 Major depressive disorder, single episode, unspecified: Secondary | ICD-10-CM | POA: Diagnosis present

## 2016-12-16 DIAGNOSIS — N183 Chronic kidney disease, stage 3 (moderate): Secondary | ICD-10-CM | POA: Diagnosis present

## 2016-12-16 DIAGNOSIS — Z7984 Long term (current) use of oral hypoglycemic drugs: Secondary | ICD-10-CM

## 2016-12-16 DIAGNOSIS — E1140 Type 2 diabetes mellitus with diabetic neuropathy, unspecified: Secondary | ICD-10-CM | POA: Diagnosis present

## 2016-12-16 DIAGNOSIS — K922 Gastrointestinal hemorrhage, unspecified: Secondary | ICD-10-CM | POA: Diagnosis present

## 2016-12-16 DIAGNOSIS — I129 Hypertensive chronic kidney disease with stage 1 through stage 4 chronic kidney disease, or unspecified chronic kidney disease: Secondary | ICD-10-CM | POA: Diagnosis present

## 2016-12-16 DIAGNOSIS — F419 Anxiety disorder, unspecified: Secondary | ICD-10-CM | POA: Diagnosis present

## 2016-12-16 DIAGNOSIS — E1122 Type 2 diabetes mellitus with diabetic chronic kidney disease: Secondary | ICD-10-CM | POA: Diagnosis present

## 2016-12-16 DIAGNOSIS — K645 Perianal venous thrombosis: Secondary | ICD-10-CM | POA: Diagnosis present

## 2016-12-16 DIAGNOSIS — D62 Acute posthemorrhagic anemia: Secondary | ICD-10-CM

## 2016-12-16 DIAGNOSIS — N400 Enlarged prostate without lower urinary tract symptoms: Secondary | ICD-10-CM | POA: Diagnosis present

## 2016-12-16 DIAGNOSIS — Z87891 Personal history of nicotine dependence: Secondary | ICD-10-CM

## 2016-12-16 DIAGNOSIS — Z79899 Other long term (current) drug therapy: Secondary | ICD-10-CM | POA: Diagnosis not present

## 2016-12-16 DIAGNOSIS — K269 Duodenal ulcer, unspecified as acute or chronic, without hemorrhage or perforation: Secondary | ICD-10-CM | POA: Diagnosis present

## 2016-12-16 DIAGNOSIS — Z96653 Presence of artificial knee joint, bilateral: Secondary | ICD-10-CM | POA: Diagnosis present

## 2016-12-16 DIAGNOSIS — K640 First degree hemorrhoids: Secondary | ICD-10-CM | POA: Diagnosis present

## 2016-12-16 DIAGNOSIS — K219 Gastro-esophageal reflux disease without esophagitis: Secondary | ICD-10-CM | POA: Diagnosis present

## 2016-12-16 DIAGNOSIS — E039 Hypothyroidism, unspecified: Secondary | ICD-10-CM | POA: Diagnosis present

## 2016-12-16 DIAGNOSIS — K5900 Constipation, unspecified: Secondary | ICD-10-CM | POA: Diagnosis present

## 2016-12-16 DIAGNOSIS — E785 Hyperlipidemia, unspecified: Secondary | ICD-10-CM | POA: Diagnosis present

## 2016-12-16 DIAGNOSIS — R718 Other abnormality of red blood cells: Secondary | ICD-10-CM | POA: Diagnosis present

## 2016-12-16 DIAGNOSIS — E1149 Type 2 diabetes mellitus with other diabetic neurological complication: Secondary | ICD-10-CM

## 2016-12-16 DIAGNOSIS — K649 Unspecified hemorrhoids: Secondary | ICD-10-CM | POA: Diagnosis not present

## 2016-12-16 DIAGNOSIS — R55 Syncope and collapse: Secondary | ICD-10-CM | POA: Diagnosis present

## 2016-12-16 DIAGNOSIS — I959 Hypotension, unspecified: Secondary | ICD-10-CM | POA: Diagnosis present

## 2016-12-16 HISTORY — DX: Gastrointestinal hemorrhage, unspecified: K92.2

## 2016-12-16 LAB — COMPREHENSIVE METABOLIC PANEL
ALK PHOS: 56 U/L (ref 38–126)
ALT: 18 U/L (ref 17–63)
AST: 44 U/L — AB (ref 15–41)
Albumin: 4.2 g/dL (ref 3.5–5.0)
Anion gap: 9 (ref 5–15)
BILIRUBIN TOTAL: 0.5 mg/dL (ref 0.3–1.2)
BUN: 29 mg/dL — AB (ref 6–20)
CALCIUM: 9.4 mg/dL (ref 8.9–10.3)
CO2: 23 mmol/L (ref 22–32)
CREATININE: 1.41 mg/dL — AB (ref 0.61–1.24)
Chloride: 106 mmol/L (ref 101–111)
GFR calc Af Amer: 54 mL/min — ABNORMAL LOW (ref >60)
GFR, EST NON AFRICAN AMERICAN: 47 mL/min — AB (ref >60)
Glucose, Bld: 212 mg/dL — ABNORMAL HIGH (ref 65–99)
Potassium: 4.8 mmol/L (ref 3.5–5.1)
Sodium: 138 mmol/L (ref 135–145)
TOTAL PROTEIN: 7.6 g/dL (ref 6.5–8.1)

## 2016-12-16 LAB — CBC
HCT: 24 % — ABNORMAL LOW (ref 40.0–52.0)
Hemoglobin: 7.7 g/dL — ABNORMAL LOW (ref 13.0–18.0)
MCH: 25.3 pg — ABNORMAL LOW (ref 26.0–34.0)
MCHC: 32 g/dL (ref 32.0–36.0)
MCV: 79.3 fL — AB (ref 80.0–100.0)
PLATELETS: 424 10*3/uL (ref 150–440)
RBC: 3.02 MIL/uL — ABNORMAL LOW (ref 4.40–5.90)
RDW: 15 % — AB (ref 11.5–14.5)
WBC: 8.7 10*3/uL (ref 3.8–10.6)

## 2016-12-16 LAB — GLUCOSE, CAPILLARY: Glucose-Capillary: 106 mg/dL — ABNORMAL HIGH (ref 65–99)

## 2016-12-16 LAB — ABO/RH: ABO/RH(D): A POS

## 2016-12-16 LAB — HEMOGLOBIN
Hemoglobin: 7.7 g/dL — ABNORMAL LOW (ref 13.0–18.0)
Hemoglobin: 8.4 g/dL — ABNORMAL LOW (ref 13.0–18.0)

## 2016-12-16 LAB — PREPARE RBC (CROSSMATCH)

## 2016-12-16 MED ORDER — METHYLPHENIDATE HCL 5 MG PO TABS: 5.0000 mg | ORAL_TABLET | Freq: Every day | ORAL | Status: DC

## 2016-12-16 MED ORDER — SODIUM CHLORIDE 0.9 % IV SOLN
1000.0000 mL | Freq: Once | INTRAVENOUS | Status: AC
  Administered 2016-12-16: 1000 mL via INTRAVENOUS

## 2016-12-16 MED ORDER — ESCITALOPRAM OXALATE 10 MG PO TABS
20.0000 mg | ORAL_TABLET | Freq: Every morning | ORAL | Status: DC
Start: 2016-12-17 — End: 2016-12-20
  Administered 2016-12-17 – 2016-12-20 (×4): 20 mg via ORAL
  Filled 2016-12-16 (×4): qty 2

## 2016-12-16 MED ORDER — ACETAMINOPHEN ER 650 MG PO TBCR: 650.0000 mg | EXTENDED_RELEASE_TABLET | Freq: Three times a day (TID) | ORAL | Status: DC

## 2016-12-16 MED ORDER — ATORVASTATIN CALCIUM 20 MG PO TABS
20.0000 mg | ORAL_TABLET | Freq: Every day | ORAL | Status: DC
  Administered 2016-12-16 – 2016-12-19 (×4): 20 mg via ORAL
  Filled 2016-12-16 (×4): qty 1

## 2016-12-16 MED ORDER — SODIUM CHLORIDE 0.9% FLUSH
3.0000 mL | Freq: Two times a day (BID) | INTRAVENOUS | Status: DC
  Administered 2016-12-17 – 2016-12-20 (×5): 3 mL via INTRAVENOUS

## 2016-12-16 MED ORDER — VITAMIN B-12 1000 MCG PO TABS
1000.0000 ug | ORAL_TABLET | Freq: Every day | ORAL | Status: DC
  Administered 2016-12-16 – 2016-12-20 (×5): 1000 ug via ORAL
  Filled 2016-12-16 (×5): qty 1

## 2016-12-16 MED ORDER — LEVOTHYROXINE SODIUM 75 MCG PO TABS
75.0000 ug | ORAL_TABLET | Freq: Every day | ORAL | Status: DC
  Administered 2016-12-17 – 2016-12-20 (×4): 75 ug via ORAL
  Filled 2016-12-16 (×4): qty 1

## 2016-12-16 MED ORDER — INSULIN ASPART 100 UNIT/ML ~~LOC~~ SOLN
0.0000 [IU] | Freq: Three times a day (TID) | SUBCUTANEOUS | Status: DC
  Administered 2016-12-17 – 2016-12-18 (×2): 1 [IU] via SUBCUTANEOUS
  Administered 2016-12-18 – 2016-12-19 (×3): 2 [IU] via SUBCUTANEOUS
  Administered 2016-12-19: 3 [IU] via SUBCUTANEOUS
  Administered 2016-12-19 – 2016-12-20 (×2): 2 [IU] via SUBCUTANEOUS
  Administered 2016-12-20: 1 [IU] via SUBCUTANEOUS
  Filled 2016-12-16 (×3): qty 2
  Filled 2016-12-16: qty 1
  Filled 2016-12-16 (×2): qty 3
  Filled 2016-12-16: qty 2
  Filled 2016-12-16: qty 1
  Filled 2016-12-16: qty 2
  Filled 2016-12-16: qty 1

## 2016-12-16 MED ORDER — PANTOPRAZOLE SODIUM 40 MG IV SOLR
40.0000 mg | Freq: Two times a day (BID) | INTRAVENOUS | Status: DC
  Administered 2016-12-16 – 2016-12-17 (×3): 40 mg via INTRAVENOUS
  Filled 2016-12-16 (×3): qty 40

## 2016-12-16 MED ORDER — OLANZAPINE 10 MG PO TABS
10.0000 mg | ORAL_TABLET | Freq: Every day | ORAL | Status: DC
  Administered 2016-12-16 – 2016-12-19 (×4): 10 mg via ORAL
  Filled 2016-12-16 (×4): qty 1

## 2016-12-16 MED ORDER — SODIUM CHLORIDE 0.9 % IV SOLN: 10.0000 mL/h | Freq: Once | INTRAVENOUS | Status: DC

## 2016-12-16 MED ORDER — GABAPENTIN 300 MG PO CAPS
600.0000 mg | ORAL_CAPSULE | Freq: Every day | ORAL | Status: DC
  Administered 2016-12-16 – 2016-12-19 (×4): 600 mg via ORAL
  Filled 2016-12-16 (×4): qty 2

## 2016-12-16 MED ORDER — ACETAMINOPHEN 325 MG PO TABS
650.0000 mg | ORAL_TABLET | Freq: Three times a day (TID) | ORAL | Status: DC
  Administered 2016-12-16: 650 mg via ORAL
  Filled 2016-12-16: qty 2

## 2016-12-16 MED ORDER — SODIUM CHLORIDE 0.9 % IV SOLN
INTRAVENOUS | Status: DC
  Administered 2016-12-16 – 2016-12-18 (×5): via INTRAVENOUS

## 2016-12-16 MED ORDER — INSULIN ASPART 100 UNIT/ML ~~LOC~~ SOLN: 0.0000 [IU] | Freq: Every day | SUBCUTANEOUS | Status: DC

## 2016-12-16 MED ORDER — TAMSULOSIN HCL 0.4 MG PO CAPS
0.4000 mg | ORAL_CAPSULE | Freq: Every day | ORAL | Status: DC
  Administered 2016-12-16 – 2016-12-20 (×5): 0.4 mg via ORAL
  Filled 2016-12-16 (×5): qty 1

## 2016-12-16 MED ORDER — INSULIN ASPART 100 UNIT/ML ~~LOC~~ SOLN
3.0000 [IU] | Freq: Three times a day (TID) | SUBCUTANEOUS | Status: DC
  Administered 2016-12-17 – 2016-12-20 (×7): 3 [IU] via SUBCUTANEOUS
  Filled 2016-12-16 (×6): qty 3

## 2016-12-16 MED ORDER — ALPRAZOLAM 0.5 MG PO TABS: 0.5000 mg | ORAL_TABLET | Freq: Three times a day (TID) | ORAL | Status: DC | PRN

## 2016-12-16 NOTE — ED Notes (Signed)
2nd type and screen tube sent to lab

## 2016-12-16 NOTE — Telephone Encounter (Signed)
Seems appropriate to triage to ER Will follow up based on findings at ER visit

## 2016-12-16 NOTE — Progress Notes (Signed)
Verified in glucometer that glucose was 115 at 1627 as did not flow across. Will not give sliding scale or meal coverage for this fingersitck.

## 2016-12-16 NOTE — Progress Notes (Signed)
Verified with MD that pt is suppose to be on clear liquids.

## 2016-12-16 NOTE — H&P (Addendum)
[place] [facility] [place] NAME: Randy Mcdaniel    MR#:  [id number]  DATE OF BIRTH:  November 17, 1940  DATE OF ADMISSION:  12/16/2016  PRIMARY CARE PHYSICIAN: Viviana Simpler, MD   REQUESTING/REFERRING PHYSICIAN:   CHIEF COMPLAINT:   Chief Complaint  Patient presents with  . Rectal Bleeding    HISTORY OF PRESENT ILLNESS: Randy Mcdaniel  is a 76 y.o. male with a known history of Diabetes, diabetic neuropathy, chronic renal insufficiency, who presents to the hospital with bright red blood per rectum for the past 4-5 days. Patient denied any dizziness or shortness of breath, however, but according to his wife, he was very short of breath very weak and almost passed out earlier today. He has been having hard stools/constipation for while. He admits of GERD. The feeling in the lower abdomen, cramps. Globin level was found to be low at 7.7, he was hypotensive to 90, hospitalist services were consulted for admission. Patient denies any aspirin or nonsteroidal anti-inflammatory medication use, however, his medication list has Naproxen. A colonoscopy about the year ago in [place] was unremarkable but hemorrhoids, patient had a hemorrhoidal surgery/banding in the past, denies any discomfort due to hemorrhoids.   PAST MEDICAL HISTORY:   Past Medical History:  Diagnosis Date  . Anxiety   . Depression    recurrent and severe  . Diabetes mellitus    Type II with nephropathy  . HLD (hyperlipidemia)   . Hypothyroidism   . Internal hemorrhoid   . Osteoarthritis   . Personal history of colonic adenoma 12/03/2012    PAST SURGICAL HISTORY: Past Surgical History:  Procedure Laterality Date  . COLONOSCOPY    . HEMORRHOID BANDING    . TOTAL KNEE ARTHROPLASTY  11/09   Right-Dr. Clair Gulling ([facility])  . TOTAL KNEE ARTHROPLASTY  6/10   Left-Dr. Clair Gulling ([facility])  . UMBILICAL HERNIA REPAIR  2000    SOCIAL HISTORY:  Social History  Substance Use Topics  . Smoking  status: Never Smoker  . Smokeless tobacco: Former Systems developer    Types: Chew  . Alcohol use No    FAMILY HISTORY:  Family History  Problem Relation Age of Onset  . Heart failure Father   . Diabetes Father   . Depression Father   . Coronary artery disease Father   . Breast cancer Sister   . Stomach cancer Mother   . Lung cancer Sister     ?   . Depression Sister   . Stroke      Paternal side  . Parkinsonism      Cousin  . Colon cancer Neg Hx   . Rectal cancer Neg Hx   . Esophageal cancer Neg Hx   . Colon polyps Neg Hx     DRUG ALLERGIES:  Allergies  Allergen Reactions  . Vytorin [Ezetimibe-Simvastatin]     REACTION: nausea    Review of Systems  Respiratory: Positive for cough and shortness of breath.   Gastrointestinal: Positive for abdominal pain, blood in stool and constipation.  Genitourinary: Negative for frequency.  Neurological: Positive for loss of consciousness and weakness.   , Passing urine MEDICATIONS AT HOME:  Prior to Admission medications   Medication Sig Start Date End Date Taking? Authorizing Provider  acetaminophen (TYLENOL) 650 MG CR tablet Take 650 mg by mouth 3 (three) times daily.    Yes Historical Provider, MD  ALPRAZolam (XANAX) 0.5 MG tablet TAKE 1 TABLET BY MOUTH 3 TIMES DAILY AS NEEDED  10/04/16  Yes Venia Carbon, MD  atorvastatin (LIPITOR) 20 MG tablet TAKE 1 TABLET BY MOUTH ONCE DAILY 11/15/16  Yes Venia Carbon, MD  escitalopram (LEXAPRO) 20 MG tablet TAKE 2 TABLETS BY MOUTH EVERY MORNING Patient taking differently: TAKE 1 TABLET BY MOUTH EVERY MORNING 11/15/16  Yes Venia Carbon, MD  gabapentin (NEURONTIN) 300 MG capsule Take 1-2 capsules (300-600 mg total) by mouth at bedtime. Patient taking differently: Take 600 mg by mouth at bedtime.  11/20/15  Yes Venia Carbon, MD  glipiZIDE (GLUCOTROL) 5 MG tablet TAKE 1 TABLET BY MOUTH TWICE A DAY BEFORE A MEAL 11/15/16  Yes Venia Carbon, MD  levothyroxine (SYNTHROID, LEVOTHROID) 75 MCG  tablet TAKE 1 TABLET BY MOUTH ONCE A DAY 08/30/16  Yes Venia Carbon, MD  lisinopril (PRINIVIL,ZESTRIL) 10 MG tablet TAKE 1 TABLET BY MOUTH ONCE DAILY 10/11/16  Yes Venia Carbon, MD  metFORMIN (GLUCOPHAGE) 500 MG tablet TAKE 1 TABLET BY MOUTH 3 TIMES DAILY 02/15/16  Yes Venia Carbon, MD  methylphenidate (RITALIN) 5 MG tablet Take 1 tablet (5 mg total) by mouth 2 (two) times daily. Fill in about 1 month Patient taking differently: Take 5 mg by mouth daily. Fill in about 1 month 07/06/16  Yes Venia Carbon, MD  naproxen sodium (ANAPROX) 220 MG tablet Take 220-440 mg by mouth at bedtime as needed.   Yes Historical Provider, MD  OLANZapine (ZYPREXA) 10 MG tablet TAKE 1 TABLET BY MOUTH DAILY AT BEDTIME 10/11/16  Yes Venia Carbon, MD  vitamin B-12 (CYANOCOBALAMIN) 1000 MCG tablet Take 1,000 mcg by mouth daily.     Yes Historical Provider, MD  glucose blood test strip Dx: 250.40 03/25/11   Venia Carbon, MD      PHYSICAL EXAMINATION:   VITAL SIGNS: Blood pressure 125/63, pulse 81, temperature 98.6 F (37 C), temperature source Oral, resp. rate 16, height 5\' 11"  (1.803 m), weight 98 kg (216 lb), SpO2 99 %.  GENERAL:  76 y.o.-year-old patient lying in the bed with no acute distress. Pale. Dry oral mucosa.  EYES: Pupils equal, round, reactive to light and accommodation. No scleral icterus. Extraocular muscles intact.  HEENT: Head atraumatic, normocephalic. Oropharynx and nasopharynx clear.  NECK:  Supple, no jugular venous distention. No thyroid enlargement, no tenderness.  LUNGS: Normal breath sounds bilaterally, no wheezing, rales,rhonchi or crepitation. No use of accessory muscles of respiration.  CARDIOVASCULAR: S1, S2 normal. No murmurs, rubs, or gallops.  ABDOMEN: Soft, nontender, nondistended. Bowel sounds present. No organomegaly or mass.  EXTREMITIES: No pedal edema, cyanosis, or clubbing.  NEUROLOGIC: Cranial nerves II through XII are intact. Muscle strength 5/5 in all  extremities. Sensation intact. Gait not checked.  PSYCHIATRIC: The patient is alert and oriented x 3.  SKIN: No obvious rash, lesion, or ulcer.   LABORATORY PANEL:   CBC  Recent Labs Lab 12/16/16 1131  WBC 8.7  HGB 7.7*  HCT 24.0*  PLT 424  MCV 79.3*  MCH 25.3*  MCHC 32.0  RDW 15.0*   ------------------------------------------------------------------------------------------------------------------  Chemistries   Recent Labs Lab 12/16/16 1131  NA 138  K 4.8  CL 106  CO2 23  GLUCOSE 212*  BUN 29*  CREATININE 1.41*  CALCIUM 9.4  AST 44*  ALT 18  ALKPHOS 56  BILITOT 0.5   ------------------------------------------------------------------------------------------------------------------  Cardiac Enzymes No results for input(s): TROPONINI in the last 168 hours. ------------------------------------------------------------------------------------------------------------------  RADIOLOGY: No results found.  EKG: Orders placed or performed in visit on  07/02/08  . Converted CEMR EKG   Gen. 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 Lower GI bleed, etiology is unclear at this time, get the nuclear medicine bleeding scan if recurrent bleeding, gastroenterology consultation #3. Near syncope him a due to volume loss, transfuse packed red blood cells, give IV fluids, follow orthostatic vital signs #4 hypotension, IV fluids were given, continue, transfuse packed red blood cells, following hemoglobin level has improved with IV fluid administration. Hold blood pressure medications #5 diabetes,  continue sliding scale insulin, hold diabetic medications #6 BPH, initiate Flomax   All the records are reviewed and case discussed with ED provider. Management plans discussed with the patient, family and they are in agreement.  CODE STATUS: Code Status History    This patient does not have a recorded code status. Please follow your organizational policy for patients in this situation.    Advance Directive Documentation     Most Recent Value  Type of Advance Directive  Healthcare Power of Attorney, Living will, Out of facility DNR (pink MOST or yellow form)  Pre-existing out of facility DNR order (yellow form or pink MOST form)  -  "MOST" Form in Place?  -       TOTAL TIME TAKING CARE OF THIS PATIENT: 50 minutes.    Theodoro Grist M.D on 12/16/2016 at 2:06 PM  Between 7am to 6pm - Pager - [phone] After 6pm go to [email or web address] - password [email or web address] [facility]  Office  [phone]  CC: Primary care physician; Viviana Simpler, MD

## 2016-12-16 NOTE — ED Triage Notes (Signed)
Pt to ED from home c/o rectal bleeding off and on for several months.  States bleeding is bright red.  Denies pain.  Patient presents pale, chest rise even and unlabored, states feels weak.  Hx of hemorrhoids.

## 2016-12-16 NOTE — Telephone Encounter (Signed)
Patient Name: Randy Mcdaniel  DOB: 1940/12/30    Initial Comment Caller states he has rectal bleeding.   Nurse Assessment  Nurse: Verlin Fester RN, Stanton Kidney Date/Time (Eastern Time): 12/16/2016 8:56:51 AM  Confirm and document reason for call. If symptomatic, describe symptoms. ---Patient states he is having rectal bleeding for months  Does the patient have any new or worsening symptoms? ---Yes  Will a triage be completed? ---Yes  Related visit to physician within the last 2 weeks? ---No  Does the PT have any chronic conditions? (i.e. diabetes, asthma, etc.) ---Yes  List chronic conditions. ---"diabetes, depression"  Is this a behavioral health or substance abuse call? ---No     Guidelines    Guideline Title Affirmed Question Affirmed Notes  Rectal Bleeding SEVERE rectal bleeding (large blood clots; on and off, or constant bleeding)    Final Disposition User   Go to ED Now Verlin Fester, RN, [facility] - ED   Disagree/Comply: Comply

## 2016-12-16 NOTE — Progress Notes (Signed)
Notified Dr. Ara Kussmaul of 2.5 second pause. RN in room with pt at time. VSS, no new orders received will continue to monitor.

## 2016-12-16 NOTE — ED Notes (Signed)
Pt presents with intermittent rectal bleeding x 2-3 months. States he has not sought medical treatment because it stops and then starts again. He called PCP this morning and was told to come to ED. Pt is pale and feels weak, denies SOB. Pt alert & oriented with NAD noted.

## 2016-12-16 NOTE — ED Provider Notes (Signed)
[facility] Emergency Department Provider Note   ____________________________________________    I have reviewed the triage vital signs and the nursing notes.   HISTORY  Chief Complaint Rectal Bleeding     HPI Randy Mcdaniel. is a 76 y.o. male who presents with complaints of rectal bleeding. Patient reports over the last 3 days he has had daily rectal bleeding but has not wanted to be seen because it seems to get better on its own. Today his wife insisted on him coming to the hospital after she got tired of cleaning up the bathroom after 4 episodes. He reports lightheadedness. Denies blood thinners. Has never had rectal bleeding before. No abdominal pain.   Past Medical History:  Diagnosis Date  . Anxiety   . Depression    recurrent and severe  . Diabetes mellitus    Type II with nephropathy  . HLD (hyperlipidemia)   . Hypothyroidism   . Internal hemorrhoid   . Osteoarthritis   . Personal history of colonic adenoma 12/03/2012    Patient Active Problem List   Diagnosis Date Noted  . Prolapsed internal hemorrhoids, grade 3 10/20/2015  . Generalized abdominal pain 09/23/2015  . Advance directive discussed with patient 05/22/2015  . Diarrhea 01/06/2015  . Spinal stenosis, lumbar region, with neurogenic claudication 09/17/2014  . Gait disturbance 01/23/2014  . Diabetes, polyneuropathy ([place]) 11/15/2013  . Personal history of colonic adenoma 12/03/2012  . Routine general medical examination at a health care facility 10/16/2012  . Constipation 01/10/2011  . B12 DEFICIENCY 08/18/2010  . Type 2 diabetes mellitus with neurological manifestations, controlled ([place]) 02/17/2007  . OSTEOARTHRITIS 02/17/2007  . Hypothyroidism 02/15/2007  . Hyperlipemia 02/15/2007  . Panic anxiety syndrome 02/15/2007  . Depression, major, recurrent, in partial remission ([place]) 02/15/2007    Past Surgical History:  Procedure Laterality Date  . COLONOSCOPY    .  HEMORRHOID BANDING    . TOTAL KNEE ARTHROPLASTY  11/09   Right-Dr. Clair Gulling ([facility])  . TOTAL KNEE ARTHROPLASTY  6/10   Left-Dr. Clair Gulling ([facility])  . UMBILICAL HERNIA REPAIR  2000    Prior to Admission medications   Medication Sig Start Date End Date Taking? Authorizing Provider  acetaminophen (TYLENOL) 650 MG CR tablet Take 650 mg by mouth 3 (three) times daily.    Yes Historical Provider, MD  ALPRAZolam Duanne Moron) 0.5 MG tablet TAKE 1 TABLET BY MOUTH 3 TIMES DAILY AS NEEDED 10/04/16  Yes Venia Carbon, MD  atorvastatin (LIPITOR) 20 MG tablet TAKE 1 TABLET BY MOUTH ONCE DAILY 11/15/16  Yes Venia Carbon, MD  escitalopram (LEXAPRO) 20 MG tablet TAKE 2 TABLETS BY MOUTH EVERY MORNING Patient taking differently: TAKE 1 TABLET BY MOUTH EVERY MORNING 11/15/16  Yes Venia Carbon, MD  gabapentin (NEURONTIN) 300 MG capsule Take 1-2 capsules (300-600 mg total) by mouth at bedtime. Patient taking differently: Take 600 mg by mouth at bedtime.  11/20/15  Yes Venia Carbon, MD  glipiZIDE (GLUCOTROL) 5 MG tablet TAKE 1 TABLET BY MOUTH TWICE A DAY BEFORE A MEAL 11/15/16  Yes Venia Carbon, MD  levothyroxine (SYNTHROID, LEVOTHROID) 75 MCG tablet TAKE 1 TABLET BY MOUTH ONCE A DAY 08/30/16  Yes Venia Carbon, MD  lisinopril (PRINIVIL,ZESTRIL) 10 MG tablet TAKE 1 TABLET BY MOUTH ONCE DAILY 10/11/16  Yes Venia Carbon, MD  metFORMIN (GLUCOPHAGE) 500 MG tablet TAKE 1 TABLET BY MOUTH 3 TIMES DAILY 02/15/16  Yes Venia Carbon, MD  methylphenidate (RITALIN)  5 MG tablet Take 1 tablet (5 mg total) by mouth 2 (two) times daily. Fill in about 1 month Patient taking differently: Take 5 mg by mouth daily. Fill in about 1 month 07/06/16  Yes Venia Carbon, MD  naproxen sodium (ANAPROX) 220 MG tablet Take 220-440 mg by mouth at bedtime as needed.   Yes Historical Provider, MD  OLANZapine (ZYPREXA) 10 MG tablet TAKE 1 TABLET BY MOUTH DAILY AT BEDTIME 10/11/16  Yes Venia Carbon, MD  vitamin B-12 (CYANOCOBALAMIN) 1000  MCG tablet Take 1,000 mcg by mouth daily.     Yes Historical Provider, MD  glucose blood test strip Dx: 250.40 03/25/11   Venia Carbon, MD     Allergies Vytorin [ezetimibe-simvastatin]  Family History  Problem Relation Age of Onset  . Heart failure Father   . Diabetes Father   . Depression Father   . Coronary artery disease Father   . Breast cancer Sister   . Stomach cancer Mother   . Lung cancer Sister     ?   . Depression Sister   . Stroke      Paternal side  . Parkinsonism      Cousin  . Colon cancer Neg Hx   . Rectal cancer Neg Hx   . Esophageal cancer Neg Hx   . Colon polyps Neg Hx     Social History Social History  Substance Use Topics  . Smoking status: Never Smoker  . Smokeless tobacco: Former Systems developer    Types: Chew  . Alcohol use No    Review of Systems  Constitutional: No fever/chills Eyes: No visual changes.   Cardiovascular: Denies chest pain. Respiratory: Denies shortness of breath. Gastrointestinal: As above Genitourinary: Negative for dysuria. Musculoskeletal: Negative for back pain. Skin: Negative for rash. Neurological: Negative for headaches   10-point ROS otherwise negative.  ____________________________________________   PHYSICAL EXAM:  VITAL SIGNS: ED Triage Vitals [12/16/16 1127]  Enc Vitals Group     BP 130/63     Pulse Rate 87     Resp 16     Temp 98.6 F (37 C)     Temp Source Oral     SpO2 (!) 7 %     Weight 216 lb (98 kg)     Height 5\' 11"  (1.803 m)     Head Circumference      Peak Flow      Pain Score      Pain Loc      Pain Edu?      Excl. in [place]?     Constitutional: Alert and oriented. Pale  Head: Atraumatic. Nose: No congestion/rhinnorhea. Mouth/Throat: Mucous membranes are moist.   Cardiovascular: Normal rate, regular rhythm. Grossly normal heart sounds.  Good peripheral circulation. Respiratory: Normal respiratory effort.  No retractions. Lungs CTAB. Gastrointestinal: Soft and nontender. No distention.   No CVA tenderness. Genitourinary: deferred Musculoskeletal:.  Warm and well perfused Neurologic:  Normal speech and language. No gross focal neurologic deficits are appreciated.  Skin:  Skin is warm, dry and intact. No rash noted. Psychiatric: Mood and affect are normal. Speech and behavior are normal.  ____________________________________________   LABS (all labs ordered are listed, but only abnormal results are displayed)  Labs Reviewed  COMPREHENSIVE METABOLIC PANEL - Abnormal; Notable for the following:       Result Value   Glucose, Bld 212 (*)    BUN 29 (*)    Creatinine, Ser 1.41 (*)    AST 44 (*)  GFR calc non Af Amer 47 (*)    GFR calc Af Amer 54 (*)    All other components within normal limits  CBC - Abnormal; Notable for the following:    RBC 3.02 (*)    Hemoglobin 7.7 (*)    HCT 24.0 (*)    MCV 79.3 (*)    MCH 25.3 (*)    RDW 15.0 (*)    All other components within normal limits  POC OCCULT BLOOD, ED  TYPE AND SCREEN  ABO/RH  PREPARE RBC (CROSSMATCH)   ____________________________________________  EKG  None ____________________________________________  RADIOLOGY  None ____________________________________________   PROCEDURES  Procedure(s) performed: No    Critical Care performed: yes  CRITICAL CARE Performed by: Lavonia Drafts   Total critical care time:35 minutes  Critical care time was exclusive of separately billable procedures and treating other patients.  Critical care was necessary to treat or prevent imminent or life-threatening deterioration.  Critical care was time spent personally by me on the following activities: development of treatment plan with patient and/or surrogate as well as nursing, discussions with consultants, evaluation of patient's response to treatment, examination of patient, obtaining history from patient or surrogate, ordering and performing treatments and interventions, ordering and review of laboratory  studies, ordering and review of radiographic studies, pulse oximetry and re-evaluation of patient's condition.  ____________________________________________   INITIAL IMPRESSION / ASSESSMENT AND PLAN / ED COURSE  Pertinent labs & imaging results that were available during my care of the patient were reviewed by me and considered in my medical decision making (see chart for details).  Patient presents with hypotension and reports of rectal bleeding. His hemoglobin is 7.7, this is down from the last value that I have access to 13.7. IV fluids started immediately, consented the patient for PRBCs and transfusion ordered.  Patient has no abdominal pain. His blood pressure has improved after IV fluids. Discussed with admitting physician for further management    ____________________________________________   FINAL CLINICAL IMPRESSION(S) / ED DIAGNOSES  Final diagnoses:  Acute GI bleeding      NEW MEDICATIONS STARTED DURING THIS VISIT:  New Prescriptions   No medications on file     Note:  This document was prepared using Dragon voice recognition software and may include unintentional dictation errors.    Lavonia Drafts, MD 12/16/16 1331

## 2016-12-17 ENCOUNTER — Encounter
Admission: EM | Disposition: A | Discharge status: Home Health | Payer: Self-pay | Source: Home / Self Care | Attending: Specialist

## 2016-12-17 ENCOUNTER — Inpatient Hospital Stay: Payer: Medicare Other | Admitting: Anesthesiology

## 2016-12-17 HISTORY — PX: ESOPHAGOGASTRODUODENOSCOPY (EGD) WITH PROPOFOL: SHX5813

## 2016-12-17 HISTORY — PX: FLEXIBLE SIGMOIDOSCOPY: SHX5431

## 2016-12-17 LAB — BASIC METABOLIC PANEL WITH GFR
Anion gap: 6 (ref 5–15)
BUN: 22 mg/dL — ABNORMAL HIGH (ref 6–20)
CO2: 23 mmol/L (ref 22–32)
Calcium: 8.8 mg/dL — ABNORMAL LOW (ref 8.9–10.3)
Chloride: 108 mmol/L (ref 101–111)
Creatinine, Ser: 1.19 mg/dL (ref 0.61–1.24)
GFR calc Af Amer: >60 mL/min
GFR calc non Af Amer: 57 mL/min — ABNORMAL LOW
Glucose, Bld: 140 mg/dL — ABNORMAL HIGH (ref 65–99)
Potassium: 4.7 mmol/L (ref 3.5–5.1)
Sodium: 137 mmol/L (ref 135–145)

## 2016-12-17 LAB — BPAM RBC
BLOOD PRODUCT EXPIRATION DATE: 201804242359
BLOOD PRODUCT EXPIRATION DATE: 201805162359
Blood Product Expiration Date: 201805162359
ISSUE DATE / TIME: 201804201349
ISSUE DATE / TIME: 201804201556
ISSUE DATE / TIME: 201804201743
UNIT TYPE AND RH: 5100
UNIT TYPE AND RH: 600
Unit Type and Rh: 5100

## 2016-12-17 LAB — HEMOGLOBIN: Hemoglobin: 8.7 g/dL — ABNORMAL LOW (ref 13.0–18.0)

## 2016-12-17 LAB — HEMOGLOBIN AND HEMATOCRIT, BLOOD
HCT: 25.8 % — ABNORMAL LOW (ref 40.0–52.0)
HEMOGLOBIN: 8.5 g/dL — AB (ref 13.0–18.0)

## 2016-12-17 LAB — TYPE AND SCREEN
ABO/RH(D): A POS
Antibody Screen: NEGATIVE
UNIT DIVISION: 0
Unit division: 0
Unit division: 0

## 2016-12-17 LAB — CBC
HCT: 26.8 % — ABNORMAL LOW (ref 40.0–52.0)
Hemoglobin: 8.8 g/dL — ABNORMAL LOW (ref 13.0–18.0)
MCH: 26.4 pg (ref 26.0–34.0)
MCHC: 32.7 g/dL (ref 32.0–36.0)
MCV: 80.9 fL (ref 80.0–100.0)
Platelets: 319 10*3/uL (ref 150–440)
RBC: 3.32 MIL/uL — ABNORMAL LOW (ref 4.40–5.90)
RDW: 15 % — ABNORMAL HIGH (ref 11.5–14.5)
WBC: 6.2 10*3/uL (ref 3.8–10.6)

## 2016-12-17 LAB — GLUCOSE, CAPILLARY
GLUCOSE-CAPILLARY: 154 mg/dL — AB (ref 65–99)
GLUCOSE-CAPILLARY: 115 mg/dL — AB (ref 65–99)
Glucose-Capillary: 132 mg/dL — ABNORMAL HIGH (ref 65–99)
Glucose-Capillary: 111 mg/dL — ABNORMAL HIGH (ref 65–99)

## 2016-12-17 LAB — HEMOGLOBIN A1C
Hgb A1c MFr Bld: 7.3 % — ABNORMAL HIGH (ref 4.8–5.6)
Mean Plasma Glucose: 163 mg/dL

## 2016-12-17 SURGERY — ESOPHAGOGASTRODUODENOSCOPY (EGD) WITH PROPOFOL
Anesthesia: General

## 2016-12-17 SURGERY — EGD (ESOPHAGOGASTRODUODENOSCOPY)
Anesthesia: Monitor Anesthesia Care

## 2016-12-17 MED ORDER — BUTAMBEN-TETRACAINE-BENZOCAINE 2-2-14 % EX AERO
1.0000 | INHALATION_SPRAY | Freq: Once | CUTANEOUS | Status: AC
  Administered 2016-12-17: 1 via TOPICAL

## 2016-12-17 MED ORDER — PROPOFOL 10 MG/ML IV BOLUS
INTRAVENOUS | Status: DC | PRN
  Administered 2016-12-17 (×4): 10 mg via INTRAVENOUS

## 2016-12-17 MED ORDER — SODIUM CHLORIDE 0.9 % IV SOLN
INTRAVENOUS | Status: DC | PRN
  Administered 2016-12-17: 15:00:00 via INTRAVENOUS

## 2016-12-17 MED ORDER — ONDANSETRON HCL 4 MG/2ML IJ SOLN: 4.0000 mg | Freq: Once | INTRAMUSCULAR | Status: DC | PRN

## 2016-12-17 MED ORDER — ACETAMINOPHEN 325 MG PO TABS
650.0000 mg | ORAL_TABLET | ORAL | Status: DC | PRN
  Administered 2016-12-17 – 2016-12-19 (×5): 650 mg via ORAL
  Filled 2016-12-17 (×5): qty 2

## 2016-12-17 MED ORDER — POLYETHYLENE GLYCOL 3350 17 G PO PACK
17.0000 g | PACK | Freq: Two times a day (BID) | ORAL | Status: DC
  Administered 2016-12-17 – 2016-12-20 (×5): 17 g via ORAL
  Filled 2016-12-17 (×5): qty 1

## 2016-12-17 MED ORDER — SODIUM CHLORIDE 0.9 % IV SOLN
INTRAVENOUS | Status: DC
  Administered 2016-12-17: 15:00:00 via INTRAVENOUS

## 2016-12-17 MED ORDER — MIDAZOLAM HCL 2 MG/2ML IJ SOLN: INTRAMUSCULAR | Status: DC | PRN

## 2016-12-17 MED ORDER — FENTANYL CITRATE (PF) 100 MCG/2ML IJ SOLN: 25.0000 ug | INTRAMUSCULAR | Status: DC | PRN

## 2016-12-17 MED ORDER — KETAMINE HCL 10 MG/ML IJ SOLN
INTRAMUSCULAR | Status: DC | PRN
  Administered 2016-12-17 (×3): 10 mg via INTRAVENOUS

## 2016-12-17 MED ORDER — PROPOFOL 10 MG/ML IV BOLUS
INTRAVENOUS | Status: AC
  Filled 2016-12-17: qty 40

## 2016-12-17 MED ORDER — PANTOPRAZOLE SODIUM 40 MG IV SOLR
40.0000 mg | INTRAVENOUS | Status: DC
  Administered 2016-12-18 – 2016-12-20 (×3): 40 mg via INTRAVENOUS
  Filled 2016-12-17 (×3): qty 40

## 2016-12-17 NOTE — Progress Notes (Addendum)
Spoke with Dr. Michail Sermon about orders for consent. Per MD get orders for consent for EGD and flexible sigmoidoscopy for gastrointestinal bleed. MD has spoke with pt about what he will be having done.

## 2016-12-17 NOTE — Brief Op Note (Signed)
EGD unrevealing for a source of bleeding. Solid brown stool seen in rectum preventing visualization. Suspect bleeding was due to a diverticular source but hemorrhoidal bleeding also possible. Also, may have a stercoral ulcer from constipation causing the bleeding. Clear liquid diet and advance as tolerated. Miralax BID for 3 days. Hold off on additional GI invasive procedures. Supportive care. Will follow.

## 2016-12-17 NOTE — Op Note (Signed)
[facility] Gastroenterology Patient Name: Randy Mcdaniel Procedure Date: 12/17/2016 2:35 PM MRN: [id number] Account #: [id number] Date of Birth: 1941/08/24 Admit Type: Inpatient Age: 76 Room: [facility] ENDO ROOM 4 Gender: Male Note Status: Finalized Procedure:            Upper GI endoscopy Indications:          Active gastrointestinal bleeding, Hematochezia Providers:            Lear Ng, MD Referring MD:         Venia Carbon (Referring MD) Medicines:            Propofol per Anesthesia, Monitored Anesthesia Care Complications:        No immediate complications. Procedure:            Pre-Anesthesia Assessment:                       - Prior to the procedure, a History and Physical was                        performed, and patient medications and allergies were                        reviewed. The patient's tolerance of previous                        anesthesia was also reviewed. The risks and benefits of                        the procedure and the sedation options and risks were                        discussed with the patient. All questions were                        answered, and informed consent was obtained. Prior                        Anticoagulants: The patient has taken no previous                        anticoagulant or antiplatelet agents. ASA Grade                        Assessment: III - A patient with severe systemic                        disease. After reviewing the risks and benefits, the                        patient was deemed in satisfactory condition to undergo                        the procedure.                       After obtaining informed consent, the endoscope was                        passed under direct vision. Throughout the procedure,  the patient's blood pressure, pulse, and oxygen                        saturations were monitored continuously. The Endoscope                        was introduced  through the mouth, and advanced to the                        second part of duodenum. The upper GI endoscopy was                        accomplished without difficulty. The patient tolerated                        the procedure well. Findings:      The examined esophagus was normal.      The Z-line was regular and was found 40 cm from the incisors.      The entire examined stomach was normal.      The cardia and gastric fundus were normal on retroflexion.      A few erosions without bleeding were found in the duodenal bulb.      The exam of the duodenum was otherwise normal. Impression:           - Normal esophagus.                       - Z-line regular, 40 cm from the incisors.                       - Normal stomach.                       - Duodenal erosions without bleeding.                       - No specimens collected. Recommendation:       - Patient has a contact number available for                        emergencies. The signs and symptoms of potential                        delayed complications were discussed with the patient.                        Return to normal activities tomorrow. Written discharge                        instructions were provided to the patient.                       - Observe patient's clinical course.                       - Proceed with flexible sigmoidoscopy. Procedure Code(s):    --- Professional ---                       [phone], Esophagogastroduodenoscopy, flexible, transoral;  diagnostic, including collection of specimen(s) by                        brushing or washing, when performed (separate procedure) Diagnosis Code(s):    --- Professional ---                       K92.2, Gastrointestinal hemorrhage, unspecified                       K26.9, Duodenal ulcer, unspecified as acute or chronic,                        without hemorrhage or perforation                       K92.1, Melena (includes Hematochezia) CPT copyright  2016 American Medical Association. All rights reserved. The codes documented in this report are preliminary and upon coder review may  be revised to meet current compliance requirements. Lear Ng, MD 12/17/2016 3:20:55 PM This report has been signed electronically. Number of Addenda: 0 Note Initiated On: 12/17/2016 2:35 PM      [facility]

## 2016-12-17 NOTE — Progress Notes (Signed)
[place] [facility] [place] NAME: Randy Mcdaniel    MR#:  [id number]  DATE OF BIRTH:  [birth year]  SUBJECTIVE:   Patient here with rectal bleeding and a lower GI bleed. Had 2 episodes of moderate to large rectal bleeding today. Hemoglobin stable. Cut 2 units of packed blood cells yesterday. Hemodynamically stable. Seen by gastroenterology, plan for upper GI endoscopy and flexible sigmoidoscopy today.  REVIEW OF SYSTEMS:    Review of Systems  Constitutional: Negative for chills and fever.  HENT: Negative for congestion and tinnitus.   Eyes: Negative for blurred vision and double vision.  Respiratory: Negative for cough, shortness of breath and wheezing.   Cardiovascular: Negative for chest pain, orthopnea and PND.  Gastrointestinal: Positive for blood in stool. Negative for abdominal pain, diarrhea, nausea and vomiting.  Genitourinary: Negative for dysuria and hematuria.  Neurological: Negative for dizziness, sensory change and focal weakness.  All other systems reviewed and are negative.   Nutrition: NPO Tolerating Diet: No Tolerating PT: Await Eval.    DRUG ALLERGIES:   Allergies  Allergen Reactions  . Vytorin [Ezetimibe-Simvastatin]     REACTION: nausea    VITALS:  Blood pressure (!) 124/57, pulse 69, temperature 98.1 F (36.7 C), temperature source Oral, resp. rate 18, height 5\' 11"  (1.803 m), weight 98.1 kg (216 lb 4.8 oz), SpO2 98 %.  PHYSICAL EXAMINATION:   Physical Exam  GENERAL:  76 y.o.-year-old pale appearing patient lying in the bed in no acute distress.  EYES: Pupils equal, round, reactive to light and accommodation. No scleral icterus. Extraocular muscles intact.  HEENT: Head atraumatic, normocephalic. Oropharynx and nasopharynx clear.  NECK:  Supple, no jugular venous distention. No thyroid enlargement, no tenderness.  LUNGS: Normal breath sounds bilaterally, no wheezing, rales, rhonchi. No use of accessory muscles of  respiration.  CARDIOVASCULAR: S1, S2 normal. No murmurs, rubs, or gallops.  ABDOMEN: Soft, nontender, nondistended. Bowel sounds present. No organomegaly or mass.  EXTREMITIES: No cyanosis, clubbing or edema b/l.    NEUROLOGIC: Cranial nerves II through XII are intact. No focal Motor or sensory deficits b/l.   PSYCHIATRIC: The patient is alert and oriented x 3.  SKIN: No obvious rash, lesion, or ulcer.    LABORATORY PANEL:   CBC  Recent Labs Lab 12/17/16 0609 12/17/16 0800  WBC 6.2  --   HGB 8.8* 8.5*  HCT 26.8* 25.8*  PLT 319  --    ------------------------------------------------------------------------------------------------------------------  Chemistries   Recent Labs Lab 12/16/16 1131 12/17/16 0609  NA 138 137  K 4.8 4.7  CL 106 108  CO2 23 23  GLUCOSE 212* 140*  BUN 29* 22*  CREATININE 1.41* 1.19  CALCIUM 9.4 8.8*  AST 44*  --   ALT 18  --   ALKPHOS 56  --   BILITOT 0.5  --    ------------------------------------------------------------------------------------------------------------------  Cardiac Enzymes No results for input(s): TROPONINI in the last 168 hours. ------------------------------------------------------------------------------------------------------------------  RADIOLOGY:  No results found.   ASSESSMENT AND PLAN:   76 year old male with past medical history of osteoarthritis, hypertension, diabetes, hypothyroidism, history of internal hemorrhoids, anxiety/depression who presented to the hospital due to rectal bleeding.   1. Lower GI bleed-this is at the cause of patient's rectal bleeding. Patient has a history of internal hemorrhoids. -Hemoglobin stable but patient has received 2 units of packed blood cells since yesterday. Continue to follow serial hemoglobin. -Seen by gastroenterology, plan for upper GI endoscopy/flexible sigmoidoscopy. Discussed with GI about ordering a bleeding  scan but they would like to hold off for now.  2.  Acute blood loss anemia-secondary to GI bleed. Patient has been transfused 2 units of blood cells. Hemoglobin stable. -Follow serial hemoglobins.  3. Diabetes type 2 without complication-continue NovoLog sliding scale.  4. GERD-continue Protonix.  5. Hypothyroidism-continue Synthroid.  6. Neuropathy-continue gabapentin.  7. Anxiety/depression-continue Xanax, Lexapro.   All the records are reviewed and case discussed with Care Management/Social Worker. Management plans discussed with the patient, family and they are in agreement.  CODE STATUS: Full Code  DVT Prophylaxis: Ted's & SCD's   TOTAL TIME TAKING CARE OF THIS PATIENT: 30 minutes.   POSSIBLE D/C IN 2-3 DAYS, DEPENDING ON CLINICAL CONDITION.   Henreitta Leber M.D on 12/17/2016 at 1:23 PM  Between 7am to 6pm - Pager - [phone]  After 6pm go to [email or web address] - Proofreader  [facility]  Office  [phone]  CC: Primary care physician; Viviana Simpler, MD

## 2016-12-17 NOTE — Consult Note (Signed)
Referring Provider: Dr. Verdell Carmine Primary Care Physician:  Viviana Simpler, MD Primary Gastroenterologist:  Althia Forts  Reason for Consultation:  GI bleed  HPI: Randy Mcdaniel. is a 76 y.o. male with a history of diverticulosis (left-sided diverticulosis and internal hemorrhoids seen on colonoscopy in 2/17 by Dr. Carlean Purl) seen for a consult due to rectal bleeding. Starting 5 days ago has been having 1-2 episodes of BRBPR with black solid stools. Felt dizzy yesterday. Mild abdominal cramping across his abdomen that has been occurring this week. Wife reports that for the past few months he will have one episode of rectal bleeding each time. Denies vomiting, hematemesis, weight loss. Denies iron pills or Pepto-Bismol. Reports hemorrhoidal band ligation in 2017. Had a large episode of red blood from his rectum this morning without stool. Hgb 7.7 on admit yesterday and transfused 2 U PRBCs. Hgb 8.8 this morning.  Past Medical History:  Diagnosis Date  . Anxiety   . Depression    recurrent and severe  . Diabetes mellitus    Type II with nephropathy  . HLD (hyperlipidemia)   . Hypothyroidism   . Internal hemorrhoid   . Osteoarthritis   . Personal history of colonic adenoma 12/03/2012    Past Surgical History:  Procedure Laterality Date  . COLONOSCOPY    . HEMORRHOID BANDING    . TOTAL KNEE ARTHROPLASTY  11/09   Right-Dr. Clair Gulling ([facility])  . TOTAL KNEE ARTHROPLASTY  6/10   Left-Dr. Clair Gulling ([facility])  . UMBILICAL HERNIA REPAIR  2000    Prior to Admission medications   Medication Sig Start Date End Date Taking? Authorizing Provider  acetaminophen (TYLENOL) 650 MG CR tablet Take 650 mg by mouth 3 (three) times daily.    Yes Historical Provider, MD  ALPRAZolam Duanne Moron) 0.5 MG tablet TAKE 1 TABLET BY MOUTH 3 TIMES DAILY AS NEEDED 10/04/16  Yes Venia Carbon, MD  atorvastatin (LIPITOR) 20 MG tablet TAKE 1 TABLET BY MOUTH ONCE DAILY 11/15/16  Yes Venia Carbon, MD  escitalopram (LEXAPRO) 20 MG  tablet TAKE 2 TABLETS BY MOUTH EVERY MORNING Patient taking differently: TAKE 1 TABLET BY MOUTH EVERY MORNING 11/15/16  Yes Venia Carbon, MD  gabapentin (NEURONTIN) 300 MG capsule Take 1-2 capsules (300-600 mg total) by mouth at bedtime. Patient taking differently: Take 600 mg by mouth at bedtime.  11/20/15  Yes Venia Carbon, MD  glipiZIDE (GLUCOTROL) 5 MG tablet TAKE 1 TABLET BY MOUTH TWICE A DAY BEFORE A MEAL 11/15/16  Yes Venia Carbon, MD  levothyroxine (SYNTHROID, LEVOTHROID) 75 MCG tablet TAKE 1 TABLET BY MOUTH ONCE A DAY 08/30/16  Yes Venia Carbon, MD  lisinopril (PRINIVIL,ZESTRIL) 10 MG tablet TAKE 1 TABLET BY MOUTH ONCE DAILY 10/11/16  Yes Venia Carbon, MD  metFORMIN (GLUCOPHAGE) 500 MG tablet TAKE 1 TABLET BY MOUTH 3 TIMES DAILY 02/15/16  Yes Venia Carbon, MD  methylphenidate (RITALIN) 5 MG tablet Take 1 tablet (5 mg total) by mouth 2 (two) times daily. Fill in about 1 month Patient taking differently: Take 5 mg by mouth daily. Fill in about 1 month 07/06/16  Yes Venia Carbon, MD  naproxen sodium (ANAPROX) 220 MG tablet Take 220-440 mg by mouth at bedtime as needed.   Yes Historical Provider, MD  OLANZapine (ZYPREXA) 10 MG tablet TAKE 1 TABLET BY MOUTH DAILY AT BEDTIME 10/11/16  Yes Venia Carbon, MD  vitamin B-12 (CYANOCOBALAMIN) 1000 MCG tablet Take 1,000 mcg by mouth daily.  Yes Historical Provider, MD  glucose blood test strip Dx: 250.40 03/25/11   Venia Carbon, MD    Scheduled Meds: . acetaminophen  650 mg Oral TID  . atorvastatin  20 mg Oral Daily  . escitalopram  20 mg Oral q morning - 10a  . gabapentin  600 mg Oral QHS  . insulin aspart  0-5 Units Subcutaneous QHS  . insulin aspart  0-9 Units Subcutaneous TID WC  . insulin aspart  3 Units Subcutaneous TID WC  . levothyroxine  75 mcg Oral QAC breakfast  . OLANZapine  10 mg Oral QHS  . pantoprazole (PROTONIX) IV  40 mg Intravenous Q12H  . sodium chloride flush  3 mL Intravenous Q12H  . tamsulosin   0.4 mg Oral Daily  . vitamin B-12  1,000 mcg Oral Daily   Continuous Infusions: . sodium chloride 100 mL/hr at 12/17/16 0925   PRN Meds:.ALPRAZolam  Allergies as of 12/16/2016 - Review Complete 12/16/2016  Allergen Reaction Noted  . Vytorin [ezetimibe-simvastatin]  02/15/2007    Family History  Problem Relation Age of Onset  . Heart failure Father   . Diabetes Father   . Depression Father   . Coronary artery disease Father   . Breast cancer Sister   . Lung cancer Sister   . Depression Sister   . Stomach cancer Mother   . Stroke      Paternal side  . Parkinsonism      Cousin  . Colon cancer Neg Hx   . Rectal cancer Neg Hx   . Esophageal cancer Neg Hx   . Colon polyps Neg Hx     Social History   Social History  . Marital status: Married    Spouse name: N/A  . Number of children: 2  . Years of education: N/A   Occupational History  . Retired    Social History Main Topics  . Smoking status: Never Smoker  . Smokeless tobacco: Former Systems developer    Types: Chew  . Alcohol use No  . Drug use: No  . Sexual activity: Not on file   Other Topics Concern  . Not on file   Social History Narrative   Has living will.   No formal health care POA---requests wife to be   Would accept resuscitation attempts   No tube feeds if cognitively unaware    Review of Systems: All negative except as stated above in HPI.  Physical Exam: Vital signs: Vitals:   12/17/16 0426 12/17/16 0730  BP: (!) 151/65 (!) 150/66  Pulse: 85 81  Resp: 20   Temp: 97.5 F (36.4 C)    Last BM Date: 12/17/16 General:   Lethargic, elderly, Well-developed, well-nourished, pleasant and cooperative in NAD HEENT: anicteric sclera, oropharynx clear Neck: supple, nontender Lungs:  Clear throughout to auscultation.   No wheezes, crackles, or rhonchi. No acute distress. Heart:  Regular rate and rhythm; no murmurs, clicks, rubs,  or gallops. Abdomen: soft, nontender, nondistended, +BS  Rectal:   Deferred Ext: no edema  GI:  Lab Results:  Recent Labs  12/16/16 1131  12/16/16 2217 12/17/16 0609 12/17/16 0800  WBC 8.7  --   --  6.2  --   HGB 7.7*  < > 8.4* 8.8* 8.5*  HCT 24.0*  --   --  26.8* 25.8*  PLT 424  --   --  319  --   < > = values in this interval not displayed. BMET  Recent Labs  12/16/16 1131  12/17/16 0609  NA 138 137  K 4.8 4.7  CL 106 108  CO2 23 23  GLUCOSE 212* 140*  BUN 29* 22*  CREATININE 1.41* 1.19  CALCIUM 9.4 8.8*   LFT  Recent Labs  12/16/16 1131  PROT 7.6  ALBUMIN 4.2  AST 44*  ALT 18  ALKPHOS 56  BILITOT 0.5   PT/INR No results for input(s): LABPROT, INR in the last 72 hours.   Studies/Results: No results found.  Impression/Plan: 76 yo with rectal bleeding that is likely a diverticular source but with the black stools need to check for an upper GI tract source such as peptic ulcer disease. Doubt colon malignancy with colonoscopy done about a year ago. Will do an EGD and flex sig today. Continue Protonix 40 mg IV Q 12 hours.    LOS: 1 day   [place] C.  12/17/2016, 9:39 AM

## 2016-12-17 NOTE — Op Note (Signed)
[facility] Gastroenterology Patient Name: Randy Mcdaniel Procedure Date: 12/17/2016 2:33 PM MRN: [id number] Account #: [id number] Date of Birth: Aug 05, 1941 Admit Type: Inpatient Age: 76 Room: [facility] ENDO ROOM 4 Gender: Male Note Status: Finalized Procedure:            Flexible Sigmoidoscopy Indications:          Hematochezia Providers:            Lear Ng, MD Referring MD:         Venia Carbon (Referring MD) Medicines:            Propofol per Anesthesia, Monitored Anesthesia Care Complications:        No immediate complications. Procedure:            Pre-Anesthesia Assessment:                       - Prior to the procedure, a History and Physical was                        performed, and patient medications and allergies were                        reviewed. The patient's tolerance of previous                        anesthesia was also reviewed. The risks and benefits of                        the procedure and the sedation options and risks were                        discussed with the patient. All questions were                        answered, and informed consent was obtained. Prior                        Anticoagulants: The patient has taken no previous                        anticoagulant or antiplatelet agents. ASA Grade                        Assessment: III - A patient with severe systemic                        disease. After reviewing the risks and benefits, the                        patient was deemed in satisfactory condition to undergo                        the procedure.                       After obtaining informed consent, the scope was passed                        under direct vision. The Endoscope was introduced  through the anus and advanced to the the sigmoid colon.                        The flexible sigmoidoscopy was performed with                        difficulty due to poor endoscopic  visualization. The                        patient tolerated the procedure well. The quality of                        the bowel preparation was an unprepped procedure. Findings:      The perianal and digital rectal examinations were normal.      Internal hemorrhoids were found during endoscopy. The hemorrhoids were       small and Grade I (internal hemorrhoids that do not prolapse).      Copious quantities of solid stool was found in the rectum and in the       recto-sigmoid colon, precluding visualization. Impression:           - Suspect diverticular bleeding (no signs of active                        bleeding on unprepped sigmoidoscopy; visualization                        precluded by solid stool).                       - Internal hemorrhoids.                       - Stool in the rectum and in the recto-sigmoid colon.                       - No specimens collected. Recommendation:       - Advance diet as tolerated and clear liquid diet.                       - - Follow clinical course for signs of ongoing                        bleeding. Procedure Code(s):    --- Professional ---                       [phone], Sigmoidoscopy, flexible; diagnostic, including                        collection of specimen(s) by brushing or washing, when                        performed (separate procedure) Diagnosis Code(s):    --- Professional ---                       K92.1, Melena (includes Hematochezia)                       K64.0, First degree hemorrhoids CPT copyright 2016 American Medical Association. All rights reserved. The codes documented in this report are preliminary  and upon coder review may  be revised to meet current compliance requirements. Lear Ng, MD 12/17/2016 3:27:24 PM This report has been signed electronically. Number of Addenda: 0 Note Initiated On: 12/17/2016 2:33 PM Total Procedure Duration: 0 hours 2 minutes 12 seconds       [facility]

## 2016-12-17 NOTE — Progress Notes (Signed)
Per Dr. Verdell Carmine okay to hold sliding scale insulin as pt has yet to receive insulin while he has been here and holding off to let him eat breakfast until GI comes to see him.

## 2016-12-17 NOTE — Anesthesia Preprocedure Evaluation (Signed)
Anesthesia Evaluation  Patient identified by MRN, date of birth, ID band Patient awake    Reviewed: Allergy & Precautions, NPO status , Patient's Chart, lab work & pertinent test results  History of Anesthesia Complications Negative for: history of anesthetic complications  Airway Mallampati: II       Dental  (+) Upper Dentures, Missing, Chipped   Pulmonary neg pulmonary ROS,           Cardiovascular negative cardio ROS       Neuro/Psych Anxiety Depression  Neuromuscular disease (peripheral neuropathy)    GI/Hepatic negative GI ROS, Neg liver ROS,   Endo/Other  diabetes, Type 2, Oral Hypoglycemic AgentsHypothyroidism   Renal/GU negative Renal ROS     Musculoskeletal   Abdominal   Peds  Hematology  (+) anemia ,   Anesthesia Other Findings   Reproductive/Obstetrics                             Anesthesia Physical Anesthesia Plan  ASA: III and emergent  Anesthesia Plan: General   Post-op Pain Management:    Induction: Intravenous  Airway Management Planned: Nasal Cannula  Additional Equipment:   Intra-op Plan:   Post-operative Plan:   Informed Consent: I have reviewed the patients History and Physical, chart, labs and discussed the procedure including the risks, benefits and alternatives for the proposed anesthesia with the patient or authorized representative who has indicated his/her understanding and acceptance.     Plan Discussed with:   Anesthesia Plan Comments:         Anesthesia Quick Evaluation

## 2016-12-17 NOTE — Anesthesia Postprocedure Evaluation (Signed)
Anesthesia Post Note  Patient: Randy Mcdaniel.  Procedure(s) Performed: Procedure(s) (LRB): ESOPHAGOGASTRODUODENOSCOPY (EGD) WITH PROPOFOL (N/A) FLEXIBLE SIGMOIDOSCOPY (N/A)  Patient location during evaluation: PACU Anesthesia Type: General Level of consciousness: awake and alert Pain management: pain level controlled Vital Signs Assessment: post-procedure vital signs reviewed and stable Respiratory status: spontaneous breathing and respiratory function stable Cardiovascular status: stable Anesthetic complications: no     Last Vitals:  Vitals:   12/17/16 1514 12/17/16 1515  BP:  (!) 156/81  Pulse: 67 79  Resp: 18 19  Temp: 36.9 C     Last Pain:  Vitals:   12/17/16 1430  TempSrc: Oral  PainSc:                  KEPHART,WILLIAM K

## 2016-12-17 NOTE — Transfer of Care (Signed)
Immediate Anesthesia Transfer of Care Note  Patient: Randy Mcdaniel.  Procedure(s) Performed: Procedure(s): ESOPHAGOGASTRODUODENOSCOPY (EGD) WITH PROPOFOL (N/A) FLEXIBLE SIGMOIDOSCOPY (N/A)  Patient Location: PACU  Anesthesia Type:General  Level of Consciousness: awake, alert  and oriented  Airway & Oxygen Therapy: Patient Spontanous Breathing and Patient connected to face mask oxygen  Post-op Assessment: Report given to RN and Post -op Vital signs reviewed and stable  Post vital signs: Reviewed and stable  Last Vitals:  Vitals:   12/17/16 1209 12/17/16 1430  BP: (!) 124/57 123/77  Pulse: 69 92  Resp: 18 20  Temp: 36.7 C 36.7 C    Last Pain:  Vitals:   12/17/16 1430  TempSrc: Oral  PainSc:       Patients Stated Pain Goal: 0 (48/88/91 6945)  Complications: No apparent anesthesia complications

## 2016-12-17 NOTE — Anesthesia Post-op Follow-up Note (Cosign Needed)
Anesthesia QCDR form completed.        

## 2016-12-17 NOTE — Progress Notes (Signed)
Notified Dr. Verdell Carmine that pt had a large bloody BM this morning. Verified that GI will be coming to see pt today. VSS. Per MD place order for stat hemoglobin and then may need to do something further once results are back. Will continue to monitor.

## 2016-12-18 LAB — GLUCOSE, CAPILLARY
Glucose-Capillary: 133 mg/dL — ABNORMAL HIGH (ref 65–99)
Glucose-Capillary: 163 mg/dL — ABNORMAL HIGH (ref 65–99)
Glucose-Capillary: 181 mg/dL — ABNORMAL HIGH (ref 65–99)
Glucose-Capillary: 138 mg/dL — ABNORMAL HIGH (ref 65–99)

## 2016-12-18 NOTE — Progress Notes (Signed)
[place] [facility] [place] NAME: Randy Mcdaniel    MR#:  [id number]  DATE OF BIRTH:  1940/10/27  SUBJECTIVE:   Patient here with rectal bleeding and a lower GI bleed. s/p Endoscopy showing no Acute abnormality, flexible sigmoidoscopy showing internal hemorrhoids but no stigmata of acute bleeding. No abdominal pain, and no acute bleeding today. Daughter at bedside.  REVIEW OF SYSTEMS:    Review of Systems  Constitutional: Negative for chills and fever.  HENT: Negative for congestion and tinnitus.   Eyes: Negative for blurred vision and double vision.  Respiratory: Negative for cough, shortness of breath and wheezing.   Cardiovascular: Negative for chest pain, orthopnea and PND.  Gastrointestinal: Positive for blood in stool. Negative for abdominal pain, diarrhea, nausea and vomiting.  Genitourinary: Negative for dysuria and hematuria.  Neurological: Negative for dizziness, sensory change and focal weakness.  All other systems reviewed and are negative.   Nutrition: Heart healthy Tolerating Diet: Yes Tolerating PT: Ambulatory.    DRUG ALLERGIES:   Allergies  Allergen Reactions  . Vytorin [Ezetimibe-Simvastatin]     REACTION: nausea    VITALS:  Blood pressure (!) 157/87, pulse (!) 113, temperature 97.5 F (36.4 C), temperature source Oral, resp. rate 18, height 5\' 11"  (1.803 m), weight 95.1 kg (209 lb 9.6 oz), SpO2 96 %.  PHYSICAL EXAMINATION:   Physical Exam  GENERAL:  76 y.o.-year-old pale appearing patient lying in the bed in no acute distress.  EYES: Pupils equal, round, reactive to light and accommodation. No scleral icterus. Extraocular muscles intact.  HEENT: Head atraumatic, normocephalic. Oropharynx and nasopharynx clear.  NECK:  Supple, no jugular venous distention. No thyroid enlargement, no tenderness.  LUNGS: Normal breath sounds bilaterally, no wheezing, rales, rhonchi. No use of accessory muscles of respiration.   CARDIOVASCULAR: S1, S2 normal. No murmurs, rubs, or gallops.  ABDOMEN: Soft, nontender, nondistended. Bowel sounds present. No organomegaly or mass.  EXTREMITIES: No cyanosis, clubbing or edema b/l.    NEUROLOGIC: Cranial nerves II through XII are intact. No focal Motor or sensory deficits b/l.   PSYCHIATRIC: The patient is alert and oriented x 3.  SKIN: No obvious rash, lesion, or ulcer.    LABORATORY PANEL:   CBC  Recent Labs Lab 12/17/16 0609 12/17/16 0800 12/17/16 1404  WBC 6.2  --   --   HGB 8.8* 8.5* 8.7*  HCT 26.8* 25.8*  --   PLT 319  --   --    ------------------------------------------------------------------------------------------------------------------  Chemistries   Recent Labs Lab 12/16/16 1131 12/17/16 0609  NA 138 137  K 4.8 4.7  CL 106 108  CO2 23 23  GLUCOSE 212* 140*  BUN 29* 22*  CREATININE 1.41* 1.19  CALCIUM 9.4 8.8*  AST 44*  --   ALT 18  --   ALKPHOS 56  --   BILITOT 0.5  --    ------------------------------------------------------------------------------------------------------------------  Cardiac Enzymes No results for input(s): TROPONINI in the last 168 hours. ------------------------------------------------------------------------------------------------------------------  RADIOLOGY:  No results found.   ASSESSMENT AND PLAN:   76 year old male with past medical history of osteoarthritis, hypertension, diabetes, hypothyroidism, history of internal hemorrhoids, anxiety/depression who presented to the hospital due to rectal bleeding.   1. Lower GI bleed- cause of patient's rectal bleeding. Patient has a history of internal hemorrhoids. -Status post upper GI endoscopy showing no acute bleeding, flexible sigmoidoscopy showing some stool burden and internal hemorrhoids but no evidence of acute bleeding. -Patient now on a soft diet and  tolerating well. As per GI the likely cause of patient's rectal bleeding was diverticular  oriented or stercoral ulcer from constipation.   - If has a bowel movement today with no acute bleeding and likely can be discharged home later today or by tomorrow morning. Hemoglobin presently stable.  2. Acute blood loss anemia-secondary to GI bleed. Patient has been transfused 2 units of blood cells. Hemoglobin stable. No acute need for further transfusions presently.   3. Diabetes type 2 without complication-continue NovoLog sliding scale.  4. GERD-continue Protonix.  5. Hypothyroidism-continue Synthroid.  6. Neuropathy-continue gabapentin.  7. Anxiety/depression-continue Xanax, Lexapro.  Discussed Plan of care with GI and also pt's daughter at bedside. Questions answered.   All the records are reviewed and case discussed with Care Management/Social Worker. Management plans discussed with the patient, family and they are in agreement.  CODE STATUS: Full Code  DVT Prophylaxis: Ted's & SCD's   TOTAL TIME TAKING CARE OF THIS PATIENT: 30 minutes.   POSSIBLE D/C IN 1-2 DAYS, DEPENDING ON CLINICAL CONDITION.   Henreitta Leber M.D on 12/18/2016 at 12:46 PM  Between 7am to 6pm - Pager - [phone]  After 6pm go to [email or web address] - Proofreader  [facility]  Office  [phone]  CC: Primary care physician; Viviana Simpler, MD

## 2016-12-18 NOTE — Progress Notes (Signed)
Pt had a bright red bloody bowel movement, medium amount. VSS. Pt is alert and oriented. Prime doctor and Dr. Michail Sermon notified of findings. No new orders placed at this time. Will continue to monitor pt.   Carol Loftin [employer]

## 2016-12-18 NOTE — Progress Notes (Signed)
Gastroenterology Progress Note    Randy Mcdaniel. 76 y.o. [birth year]   Subjective: No bleeding overnight. No BM. Denies abdominal pain. Daughter at bedside.  Objective: Vital signs in last 24 hours: Vitals:   12/18/16 0526 12/18/16 0528  BP: (!) 176/72 (!) 157/87  Pulse: 97 (!) 113  Resp:    Temp:    T 97.5  Physical Exam: Gen: elderly, frail, alert, no acute distress, well-nourished CV: RRR Chest: CTA B Abd: minimal RLQ tenderness with voluntary guarding, otherwise nontender, soft, nondistended, +BS Ext: no edema  Lab Results:  Recent Labs  12/16/16 1131 12/17/16 0609  NA 138 137  K 4.8 4.7  CL 106 108  CO2 23 23  GLUCOSE 212* 140*  BUN 29* 22*  CREATININE 1.41* 1.19  CALCIUM 9.4 8.8*    Recent Labs  12/16/16 1131  AST 44*  ALT 18  ALKPHOS 56  BILITOT 0.5  PROT 7.6  ALBUMIN 4.2    Recent Labs  12/16/16 1131  12/17/16 0609 12/17/16 0800 12/17/16 1404  WBC 8.7  --  6.2  --   --   HGB 7.7*  < > 8.8* 8.5* 8.7*  HCT 24.0*  --  26.8* 25.8*  --   MCV 79.3*  --  80.9  --   --   PLT 424  --  319  --   --   < > = values in this interval not displayed. No results for input(s): LABPROT, INR in the last 72 hours.    Assessment/Plan: Lower GI bleed that seems to have resolved - suspect stercoral ulcer from constipation vs. Hemorrhoids. Diverticular bleed also possible. Doubt malignancy with colonoscopy being done in 2017. Miralax BID. If next BM not bloody then ok to go home on daily Miralax (unless diarrhea develops). Hgb stable at 8.7. I do not think a repeat colonoscopy is needed. If rebleeding occurs as an outpt and he has been having normal stools then he may need to have repeat therapy to his known hemorrhoids. Dr. Vicente Males will f/u tomorrow if patient not discharged. Answered all of daughter's questions.   [place] C. 12/18/2016, 11:13 AMPatient ID: Randy Mcdaniel., male   DOB: [birth year], 76 y.o.   MRN: [id number]

## 2016-12-18 NOTE — Progress Notes (Signed)
Patient ID: Randy Mcdaniel., male   DOB: 28-Dec-1940, 76 y.o.   MRN: [id number]  Nurse called and hospitalist called regarding a bright red bloody BM he had this afternoon. Vitals stable. Question hemorrhoidal source and if bleeding continues then may need repeat banding of his hemorrhoids (agree with hospitalist calling surgery if rectal bleeding continues). Also, consider repeat flex sig tomorrow if bleeding persists but would not recommend a colon prep for colonoscopy at this time since he had one in 2017. If no hemorrhoidal seen and bleeding persists then consider repeat flex sig in the next 1-2 days vs colonoscopy. Hgb stable at 8.7. Dr. Vicente Males will f/u tomorrow.

## 2016-12-19 ENCOUNTER — Encounter: Payer: Self-pay | Admitting: Gastroenterology

## 2016-12-19 DIAGNOSIS — K922 Gastrointestinal hemorrhage, unspecified: Secondary | ICD-10-CM

## 2016-12-19 LAB — GLUCOSE, CAPILLARY
GLUCOSE-CAPILLARY: 203 mg/dL — AB (ref 65–99)
GLUCOSE-CAPILLARY: 157 mg/dL — AB (ref 65–99)
Glucose-Capillary: 115 mg/dL — ABNORMAL HIGH (ref 65–99)
Glucose-Capillary: 113 mg/dL — ABNORMAL HIGH (ref 65–99)
Glucose-Capillary: 158 mg/dL — ABNORMAL HIGH (ref 65–99)
Glucose-Capillary: 167 mg/dL — ABNORMAL HIGH (ref 65–99)

## 2016-12-19 LAB — CBC
HCT: 24.6 % — ABNORMAL LOW (ref 40.0–52.0)
Hemoglobin: 8 g/dL — ABNORMAL LOW (ref 13.0–18.0)
MCH: 26.2 pg (ref 26.0–34.0)
MCHC: 32.6 g/dL (ref 32.0–36.0)
MCV: 80.3 fL (ref 80.0–100.0)
Platelets: 325 10*3/uL (ref 150–440)
RBC: 3.06 MIL/uL — ABNORMAL LOW (ref 4.40–5.90)
RDW: 15.6 % — AB (ref 11.5–14.5)
WBC: 7.3 10*3/uL (ref 3.8–10.6)

## 2016-12-19 LAB — IRON AND TIBC
IRON: 10 ug/dL — AB (ref 45–182)
Saturation Ratios: 3 % — ABNORMAL LOW (ref 17.9–39.5)
TIBC: 345 ug/dL (ref 250–450)
UIBC: 335 ug/dL

## 2016-12-19 LAB — FERRITIN: FERRITIN: 12 ng/mL — AB (ref 24–336)

## 2016-12-19 LAB — BASIC METABOLIC PANEL
ANION GAP: 6 (ref 5–15)
BUN: 16 mg/dL (ref 6–20)
CALCIUM: 8.7 mg/dL — AB (ref 8.9–10.3)
CO2: 27 mmol/L (ref 22–32)
CREATININE: 1.28 mg/dL — AB (ref 0.61–1.24)
Chloride: 106 mmol/L (ref 101–111)
GFR calc non Af Amer: 52 mL/min — ABNORMAL LOW (ref >60)
Glucose, Bld: 160 mg/dL — ABNORMAL HIGH (ref 65–99)
Potassium: 4.2 mmol/L (ref 3.5–5.1)
SODIUM: 139 mmol/L (ref 135–145)

## 2016-12-19 LAB — HEMOGLOBIN: Hemoglobin: 7.8 g/dL — ABNORMAL LOW (ref 13.0–18.0)

## 2016-12-19 LAB — FOLATE: Folate: 12.2 ng/mL (ref >5.9)

## 2016-12-19 LAB — VITAMIN B12: Vitamin B-12: 2744 pg/mL — ABNORMAL HIGH (ref 180–914)

## 2016-12-19 MED ORDER — SODIUM CHLORIDE 0.9 % IV SOLN: INTRAVENOUS | Status: DC

## 2016-12-19 MED ORDER — PEG 3350-KCL-NA BICARB-NACL 420 G PO SOLR
4000.0000 mL | Freq: Once | ORAL | Status: AC
  Administered 2016-12-19: 4000 mL via ORAL
  Filled 2016-12-19: qty 4000

## 2016-12-19 NOTE — Progress Notes (Signed)
Randy Bellows MD [address], [place], [place] [address] Phone: [phone] Fax : [phone]  Randy Mcdaniel. is being followed for GI bleeding  Day 2 of follow up   Subjective: Had two bowel movements with blood yesterday    Objective: Vital signs in last 24 hours: Vitals:   12/18/16 1345 12/18/16 2116 12/19/16 0415 12/19/16 0500  BP: (!) 130/59 (!) 128/58 (!) 125/53   Pulse: (!) 104 66 64   Resp: 17 20 17    Temp: 98.1 F (36.7 C) 97.9 F (36.6 C) 98.4 F (36.9 C)   TempSrc: Oral Oral Oral   SpO2: 94% 97% 97%   Weight:    209 lb 14.4 oz (95.2 kg)  Height:       Weight change: -6 lb 1.6 oz (-2.767 kg)  Intake/Output Summary (Last 24 hours) at 12/19/16 0940 Last data filed at 12/19/16 0730  Gross per 24 hour  Intake             1157 ml  Output              525 ml  Net              632 ml     Exam: Heart:: Regular rate and rhythm, S1S2 present or without murmur or extra heart sounds Lungs: clear to auscultation and clear to auscultation and percussion Abdomen: soft, nontender, normal bowel sounds   Lab Results: CBC Latest Ref Rng & Units 12/19/2016 12/17/2016 12/17/2016  WBC 3.8 - 10.6 K/uL - - -  Hemoglobin 13.0 - 18.0 g/dL 7.8(L) 8.7(L) 8.5(L)  Hematocrit 40.0 - 52.0 % - - 25.8(L)  Platelets 150 - 440 K/uL - - -    Micro Results: No results found for this or any previous visit (from the past 240 hour(s)). Studies/Results: No results found. Medications: I have reviewed the patient's current medications. Scheduled Meds: . atorvastatin  20 mg Oral Daily  . escitalopram  20 mg Oral q morning - 10a  . gabapentin  600 mg Oral QHS  . insulin aspart  0-5 Units Subcutaneous QHS  . insulin aspart  0-9 Units Subcutaneous TID WC  . insulin aspart  3 Units Subcutaneous TID WC  . levothyroxine  75 mcg Oral QAC breakfast  . OLANZapine  10 mg Oral QHS  . pantoprazole (PROTONIX) IV  40 mg Intravenous Q24H  . polyethylene glycol  17 g Oral BID  .  sodium chloride flush  3 mL Intravenous Q12H  . tamsulosin  0.4 mg Oral Daily  . vitamin B-12  1,000 mcg Oral Daily   Continuous Infusions: PRN Meds:.acetaminophen, ALPRAZolam   Assessment: Active Problems:   Acute posthemorrhagic anemia   Lower GI bleed   Near syncope   Hypotension  Randy Mcdaniel Randy Mcdaniel. 76 y.o. male with a history of left sided diverticulosis per colonoscopy 09/2015 admitted with rectal bleeding . H/o band ligation of hemorrhoids in 2017  . On admission Hb 7.7 and transfused 2 units on admission. EGD on 12/17/16 showed some duodenal erosions but no bleeding. Flex sig on 12/17/16 showed large qty of brown stool but no blood, small internal non bleeding hemorrhoids. On admission CBC showed microcytosis which is new - his MCV was normal 5 months back at 88. It is possible he had a diverticular bleed or bleeding from hemorroids.    Plan: 1. Discussed with patient options since having persistent rectal bleeding. 1. If he has no further to go home  and follow up with Dr Carlean Purl who is his GI doc ,option 2- if he continues to bleed will need a colonoscopy which he  is is not keen on . Option 3 to drink some Golytely to check if his stools clears up and if it does can go home and follow up with Dr Carlean Purl.   He will think about his options and let me know.  2. Check b12,folate, iron studies for low MCV.    LOS: 3 days   Randy Mcdaniel 12/19/2016, 9:40 AM

## 2016-12-19 NOTE — Progress Notes (Signed)
Per Dr. Vicente Males change pt from carb modified to NPO and sips with meds as he is to begin drinking prep at 3pm.

## 2016-12-19 NOTE — Progress Notes (Signed)
Randy Mcdaniel at [facility] NAME: Randy Mcdaniel    MR#:  [id number]  DATE OF BIRTH:  [birth year]  SUBJECTIVE:   Patient here with rectal bleeding and a lower GI bleed. s/p Endoscopy showing no Acute abnormality, flexible sigmoidoscopy showing internal hemorrhoids but no stigmata of acute bleeding.   Had 2 episodes of rectal bleeding yesterday after Flex Sig and endoscopy.  Seen by GI today and Hg. Stable.  Plan for Colonoscopy tomorrow.   REVIEW OF SYSTEMS:    Review of Systems  Constitutional: Negative for chills and fever.  HENT: Negative for congestion and tinnitus.   Eyes: Negative for blurred vision and double vision.  Respiratory: Negative for cough, shortness of breath and wheezing.   Cardiovascular: Negative for chest pain, orthopnea and PND.  Gastrointestinal: Positive for blood in stool. Negative for abdominal pain, diarrhea, nausea and vomiting.  Genitourinary: Negative for dysuria and hematuria.  Neurological: Negative for dizziness, sensory change and focal weakness.  All other systems reviewed and are negative.   Nutrition: Heart healthy Tolerating Diet: Yes Tolerating PT: Ambulatory.    DRUG ALLERGIES:   Allergies  Allergen Reactions  . Vytorin [Ezetimibe-Simvastatin]     REACTION: nausea    VITALS:  Blood pressure (!) 146/58, pulse 65, temperature 97.6 F (36.4 C), temperature source Oral, resp. rate 16, height 5\' 11"  (1.803 m), weight 95.2 kg (209 lb 14.4 oz), SpO2 96 %.  PHYSICAL EXAMINATION:   Physical Exam  GENERAL:  76 y.o.-year-old pale appearing patient lying in the bed in no acute distress. pale appearing patient lying in the bed in no acute distress.  EYES: Pupils equal, round, reactive to light and accommodation. No scleral icterus. Extraocular muscles intact.  HEENT: Head atraumatic, normocephalic. Oropharynx and nasopharynx clear.  NECK:  Supple, no jugular venous distention. No thyroid enlargement, no tenderness.  LUNGS: Normal breath sounds bilaterally, no  wheezing, rales, rhonchi. No use of accessory muscles of respiration.  CARDIOVASCULAR: S1, S2 normal. No murmurs, rubs, or gallops.  ABDOMEN: Soft, nontender, nondistended. Bowel sounds present. No organomegaly or mass.  EXTREMITIES: No cyanosis, clubbing or edema b/l.    NEUROLOGIC: Cranial nerves II through XII are intact. No focal Motor or sensory deficits b/l.   PSYCHIATRIC: The patient is alert and oriented x 3.  SKIN: No obvious rash, lesion, or ulcer.    LABORATORY PANEL:   CBC  Recent Labs Lab 12/17/16 0609 12/17/16 0800  12/19/16 0326  WBC 6.2  --   --   --   HGB 8.8* 8.5*  < > 7.8*  HCT 26.8* 25.8*  --   --   PLT 319  --   --   --   < > = values in this interval not displayed. ------------------------------------------------------------------------------------------------------------------  Chemistries   Recent Labs Lab 12/16/16 1131 12/17/16 0609  NA 138 137  K 4.8 4.7  CL 106 108  CO2 23 23  GLUCOSE 212* 140*  BUN 29* 22*  CREATININE 1.41* 1.19  CALCIUM 9.4 8.8*  AST 44*  --   ALT 18  --   ALKPHOS 56  --   BILITOT 0.5  --    ------------------------------------------------------------------------------------------------------------------  Cardiac Enzymes No results for input(s): TROPONINI in the last 168 hours. ------------------------------------------------------------------------------------------------------------------  RADIOLOGY:  No results found.   ASSESSMENT AND PLAN:   76 year old male with past medical history of osteoarthritis, hypertension, diabetes, hypothyroidism, history of internal hemorrhoids, anxiety/depression who presented to the hospital due to rectal bleeding.   1. Lower GI bleed- cause of patient's rectal bleeding. Patient  has a history of internal hemorrhoids. -Status post upper GI endoscopy showing no acute bleeding, flexible sigmoidoscopy showing some stool burden and internal hemorrhoids but no evidence of acute  bleeding. - Patient had 2 bloody bowel movements yesterday after his endoscopy and flexible sigmoidoscopy. Seen by gastroenterology today and plan for colonoscopy tomorrow. Patient to get prep tonight. -Hemoglobin today 7.5 and we'll transfuse if less than 7. -Tolerating a soft diet well.  2. Acute blood loss anemia-secondary to GI bleed. Patient has been transfused 2 units of blood cells. Hemoglobin stable. No acute need for further transfusions presently.  - transfuse if Hg. < 7.    3. Diabetes type 2 without complication-continue NovoLog sliding scale.  4. GERD-continue Protonix.  5. Hypothyroidism-continue Synthroid.  6. Neuropathy-continue gabapentin.  7. Anxiety/depression-continue Xanax, Lexapro.  Discussed Plan of care with GI and also pt's wife at bedside.   All the records are reviewed and case discussed with Care Management/Social Worker. Management plans discussed with the patient, family and they are in agreement.  CODE STATUS: Full Code  DVT Prophylaxis: Ted's & SCD's   TOTAL TIME TAKING CARE OF THIS PATIENT: 25 minutes.   POSSIBLE D/C IN 1-2 DAYS, DEPENDING ON CLINICAL CONDITION.   Henreitta Leber M.D on 12/19/2016 at 3:53 PM  Between 7am to 6pm - Pager - [phone]  After 6pm go to [email or web address] - Proofreader  [facility]  Office  [phone]  CC: Primary care physician; Viviana Simpler, MD

## 2016-12-19 NOTE — Progress Notes (Signed)
Called Dr. Jannifer Franklin regarding near syncope episode.  Vitals were normal and blood sugar was good.  Doctor placed orders for telemetry, EKG, and labs.  Christene Slates  12/19/2016  8:40 PM

## 2016-12-20 ENCOUNTER — Inpatient Hospital Stay: Payer: Medicare Other | Admitting: Registered Nurse

## 2016-12-20 ENCOUNTER — Encounter
Admission: EM | Disposition: A | Discharge status: Home Health | Payer: Self-pay | Source: Home / Self Care | Attending: Specialist

## 2016-12-20 DIAGNOSIS — K649 Unspecified hemorrhoids: Secondary | ICD-10-CM

## 2016-12-20 HISTORY — PX: COLONOSCOPY WITH PROPOFOL: SHX5780

## 2016-12-20 LAB — GLUCOSE, CAPILLARY
Glucose-Capillary: 141 mg/dL — ABNORMAL HIGH (ref 65–99)
Glucose-Capillary: 184 mg/dL — ABNORMAL HIGH (ref 65–99)

## 2016-12-20 LAB — CBC
HCT: 24.7 % — ABNORMAL LOW (ref 40.0–52.0)
Hemoglobin: 8.2 g/dL — ABNORMAL LOW (ref 13.0–18.0)
MCH: 27.2 pg (ref 26.0–34.0)
MCHC: 33.3 g/dL (ref 32.0–36.0)
MCV: 81.7 fL (ref 80.0–100.0)
PLATELETS: 301 10*3/uL (ref 150–440)
RBC: 3.03 MIL/uL — AB (ref 4.40–5.90)
RDW: 15.4 % — ABNORMAL HIGH (ref 11.5–14.5)
WBC: 7.2 10*3/uL (ref 3.8–10.6)

## 2016-12-20 SURGERY — COLONOSCOPY WITH PROPOFOL
Anesthesia: General

## 2016-12-20 MED ORDER — LIDOCAINE HCL (PF) 2 % IJ SOLN
INTRAMUSCULAR | Status: AC
  Filled 2016-12-20: qty 2

## 2016-12-20 MED ORDER — SODIUM CHLORIDE 0.9 % IV SOLN
INTRAVENOUS | Status: DC
  Administered 2016-12-20: 08:00:00 via INTRAVENOUS

## 2016-12-20 MED ORDER — PROPOFOL 10 MG/ML IV BOLUS
INTRAVENOUS | Status: DC | PRN
  Administered 2016-12-20: 50 mg via INTRAVENOUS

## 2016-12-20 MED ORDER — PROPOFOL 500 MG/50ML IV EMUL
INTRAVENOUS | Status: DC | PRN
  Administered 2016-12-20: 120 ug/kg/min via INTRAVENOUS

## 2016-12-20 MED ORDER — PROPOFOL 500 MG/50ML IV EMUL
INTRAVENOUS | Status: AC
  Filled 2016-12-20: qty 50

## 2016-12-20 MED ORDER — PHENYLEPHRINE HCL 10 MG/ML IJ SOLN
INTRAMUSCULAR | Status: AC
  Filled 2016-12-20: qty 1

## 2016-12-20 MED ORDER — EPHEDRINE SULFATE 50 MG/ML IJ SOLN
INTRAMUSCULAR | Status: AC
  Filled 2016-12-20: qty 1

## 2016-12-20 NOTE — Care Management Important Message (Signed)
Important Message  Patient Details  Name: Randy Mcdaniel. MRN: [id number] Date of Birth: 05-17-1941   Medicare Important Message Given:  Yes    Beverly Sessions, RN 12/20/2016, 1:41 PM

## 2016-12-20 NOTE — Discharge Instructions (Signed)
Gastrointestinal Bleeding °Gastrointestinal bleeding is bleeding somewhere along the path food travels through the body (digestive tract). This path is anywhere between the mouth and the opening of the butt (anus). You may have blood in your poop (stools) or have black poop. If you throw up (vomit), there may be blood in it. °This condition can be mild, serious, or even life-threatening. If you have a lot of bleeding, you may need to stay in the hospital. °Follow these instructions at home: °· Take over-the-counter and prescription medicines only as told by your doctor. °· Eat foods that have a lot of fiber in them. These foods include whole grains, fruits, and vegetables. You can also try eating 1-3 prunes each day. °· Drink enough fluid to keep your pee (urine) clear or pale yellow. °· Keep all follow-up visits as told by your doctor. This is important. °Contact a doctor if: °· Your symptoms do not get better. °Get help right away if: °· Your bleeding gets worse. °· You feel dizzy or you pass out (faint). °· You feel weak. °· You have very bad cramps in your back or belly (abdomen). °· You pass large clumps of blood (clots) in your poop. °· Your symptoms are getting worse. °This information is not intended to replace advice given to you by your health care provider. Make sure you discuss any questions you have with your health care provider. °Document Released: 05/24/2008 Document Revised: 01/21/2016 Document Reviewed: 02/02/2015 °Elsevier Interactive Patient Education © 2017 Elsevier Inc. ° °

## 2016-12-20 NOTE — Anesthesia Post-op Follow-up Note (Cosign Needed)
Anesthesia QCDR form completed.        

## 2016-12-20 NOTE — Progress Notes (Signed)
Pt A and O x 4. VSS. Pt tolerating diet well. No complaints of pain or nausea. IV removed intact, no new prescriptions given. Pt voiced understanding of discharge instructions with no further questions. Pt discharged via wheelchair with nurse.

## 2016-12-20 NOTE — H&P (Signed)
Jonathon Bellows MD [address], [place], [place] [address] Phone: [phone] Fax : [phone]  Primary Care Physician:  Viviana Simpler, MD Primary Gastroenterologist:  Dr. Jonathon Bellows   Pre-Procedure History & Physical: HPI:  Randy Mcdaniel. is a 76 y.o. male is here for an colonoscopy.   Past Medical History:  Diagnosis Date  . Anxiety   . Depression    recurrent and severe  . Diabetes mellitus    Type II with nephropathy  . GI bleed   . HLD (hyperlipidemia)   . Hypothyroidism   . Internal hemorrhoid   . Osteoarthritis   . Personal history of colonic adenoma 12/03/2012    Past Surgical History:  Procedure Laterality Date  . COLONOSCOPY    . ESOPHAGOGASTRODUODENOSCOPY (EGD) WITH PROPOFOL N/A 12/17/2016   Procedure: ESOPHAGOGASTRODUODENOSCOPY (EGD) WITH PROPOFOL;  Surgeon: Wilford Corner, MD;  [facility] END[facility]COPY;  Service: Endoscopy;  Laterality: N/A;  . FLEXIBLE SIGMOIDOSCOPY N/A 12/17/2016   Procedure: FLEXIBLE SIGMOIDOSCOPY;  Surgeon: Wilford Corner, MD;  Location: [facility] ENDOSCOPY;  Service: Endoscopy;  Laterality: N/A;  . HEMORRHOID BANDING    . TOTAL KNEE ARTHROPLASTY  11/09   Right-Dr. Clair Gulling ([facility])  . TOTAL KNEE ARTHROPLASTY  6/10   Left-Dr. Clair Gulling ([facility])  . UMBILICAL HERNIA REPAIR  2000    Prior to Admission medications   Medication Sig Start Date End Date Taking? Authorizing Provider  acetaminophen (TYLENOL) 650 MG CR tablet Take 650 mg by mouth 3 (three) times daily.    Yes Historical Provider, MD  ALPRAZolam Duanne Moron) 0.5 MG tablet TAKE 1 TABLET BY MOUTH 3 TIMES DAILY AS NEEDED 10/04/16  Yes Venia Carbon, MD  atorvastatin (LIPITOR) 20 MG tablet TAKE 1 TABLET BY MOUTH ONCE DAILY 11/15/16  Yes Venia Carbon, MD  escitalopram (LEXAPRO) 20 MG tablet TAKE 2 TABLETS BY MOUTH EVERY MORNING Patient taking differently: TAKE 1 TABLET BY MOUTH EVERY MORNING 11/15/16  Yes Venia Carbon, MD  gabapentin (NEURONTIN) 300 MG capsule Take 1-2 capsules  (300-600 mg total) by mouth at bedtime. Patient taking differently: Take 600 mg by mouth at bedtime.  11/20/15  Yes Venia Carbon, MD  glipiZIDE (GLUCOTROL) 5 MG tablet TAKE 1 TABLET BY MOUTH TWICE A DAY BEFORE A MEAL 11/15/16  Yes Venia Carbon, MD  levothyroxine (SYNTHROID, LEVOTHROID) 75 MCG tablet TAKE 1 TABLET BY MOUTH ONCE A DAY 08/30/16  Yes Venia Carbon, MD  lisinopril (PRINIVIL,ZESTRIL) 10 MG tablet TAKE 1 TABLET BY MOUTH ONCE DAILY 10/11/16  Yes Venia Carbon, MD  metFORMIN (GLUCOPHAGE) 500 MG tablet TAKE 1 TABLET BY MOUTH 3 TIMES DAILY 02/15/16  Yes Venia Carbon, MD  methylphenidate (RITALIN) 5 MG tablet Take 1 tablet (5 mg total) by mouth 2 (two) times daily. Fill in about 1 month Patient taking differently: Take 5 mg by mouth daily. Fill in about 1 month 07/06/16  Yes Venia Carbon, MD  naproxen sodium (ANAPROX) 220 MG tablet Take 220-440 mg by mouth at bedtime as needed.   Yes Historical Provider, MD  OLANZapine (ZYPREXA) 10 MG tablet TAKE 1 TABLET BY MOUTH DAILY AT BEDTIME 10/11/16  Yes Venia Carbon, MD  vitamin B-12 (CYANOCOBALAMIN) 1000 MCG tablet Take 1,000 mcg by mouth daily.     Yes Historical Provider, MD  glucose blood test strip Dx: 250.40 03/25/11   Venia Carbon, MD    Allergies as of 12/16/2016 - Review Complete 12/16/2016  Allergen Reaction Noted  . Vytorin [  ezetimibe-simvastatin]  02/15/2007    Family History  Problem Relation Age of Onset  . Heart failure Father   . Diabetes Father   . Depression Father   . Coronary artery disease Father   . Breast cancer Sister   . Lung cancer Sister   . Depression Sister   . Stomach cancer Mother   . Stroke      Paternal side  . Parkinsonism      Cousin  . Colon cancer Neg Hx   . Rectal cancer Neg Hx   . Esophageal cancer Neg Hx   . Colon polyps Neg Hx     Social History   Social History  . Marital status: Married    Spouse name: N/A  . Number of children: 2  . Years of education: N/A    Occupational History  . Retired    Social History Main Topics  . Smoking status: Never Smoker  . Smokeless tobacco: Former Systems developer    Types: Chew  . Alcohol use No  . Drug use: No  . Sexual activity: Not on file   Other Topics Concern  . Not on file   Social History Narrative   Has living will.   No formal health care POA---requests wife to be   Would accept resuscitation attempts   No tube feeds if cognitively unaware    Review of Systems: See HPI, otherwise negative ROS  Physical Exam: BP (!) 159/63   Pulse 86   Temp 98.6 F (37 C) (Tympanic)   Resp 18   Ht 5\' 11"  (1.803 m)   Wt 215 lb (97.5 kg)   SpO2 93%   BMI 29.99 kg/m  General:   Alert,  pleasant and cooperative in NAD Head:  Normocephalic and atraumatic. Neck:  Supple; no masses or thyromegaly. Lungs:  Clear throughout to auscultation.    Heart:  Regular rate and rhythm. Abdomen:  Soft, nontender and nondistended. Normal bowel sounds, without guarding, and without rebound.   Neurologic:  Alert and  oriented x4;  grossly normal neurologically.  Impression/Plan: Randy Mcdaniel. is here for an colonoscopy to be performed for rectal bleeding   Risks, benefits, limitations, and alternatives regarding  colonoscopy have been reviewed with the patient.  Questions have been answered.  All parties agreeable.   Jonathon Bellows, MD  12/20/2016, 7:42 AM

## 2016-12-20 NOTE — Anesthesia Procedure Notes (Signed)
Date/Time: 12/20/2016 7:45 AM Performed by: Hedda Slade Pre-anesthesia Checklist: Patient identified, Emergency Drugs available, Suction available and Patient being monitored Patient Re-evaluated:Patient Re-evaluated prior to inductionOxygen Delivery Method: Nasal cannula

## 2016-12-20 NOTE — Plan of Care (Signed)
Problem: Food- and Nutrition-Related Knowledge Deficit (NB-1.1) Goal: Nutrition education Formal process to instruct or train a patient/client in a skill or to impart knowledge to help patients/clients voluntarily manage or modify food choices and eating behavior to maintain or improve health. Outcome: Completed/Met Date Met: 12/20/16 Nutrition Education Note  Patient's wife asked RD to provide education on high-fiber diet  76 year old male with PMHx of DM type 2, HLD, anxiety, depression who was admitted with GI bleed, found to have hemorrhoids on colonoscopy today. Per chart GI recommending high-fiber diet.  RD provided "High-Fiber Nutrition Therapy" handout from the Academy of Nutrition and Dietetics. Reviewed patient's dietary recall. Provided examples on ways to increase fiber intake. Encouraged fresh fruits and vegetables as well as whole grain sources of carbohydrates to maximize fiber intake. Encouraged slow increase of fiber intake over 2-3 weeks to daily goal of 25-35 grams of fiber daily. Encouraged intake of at least 8 cups fluid daily. Teach back method used.  Expect good compliance.  Body mass index is 29.99 kg/m. Pt meets criteria for overweight based on current BMI.  Current diet order is Heart Healthy, patient is consuming approximately 100% of meals at this time. Labs and medications reviewed. No further nutrition interventions warranted at this time. RD contact information provided. If additional nutrition issues arise, please re-consult RD.  Willey Blade, MS, RD, LDN Pager: [phone] After Hours Pager: [phone]

## 2016-12-20 NOTE — Anesthesia Postprocedure Evaluation (Signed)
Anesthesia Post Note  Patient: Randy Mcdaniel.  Procedure(s) Performed: Procedure(s) (LRB): COLONOSCOPY WITH PROPOFOL (N/A)  Patient location during evaluation: Endoscopy Anesthesia Type: General Level of consciousness: awake and alert Pain management: pain level controlled Vital Signs Assessment: post-procedure vital signs reviewed and stable Respiratory status: spontaneous breathing, nonlabored ventilation, respiratory function stable and patient connected to nasal cannula oxygen Cardiovascular status: blood pressure returned to baseline and stable Postop Assessment: no signs of nausea or vomiting Anesthetic complications: no     Last Vitals:  Vitals:   12/20/16 0819 12/20/16 0829  BP: (!) 142/75 (!) 144/77  Pulse: 77 82  Resp: (!) 21 15  Temp:      Last Pain:  Vitals:   12/20/16 0759  TempSrc: Tympanic  PainSc:                  Randy Mcdaniel

## 2016-12-20 NOTE — Transfer of Care (Signed)
Immediate Anesthesia Transfer of Care Note  Patient: Randy Mcdaniel.  Procedure(s) Performed: Procedure(s): COLONOSCOPY WITH PROPOFOL (N/A)  Patient Location: PACU  Anesthesia Type:General  Level of Consciousness: awake, alert  and oriented  Airway & Oxygen Therapy: Patient Spontanous Breathing and Patient connected to nasal cannula oxygen  Post-op Assessment: Report given to RN and Post -op Vital signs reviewed and stable  Post vital signs: Reviewed and stable  Last Vitals:  Vitals:   12/20/16 0757 12/20/16 0759  BP: 92/63 92/63  Pulse: 86 84  Resp: 11 12  Temp: 37.1 C 37.1 C    Last Pain:  Vitals:   12/20/16 0759  TempSrc: Tympanic  PainSc:       Patients Stated Pain Goal: 0 (09/28/41 8887)  Complications: No apparent anesthesia complications

## 2016-12-20 NOTE — Discharge Summary (Signed)
[facility] NAME: Randy Mcdaniel    MR#:  [id number]  DATE OF BIRTH:  14-Jun-1941  DATE OF ADMISSION:  12/16/2016 ADMITTING PHYSICIAN: Theodoro Grist, MD  DATE OF DISCHARGE: 12/20/2016  2:31 PM  PRIMARY CARE PHYSICIAN: Viviana Simpler, MD    ADMISSION DIAGNOSIS:  Acute GI bleeding [K92.2]  DISCHARGE DIAGNOSIS:  Active Problems:   Acute posthemorrhagic anemia   Lower GI bleed   Near syncope   Hypotension   SECONDARY DIAGNOSIS:   Past Medical History:  Diagnosis Date  . Anxiety   . Depression    recurrent and severe  . Diabetes mellitus    Type II with nephropathy  . GI bleed   . HLD (hyperlipidemia)   . Hypothyroidism   . Internal hemorrhoid   . Osteoarthritis   . Personal history of colonic adenoma 12/03/2012    HOSPITAL COURSE:   76 year old male with past medical history of osteoarthritis, hypertension, diabetes, hypothyroidism, history of internal hemorrhoids, anxiety/depression who presented to the hospital due to rectal bleeding.  1. Lower GI bleed- this was cause of patient's rectal bleeding.  -Patient was transfused a total of 2 units of packed red blood cells, and his hemoglobin has remained stable since then. He was seen by gastroenterology and underwent an upper GI endoscopy which showed no acute pathology, flexible sigmoidoscopy more endoscopy which showed internal hemorrhoids but no acute bleeding. -Patient also underwent a colonoscopy which showed no evidence of acute bleeding but a thrombosed external hemorrhoid which is suspected to be the cause of patient's rectal bleeding. Patient was advised to use sitz baths and high fiber diet and follow-up with a general surgeon for possible hemorrhoidal banding as an outpatient. -He had no further acute bleeding in the past 48 hours and is tolerating a soft diet well prior to discharge.  2. Acute blood loss anemia-secondary to GI bleed. Patient has been transfused 2  units of packed red blood cells and Hg. Remains stable.   Hg. Is 8.2 upon discharge.   3. Diabetes type 2 without complication- pt. Will resume Metformin upon discharge.  While in the hospital pt. Was on SSI.   4. Hypothyroidism- pt. Will  continue Synthroid.  5. Neuropathy- pt. Will continue gabapentin.  6. Anxiety/depression- pt. Will continue Xanax, Lexapro.  DISCHARGE CONDITIONS:   Stable.   CONSULTS OBTAINED:    DRUG ALLERGIES:   Allergies  Allergen Reactions  . Vytorin [Ezetimibe-Simvastatin]     REACTION: nausea    DISCHARGE MEDICATIONS:   Allergies as of 12/20/2016      Reactions   Vytorin [ezetimibe-simvastatin]    REACTION: nausea      Medication List    TAKE these medications   acetaminophen 650 MG CR tablet Commonly known as:  TYLENOL Take 650 mg by mouth 3 (three) times daily.   ALPRAZolam 0.5 MG tablet Commonly known as:  XANAX TAKE 1 TABLET BY MOUTH 3 TIMES DAILY AS NEEDED   atorvastatin 20 MG tablet Commonly known as:  LIPITOR TAKE 1 TABLET BY MOUTH ONCE DAILY   escitalopram 20 MG tablet Commonly known as:  LEXAPRO TAKE 2 TABLETS BY MOUTH EVERY MORNING What changed:  See the new instructions.   gabapentin 300 MG capsule Commonly known as:  NEURONTIN Take 1-2 capsules (300-600 mg total) by mouth at bedtime. What changed:  how much to take   glipiZIDE 5 MG tablet Commonly known as:  GLUCOTROL TAKE 1 TABLET BY MOUTH TWICE A DAY  BEFORE A MEAL   glucose blood test strip Dx: 250.40   levothyroxine 75 MCG tablet Commonly known as:  SYNTHROID, LEVOTHROID TAKE 1 TABLET BY MOUTH ONCE A DAY   lisinopril 10 MG tablet Commonly known as:  PRINIVIL,ZESTRIL TAKE 1 TABLET BY MOUTH ONCE DAILY   metFORMIN 500 MG tablet Commonly known as:  GLUCOPHAGE TAKE 1 TABLET BY MOUTH 3 TIMES DAILY   methylphenidate 5 MG tablet Commonly known as:  RITALIN Take 1 tablet (5 mg total) by mouth 2 (two) times daily. Fill in about 1 month What  changed:  when to take this  additional instructions   naproxen sodium 220 MG tablet Commonly known as:  ANAPROX Take 220-440 mg by mouth at bedtime as needed.   OLANZapine 10 MG tablet Commonly known as:  ZYPREXA TAKE 1 TABLET BY MOUTH DAILY AT BEDTIME   vitamin B-12 1000 MCG tablet Commonly known as:  CYANOCOBALAMIN Take 1,000 mcg by mouth daily.         DISCHARGE INSTRUCTIONS:   DIET:  Cardiac diet and Diabetic diet High fiber diet  DISCHARGE CONDITION:  Stable  ACTIVITY:  Activity as tolerated  OXYGEN:  Home Oxygen: No.   Oxygen Delivery: room air  DISCHARGE LOCATION:  Home with Home  Health PT   If you experience worsening of your admission symptoms, develop shortness of breath, life threatening emergency, suicidal or homicidal thoughts you must seek medical attention immediately by calling 911 or calling your MD immediately  if symptoms less severe.  You Must read complete instructions/literature along with all the possible adverse reactions/side effects for all the Medicines you take and that have been prescribed to you. Take any new Medicines after you have completely understood and accpet all the possible adverse reactions/side effects.   Please note  You were cared for by a hospitalist during your hospital stay. If you have any questions about your discharge medications or the care you received while you were in the hospital after you are discharged, you can call the unit and asked to speak with the hospitalist on call if the hospitalist that took care of you is not available. Once you are discharged, your primary care physician will handle any further medical issues. Please note that NO REFILLS for any discharge medications will be authorized once you are discharged, as it is imperative that you return to your primary care physician (or establish a relationship with a primary care physician if you do not have one) for your aftercare needs so that they can  reassess your need for medications and monitor your lab values.     Today   No acute bleeding overnight. Hg. Stable. Colonoscopy today showing no acute bleeding but just thrombosed ext. Hemorrhoid.   VITAL SIGNS:  Blood pressure (!) 155/81, pulse (!) 102, temperature 98.9 F (37.2 C), temperature source Oral, resp. rate 20, height 5\' 11"  (1.803 m), weight 97.5 kg (215 lb), SpO2 97 %.  I/O:   Intake/Output Summary (Last 24 hours) at 12/20/16 1704 Last data filed at 12/20/16 1419  Gross per 24 hour  Intake              563 ml  Output                0 ml  Net              563 ml    PHYSICAL EXAMINATION:   GENERAL:  76 y.o.-year-old pale obese patient lying in bed in no  acute distress.  EYES: Pupils equal, round, reactive to light and accommodation. No scleral icterus. Extraocular muscles intact.  HEENT: Head atraumatic, normocephalic. Oropharynx and nasopharynx clear.  NECK:  Supple, no jugular venous distention. No thyroid enlargement, no tenderness.  LUNGS: Normal breath sounds bilaterally, no wheezing, rales, rhonchi. No use of accessory muscles of respiration.  CARDIOVASCULAR: S1, S2 normal. No murmurs, rubs, or gallops.  ABDOMEN: Soft, nontender, nondistended. Bowel sounds present. No organomegaly or mass.  EXTREMITIES: No cyanosis, clubbing or edema b/l.    NEUROLOGIC: Cranial nerves II through XII are intact. No focal Motor or sensory deficits b/l. Globally weak.   PSYCHIATRIC: The patient is alert and oriented x 3.  SKIN: No obvious rash, lesion, or ulcer.   DATA REVIEW:   CBC  Recent Labs Lab 12/20/16 0556  WBC 7.2  HGB 8.2*  HCT 24.7*  PLT 301    Chemistries   Recent Labs Lab 12/16/16 1131  12/19/16 2056  NA 138  < > 139  K 4.8  < > 4.2  CL 106  < > 106  CO2 23  < > 27  GLUCOSE 212*  < > 160*  BUN 29*  < > 16  CREATININE 1.41*  < > 1.28*  CALCIUM 9.4  < > 8.7*  AST 44*  --   --   ALT 18  --   --   ALKPHOS 56  --   --   BILITOT 0.5  --   --    < > = values in this interval not displayed.  Cardiac Enzymes No results for input(s): TROPONINI in the last 168 hours.  Microbiology Results  Results for orders placed or performed in visit on 09/30/11  Fecal occult blood, imunochemical     Status: None   Collection Time: 09/30/11  2:39 PM  Result Value Ref Range Status   Fecal Occult Bld Negative Negative Final    RADIOLOGY:  No results found.    Management plans discussed with the patient, family and they are in agreement.  CODE STATUS:     Code Status Orders        Start     Ordered   12/16/16 1633  Full code  Continuous     12/16/16 1632    Code Status History    Date Active Date Inactive Code Status Order ID Comments User Context   This patient has a current code status but no historical code status.    Advance Directive Documentation     Most Recent Value  Type of Advance Directive  Healthcare Power of Attorney, Living will  Pre-existing out of facility DNR order (yellow form or pink MOST form)  -  "MOST" Form in Place?  -      TOTAL TIME TAKING CARE OF THIS PATIENT: 40 minutes.    Henreitta Leber M.D on 12/20/2016 at 5:04 PM  Between 7am to 6pm - Pager - [phone]  After 6pm go to [email or web address] - Proofreader  [facility]  Office  [phone]  CC: Primary care physician; Viviana Simpler, MD

## 2016-12-20 NOTE — Op Note (Signed)
[facility] Patient Name: Randy Mcdaniel Procedure Date: 12/20/2016 7:08 AM MRN: [id number] Account #: [id number] Date of Birth: 03/13/1941 Admit Type: Inpatient Age: 76 Room: [facility] ENDO ROOM 1 Gender: Male Note Status: Finalized Procedure:            Colonoscopy Indications:          Rectal bleeding Providers:            Jonathon Bellows MD, MD Referring MD:         Venia Carbon (Referring MD) Medicines:            Monitored Anesthesia Care Complications:        No immediate complications. Procedure:            Pre-Anesthesia Assessment:                       - Prior to the procedure, a History and Physical was                        performed, and patient medications, allergies and                        sensitivities were reviewed. The patient's tolerance of                        previous anesthesia was reviewed.                       - The risks and benefits of the procedure and the                        sedation options and risks were discussed with the                        patient. All questions were answered and informed                        consent was obtained.                       - ASA Grade Assessment: III - A patient with severe                        systemic disease.                       After obtaining informed consent, the colonoscope was                        passed under direct vision. Throughout the procedure,                        the patient's blood pressure, pulse, and oxygen                        saturations were monitored continuously. The                        Colonoscope was introduced through the anus with the                        intention  of advancing to the cecum. The scope was                        advanced to the splenic flexure before the procedure                        was aborted. Medications were given. The colonoscopy                        was performed with ease. The patient tolerated the                     procedure well. The quality of the bowel preparation                        was poor. There was a large qty of seeds, vegetable                        matter that could not be suctioned Findings:      The digital rectal exam findings include thrombosed external hemorrhoids.      A large amount of stool was found in the rectum, in the sigmoid colon       and in the descending colon, interfering with visualization. I attempted       to wash the area by irrigating , there was a very large qty of stool       which he ended up squirting out across the bed. Unable to irrigate       anyfurther as the qty was way too large. No blood seen anywhere. The       stool was yellow in color.      The exam was otherwise without abnormality.      {skip} Internal hemorrhoids were found but small in size and no stigmata       of bleeding.[Method Found]. [Size/Grade]. Impression:           - Preparation of the colon was poor.                       - Thrombosed external hemorrhoids found on digital                        rectal exam.                       - Stool in the rectum, in the sigmoid colon and in the                        descending colon.                       - The examination was otherwise normal.                       - Hemorrhoids.                       - No specimens collected. Recommendation:       - Return patient to hospital ward for possible                        discharge same day.                       -  Advance diet as tolerated.                       - Continue present medications.                       - 1. Likely bleeding from external hemorroids as I did                        see a thrombosed hemorroid which appeared acute. No                        active bleeding. This will require surgical                        intervention as an outpatient . Suggest high fiber diet                        , Sitz bath twice daily , use soft toilet paper and                         avoid excessive wiping , rather suggest washing with a                        shower after a bowel movement.                       2. No blood seen anywhere in the left side of the colon                        that was examined.                       3. Can be discharged today from GI point of view with                        follow up with his GI doctor Dr Carlean Purl who is well                        known to him                       4. I will sign out Procedure Code(s):    --- Professional ---                       [phone], 89, Colonoscopy, flexible; diagnostic, including                        collection of specimen(s) by brushing or washing, when                        performed (separate procedure) Diagnosis Code(s):    --- Professional ---                       K62.5, Hemorrhage of anus and rectum                       K64.5, Perianal venous thrombosis CPT copyright 2016 American Medical Association. All rights reserved. The codes documented in this report are  preliminary and upon coder review may  be revised to meet current compliance requirements. Jonathon Bellows, MD Jonathon Bellows MD, MD 12/20/2016 8:04:05 AM This report has been signed electronically. Number of Addenda: 0 Note Initiated On: 12/20/2016 7:08 AM Total Procedure Duration: 0 hours 4 minutes 12 seconds       [facility]

## 2016-12-20 NOTE — Care Management (Signed)
Patient admitted with lower GI bleed.  Lives at home with wife.  PCP [place]. Pharm[facility].  Patient has cane and walker in the home for ambulation.  Patient was ambulated by bedside nurse, and due to weakness it was felt that patient would benefit from home health PT.  Patient was provided home health agency preference. [facility] was selected.  Corene Cornea with [facility] notified of referral.  MD to enter order for home health PT.  RNCM signing off.

## 2016-12-20 NOTE — Anesthesia Preprocedure Evaluation (Signed)
Anesthesia Evaluation  Patient identified by MRN, date of birth, ID band Patient awake    Reviewed: Allergy & Precautions, NPO status , Patient's Chart, lab work & pertinent test results  History of Anesthesia Complications Negative for: history of anesthetic complications  Airway Mallampati: II       Dental  (+) Upper Dentures, Missing, Chipped   Pulmonary neg pulmonary ROS,           Cardiovascular negative cardio ROS       Neuro/Psych PSYCHIATRIC DISORDERS Anxiety Depression  Neuromuscular disease (peripheral neuropathy)    GI/Hepatic negative GI ROS, Neg liver ROS,   Endo/Other  diabetes, Type 2, Oral Hypoglycemic AgentsHypothyroidism   Renal/GU negative Renal ROS     Musculoskeletal  (+) Arthritis ,   Abdominal   Peds  Hematology  (+) anemia ,   Anesthesia Other Findings Past Medical History: No date: Anxiety No date: Depression     Comment: recurrent and severe No date: 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 No date: GI bleed No date: HLD (hyperlipidemia) No date: Hypothyroidism No date: Internal hemorrhoid No date: Osteoarthritis 12/03/2012: Personal history of colonic adenoma   Reproductive/Obstetrics                             Anesthesia Physical  Anesthesia Plan  ASA: III and emergent  Anesthesia Plan: General   Post-op Pain Management:    Induction: Intravenous  Airway Management Planned: Nasal Cannula  Additional Equipment:   Intra-op Plan:   Post-operative Plan:   Informed Consent: I have reviewed the patients History and Physical, chart, labs and discussed the procedure including the risks, benefits and alternatives for the proposed anesthesia with the patient or authorized representative who has indicated his/her understanding and acceptance.     Plan Discussed with:   Anesthesia Plan Comments:         Anesthesia Quick  Evaluation

## 2016-12-21 ENCOUNTER — Encounter: Payer: Self-pay | Admitting: Gastroenterology

## 2016-12-21 ENCOUNTER — Other Ambulatory Visit: Payer: Self-pay | Admitting: Internal Medicine

## 2016-12-22 DIAGNOSIS — E119 Type 2 diabetes mellitus without complications: Secondary | ICD-10-CM | POA: Diagnosis not present

## 2016-12-22 DIAGNOSIS — E1121 Type 2 diabetes mellitus with diabetic nephropathy: Secondary | ICD-10-CM | POA: Diagnosis not present

## 2016-12-22 DIAGNOSIS — E1142 Type 2 diabetes mellitus with diabetic polyneuropathy: Secondary | ICD-10-CM | POA: Diagnosis not present

## 2016-12-22 DIAGNOSIS — K922 Gastrointestinal hemorrhage, unspecified: Secondary | ICD-10-CM | POA: Diagnosis not present

## 2016-12-26 ENCOUNTER — Other Ambulatory Visit (INDEPENDENT_AMBULATORY_CARE_PROVIDER_SITE_OTHER): Payer: Medicare Other

## 2016-12-26 ENCOUNTER — Ambulatory Visit (INDEPENDENT_AMBULATORY_CARE_PROVIDER_SITE_OTHER): Payer: Medicare Other | Admitting: Nurse Practitioner

## 2016-12-26 ENCOUNTER — Encounter: Payer: Self-pay | Admitting: Nurse Practitioner

## 2016-12-26 VITALS — BP 130/72 | HR 100 | Ht 70.0 in | Wt 210.0 lb

## 2016-12-26 DIAGNOSIS — K5900 Constipation, unspecified: Secondary | ICD-10-CM

## 2016-12-26 DIAGNOSIS — D62 Acute posthemorrhagic anemia: Secondary | ICD-10-CM

## 2016-12-26 DIAGNOSIS — K648 Other hemorrhoids: Secondary | ICD-10-CM

## 2016-12-26 DIAGNOSIS — K625 Hemorrhage of anus and rectum: Secondary | ICD-10-CM | POA: Diagnosis not present

## 2016-12-26 LAB — CBC WITH DIFFERENTIAL/PLATELET
BASOS ABS: 0.1 10*3/uL (ref 0.0–0.1)
Basophils Relative: 0.8 % (ref 0.0–3.0)
Eosinophils Absolute: 0.1 10*3/uL (ref 0.0–0.7)
Eosinophils Relative: 1.1 % (ref 0.0–5.0)
HCT: 27.2 % — ABNORMAL LOW (ref 39.0–52.0)
Hemoglobin: 8.8 g/dL — ABNORMAL LOW (ref 13.0–17.0)
LYMPHS ABS: 1.4 10*3/uL (ref 0.7–4.0)
Lymphocytes Relative: 14.3 % (ref 12.0–46.0)
MCHC: 32.5 g/dL (ref 30.0–36.0)
MCV: 81.7 fl (ref 78.0–100.0)
Monocytes Absolute: 0.6 10*3/uL (ref 0.1–1.0)
Monocytes Relative: 6.4 % (ref 3.0–12.0)
NEUTROS ABS: 7.5 10*3/uL (ref 1.4–7.7)
NEUTROS PCT: 77.4 % — AB (ref 43.0–77.0)
PLATELETS: 363 10*3/uL (ref 150.0–400.0)
RBC: 3.33 Mil/uL — ABNORMAL LOW (ref 4.22–5.81)
RDW: 15.6 % — AB (ref 11.5–15.5)
WBC: 9.7 10*3/uL (ref 4.0–10.5)

## 2016-12-26 MED ORDER — HYDROCORTISONE 2.5 % RE CREA: 1.0000 "application " | TOPICAL_CREAM | Freq: Every day | RECTAL | 1 refills | Status: DC

## 2016-12-26 NOTE — Progress Notes (Addendum)
     HPI: Patient is a 76 year old male known to Dr. Carlean Purl. He has a history of diverticulosis. He is status post hemorrhoidal banding May 2017. Patient was recently hospitalized at [facility] bleeding for painless rectal bleeding. He had been bleeding for several days prior to admission. Hgb 13.9 in November , down to 7.7 on day of admission. He wasn't on blood thinners nor NSAIDS. He got 2 units of blood and hgb only 8.2 upon discharge. Inpatient EGD showed only a few gastric erosions. Colonoscopy attempted but but prep too poor, only flex sig done. Diverticular bleed suspected. Hemorrhoids found. Since discharge he has had scant rectal bleeding a couple of times.  Patient had a colonoscopy Feb 2017 revealing grade 3 internal hemorroids and diverticulosis.   He has been constipated since Christmas. This week he started Metamucil and a stool softner and stools much softer.   Past Medical History:  Diagnosis Date  . Anxiety   . Depression    recurrent and severe  . Diabetes mellitus    Type II with nephropathy  . GI bleed   . HLD (hyperlipidemia)   . Hypothyroidism   . Internal hemorrhoid   . Osteoarthritis   . Personal history of colonic adenoma 12/03/2012    Patient's surgical history, family medical history, social history, medications and allergies were all reviewed in Epic    Physical Exam: BP 130/72   Pulse 100   Ht 5\' 10"  (1.778 m)   Wt 210 lb (95.3 kg)   BMI 30.13 kg/m   GENERAL: Well developed elderly white male in NAD PSYCH: :Pleasant, cooperative, normal affect HEENT: conjunctiva pink, mucous membranes moist, neck supple without masses CARDIAC:  RRR, trace BLE edema PULM: Normal respiratory effort, lungs CTA bilaterally, no wheezing ABDOMEN:  soft, nontender, nondistended, no obvious masses,  normal bowel sounds SKIN:  turgor, no lesions seen Musculoskeletal:  Normal muscle tone, *normal strength NEURO: Alert and oriented x 3, no focal  neurologic deficits  ASSESSMENT and PLAN:  50. 76 year old male recently hospitalized at [facility] with painless rectal bleeding and severe anemia. Nothing on inpatient EGD to account for bleeding. Colonoscopy attempted but prep too poor with stool in the rectosigmoid colon. Internal hemorrhoids found . Bleeding felt to be a diverticular hemorrhage. Only a scant amount of bleeding since hospital discharge but this is likely hemorrhoidal.  -Patient is status post multiple banding May 2017. Given recurrent hemorrhoids will treat with steroid cream. If bleeding persist patient will call back at which time he will be scheduled to see Dr. Carlean Purl to discuss repeat banding.  In the interim he needs to continue stool softeners and fiber. No need to repeat colonoscopy at this time since he had a complete exam in February 2017  2. Anemia of acute blood loss. Hemoglobin fell from baseline of nearly 14 down to 7.7.Marland Kitchen After 2 units of blood hemoglobin rose to only 8.2. I'm hesitant to start him on iron given to constipation and hemorrhoids. Will recheck a CBC today   Tye Savoy , NP 12/26/2016, 10:08 AM   Agree with Ms. Neda Willenbring's assessment and plan. He is coming 6/6 re: repeat banding Gatha Mayer, MD, [facility]

## 2016-12-26 NOTE — Patient Instructions (Signed)
If you are age 77 or older, your body mass index should be between 23-30. Your Body mass index is 30.13 kg/m. If this is out of the aforementioned range listed, please consider follow up with your Primary Care Provider.  If you are age 42 or younger, your body mass index should be between 19-25. Your Body mass index is 30.13 kg/m. If this is out of the aformentioned range listed, please consider follow up with your Primary Care Provider.   Your physician has requested that you go to the basement for the following lab work before leaving today: CBC  We have sent the following medications to your pharmacy for you to pick up at your convenience: Anusol Cream  Purchase Glycerin Suppositories.  Call if bleeding persist.  Thank you for choosing me and [facility].  Tye Savoy, NP

## 2016-12-27 ENCOUNTER — Encounter: Payer: Self-pay | Admitting: Internal Medicine

## 2016-12-27 ENCOUNTER — Ambulatory Visit (INDEPENDENT_AMBULATORY_CARE_PROVIDER_SITE_OTHER): Payer: Medicare Other | Admitting: Internal Medicine

## 2016-12-27 VITALS — BP 142/76 | HR 100 | Temp 98.3°F | Wt 210.5 lb

## 2016-12-27 DIAGNOSIS — K922 Gastrointestinal hemorrhage, unspecified: Secondary | ICD-10-CM | POA: Diagnosis not present

## 2016-12-27 NOTE — Patient Instructions (Signed)
Please continue the metamucil. Change the stool softener to senna-S, 2 tabs daily. If you are not going regularly on this, please add miralax-- 1 capful with full glass of water daily.

## 2016-12-27 NOTE — Progress Notes (Signed)
Subjective:    Patient ID: Randy Mcdaniel., male    DOB: April 24, 1941, 76 y.o.   MRN: [id number]  HPI Here with wife for follow up of GI bleed  He had gush of bright red blood --- in and around toilet bowl Wife took him to ER after that Reviewed hospital records--- quite a tremendous drop in hemoglobin Transfused x 2 EGD negative Colonoscopy negative (only partial)---only found hemorrhoids Had GI follow up yesterday Hemoglobin now up to 8.8  Appetite is good Bowels "still not functioning" Taking metamucil and docusate Did try miralax last year--- didn't think it helped much  Current Outpatient Prescriptions on File Prior to Visit  Medication Sig Dispense Refill  . atorvastatin (LIPITOR) 20 MG tablet TAKE 1 TABLET BY MOUTH ONCE DAILY 90 tablet 2  . escitalopram (LEXAPRO) 20 MG tablet TAKE 2 TABLETS BY MOUTH EVERY MORNING (Patient taking differently: TAKE 1 TABLET BY MOUTH EVERY MORNING) 180 tablet 2  . gabapentin (NEURONTIN) 300 MG capsule Take 1-2 capsules (300-600 mg total) by mouth at bedtime. (Patient taking differently: Take 600 mg by mouth at bedtime. ) 60 capsule 11  . glipiZIDE (GLUCOTROL) 5 MG tablet TAKE 1 TABLET BY MOUTH TWICE A DAY BEFORE A MEAL 180 tablet 2  . glucose blood test strip Dx: 250.40 100 each 6  . hydrocortisone (ANUSOL-HC) 2.5 % rectal cream Place 1 application rectally at bedtime. For 7 days 30 g 1  . levothyroxine (SYNTHROID, LEVOTHROID) 75 MCG tablet TAKE 1 TABLET BY MOUTH ONCE A DAY 90 tablet 3  . lisinopril (PRINIVIL,ZESTRIL) 10 MG tablet TAKE 1 TABLET BY MOUTH ONCE DAILY 90 tablet 1  . metFORMIN (GLUCOPHAGE) 500 MG tablet TAKE 1 TABLET BY MOUTH 3 TIMES DAILY 270 tablet 3  . methylphenidate (RITALIN) 5 MG tablet Take 1 tablet (5 mg total) by mouth 2 (two) times daily. Fill in about 1 month (Patient taking differently: Take 5 mg by mouth daily. Fill in about 1 month) 60 tablet 0  . OLANZapine (ZYPREXA) 10 MG tablet TAKE 1 TABLET BY MOUTH AT BEDTIME  30 tablet 5  . vitamin B-12 (CYANOCOBALAMIN) 1000 MCG tablet Take 1,000 mcg by mouth daily.       No current facility-administered medications on file prior to visit.     Allergies  Allergen Reactions  . Vytorin [Ezetimibe-Simvastatin]     REACTION: nausea    Past Medical History:  Diagnosis Date  . Anxiety   . Depression    recurrent and severe  . Diabetes mellitus    Type II with nephropathy  . GI bleed   . HLD (hyperlipidemia)   . Hypothyroidism   . Internal hemorrhoid   . Osteoarthritis   . Personal history of colonic adenoma 12/03/2012    Past Surgical History:  Procedure Laterality Date  . COLONOSCOPY    . COLONOSCOPY WITH PROPOFOL N/A 12/20/2016   Procedure: COLONOSCOPY WITH PROPOFOL;  Surgeon: Jonathon Bellows, MD;  Location: [facility] ENDOSCOPY;  Service: Endoscopy;  Laterality: N/A;  . ESOPHAGOGASTRODUODENOSCOPY (EGD) WITH PROPOFOL N/A 12/17/2016   Procedure: ESOPHAGOGASTRODUODENOSCOPY (EGD) WITH PROPOFOL;  Surgeon: Wilford Corner, MD;  Location: [facility] ENDOSCOPY;  Service: Endoscopy;  Laterality: N/A;  . FLEXIBLE SIGMOIDOSCOPY N/A 12/17/2016   Procedure: FLEXIBLE SIGMOIDOSCOPY;  Surgeon: Wilford Corner, MD;  Location: [facility] ENDOSCOPY;  Service: Endoscopy;  Laterality: N/A;  . HEMORRHOID BANDING    . TOTAL KNEE ARTHROPLASTY  11/09   Right-Dr. Clair Gulling ([facility])  . TOTAL KNEE ARTHROPLASTY  6/10   Left-Dr.  Easley ([facility])  . UMBILICAL HERNIA REPAIR  2000    Family History  Problem Relation Age of Onset  . Heart failure Father   . Diabetes Father   . Depression Father   . Coronary artery disease Father   . Breast cancer Sister   . Lung cancer Sister   . Depression Sister   . Stomach cancer Mother   . Stroke      Paternal side  . Parkinsonism      Cousin  . Colon cancer Neg Hx   . Rectal cancer Neg Hx   . Esophageal cancer Neg Hx   . Colon polyps Neg Hx     Social History   Social History  . Marital status: Married    Spouse name: [name]  . Number of children: 2    . Years of education: N/A   Occupational History  . Retired    Social History Main Topics  . Smoking status: Never Smoker  . Smokeless tobacco: Former Systems developer    Types: Chew  . Alcohol use No  . Drug use: No  . Sexual activity: Not on file   Other Topics Concern  . Not on file   Social History Narrative   Has living will.   No formal health care POA---requests wife to be   Would accept resuscitation attempts   No tube feeds if cognitively unaware   Review of Systems  No chest pain No SOB--but has stable DOE     Objective:   Physical Exam  Constitutional: He appears well-nourished. No distress.  Cardiovascular: Normal rate, regular rhythm and normal heart sounds.  Exam reveals no gallop.   No murmur heard. Pulmonary/Chest: Effort normal and breath sounds normal. No respiratory distress. He has no wheezes. He has no rales.  Abdominal: Soft. Bowel sounds are normal. He exhibits no distension. There is no tenderness. There is no rebound and no guarding.          Assessment & Plan:

## 2016-12-27 NOTE — Assessment & Plan Note (Signed)
Only etiology is hemorrhoid Discussed bowel regimen Hemoglobin up some May need to consider repeat banding

## 2016-12-29 ENCOUNTER — Other Ambulatory Visit: Payer: Self-pay

## 2016-12-29 ENCOUNTER — Telehealth: Payer: Self-pay

## 2016-12-29 DIAGNOSIS — D62 Acute posthemorrhagic anemia: Secondary | ICD-10-CM

## 2016-12-29 NOTE — Telephone Encounter (Signed)
I spoke with Mr Beltran. He saw his PCP5/1/18. PCP adjusted his bowel regimen because the patient is still having issues with constipation. Please see the office note. Presently patient is taking Metamucil and Senna. He is still feeling constipated. He does not have daily bowel movements. He is willing to add Miralax 1 capful with a full glass of water daily. He declines to start an iron supplement until he has the constipation under control. Repeat labs in one month.

## 2016-12-29 NOTE — Telephone Encounter (Signed)
-----   Message from Willia Craze, NP sent at 12/28/2016  1:38 PM EDT ----- Eustaquio Maize, his hgb is very slowly coming up. I know he has had some minor hemorrhoidal bleeding since hospital discharge. Please find out if still having any bleeding. He could use some short term iron. Pease give in ferrous sulfate 325mg  for 30 days.  Check cbc and ferritin in one month. Ask him to add daily Mirlax to constipation regimen while on iron.  Thanks

## 2017-01-02 NOTE — Telephone Encounter (Signed)
[name], if adding the fiber wasn't enough then of course he can add the miralax. He can even take it twice a day if needed. Will await next CBC. If he has rectal bleeding in the interim then we need to know about it. Thanks

## 2017-01-02 NOTE — Telephone Encounter (Signed)
Noted. Shared with the patient.

## 2017-01-04 ENCOUNTER — Ambulatory Visit (INDEPENDENT_AMBULATORY_CARE_PROVIDER_SITE_OTHER): Payer: Medicare Other | Admitting: Internal Medicine

## 2017-01-04 ENCOUNTER — Encounter: Payer: Self-pay | Admitting: Internal Medicine

## 2017-01-04 ENCOUNTER — Other Ambulatory Visit: Payer: Self-pay | Admitting: Internal Medicine

## 2017-01-04 ENCOUNTER — Telehealth: Payer: Self-pay | Admitting: Internal Medicine

## 2017-01-04 VITALS — BP 148/82 | HR 73 | Temp 98.5°F | Wt 209.2 lb

## 2017-01-04 DIAGNOSIS — K922 Gastrointestinal hemorrhage, unspecified: Secondary | ICD-10-CM | POA: Diagnosis not present

## 2017-01-04 DIAGNOSIS — K642 Third degree hemorrhoids: Secondary | ICD-10-CM | POA: Diagnosis not present

## 2017-01-04 LAB — CBC WITH DIFFERENTIAL/PLATELET
BASOS PCT: 1.2 % (ref 0.0–3.0)
Basophils Absolute: 0.1 10*3/uL (ref 0.0–0.1)
EOS PCT: 2.5 % (ref 0.0–5.0)
Eosinophils Absolute: 0.2 10*3/uL (ref 0.0–0.7)
HCT: 25.9 % — ABNORMAL LOW (ref 39.0–52.0)
Hemoglobin: 8.4 g/dL — ABNORMAL LOW (ref 13.0–17.0)
LYMPHS ABS: 1.3 10*3/uL (ref 0.7–4.0)
Lymphocytes Relative: 19.7 % (ref 12.0–46.0)
MCHC: 32.5 g/dL (ref 30.0–36.0)
MCV: 80.4 fl (ref 78.0–100.0)
MONO ABS: 0.5 10*3/uL (ref 0.1–1.0)
Monocytes Relative: 7.1 % (ref 3.0–12.0)
NEUTROS PCT: 69.5 % (ref 43.0–77.0)
Neutro Abs: 4.5 10*3/uL (ref 1.4–7.7)
Platelets: 430 10*3/uL — ABNORMAL HIGH (ref 150.0–400.0)
RBC: 3.22 Mil/uL — ABNORMAL LOW (ref 4.22–5.81)
RDW: 16.1 % — AB (ref 11.5–15.5)
WBC: 6.5 10*3/uL (ref 4.0–10.5)

## 2017-01-04 MED ORDER — METHYLPHENIDATE HCL 5 MG PO TABS: 5.0000 mg | ORAL_TABLET | Freq: Two times a day (BID) | ORAL | 0 refills | Status: DC

## 2017-01-04 MED ORDER — ESCITALOPRAM OXALATE 20 MG PO TABS: 20.0000 mg | ORAL_TABLET | Freq: Every morning | ORAL | 0 refills | Status: DC

## 2017-01-04 NOTE — Telephone Encounter (Signed)
Received referral for patient to come in for Prolapsed internal hemorrhoids. Per pcp patient might need repeat banding. Does patient need ov first or just sch banding? Please advise

## 2017-01-04 NOTE — Progress Notes (Signed)
Subjective:    Patient ID: Randy Land., male    DOB: [birth year], 76 y.o.   MRN: [id number]  HPI Here with wife for follow up of rectal bleeding Ongoing bleeding when wiping--but not in bowl Wife is concerned that he looks very pale  Appetite is good No abdominal pain No N/V Still constipated--goes every 3 days despite the miralax  Some dizziness  Current Outpatient Prescriptions on File Prior to Visit  Medication Sig Dispense Refill  . atorvastatin (LIPITOR) 20 MG tablet TAKE 1 TABLET BY MOUTH ONCE DAILY 90 tablet 2  . escitalopram (LEXAPRO) 20 MG tablet TAKE 2 TABLETS BY MOUTH EVERY MORNING (Patient taking differently: TAKE 1 TABLET BY MOUTH EVERY MORNING) 180 tablet 2  . gabapentin (NEURONTIN) 300 MG capsule Take 1-2 capsules (300-600 mg total) by mouth at bedtime. (Patient taking differently: Take 600 mg by mouth at bedtime. ) 60 capsule 11  . glipiZIDE (GLUCOTROL) 5 MG tablet TAKE 1 TABLET BY MOUTH TWICE A DAY BEFORE A MEAL 180 tablet 2  . glucose blood test strip Dx: 250.40 100 each 6  . hydrocortisone (ANUSOL-HC) 2.5 % rectal cream Place 1 application rectally at bedtime. For 7 days 30 g 1  . levothyroxine (SYNTHROID, LEVOTHROID) 75 MCG tablet TAKE 1 TABLET BY MOUTH ONCE A DAY 90 tablet 3  . lisinopril (PRINIVIL,ZESTRIL) 10 MG tablet TAKE 1 TABLET BY MOUTH ONCE DAILY 90 tablet 1  . metFORMIN (GLUCOPHAGE) 500 MG tablet TAKE 1 TABLET BY MOUTH 3 TIMES DAILY 270 tablet 3  . methylphenidate (RITALIN) 5 MG tablet Take 1 tablet (5 mg total) by mouth 2 (two) times daily. Fill in about 1 month (Patient taking differently: Take 5 mg by mouth daily. Fill in about 1 month) 60 tablet 0  . OLANZapine (ZYPREXA) 10 MG tablet TAKE 1 TABLET BY MOUTH AT BEDTIME 30 tablet 5  . vitamin B-12 (CYANOCOBALAMIN) 1000 MCG tablet Take 1,000 mcg by mouth daily.       No current facility-administered medications on file prior to visit.     Allergies  Allergen Reactions  . Vytorin  [Ezetimibe-Simvastatin]     REACTION: nausea    Past Medical History:  Diagnosis Date  . Anxiety   . Depression    recurrent and severe  . Diabetes mellitus    Type II with nephropathy  . GI bleed   . HLD (hyperlipidemia)   . Hypothyroidism   . Internal hemorrhoid   . Osteoarthritis   . Personal history of colonic adenoma 12/03/2012    Past Surgical History:  Procedure Laterality Date  . COLONOSCOPY    . COLONOSCOPY WITH PROPOFOL N/A 12/20/2016   Procedure: COLONOSCOPY WITH PROPOFOL;  Surgeon: Jonathon Bellows, MD;  Location: [facility] ENDOSCOPY;  Service: Endoscopy;  Laterality: N/A;  . ESOPHAGOGASTRODUODENOSCOPY (EGD) WITH PROPOFOL N/A 12/17/2016   Procedure: ESOPHAGOGASTRODUODENOSCOPY (EGD) WITH PROPOFOL;  Surgeon: Wilford Corner, MD;  Location: [facility] ENDOSCOPY;  Service: Endoscopy;  Laterality: N/A;  . FLEXIBLE SIGMOIDOSCOPY N/A 12/17/2016   Procedure: FLEXIBLE SIGMOIDOSCOPY;  Surgeon: Wilford Corner, MD;  Location: [facility] ENDOSCOPY;  Service: Endoscopy;  Laterality: N/A;  . HEMORRHOID BANDING    . TOTAL KNEE ARTHROPLASTY  11/09   Right-Dr. Clair Gulling ([facility])  . TOTAL KNEE ARTHROPLASTY  6/10   Left-Dr. Clair Gulling ([facility])  . UMBILICAL HERNIA REPAIR  2000    Family History  Problem Relation Age of Onset  . Heart failure Father   . Diabetes Father   . Depression Father   .  Coronary artery disease Father   . Breast cancer Sister   . Lung cancer Sister   . Depression Sister   . Stomach cancer Mother   . Stroke      Paternal side  . Parkinsonism      Cousin  . Colon cancer Neg Hx   . Rectal cancer Neg Hx   . Esophageal cancer Neg Hx   . Colon polyps Neg Hx     Social History   Social History  . Marital status: Married    Spouse name: [name]  . Number of children: 2  . Years of education: N/A   Occupational History  . Retired    Social History Main Topics  . Smoking status: Never Smoker  . Smokeless tobacco: Former Systems developer    Types: Chew  . Alcohol use No  . Drug use: No  .  Sexual activity: Not on file   Other Topics Concern  . Not on file   Social History Narrative   Has living will.   No formal health care POA---requests wife to be   Would accept resuscitation attempts   No tube feeds if cognitively unaware   Review of Systems Sleeping fairly well Weight stable Neuropathy much worse--hard to even sit up for a while Depression okay--generally just taking the methylphenidate once a month    Objective:   Physical Exam  Constitutional: No distress.  Pale appearing  Abdominal: Soft. He exhibits no distension. There is no tenderness. There is no rebound and no guarding.          Assessment & Plan:

## 2017-01-04 NOTE — Assessment & Plan Note (Signed)
Mostly resolved but still wipes blood Will recheck CBC Start OTC iron

## 2017-01-04 NOTE — Assessment & Plan Note (Signed)
Will refer back to Dr Carlean Purl for repeat banding after such a severe bleed

## 2017-01-04 NOTE — Telephone Encounter (Signed)
Last office visit 01/04/2017.  Last refilled 11/20/2015 for #60 with 11 refills.  Ok to refill?

## 2017-01-04 NOTE — Progress Notes (Signed)
Pre visit review using our clinic review tool, if applicable. No additional management support is needed unless otherwise documented below in the visit note. 

## 2017-01-04 NOTE — Telephone Encounter (Signed)
Patient has been scheduled

## 2017-01-04 NOTE — Patient Instructions (Signed)
Please try an over the counter iron pill---either one adult or 2 chewable children's iron tabs daily. Increase the miralax to twice a day to help prevent constipation.

## 2017-01-05 NOTE — Telephone Encounter (Signed)
Approved: okay for 1 year 

## 2017-01-09 ENCOUNTER — Encounter: Payer: Medicare Other | Admitting: Internal Medicine

## 2017-01-09 ENCOUNTER — Ambulatory Visit: Payer: Medicare Other | Admitting: Internal Medicine

## 2017-02-01 ENCOUNTER — Other Ambulatory Visit (INDEPENDENT_AMBULATORY_CARE_PROVIDER_SITE_OTHER): Payer: Medicare Other

## 2017-02-01 ENCOUNTER — Encounter: Payer: Self-pay | Admitting: Internal Medicine

## 2017-02-01 ENCOUNTER — Ambulatory Visit (INDEPENDENT_AMBULATORY_CARE_PROVIDER_SITE_OTHER): Payer: Medicare Other | Admitting: Internal Medicine

## 2017-02-01 DIAGNOSIS — D62 Acute posthemorrhagic anemia: Secondary | ICD-10-CM | POA: Diagnosis not present

## 2017-02-01 DIAGNOSIS — K642 Third degree hemorrhoids: Secondary | ICD-10-CM | POA: Diagnosis not present

## 2017-02-01 LAB — CBC WITH DIFFERENTIAL/PLATELET
BASOS PCT: 0.9 % (ref 0.0–3.0)
Basophils Absolute: 0.1 10*3/uL (ref 0.0–0.1)
EOS PCT: 2.6 % (ref 0.0–5.0)
Eosinophils Absolute: 0.2 10*3/uL (ref 0.0–0.7)
HCT: 31.9 % — ABNORMAL LOW (ref 39.0–52.0)
Hemoglobin: 10.3 g/dL — ABNORMAL LOW (ref 13.0–17.0)
LYMPHS ABS: 1.7 10*3/uL (ref 0.7–4.0)
Lymphocytes Relative: 21.6 % (ref 12.0–46.0)
MCHC: 32.3 g/dL (ref 30.0–36.0)
MCV: 81.5 fl (ref 78.0–100.0)
MONO ABS: 0.7 10*3/uL (ref 0.1–1.0)
Monocytes Relative: 8.4 % (ref 3.0–12.0)
NEUTROS PCT: 66.5 % (ref 43.0–77.0)
Neutro Abs: 5.2 10*3/uL (ref 1.4–7.7)
Platelets: 355 10*3/uL (ref 150.0–400.0)
RBC: 3.92 Mil/uL — ABNORMAL LOW (ref 4.22–5.81)
RDW: 20.9 % — AB (ref 11.5–15.5)
WBC: 7.8 10*3/uL (ref 4.0–10.5)

## 2017-02-01 NOTE — Progress Notes (Signed)
   HEMORRHOID BANDING Prior hemorrhoid banding 2017 Gr 3  - improved but now w/ recurrent rectal bleeding and prolapse sxs  He was recently admitted at [facility] - hematochezia and acute blood loss anemia - flex sig and attempted colonoscopy (bad prep) showed hemorrhoids and diverticulosis.  CBC Latest Ref Rng & Units 02/01/2017 01/04/2017 12/26/2016  WBC 4.0 - 10.5 K/uL 7.8 6.5 9.7  Hemoglobin 13.0 - 17.0 g/dL 10.3(L) 8.4 aL(L) 8.8 Repeated and verified X2.(L)  Hematocrit 39.0 - 52.0 % 31.9(L) 25.9 aL(L) 27.2(L)  Platelets 150.0 - 400.0 K/uL 355.0 430.0(H) 363.0      Anoscopy was performed with the patient in the left lateral decubitus position while a chaperone was present and revealed Gr 2-3 LL and Gr 2 RA and RP internal hemorrhoids  PROCEDURE NOTE: The patient presents with symptomatic grade 2-3  hemorrhoids, requesting rubber band ligation of his/her hemorrhoidal disease.  All risks, benefits and alternative forms of therapy were described and informed consent was obtained.  The anorectum was pre-medicated with 0.125%NTG and 5% lidocaine topical The decision was made to band the RA and LL internal hemorrhoid, and the [place] was used to perform band ligation without complication.  Digital anorectal examination was then performed to assure proper positioning of the band, and to adjust the banded tissue as required.  The patient was discharged home without pain or other issues.  Dietary and behavioral recommendations were given and along with follow-up instructions.    The patient will return in a few weeks for  follow-up and possible additional banding as required. No complications were encountered and the patient tolerated the procedure well.    Check CBC today

## 2017-02-01 NOTE — Assessment & Plan Note (Signed)
CBC Only on half iron tablet de to constipation side effects

## 2017-02-01 NOTE — Progress Notes (Signed)
Hemoglobin better Ask him to see if he can take 1 slow Fe daily - can get OTC and may not cause too much constipation

## 2017-02-01 NOTE — Patient Instructions (Addendum)
Your physician has requested that you go to the basement for the following lab work before leaving today: CBC/diff   We will see you back on July 3rd at 3:00pm.  [place]   1. The procedure you have had should have been relatively painless since the banding of the area involved does not have nerve endings and there is no pain sensation.  The rubber band cuts off the blood supply to the hemorrhoid and the band may fall off as soon as 48 hours after the banding (the band may occasionally be seen in the toilet bowl following a bowel movement). You may notice a temporary feeling of fullness in the rectum which should respond adequately to plain Tylenol or Motrin.  2. Following the banding, avoid strenuous exercise that evening and resume full activity the next day.  A sitz bath (soaking in a warm tub) or bidet is soothing, and can be useful for cleansing the area after bowel movements.     3. To avoid constipation, take two tablespoons of natural wheat bran, natural oat bran, flax, Benefiber or any over the counter fiber supplement and increase your water intake to 7-8 glasses daily.    4. Unless you have been prescribed anorectal medication, do not put anything inside your rectum for two weeks: No suppositories, enemas, fingers, etc.  5. Occasionally, you may have more bleeding than usual after the banding procedure.  This is often from the untreated hemorrhoids rather than the treated one.  Don't be concerned if there is a tablespoon or so of blood.  If there is more blood than this, lie flat with your bottom higher than your head and apply an ice pack to the area. If the bleeding does not stop within a half an hour or if you feel faint, call our office at [phone] or go to the emergency room.  6. Problems are not common; however, if there is a substantial amount of bleeding, severe pain, chills, fever or difficulty passing urine (very rare) or  other problems, you should call us at [phone] or report to the nearest emergency room.  7. Do not stay seated continuously for more than 2-3 hours for a day or two after the procedure.  Tighten your buttock muscles 10-15 times every two hours and take 10-15 deep breaths every 1-2 hours.  Do not spend more than a few minutes on the toilet if you cannot empty your bowel; instead re-visit the toilet at a later time.        I appreciate the opportunity to care for you. Silvano Rusk, MD, [facility]

## 2017-02-13 ENCOUNTER — Other Ambulatory Visit: Payer: Self-pay | Admitting: Internal Medicine

## 2017-02-22 ENCOUNTER — Telehealth: Payer: Self-pay

## 2017-02-22 NOTE — Telephone Encounter (Signed)
Misty RN case mgr with [employer] left v/m requesting last BP reading. Pt is in the diabetic program. Date 01/04/17 BP 148/82; per instructions left information with Misty's v/m.

## 2017-02-28 ENCOUNTER — Ambulatory Visit (INDEPENDENT_AMBULATORY_CARE_PROVIDER_SITE_OTHER): Payer: Medicare Other | Admitting: Internal Medicine

## 2017-02-28 ENCOUNTER — Encounter: Payer: Self-pay | Admitting: Internal Medicine

## 2017-02-28 ENCOUNTER — Other Ambulatory Visit (INDEPENDENT_AMBULATORY_CARE_PROVIDER_SITE_OTHER): Payer: Medicare Other

## 2017-02-28 DIAGNOSIS — K642 Third degree hemorrhoids: Secondary | ICD-10-CM

## 2017-02-28 DIAGNOSIS — D62 Acute posthemorrhagic anemia: Secondary | ICD-10-CM

## 2017-02-28 LAB — CBC
HCT: 35.1 % — ABNORMAL LOW (ref 39.0–52.0)
HEMOGLOBIN: 11.4 g/dL — AB (ref 13.0–17.0)
MCHC: 32.6 g/dL (ref 30.0–36.0)
MCV: 84.2 fl (ref 78.0–100.0)
Platelets: 341 10*3/uL (ref 150.0–400.0)
RBC: 4.17 Mil/uL — ABNORMAL LOW (ref 4.22–5.81)
RDW: 20.3 % — AB (ref 11.5–15.5)
WBC: 7.7 10*3/uL (ref 4.0–10.5)

## 2017-02-28 NOTE — Progress Notes (Signed)
Randy Mcdaniel. 76 y.o. [birth year] [id number]  Assessment & Plan:   Prolapsed internal hemorrhoids, grade 3 Better reassess in 1-2 mos and see about more banding but there is an external component  Acute posthemorrhagic anemia CBC  CBC Latest Ref Rng & Units 02/28/2017 02/01/2017 01/04/2017  WBC 4.0 - 10.5 K/uL 7.7 7.8 6.5  Hemoglobin 13.0 - 17.0 g/dL 11.4(L) 10.3(L) 8.4 aL(L)  Hematocrit 39.0 - 52.0 % 35.1(L) 31.9(L) 25.9 aL(L)  Platelets 150.0 - 400.0 K/uL 341.0 355.0 430.0(H)   Hgb continues to improve - ha has not taken iron due to constipation fears I appreciate the opportunity to care for this patient.  OT:LXBWIO, Theophilus Kinds, MD  Subjective:   Chief Complaint: hemorrhoids  HPI Patient is here about 1 month after repeat hemorrhoidal banding of LL and RA hemorrhoids. As best I can tell there is less bleeding and less seepage but still some bleeding and difficulty cleansing. He would like to have all symptoms resolved. "Can you fix them again?" 2-3 soft stools a day. No straining.  Allergies  Allergen Reactions  . Vytorin [Ezetimibe-Simvastatin]     REACTION: nausea   Current Meds  Medication Sig  . atorvastatin (LIPITOR) 20 MG tablet TAKE 1 TABLET BY MOUTH ONCE DAILY  . escitalopram (LEXAPRO) 20 MG tablet Take 1 tablet (20 mg total) by mouth every morning.  . gabapentin (NEURONTIN) 300 MG capsule TAKE 1 TO 2 CAPSULES BY MOUTH AT BEDTIME  . glipiZIDE (GLUCOTROL) 5 MG tablet TAKE 1 TABLET BY MOUTH TWICE A DAY BEFORE A MEAL  . glucose blood test strip Dx: 250.40  . hydrocortisone (ANUSOL-HC) 2.5 % rectal cream Place 1 application rectally at bedtime. For 7 days  . levothyroxine (SYNTHROID, LEVOTHROID) 75 MCG tablet TAKE 1 TABLET BY MOUTH ONCE A DAY  . lisinopril (PRINIVIL,ZESTRIL) 10 MG tablet TAKE 1 TABLET BY MOUTH ONCE DAILY  . metFORMIN (GLUCOPHAGE) 500 MG tablet TAKE 1 TABLET BY MOUTH 3 TIMES DAILY  . methylphenidate (RITALIN) 5 MG tablet Take 1 tablet (5 mg  total) by mouth 2 (two) times daily.  Marland Kitchen OLANZapine (ZYPREXA) 10 MG tablet TAKE 1 TABLET BY MOUTH AT BEDTIME  . vitamin B-12 (CYANOCOBALAMIN) 1000 MCG tablet Take 1,000 mcg by mouth daily.     Past Medical History:  Diagnosis Date  . Anxiety   . Depression    recurrent and severe  . Diabetes mellitus    Type II with nephropathy  . GI bleed   . HLD (hyperlipidemia)   . Hypothyroidism   . Internal hemorrhoid   . Osteoarthritis   . Personal history of colonic adenoma 12/03/2012   Past Surgical History:  Procedure Laterality Date  . COLONOSCOPY    . COLONOSCOPY WITH PROPOFOL N/A 12/20/2016   Procedure: COLONOSCOPY WITH PROPOFOL;  Surgeon: Jonathon Bellows, MD;  [facility] ENDOSCOPY;  Service: Endoscopy;  Laterality: N/A;  . ESOPHAGOGASTRODUODENOSCOPY (EGD) WITH PROPOFOL N/A 12/17/2016   Procedure: ESOPHAGOGASTRODUODENOSCOPY (EGD) WITH PROPOFOL;  Surgeon: Wilford Corner, MD;  Location: [facility] ENDOSCOPY;  Service: Endoscopy;  Laterality: N/A;  . FLEXIBLE SIGMOIDOSCOPY N/A 12/17/2016   Procedure: FLEXIBLE SIGMOIDOSCOPY;  Surgeon: Wilford Corner, MD;  Location: [facility] ENDOSCOPY;  Service: Endoscopy;  Laterality: N/A;  . HEMORRHOID BANDING    . TOTAL KNEE ARTHROPLASTY  11/09   Right-Dr. Clair Gulling ([facility])  . TOTAL KNEE ARTHROPLASTY  6/10   Left-Dr. Clair Gulling ([facility])  . UMBILICAL HERNIA REPAIR  2000   Review of Systems Back pain, uses a cane to walk.  Objective:   Physical Exam BP 134/70   Pulse 100   Ht 5\' 10"  (1.778 m)   Wt 204 lb (92.5 kg)   BMI 29.27 kg/m  Elderly wm NAD Moves slowly Rectal - + fleshy tags - small, decreased sphincter tone with soft brown stool Anoscopy shows improved RA and LL hemorrhoids there are internal and external components - RA seems Grade 1 and LL Grade 2. RP Grade 1. Small post-banding ulcer in RA pile

## 2017-02-28 NOTE — Assessment & Plan Note (Signed)
CBC

## 2017-02-28 NOTE — Patient Instructions (Addendum)
Your physician has requested that you go to the basement for the following lab work before leaving today:. CBC  You have been scheduled for a follow up appointment with Dr. Carlean Purl on August 10 at 2:45pm.   I appreciate the opportunity to care for you. Silvano Rusk, MD, [facility]

## 2017-02-28 NOTE — Assessment & Plan Note (Signed)
Better reassess in 1-2 mos and see about more banding but there is an external component

## 2017-03-02 NOTE — Progress Notes (Signed)
Hemoglobin better gain. Will see him in f/u as planned Try to eat iron rich foods - he does not tolerate oral irin supplements due to constipation

## 2017-03-27 ENCOUNTER — Other Ambulatory Visit: Payer: Self-pay | Admitting: *Deleted

## 2017-03-27 ENCOUNTER — Telehealth: Payer: Self-pay | Admitting: Internal Medicine

## 2017-03-27 MED ORDER — METHYLPHENIDATE HCL 5 MG PO TABS: 5.0000 mg | ORAL_TABLET | Freq: Two times a day (BID) | ORAL | 0 refills | Status: DC

## 2017-03-27 NOTE — Telephone Encounter (Signed)
Spoke to pt's wife. They will come get rx.

## 2017-03-27 NOTE — Telephone Encounter (Signed)
Patient called requesting a refill on Ritalin Call when ready for pickup.

## 2017-03-27 NOTE — Telephone Encounter (Signed)
Spoke to [place]. That rx should be shredded. We have the new rx here that Dr Silvio Pate just sign for pt to come pickup. She previous telephone encounter for the refill information.

## 2017-03-27 NOTE — Telephone Encounter (Signed)
Chris from [facility] called - the ritalin prescription that the pt turned in today is dated 05/06/2016.  It shows in epic that the date is today,  please call Gerald Stabs at [facility]. Thanks

## 2017-04-10 ENCOUNTER — Other Ambulatory Visit: Payer: Self-pay | Admitting: Internal Medicine

## 2017-04-29 HISTORY — PX: HEMORRHOID BANDING: SHX5850

## 2017-05-08 ENCOUNTER — Encounter: Payer: Self-pay | Admitting: Internal Medicine

## 2017-05-08 ENCOUNTER — Ambulatory Visit (INDEPENDENT_AMBULATORY_CARE_PROVIDER_SITE_OTHER): Payer: Medicare Other | Admitting: Internal Medicine

## 2017-05-08 ENCOUNTER — Other Ambulatory Visit (INDEPENDENT_AMBULATORY_CARE_PROVIDER_SITE_OTHER): Payer: Medicare Other

## 2017-05-08 VITALS — BP 112/50 | HR 94 | Ht 70.0 in | Wt 199.0 lb

## 2017-05-08 DIAGNOSIS — D500 Iron deficiency anemia secondary to blood loss (chronic): Secondary | ICD-10-CM

## 2017-05-08 DIAGNOSIS — K642 Third degree hemorrhoids: Secondary | ICD-10-CM

## 2017-05-08 LAB — CBC WITH DIFFERENTIAL/PLATELET
Basophils Absolute: 0.1 10*3/uL (ref 0.0–0.1)
Basophils Relative: 1.1 % (ref 0.0–3.0)
EOS PCT: 2.4 % (ref 0.0–5.0)
Eosinophils Absolute: 0.1 10*3/uL (ref 0.0–0.7)
HCT: 18.8 % — CL (ref 39.0–52.0)
Hemoglobin: 6 g/dL — CL (ref 13.0–17.0)
LYMPHS ABS: 1.1 10*3/uL (ref 0.7–4.0)
Lymphocytes Relative: 19.1 % (ref 12.0–46.0)
MCHC: 32 g/dL (ref 30.0–36.0)
MCV: 90.8 fl (ref 78.0–100.0)
MONO ABS: 0.4 10*3/uL (ref 0.1–1.0)
MONOS PCT: 6.2 % (ref 3.0–12.0)
NEUTROS ABS: 4.1 10*3/uL (ref 1.4–7.7)
NEUTROS PCT: 71.2 % (ref 43.0–77.0)
PLATELETS: 421 10*3/uL — AB (ref 150.0–400.0)
RBC: 2.07 Mil/uL — AB (ref 4.22–5.81)
RDW: 15.7 % — AB (ref 11.5–15.5)
WBC: 5.7 10*3/uL (ref 4.0–10.5)

## 2017-05-08 LAB — FERRITIN: FERRITIN: 8.5 ng/mL — AB (ref 22.0–322.0)

## 2017-05-08 NOTE — Patient Instructions (Addendum)
HEMORRHOID BANDING PROCEDURE    FOLLOW-UP CARE   1. The procedure you have had should have been relatively painless since the banding of the area involved does not have nerve endings and there is no pain sensation.  The rubber band cuts off the blood supply to the hemorrhoid and the band may fall off as soon as 48 hours after the banding (the band may occasionally be seen in the toilet bowl following a bowel movement). You may notice a temporary feeling of fullness in the rectum which should respond adequately to plain Tylenol or Motrin.  2. Following the banding, avoid strenuous exercise that evening and resume full activity the next day.  A sitz bath (soaking in a warm tub) or bidet is soothing, and can be useful for cleansing the area after bowel movements.     3. To avoid constipation, take two tablespoons of natural wheat bran, natural oat bran, flax, Benefiber or any over the counter fiber supplement and increase your water intake to 7-8 glasses daily.    4. Unless you have been prescribed anorectal medication, do not put anything inside your rectum for two weeks: No suppositories, enemas, fingers, etc.  5. Occasionally, you may have more bleeding than usual after the banding procedure.  This is often from the untreated hemorrhoids rather than the treated one.  Don't be concerned if there is a tablespoon or so of blood.  If there is more blood than this, lie flat with your bottom higher than your head and apply an ice pack to the area. If the bleeding does not stop within a half an hour or if you feel faint, call our office at [phone] or go to the emergency room.  6. Problems are not common; however, if there is a substantial amount of bleeding, severe pain, chills, fever or difficulty passing urine (very rare) or other problems, you should call us at [phone] or report to the nearest emergency room.  7. Do not stay seated continuously for more than 2-3 hours for a day or two  after the procedure.  Tighten your buttock muscles 10-15 times every two hours and take 10-15 deep breaths every 1-2 hours.  Do not spend more than a few minutes on the toilet if you cannot empty your bowel; instead re-visit the toilet at a later time.    Your physician has requested that you go to the basement for the following lab work before leaving today: CBC/diff, Ferritin  Please continue your metamucil and Citrucel.   Please follow up with Dr Carlean Purl in 2 months.   I appreciate the opportunity to care for you. Silvano Rusk, MD, [facility]

## 2017-05-08 NOTE — Progress Notes (Signed)
    Hemorrhoid ligation Sxs: bleeding this was better after previous multiple ligations but within the past month he said he had bleeding for 30 minutes. He had done well after I saw him in early July and redid repeat banding. He doesn't feel like the hemorrhoids are protruding but he has had bleeding again. He is panel and weak and family is concerned that he is anemic again. His wife is with him.  She reports that he'll use the Metamucil for a while and I'll quit it he says he just doesn't like to take it because he wishes he didn't have to take things all the time. He says he is compliant with his stool softener.  Anoscopy was performed with the patient in the left lateral decubitus position while a chaperone was present and revealed grade 2 inflamed hemorrhoids right posterior and left lateral and a grade one right anterior   PROCEDURE NOTE: The patient presents with symptomatic grade 2  hemorrhoids, requesting rubber band ligation of his/her hemorrhoidal disease.  All risks, benefits and alternative forms of therapy were described and informed consent was obtained.   The anorectum was pre-medicated with 0.125% nitroglycerin and 5% lidocaine The decision was made to band the RP and LL internal hemorrhoid, and the [place] was used to perform band ligation without complication.  Digital anorectal examination was then performed to assure proper positioning of the band, and to adjust the banded tissue as required.  The patient was discharged home without pain or other issues.  Dietary and behavioral recommendations were given and along with follow-up instructions.     The following adjunctive treatments were recommended:  Take metamucil and stool softener CBC + ferritin today -   CBC Latest Ref Rng & Units 05/08/2017 02/28/2017 02/01/2017  WBC 4.0 - 10.5 K/uL 5.7 7.7 7.8  Hemoglobin 13.0 - 17.0 g/dL 6.0 Repeated and verified X2.(LL) 11.4(L) 10.3(L)  Hematocrit 39.0 - 52.0 % 18.8  Repeated and verified X2.(LL) 35.1(L) 31.9(L)  Platelets 150.0 - 400.0 K/uL 421.0(H) 341.0 355.0   Lab Results  Component Value Date   FERRITIN 8.5 (L) 05/08/2017   These labs of come in since I seen him. He needs another blood transfusion which we will arrange hopefully via short stay versus an observation admission but may need to do that. He needs Feraheme also after the blood transfusion which I can arrange. He has failed oral iron.  The patient will return in 2 months for  follow-up and possible additional banding as required. No complications were encountered and the patient tolerated the procedure well.  May need to see syrgeon if this does not work, there is no limited to the number of banding's we can do but it seems like he is not responding as well as one would like. He has had multiple colonoscopic evaluations since 2014 none of which have been revealing other than hemorrhoids and diverticulosis. He may have had diverticular bleeding in August. In April he had an EGD when he was bleeding at [facility] and did not have any major findings.  CC: Venia Carbon, MD

## 2017-05-08 NOTE — Progress Notes (Signed)
Hemoglobin and iron are low. He needs a transfusion of 2 units of packed red cells. I called him he is stable without chest pain or anything like that, he is weak obviously. Let's try to get 2 units of packed red cells each over 2-3 hours in short stay tomorrow or the day after. If that cannot be arranged he'll need to go to the emergency room and hopefully get transfuse there or through an observation admission.  Once that is done I want him to have a Feraheme infusion 2 starting next week with a CBC and ferritin 1 month after the last Feraheme.

## 2017-05-09 ENCOUNTER — Telehealth: Payer: Self-pay

## 2017-05-09 ENCOUNTER — Observation Stay
Admission: EM | Admit: 2017-05-09 | Discharge: 2017-05-10 | Disposition: A | Discharge status: Home / Self Care | Payer: Medicare Other | Attending: Internal Medicine | Admitting: Internal Medicine

## 2017-05-09 ENCOUNTER — Telehealth: Payer: Self-pay | Admitting: Internal Medicine

## 2017-05-09 DIAGNOSIS — Z7984 Long term (current) use of oral hypoglycemic drugs: Secondary | ICD-10-CM | POA: Diagnosis not present

## 2017-05-09 DIAGNOSIS — M1990 Unspecified osteoarthritis, unspecified site: Secondary | ICD-10-CM | POA: Insufficient documentation

## 2017-05-09 DIAGNOSIS — K649 Unspecified hemorrhoids: Secondary | ICD-10-CM | POA: Insufficient documentation

## 2017-05-09 DIAGNOSIS — D62 Acute posthemorrhagic anemia: Secondary | ICD-10-CM | POA: Diagnosis present

## 2017-05-09 DIAGNOSIS — F329 Major depressive disorder, single episode, unspecified: Secondary | ICD-10-CM | POA: Insufficient documentation

## 2017-05-09 DIAGNOSIS — Z888 Allergy status to other drugs, medicaments and biological substances status: Secondary | ICD-10-CM | POA: Diagnosis not present

## 2017-05-09 DIAGNOSIS — E039 Hypothyroidism, unspecified: Secondary | ICD-10-CM | POA: Insufficient documentation

## 2017-05-09 DIAGNOSIS — K648 Other hemorrhoids: Secondary | ICD-10-CM | POA: Diagnosis not present

## 2017-05-09 DIAGNOSIS — D649 Anemia, unspecified: Secondary | ICD-10-CM | POA: Diagnosis present

## 2017-05-09 DIAGNOSIS — F419 Anxiety disorder, unspecified: Secondary | ICD-10-CM | POA: Insufficient documentation

## 2017-05-09 DIAGNOSIS — I9589 Other hypotension: Secondary | ICD-10-CM

## 2017-05-09 DIAGNOSIS — E785 Hyperlipidemia, unspecified: Secondary | ICD-10-CM | POA: Diagnosis not present

## 2017-05-09 DIAGNOSIS — Z66 Do not resuscitate: Secondary | ICD-10-CM | POA: Insufficient documentation

## 2017-05-09 DIAGNOSIS — E1121 Type 2 diabetes mellitus with diabetic nephropathy: Secondary | ICD-10-CM | POA: Diagnosis not present

## 2017-05-09 DIAGNOSIS — Z79899 Other long term (current) drug therapy: Secondary | ICD-10-CM | POA: Diagnosis not present

## 2017-05-09 DIAGNOSIS — D500 Iron deficiency anemia secondary to blood loss (chronic): Secondary | ICD-10-CM

## 2017-05-09 LAB — COMPREHENSIVE METABOLIC PANEL
ALBUMIN: 3.7 g/dL (ref 3.5–5.0)
ALT: 17 U/L (ref 17–63)
AST: 35 U/L (ref 15–41)
Alkaline Phosphatase: 62 U/L (ref 38–126)
Anion gap: 8 (ref 5–15)
BUN: 24 mg/dL — AB (ref 6–20)
CHLORIDE: 104 mmol/L (ref 101–111)
CO2: 22 mmol/L (ref 22–32)
CREATININE: 1.28 mg/dL — AB (ref 0.61–1.24)
Calcium: 9.1 mg/dL (ref 8.9–10.3)
GFR calc Af Amer: >60 mL/min (ref >60)
GFR, EST NON AFRICAN AMERICAN: 52 mL/min — AB (ref >60)
GLUCOSE: 206 mg/dL — AB (ref 65–99)
Potassium: 4.7 mmol/L (ref 3.5–5.1)
Sodium: 134 mmol/L — ABNORMAL LOW (ref 135–145)
Total Bilirubin: 0.3 mg/dL (ref 0.3–1.2)
Total Protein: 6.6 g/dL (ref 6.5–8.1)

## 2017-05-09 LAB — CBC
HCT: 19.7 % — ABNORMAL LOW (ref 40.0–52.0)
Hemoglobin: 6.6 g/dL — ABNORMAL LOW (ref 13.0–18.0)
MCH: 30 pg (ref 26.0–34.0)
MCHC: 33.4 g/dL (ref 32.0–36.0)
MCV: 89.9 fL (ref 80.0–100.0)
PLATELETS: 432 10*3/uL (ref 150–440)
RBC: 2.19 MIL/uL — ABNORMAL LOW (ref 4.40–5.90)
RDW: 16 % — AB (ref 11.5–14.5)
WBC: 6.7 10*3/uL (ref 3.8–10.6)

## 2017-05-09 LAB — GLUCOSE, CAPILLARY
Glucose-Capillary: 155 mg/dL — ABNORMAL HIGH (ref 65–99)
Glucose-Capillary: 186 mg/dL — ABNORMAL HIGH (ref 65–99)

## 2017-05-09 LAB — PROTIME-INR
INR: 1.02
Prothrombin Time: 13.3 seconds (ref 11.4–15.2)

## 2017-05-09 LAB — PREPARE RBC (CROSSMATCH)

## 2017-05-09 MED ORDER — ESCITALOPRAM OXALATE 10 MG PO TABS
20.0000 mg | ORAL_TABLET | Freq: Every morning | ORAL | Status: DC
  Administered 2017-05-10: 20 mg via ORAL
  Filled 2017-05-09: qty 2

## 2017-05-09 MED ORDER — LEVOTHYROXINE SODIUM 50 MCG PO TABS
75.0000 ug | ORAL_TABLET | Freq: Every day | ORAL | Status: DC
  Administered 2017-05-10: 75 ug via ORAL
  Filled 2017-05-09: qty 2

## 2017-05-09 MED ORDER — OLANZAPINE 10 MG PO TABS
10.0000 mg | ORAL_TABLET | Freq: Every day | ORAL | Status: DC
  Administered 2017-05-09: 10 mg via ORAL
  Filled 2017-05-09: qty 1

## 2017-05-09 MED ORDER — ACETAMINOPHEN 325 MG PO TABS
650.0000 mg | ORAL_TABLET | Freq: Four times a day (QID) | ORAL | Status: DC | PRN
  Administered 2017-05-09: 19:00:00 650 mg via ORAL
  Filled 2017-05-09: qty 2

## 2017-05-09 MED ORDER — DOCUSATE SODIUM 100 MG PO CAPS: 100.0000 mg | ORAL_CAPSULE | Freq: Two times a day (BID) | ORAL | Status: DC | PRN

## 2017-05-09 MED ORDER — METHYLPHENIDATE HCL 5 MG PO TABS
5.0000 mg | ORAL_TABLET | Freq: Two times a day (BID) | ORAL | Status: DC
  Administered 2017-05-09 – 2017-05-10 (×2): 5 mg via ORAL
  Filled 2017-05-09 (×2): qty 1

## 2017-05-09 MED ORDER — SODIUM CHLORIDE 0.9 % IV BOLUS (SEPSIS)
1000.0000 mL | Freq: Once | INTRAVENOUS | Status: AC
Start: 2017-05-09 — End: 2017-05-09
  Administered 2017-05-09: 1000 mL via INTRAVENOUS

## 2017-05-09 MED ORDER — INSULIN ASPART 100 UNIT/ML ~~LOC~~ SOLN
0.0000 [IU] | Freq: Three times a day (TID) | SUBCUTANEOUS | Status: DC
  Administered 2017-05-10: 10:00:00 2 [IU] via SUBCUTANEOUS
  Filled 2017-05-09: qty 1

## 2017-05-09 MED ORDER — GABAPENTIN 300 MG PO CAPS
300.0000 mg | ORAL_CAPSULE | Freq: Every day | ORAL | Status: DC
  Administered 2017-05-09: 300 mg via ORAL
  Filled 2017-05-09: qty 1

## 2017-05-09 MED ORDER — ATORVASTATIN CALCIUM 20 MG PO TABS
20.0000 mg | ORAL_TABLET | Freq: Every day | ORAL | Status: DC
Start: 2017-05-10 — End: 2017-05-10
  Administered 2017-05-10: 20 mg via ORAL
  Filled 2017-05-09: qty 1

## 2017-05-09 MED ORDER — METFORMIN HCL 500 MG PO TABS
500.0000 mg | ORAL_TABLET | Freq: Three times a day (TID) | ORAL | Status: DC
  Administered 2017-05-09 – 2017-05-10 (×2): 500 mg via ORAL
  Filled 2017-05-09 (×3): qty 1

## 2017-05-09 MED ORDER — SODIUM CHLORIDE 0.9 % IV SOLN: 10.0000 mL/h | Freq: Once | INTRAVENOUS | Status: DC

## 2017-05-09 MED ORDER — GLIPIZIDE 5 MG PO TABS
5.0000 mg | ORAL_TABLET | Freq: Every day | ORAL | Status: DC
  Administered 2017-05-10: 5 mg via ORAL
  Filled 2017-05-09: qty 1

## 2017-05-09 NOTE — Telephone Encounter (Signed)
Randy Mcdaniel left v/m; pt was seen by Dr Carlean Purl and blood was down to 6; pt at [facility] today for transfusion. Dr Carlean Purl does not have credentials to order iron infusion at [facility] and pts wife wants to know if Dr Silvio Pate could order iron infusion at [facility]. pts wife request order put in first thing on 05/10/17. Randy Mcdaniel request cb.

## 2017-05-09 NOTE — ED Triage Notes (Signed)
Pt states he had blood work done at his pcp office yesterday, states he was sent to ED for hgb of 6.0, stats bright red rectal bleeding, denies any use of blood thinners, pt pale in color and states dizziness

## 2017-05-09 NOTE — Telephone Encounter (Signed)
Patient wife calling again to see if she can take pt to [facility] for transfusion. Would like a call back asap

## 2017-05-09 NOTE — Progress Notes (Signed)
[facility] [place] NAME: Randy Mcdaniel    MR#:  [id number]  DATE OF BIRTH:  [birth year]  DATE OF ADMISSION:  05/09/2017  PRIMARY CARE PHYSICIAN: Venia Carbon, MD   REQUESTING/REFERRING PHYSICIAN: Rifenbark  CHIEF COMPLAINT:   Chief Complaint  Patient presents with  . GI Bleeding    HISTORY OF PRESENT ILLNESS: Randy Mcdaniel  is a 76 y.o. male with a known history of anxiety, HLD, GI bleed- Recurrent hemorrhoids- had banding done yesterday at [place] by GI, sent blood work- which reported Hb 6- so advised to go to ER for blood transfusion. Pt have some weakness  , but no much symptoms. Bleed ing stopped now.  PAST MEDICAL HISTORY:   Past Medical History:  Diagnosis Date  . Anxiety   . Depression    recurrent and severe  . Diabetes mellitus    Type II with nephropathy  . GI bleed   . HLD (hyperlipidemia)   . Hypothyroidism   . Internal hemorrhoid   . Osteoarthritis   . Personal history of colonic adenoma 12/03/2012    PAST SURGICAL HISTORY: Past Surgical History:  Procedure Laterality Date  . COLONOSCOPY    . COLONOSCOPY WITH PROPOFOL N/A 12/20/2016   Procedure: COLONOSCOPY WITH PROPOFOL;  Surgeon: Jonathon Bellows, MD;  Location: [facility] ENDOSCOPY;  Service: Endoscopy;  Laterality: N/A;  . ESOPHAGOGASTRODUODENOSCOPY (EGD) WITH PROPOFOL N/A 12/17/2016   Procedure: ESOPHAGOGASTRODUODENOSCOPY (EGD) WITH PROPOFOL;  Surgeon: Wilford Corner, MD;  Location: [facility] ENDOSCOPY;  Service: Endoscopy;  Laterality: N/A;  . FLEXIBLE SIGMOIDOSCOPY N/A 12/17/2016   Procedure: FLEXIBLE SIGMOIDOSCOPY;  Surgeon: Wilford Corner, MD;  Location: [facility] ENDOSCOPY;  Service: Endoscopy;  Laterality: N/A;  . HEMORRHOID BANDING    . TOTAL KNEE ARTHROPLASTY  11/09   Right-Dr. Clair Gulling ([facility])  . TOTAL KNEE ARTHROPLASTY  6/10   Left-Dr. Clair Gulling ([facility])  . UMBILICAL HERNIA REPAIR  2000    SOCIAL HISTORY:  Social History  Substance Use Topics  . Smoking status:  Never Smoker  . Smokeless tobacco: Former Systems developer    Types: Chew  . Alcohol use No    FAMILY HISTORY:  Family History  Problem Relation Age of Onset  . Heart failure Father   . Diabetes Father   . Depression Father   . Coronary artery disease Father   . Breast cancer Sister   . Lung cancer Sister   . Depression Sister   . Stomach cancer Mother   . Stroke Unknown        Paternal side  . Parkinsonism Unknown        Cousin  . Colon cancer Neg Hx   . Rectal cancer Neg Hx   . Esophageal cancer Neg Hx   . Colon polyps Neg Hx     DRUG ALLERGIES:  Allergies  Allergen Reactions  . Vytorin [Ezetimibe-Simvastatin]     REACTION: nausea    REVIEW OF SYSTEMS:   CONSTITUTIONAL: No fever,positive for fatigue or weakness.  EYES: No blurred or double vision.  EARS, NOSE, AND THROAT: No tinnitus or ear pain.  RESPIRATORY: No cough, shortness of breath, wheezing or hemoptysis.  CARDIOVASCULAR: No chest pain, orthopnea, edema.  GASTROINTESTINAL: No nausea, vomiting, diarrhea or abdominal pain.  GENITOURINARY: No dysuria, hematuria.  ENDOCRINE: No polyuria, nocturia,  HEMATOLOGY: No anemia, easy bruising or bleeding SKIN: No rash or lesion. MUSCULOSKELETAL: No joint pain or arthritis.   NEUROLOGIC: No tingling, numbness, weakness.  PSYCHIATRY: No anxiety or depression.  MEDICATIONS AT HOME:  Prior to Admission medications   Medication Sig Start Date End Date Taking? Authorizing Provider  atorvastatin (LIPITOR) 20 MG tablet TAKE 1 TABLET BY MOUTH ONCE DAILY 11/15/16  Yes Viviana Simpler I, MD  escitalopram (LEXAPRO) 20 MG tablet Take 1 tablet (20 mg total) by mouth every morning. 01/04/17  Yes Venia Carbon, MD  gabapentin (NEURONTIN) 300 MG capsule TAKE 1 TO 2 CAPSULES BY MOUTH AT BEDTIME 01/05/17  Yes Viviana Simpler I, MD  glipiZIDE (GLUCOTROL) 5 MG tablet TAKE 1 TABLET BY MOUTH TWICE A DAY BEFORE A MEAL 02/13/17  Yes Venia Carbon, MD  levothyroxine (SYNTHROID, LEVOTHROID) 75  MCG tablet TAKE 1 TABLET BY MOUTH ONCE A DAY 08/30/16  Yes Viviana Simpler I, MD  lisinopril (PRINIVIL,ZESTRIL) 10 MG tablet TAKE 1 TABLET BY MOUTH ONCE DAILY 04/10/17  Yes Viviana Simpler I, MD  metFORMIN (GLUCOPHAGE) 500 MG tablet TAKE 1 TABLET BY MOUTH 3 TIMES DAILY 02/13/17  Yes Venia Carbon, MD  methylphenidate (RITALIN) 5 MG tablet Take 1 tablet (5 mg total) by mouth 2 (two) times daily. 03/27/17  Yes Venia Carbon, MD  OLANZapine (ZYPREXA) 10 MG tablet TAKE 1 TABLET BY MOUTH AT BEDTIME 12/21/16  Yes Venia Carbon, MD  vitamin B-12 (CYANOCOBALAMIN) 1000 MCG tablet Take 1,000 mcg by mouth daily.     Yes [provider]  glucose blood test strip Dx: 250.40 03/25/11   Venia Carbon, MD  hydrocortisone (ANUSOL-HC) 2.5 % rectal cream Place 1 application rectally at bedtime. For 7 days Patient not taking: Reported on 05/09/2017 12/26/16   Willia Craze, NP      PHYSICAL EXAMINATION:   VITAL SIGNS: Blood pressure (!) 124/58, pulse 79, temperature 98.2 F (36.8 C), temperature source Oral, resp. rate 20, height 5\' 10"  (1.778 m), weight 90.7 kg (200 lb), SpO2 96 %.  GENERAL:  76 y.o.-year-old patient lying in the bed with no acute distress.  EYES: Pupils equal, round, reactive to light and accommodation. No scleral icterus. Extraocular muscles intact. Conjunctiva pale. HEENT: Head atraumatic, normocephalic. Oropharynx and nasopharynx clear.  NECK:  Supple, no jugular venous distention. No thyroid enlargement, no tenderness.  LUNGS: Normal breath sounds bilaterally, no wheezing, rales,rhonchi or crepitation. No use of accessory muscles of respiration.  CARDIOVASCULAR: S1, S2 normal. No murmurs, rubs, or gallops.  ABDOMEN: Soft, nontender, nondistended. Bowel sounds present. No organomegaly or mass.  EXTREMITIES: No pedal edema, cyanosis, or clubbing.  NEUROLOGIC: Cranial nerves II through XII are intact. Muscle strength 5/5 in all extremities. Sensation intact. Gait not  checked.  PSYCHIATRIC: The patient is alert and oriented x 3.  SKIN: No obvious rash, lesion, or ulcer.   LABORATORY PANEL:   CBC  Recent Labs Lab 05/08/17 1518 05/09/17 1109  WBC 5.7 6.7  HGB 6.0 Repeated and verified X2.* 6.6*  HCT 18.8 Repeated and verified X2.* 19.7*  PLT 421.0* 432  MCV 90.8 89.9  MCH  --  30.0  MCHC 32.0 33.4  RDW 15.7* 16.0*  LYMPHSABS 1.1  --   MONOABS 0.4  --   EOSABS 0.1  --   BASOSABS 0.1  --    ------------------------------------------------------------------------------------------------------------------  Chemistries   Recent Labs Lab 05/09/17 1109  NA 134*  K 4.7  CL 104  CO2 22  GLUCOSE 206*  BUN 24*  CREATININE 1.28*  CALCIUM 9.1  AST 35  ALT 17  ALKPHOS 62  BILITOT 0.3   ------------------------------------------------------------------------------------------------------------------ estimated creatinine clearance is 54.8 mL/min (  A) (by C-G formula based on SCr of 1.28 mg/dL (H)). ------------------------------------------------------------------------------------------------------------------ No results for input(s): TSH, T4TOTAL, T3FREE, THYROIDAB in the last 72 hours.  Invalid input(s): FREET3   Coagulation profile  Recent Labs Lab 05/09/17 1109  INR 1.02   ------------------------------------------------------------------------------------------------------------------- No results for input(s): DDIMER in the last 72 hours. -------------------------------------------------------------------------------------------------------------------  Cardiac Enzymes No results for input(s): CKMB, TROPONINI, MYOGLOBIN in the last 168 hours.  Invalid input(s): CK ------------------------------------------------------------------------------------------------------------------ Invalid input(s):  POCBNP  ---------------------------------------------------------------------------------------------------------------  Urinalysis No results found for: COLORURINE, APPEARANCEUR, LABSPEC, PHURINE, GLUCOSEU, HGBUR, BILIRUBINUR, KETONESUR, PROTEINUR, UROBILINOGEN, NITRITE, LEUKOCYTESUR   RADIOLOGY: No results found.  EKG: Orders placed or performed during the hospital encounter of 05/09/17  . ED EKG  . ED EKG    IMPRESSION AND PLAN:  * Symptomatic anemia due to blood loss.   Hemorrhoidal bleed   2 units PRBC ordered by ER.   Banding done by GI yesterday.   GI suggesting IV iron weekly from next week.  * Hypertension    As BP is low, hold BP meds    * DM   Cont home meds, keep On sliding scale coverage.  * hyperlipidemia   Cont statin.   All the records are reviewed and case discussed with ED provider. Management plans discussed with the patient, family and they are in agreement.  CODE STATUS: full. Code Status History    Date Active Date Inactive Code Status Order ID Comments User Context   12/16/2016  4:32 PM 12/20/2016  5:36 PM Full Code [id number]  Theodoro Grist, MD Inpatient    Advance Directive Documentation     Most Recent Value  Type of Advance Directive  Healthcare Power of [place], Living will  Pre-existing out of facility DNR order (yellow form or pink MOST form)  -  "MOST" Form in Place?  -       TOTAL TIME TAKING CARE OF THIS PATIENT: 40 minutes.    Vaughan Basta M.D on 05/09/2017   Between 7am to 6pm - Pager - [phone]  After 6pm go to [email or web address] - password [email or web address] [facility]  Office  [phone]  CC: Primary care physician; Venia Carbon, MD   Note: This dictation was prepared with Dragon dictation along with smaller phrase technology. Any transcriptional errors that result from this process are unintentional.

## 2017-05-09 NOTE — ED Notes (Signed)
Pt's wife signed consent for blood per pt's request.

## 2017-05-09 NOTE — Telephone Encounter (Signed)
See results notes on CBC from today for details.

## 2017-05-09 NOTE — ED Notes (Signed)
15 minute vitals entered.

## 2017-05-09 NOTE — Progress Notes (Signed)
Pt requesting Tylenol for neuropathy feet pain. On call MD paged. See new orders.

## 2017-05-09 NOTE — H&P (Signed)
[facility] [place] NAME: Randy Mcdaniel    MR#:  [id number]  DATE OF BIRTH:  [birth year]  DATE OF ADMISSION:  05/09/2017  PRIMARY CARE PHYSICIAN: Venia Carbon, MD   REQUESTING/REFERRING PHYSICIAN: Rifenbark  CHIEF COMPLAINT:   Chief Complaint  Patient presents with  . GI Bleeding    HISTORY OF PRESENT ILLNESS: Randy Mcdaniel  is a 76 y.o. male with a known history of anxiety, HLD, GI bleed- Recurrent hemorrhoids- had banding done yesterday at [facility] by GI, sent blood work- which reported Hb 6- so advised to go to ER for blood transfusion. Pt have some weakness  , but no much symptoms. Bleed ing stopped now.  PAST MEDICAL HISTORY:   Past Medical History:  Diagnosis Date  . Anxiety   . Depression    recurrent and severe  . Diabetes mellitus    Type II with nephropathy  . GI bleed   . HLD (hyperlipidemia)   . Hypothyroidism   . Internal hemorrhoid   . Osteoarthritis   . Personal history of colonic adenoma 12/03/2012    PAST SURGICAL HISTORY: Past Surgical History:  Procedure Laterality Date  . COLONOSCOPY    . COLONOSCOPY WITH PROPOFOL N/A 12/20/2016   Procedure: COLONOSCOPY WITH PROPOFOL;  Surgeon: Jonathon Bellows, MD;  Location: [facility] ENDOSCOPY;  Service: Endoscopy;  Laterality: N/A;  . ESOPHAGOGASTRODUODENOSCOPY (EGD) WITH PROPOFOL N/A 12/17/2016   Procedure: ESOPHAGOGASTRODUODENOSCOPY (EGD) WITH PROPOFOL;  Surgeon: Wilford Corner, MD;  Location: [facility] ENDOSCOPY;  Service: Endoscopy;  Laterality: N/A;  . FLEXIBLE SIGMOIDOSCOPY N/A 12/17/2016   Procedure: FLEXIBLE SIGMOIDOSCOPY;  Surgeon: Wilford Corner, MD;  Location[facility] ENDOSCOPY;  Service: Endoscopy;  Laterality: N/A;  . HEMORRHOID BANDING    . TOTAL KNEE ARTHROPLASTY  11/09   Right-Dr. Clair Gulling ([facility])  . TOTAL KNEE ARTHROPLASTY  6/10   Left-Dr. Clair Gulling ([facility])  . UMBILICAL HERNIA REPAIR  2000    SOCIAL HISTORY:  Social History  Substance Use Topics  . Smoking status:  Never Smoker  . Smokeless tobacco: Former Systems developer    Types: Chew  . Alcohol use No    FAMILY HISTORY:  Family History  Problem Relation Age of Onset  . Heart failure Father   . Diabetes Father   . Depression Father   . Coronary artery disease Father   . Breast cancer Sister   . Lung cancer Sister   . Depression Sister   . Stomach cancer Mother   . Stroke Unknown        Paternal side  . Parkinsonism Unknown        Cousin  . Colon cancer Neg Hx   . Rectal cancer Neg Hx   . Esophageal cancer Neg Hx   . Colon polyps Neg Hx     DRUG ALLERGIES:  Allergies  Allergen Reactions  . Vytorin [Ezetimibe-Simvastatin]     REACTION: nausea    REVIEW OF SYSTEMS:   CONSTITUTIONAL: No fever,positive for fatigue or weakness.  EYES: No blurred or double vision.  EARS, NOSE, AND THROAT: No tinnitus or ear pain.  RESPIRATORY: No cough, shortness of breath, wheezing or hemoptysis.  CARDIOVASCULAR: No chest pain, orthopnea, edema.  GASTROINTESTINAL: No nausea, vomiting, diarrhea or abdominal pain.  GENITOURINARY: No dysuria, hematuria.  ENDOCRINE: No polyuria, nocturia,  HEMATOLOGY: No anemia, easy bruising or bleeding SKIN: No rash or lesion. MUSCULOSKELETAL: No joint pain or arthritis.   NEUROLOGIC: No tingling, numbness, weakness.  PSYCHIATRY: No anxiety or depression.  MEDICATIONS AT HOME:  Prior to Admission medications   Medication Sig Start Date End Date Taking? Authorizing Provider  atorvastatin (LIPITOR) 20 MG tablet TAKE 1 TABLET BY MOUTH ONCE DAILY 11/15/16  Yes Viviana Simpler I, MD  escitalopram (LEXAPRO) 20 MG tablet Take 1 tablet (20 mg total) by mouth every morning. 01/04/17  Yes Venia Carbon, MD  gabapentin (NEURONTIN) 300 MG capsule TAKE 1 TO 2 CAPSULES BY MOUTH AT BEDTIME 01/05/17  Yes Viviana Simpler I, MD  glipiZIDE (GLUCOTROL) 5 MG tablet TAKE 1 TABLET BY MOUTH TWICE A DAY BEFORE A MEAL 02/13/17  Yes Venia Carbon, MD  levothyroxine (SYNTHROID, LEVOTHROID) 75  MCG tablet TAKE 1 TABLET BY MOUTH ONCE A DAY 08/30/16  Yes Viviana Simpler I, MD  lisinopril (PRINIVIL,ZESTRIL) 10 MG tablet TAKE 1 TABLET BY MOUTH ONCE DAILY 04/10/17  Yes Viviana Simpler I, MD  metFORMIN (GLUCOPHAGE) 500 MG tablet TAKE 1 TABLET BY MOUTH 3 TIMES DAILY 02/13/17  Yes Venia Carbon, MD  methylphenidate (RITALIN) 5 MG tablet Take 1 tablet (5 mg total) by mouth 2 (two) times daily. 03/27/17  Yes Venia Carbon, MD  OLANZapine (ZYPREXA) 10 MG tablet TAKE 1 TABLET BY MOUTH AT BEDTIME 12/21/16  Yes Venia Carbon, MD  vitamin B-12 (CYANOCOBALAMIN) 1000 MCG tablet Take 1,000 mcg by mouth daily.     Yes [provider]  glucose blood test strip Dx: 250.40 03/25/11   Venia Carbon, MD  hydrocortisone (ANUSOL-HC) 2.5 % rectal cream Place 1 application rectally at bedtime. For 7 days Patient not taking: Reported on 05/09/2017 12/26/16   Willia Craze, NP      PHYSICAL EXAMINATION:   VITAL SIGNS: Blood pressure (!) 124/58, pulse 79, temperature 98.2 F (36.8 C), temperature source Oral, resp. rate 20, height 5\' 10"  (1.778 m), weight 90.7 kg (200 lb), SpO2 96 %.  GENERAL:  76 y.o.-year-old patient lying in the bed with no acute distress.  EYES: Pupils equal, round, reactive to light and accommodation. No scleral icterus. Extraocular muscles intact. Conjunctiva pale. HEENT: Head atraumatic, normocephalic. Oropharynx and nasopharynx clear.  NECK:  Supple, no jugular venous distention. No thyroid enlargement, no tenderness.  LUNGS: Normal breath sounds bilaterally, no wheezing, rales,rhonchi or crepitation. No use of accessory muscles of respiration.  CARDIOVASCULAR: S1, S2 normal. No murmurs, rubs, or gallops.  ABDOMEN: Soft, nontender, nondistended. Bowel sounds present. No organomegaly or mass.  EXTREMITIES: No pedal edema, cyanosis, or clubbing.  NEUROLOGIC: Cranial nerves II through XII are intact. Muscle strength 5/5 in all extremities. Sensation intact. Gait not  checked.  PSYCHIATRIC: The patient is alert and oriented x 3.  SKIN: No obvious rash, lesion, or ulcer.   LABORATORY PANEL:   CBC  Recent Labs Lab 05/08/17 1518 05/09/17 1109  WBC 5.7 6.7  HGB 6.0 Repeated and verified X2.* 6.6*  HCT 18.8 Repeated and verified X2.* 19.7*  PLT 421.0* 432  MCV 90.8 89.9  MCH  --  30.0  MCHC 32.0 33.4  RDW 15.7* 16.0*  LYMPHSABS 1.1  --   MONOABS 0.4  --   EOSABS 0.1  --   BASOSABS 0.1  --    ------------------------------------------------------------------------------------------------------------------  Chemistries   Recent Labs Lab 05/09/17 1109  NA 134*  K 4.7  CL 104  CO2 22  GLUCOSE 206*  BUN 24*  CREATININE 1.28*  CALCIUM 9.1  AST 35  ALT 17  ALKPHOS 62  BILITOT 0.3   ------------------------------------------------------------------------------------------------------------------ estimated creatinine clearance is 54.8 mL/min (  A) (by C-G formula based on SCr of 1.28 mg/dL (H)). ------------------------------------------------------------------------------------------------------------------ No results for input(s): TSH, T4TOTAL, T3FREE, THYROIDAB in the last 72 hours.  Invalid input(s): FREET3   Coagulation profile  Recent Labs Lab 05/09/17 1109  INR 1.02   ------------------------------------------------------------------------------------------------------------------- No results for input(s): DDIMER in the last 72 hours. -------------------------------------------------------------------------------------------------------------------  Cardiac Enzymes No results for input(s): CKMB, TROPONINI, MYOGLOBIN in the last 168 hours.  Invalid input(s): CK ------------------------------------------------------------------------------------------------------------------ Invalid input(s):  POCBNP  ---------------------------------------------------------------------------------------------------------------  Urinalysis No results found for: COLORURINE, APPEARANCEUR, LABSPEC, PHURINE, GLUCOSEU, HGBUR, BILIRUBINUR, KETONESUR, PROTEINUR, UROBILINOGEN, NITRITE, LEUKOCYTESUR   RADIOLOGY: No results found.  EKG: Orders placed or performed during the hospital encounter of 05/09/17  . ED EKG  . ED EKG    IMPRESSION AND PLAN:  * Symptomatic anemia due to blood loss.   Hemorrhoidal bleed   2 units PRBC ordered by ER.   Banding done by GI yesterday.   GI suggesting IV iron weekly from next week.  * Hypertension    As BP is low, hold BP meds    * DM   Cont home meds, keep On sliding scale coverage.  * hyperlipidemia   Cont statin.   All the records are reviewed and case discussed with ED provider. Management plans discussed with the patient, family and they are in agreement.  CODE STATUS: full. Code Status History    Date Active Date Inactive Code Status Order ID Comments User Context   12/16/2016  4:32 PM 12/20/2016  5:36 PM Full Code [id number]  Theodoro Grist, MD Inpatient    Advance Directive Documentation     Most Recent Value  Type of Advance Directive  Healthcare Power of [place], Living will  Pre-existing out of facility DNR order (yellow form or pink MOST form)  -  "MOST" Form in Place?  -       TOTAL TIME TAKING CARE OF THIS PATIENT: 40 minutes.    Vaughan Basta M.D on 05/09/2017   Between 7am to 6pm - Pager - [phone]  After 6pm go to [email or web address] - password [email or web address] [place] [facility]  Office  [phone]  CC: Primary care physician; Venia Carbon, MD   Note: This dictation was prepared with Dragon dictation along with smaller phrase technology. Any transcriptional errors that result from this process are unintentional.

## 2017-05-09 NOTE — ED Provider Notes (Signed)
[facility] Emergency Department Provider Note    First MD Initiated Contact with Patient 05/09/17 1120     (approximate)  I have reviewed the triage vital signs and the nursing notes.   HISTORY  Chief Complaint GI Bleeding    HPI Randy Mcdaniel. is a 76 y.o. male With a history of internal hemorrhoids as well as diverticulosis presents with fatigue shortness of breath and weakness that is been worsening over the past 2 weeks. Patient also had multiple episodes of hematochezia. Was seen by GI doctor yesterday in [place] and had outpatient banding of internal hemorrhoids. States he feels that the bleeding has stopped but is still feeling weak. Blood work done yesterday showed that he had a hemoglobin of 6. Presents to the ER due to persistent weakness and symptomatic anemia. No nausea or vomiting. No melena. No hematemesis. Is not on any blood thinners.   Past Medical History:  Diagnosis Date  . Anxiety   . Depression    recurrent and severe  . Diabetes mellitus    Type II with nephropathy  . GI bleed   . HLD (hyperlipidemia)   . Hypothyroidism   . Internal hemorrhoid   . Osteoarthritis   . Personal history of colonic adenoma 12/03/2012   Family History  Problem Relation Age of Onset  . Heart failure Father   . Diabetes Father   . Depression Father   . Coronary artery disease Father   . Breast cancer Sister   . Lung cancer Sister   . Depression Sister   . Stomach cancer Mother   . Stroke Unknown        Paternal side  . Parkinsonism Unknown        Cousin  . Colon cancer Neg Hx   . Rectal cancer Neg Hx   . Esophageal cancer Neg Hx   . Colon polyps Neg Hx    Past Surgical History:  Procedure Laterality Date  . COLONOSCOPY    . COLONOSCOPY WITH PROPOFOL N/A 12/20/2016   Procedure: COLONOSCOPY WITH PROPOFOL;  Surgeon: Jonathon Bellows, MD;  Location[facility] ENDOSCOPY;  Service: Endoscopy;  Laterality: N/A;  . ESOPHAGOGASTRODUODENOSCOPY  (EGD) WITH PROPOFOL N/A 12/17/2016   Procedure: ESOPHAGOGASTRODUODENOSCOPY (EGD) WITH PROPOFOL;  Surgeon: Wilford Corner, MD;  Location: [facility] ENDOSCOPY;  Service: Endoscopy;  Laterality: N/A;  . FLEXIBLE SIGMOIDOSCOPY N/A 12/17/2016   Procedure: FLEXIBLE SIGMOIDOSCOPY;  Surgeon: Wilford Corner, MD;  Location: [facility] ENDOSCOPY;  Service: Endoscopy;  Laterality: N/A;  . HEMORRHOID BANDING    . TOTAL KNEE ARTHROPLASTY  11/09   Right-Dr. Clair Gulling ([facility])  . TOTAL KNEE ARTHROPLASTY  6/10   Left-Dr. Clair Gulling ([facility])  . UMBILICAL HERNIA REPAIR  2000   Patient Active Problem List   Diagnosis Date Noted  . Anemia due to blood loss 05/09/2017  . Acute posthemorrhagic anemia 12/16/2016  . Near syncope 12/16/2016  . Hypotension 12/16/2016  . Prolapsed internal hemorrhoids, grade 3 10/20/2015  . Advance directive discussed with patient 05/22/2015  . Spinal stenosis, lumbar region, with neurogenic claudication 09/17/2014  . Diabetes, polyneuropathy ([place]) 11/15/2013  . Personal history of colonic adenoma 12/03/2012  . Routine general medical examination at a health care facility 10/16/2012  . Constipation 01/10/2011  . B12 DEFICIENCY 08/18/2010  . Type 2 diabetes mellitus with neurological manifestations, controlled ([place]) 02/17/2007  . OSTEOARTHRITIS 02/17/2007  . Hypothyroidism 02/15/2007  . Hyperlipemia 02/15/2007  . Panic anxiety syndrome 02/15/2007  . Depression, major, recurrent, in partial remission ([place])  02/15/2007      Prior to Admission medications   Medication Sig Start Date End Date Taking? Authorizing Provider  atorvastatin (LIPITOR) 20 MG tablet TAKE 1 TABLET BY MOUTH ONCE DAILY 11/15/16  Yes Viviana Simpler I, MD  escitalopram (LEXAPRO) 20 MG tablet Take 1 tablet (20 mg total) by mouth every morning. 01/04/17  Yes Venia Carbon, MD  gabapentin (NEURONTIN) 300 MG capsule TAKE 1 TO 2 CAPSULES BY MOUTH AT BEDTIME 01/05/17  Yes Viviana Simpler I, MD  glipiZIDE (GLUCOTROL) 5 MG tablet  TAKE 1 TABLET BY MOUTH TWICE A DAY BEFORE A MEAL 02/13/17  Yes Venia Carbon, MD  levothyroxine (SYNTHROID, LEVOTHROID) 75 MCG tablet TAKE 1 TABLET BY MOUTH ONCE A DAY 08/30/16  Yes Viviana Simpler I, MD  lisinopril (PRINIVIL,ZESTRIL) 10 MG tablet TAKE 1 TABLET BY MOUTH ONCE DAILY 04/10/17  Yes Viviana Simpler I, MD  metFORMIN (GLUCOPHAGE) 500 MG tablet TAKE 1 TABLET BY MOUTH 3 TIMES DAILY 02/13/17  Yes Venia Carbon, MD  methylphenidate (RITALIN) 5 MG tablet Take 1 tablet (5 mg total) by mouth 2 (two) times daily. 03/27/17  Yes Venia Carbon, MD  OLANZapine (ZYPREXA) 10 MG tablet TAKE 1 TABLET BY MOUTH AT BEDTIME 12/21/16  Yes Venia Carbon, MD  vitamin B-12 (CYANOCOBALAMIN) 1000 MCG tablet Take 1,000 mcg by mouth daily.     Yes [provider]  glucose blood test strip Dx: 250.40 03/25/11   Venia Carbon, MD  hydrocortisone (ANUSOL-HC) 2.5 % rectal cream Place 1 application rectally at bedtime. For 7 days Patient not taking: Reported on 05/09/2017 12/26/16   Willia Craze, NP    Allergies Vytorin [ezetimibe-simvastatin]    Social History Social History  Substance Use Topics  . Smoking status: Never Smoker  . Smokeless tobacco: Former Systems developer    Types: Chew  . Alcohol use No    Review of Systems Patient denies headaches, rhinorrhea, blurry vision, numbness, shortness of breath, chest pain, edema, cough, abdominal pain, nausea, vomiting, diarrhea, dysuria, fevers, rashes or hallucinations unless otherwise stated above in HPI. ____________________________________________   PHYSICAL EXAM:  VITAL SIGNS: Vitals:   05/09/17 1238 05/09/17 1258  BP: 114/61 (!) 124/58  Pulse: 74 79  Resp:  20  Temp: 98.1 F (36.7 C) 98.2 F (36.8 C)  SpO2: 96% 96%    Constitutional: Alert and oriented. Pale appearing  Eyes: Conjunctivae are pale Head: Atraumatic. Nose: No congestion/rhinnorhea. Mouth/Throat: Mucous membranes are moist.   Neck: No stridor. Painless ROM.    Cardiovascular: Normal rate, regular rhythm. Grossly normal heart sounds.  Good peripheral circulation. Respiratory: mild tachypnea.  No retractions. Lungs CTAB. Gastrointestinal: Soft and nontender. No distention. No abdominal bruits. No CVA tenderness. Genitourinary: small amount of blood, no active bleeding observed Musculoskeletal: No lower extremity tenderness nor edema.  No joint effusions. Neurologic:  Normal speech and language. No gross focal neurologic deficits are appreciated. No facial droop Skin:  Skin is pale, dry and intact. No rash noted. Psychiatric: Mood and affect are normal. Speech and behavior are normal.  ____________________________________________   LABS (all labs ordered are listed, but only abnormal results are displayed)  Results for orders placed or performed during the hospital encounter of 05/09/17 (from the past 24 hour(s))  Comprehensive metabolic panel     Status: Abnormal   Collection Time: 05/09/17 11:09 AM  Result Value Ref Range   Sodium 134 (L) 135 - 145 mmol/L   Potassium 4.7 3.5 - 5.1 mmol/L  Chloride 104 101 - 111 mmol/L   CO2 22 22 - 32 mmol/L   Glucose, Bld 206 (H) 65 - 99 mg/dL   BUN 24 (H) 6 - 20 mg/dL   Creatinine, Ser 1.28 (H) 0.61 - 1.24 mg/dL   Calcium 9.1 8.9 - 10.3 mg/dL   Total Protein 6.6 6.5 - 8.1 g/dL   Albumin 3.7 3.5 - 5.0 g/dL   AST 35 15 - 41 U/L   ALT 17 17 - 63 U/L   Alkaline Phosphatase 62 38 - 126 U/L   Total Bilirubin 0.3 0.3 - 1.2 mg/dL   GFR calc non Af Amer 52 (L) >60 mL/min   GFR calc Af Amer >60 >60 mL/min   Anion gap 8 5 - 15  CBC     Status: Abnormal   Collection Time: 05/09/17 11:09 AM  Result Value Ref Range   WBC 6.7 3.8 - 10.6 K/uL   RBC 2.19 (L) 4.40 - 5.90 MIL/uL   Hemoglobin 6.6 (L) 13.0 - 18.0 g/dL   HCT 19.7 (L) 40.0 - 52.0 %   MCV 89.9 80.0 - 100.0 fL   MCH 30.0 26.0 - 34.0 pg   MCHC 33.4 32.0 - 36.0 g/dL   RDW 16.0 (H) 11.5 - 14.5 %   Platelets 432 150 - 440 K/uL  Type and screen  [facility]     Status: None (Preliminary result)   Collection Time: 05/09/17 11:09 AM  Result Value Ref Range   ABO/RH(D) A POS    Antibody Screen NEG    Sample Expiration 05/12/2017    Unit Number [id number]    Blood Component Type RBC LR PHER2    Unit division 00    Status of Unit ISSUED    Transfusion Status OK TO TRANSFUSE    Crossmatch Result Compatible    Unit Number [id number]    Blood Component Type RBC LR PHER2    Unit division 00    Status of Unit ISSUED    Transfusion Status OK TO TRANSFUSE    Crossmatch Result Compatible   Protime-INR     Status: None   Collection Time: 05/09/17 11:09 AM  Result Value Ref Range   Prothrombin Time 13.3 11.4 - 15.2 seconds   INR 1.02   Prepare RBC     Status: None   Collection Time: 05/09/17 12:00 PM  Result Value Ref Range   Order Confirmation ORDER PROCESSED BY BLOOD BANK    ____________________________________________  EKG My review and personal interpretation at Time: 12:04   Indication: weakness  Rate: 75  Rhythm: sinus Axis: left Other: normal intervals, non specific st and t wave changes, poor rwave progression ____________________________________________  RADIOLOGY   ____________________________________________   PROCEDURES  Procedure(s) performed:  Procedures    Critical Care performed: CRITICAL CARE Performed by: Merlyn Lot   Total critical care time: 40 minutes  Critical care time was exclusive of separately billable procedures and treating other patients.  Critical care was necessary to treat or prevent imminent or life-threatening deterioration.  Critical care was time spent personally by me on the following activities: development of treatment plan with patient and/or surrogate as well as nursing, discussions with consultants, evaluation of patient's response to treatment, examination of patient, obtaining history from patient or surrogate, ordering and performing  treatments and interventions, ordering and review of laboratory studies, ordering and review of radiographic studies, pulse oximetry and re-evaluation of patient's condition.  ____________________________________________   INITIAL IMPRESSION / ASSESSMENT AND PLAN /  ED COURSE  Pertinent labs & imaging results that were available during my care of the patient were reviewed by me and considered in my medical decision making (see chart for details).  DDX: acute blood loss anemia, gi bleed, ida, diverticular bleed  Piotr Christopher. is a 76 y.o. who presents to the ED with symptomatically anemia likely secondary to ongoing blood loss from internal hemorrhoids. Patient frail and symptomatically therefore will give 2 units of crossmatched packed red cells at this time. Patient otherwise hemodynamically stable and does not have any evidence of ongoing bleeding at this time.  do feel patient will require admission to the hospital for transfusion and serial hemoglobins. Patient may also benefit from surgical consultation due to extent of hemroid bleeding.      ____________________________________________   FINAL CLINICAL IMPRESSION(S) / ED DIAGNOSES  Final diagnoses:  Acute blood loss anemia  Internal hemorrhoids  Other specified hypotension      NEW MEDICATIONS STARTED DURING THIS VISIT:  New Prescriptions   No medications on file     Note:  This document was prepared using Dragon voice recognition software and may include unintentional dictation errors.    Merlyn Lot, MD 05/09/17 1400

## 2017-05-10 LAB — BPAM RBC
BLOOD PRODUCT EXPIRATION DATE: 201810042359
BLOOD PRODUCT EXPIRATION DATE: 201810042359
ISSUE DATE / TIME: 201809111228
ISSUE DATE / TIME: 201809111228
UNIT TYPE AND RH: 5100
Unit Type and Rh: 5100

## 2017-05-10 LAB — BASIC METABOLIC PANEL
ANION GAP: 4 — AB (ref 5–15)
BUN: 20 mg/dL (ref 6–20)
CHLORIDE: 110 mmol/L (ref 101–111)
CO2: 25 mmol/L (ref 22–32)
Calcium: 8.8 mg/dL — ABNORMAL LOW (ref 8.9–10.3)
Creatinine, Ser: 1.15 mg/dL (ref 0.61–1.24)
GFR calc non Af Amer: 60 mL/min — ABNORMAL LOW (ref >60)
Glucose, Bld: 103 mg/dL — ABNORMAL HIGH (ref 65–99)
Potassium: 4.8 mmol/L (ref 3.5–5.1)
Sodium: 139 mmol/L (ref 135–145)

## 2017-05-10 LAB — TYPE AND SCREEN
ABO/RH(D): A POS
ANTIBODY SCREEN: NEGATIVE
Unit division: 0
Unit division: 0

## 2017-05-10 LAB — CBC
HCT: 23.3 % — ABNORMAL LOW (ref 40.0–52.0)
HEMOGLOBIN: 7.9 g/dL — AB (ref 13.0–18.0)
MCH: 29.4 pg (ref 26.0–34.0)
MCHC: 33.8 g/dL (ref 32.0–36.0)
MCV: 86.8 fL (ref 80.0–100.0)
Platelets: 348 10*3/uL (ref 150–440)
RBC: 2.68 MIL/uL — AB (ref 4.40–5.90)
RDW: 17.3 % — ABNORMAL HIGH (ref 11.5–14.5)
WBC: 6.4 10*3/uL (ref 3.8–10.6)

## 2017-05-10 LAB — GLUCOSE, CAPILLARY
GLUCOSE-CAPILLARY: 133 mg/dL — AB (ref 65–99)
Glucose-Capillary: 167 mg/dL — ABNORMAL HIGH (ref 65–99)

## 2017-05-10 MED ORDER — SODIUM CHLORIDE 0.9 % IV SOLN
510.0000 mg | Freq: Once | INTRAVENOUS | Status: AC
  Administered 2017-05-10: 09:00:00 510 mg via INTRAVENOUS
  Filled 2017-05-10: qty 17

## 2017-05-10 NOTE — Telephone Encounter (Signed)
Please let them know that Dr Carlean Purl let me know about this---sorry to hear of the trouble. I have put in a consult for the hematologists at [facility] can arrange the iron infusion (I can't do that)

## 2017-05-10 NOTE — Telephone Encounter (Signed)
Spoke to pt's wife. She said he had his infusion today.

## 2017-05-10 NOTE — Telephone Encounter (Signed)
Wife returned call - she said to return call on same number please

## 2017-05-10 NOTE — Telephone Encounter (Signed)
Left message to call office

## 2017-05-10 NOTE — Discharge Instructions (Signed)
Resume diet and activity as before ° ° °

## 2017-05-10 NOTE — Care Management Note (Signed)
Case Management Note  Patient Details  Name: Randy Mcdaniel. MRN: [id number] Date of Birth: 09-May-1941  Subjective/Objective:   Admitted under observation status with the diagnosis of acute post hemorrhage. Lives with wife, Tessie Fass [phone]). Pres[facility]. Cane, rolling walker, and wheelchair in the home [place] 12/14/16. [place] in the past. No home oxygen. Takes care of all basic activities of daily living himself, doesn't drive. Hard of hearing. Falls x 2 a long time ago. Good appetite. Wife will drive.  Hgb 7.9 Receiving a unit of blood                 Action/Plan: No follow-up needs identified at this time.   Expected Discharge Date:                  Expected Discharge Plan:     In-House Referral:     Discharge planning Services     Post Acute Care Choice:    Choice offered to:     DME Arranged:    DME Agency:     HH Arranged:    HH Agency:     Status of Service:     If discussed at [employer], dates discussed:    Additional Comments:  Shelbie Ammons, [place] [facility] [phone] 05/10/2017, 8:41 AM

## 2017-05-10 NOTE — Progress Notes (Signed)
Pt is being discharged today, discharge instructions were reviewed with the patient and his wife, both verified understanding. 0 prescriptions were given to the patient. IV x1 removed. All belongings packed and returned to the patient, he was rolled out in a wheelchair by staff.

## 2017-05-10 NOTE — Care Management Obs Status (Signed)
[place] NOTIFICATION   Patient Details  Name: Randy Mcdaniel. MRN: [id number] Date of Birth: [birth year]   Medicare Observation Status Notification Given:  Yes    Shelbie Ammons, RN 05/10/2017, 8:41 AM

## 2017-05-11 ENCOUNTER — Telehealth: Payer: Self-pay

## 2017-05-11 NOTE — Telephone Encounter (Signed)
LM requesting call back to complete TCM and confirm hospital f/u appt.

## 2017-05-12 NOTE — Telephone Encounter (Signed)
Transition Care Management Follow-up Telephone Call   Date discharged? 05/10/17   How have you been since you were released from the hospital? "I'm pretty good"   Do you understand why you were in the hospital? yes   Do you understand the discharge instructions? yes   Where were you discharged to? Home, lives with wife.   Items Reviewed:  Medications reviewed: no  Allergies reviewed: yes  Dietary changes reviewed: no changes.   Referrals reviewed: F/U with GI   Functional Questionnaire:   Activities of Daily Living (ADLs):   He states they are independent in the following: ambulation, bathing and hygiene, feeding, continence, grooming, toileting and dressing States they require assistance with the following: None   Any transportation issues/concerns?: no   Any patient concerns? no   Confirmed importance and date/time of follow-up visits scheduled yes  Provider Appointment booked with PCP 05/17/17 @ 1015  Confirmed with patient if condition begins to worsen call PCP or go to the ER.  Patient was given the office number and encouraged to call back with question or concerns.  : yes

## 2017-05-12 NOTE — Telephone Encounter (Signed)
Please make sure he is set up with the hematologist

## 2017-05-13 NOTE — Discharge Summary (Signed)
[place] [facility] [place] NAME: Randy Mcdaniel    MR#:  [id number]  DATE OF BIRTH:  [birth year]  DATE OF ADMISSION:  05/09/2017 ADMITTING PHYSICIAN: Vaughan Basta, MD  DATE OF DISCHARGE: 05/10/2017 10:42 AM  PRIMARY CARE PHYSICIAN: Venia Carbon, MD   ADMISSION DIAGNOSIS:  Other specified hypotension [I95.89] Internal hemorrhoids [K64.8] Acute blood loss anemia [D62]  DISCHARGE DIAGNOSIS:  Principal Problem:   Acute posthemorrhagic anemia Active Problems:   Anemia due to blood loss   SECONDARY DIAGNOSIS:   Past Medical History:  Diagnosis Date  . Anxiety   . Depression    recurrent and severe  . Diabetes mellitus    Type II with nephropathy  . GI bleed   . HLD (hyperlipidemia)   . Hypothyroidism   . Internal hemorrhoid   . Osteoarthritis   . Personal history of colonic adenoma 12/03/2012     ADMITTING HISTORY  HISTORY OF PRESENT ILLNESS: Randy Mcdaniel  is a 76 y.o. male with a known history of anxiety, HLD, GI bleed- Recurrent hemorrhoids- had banding done yesterday at [facility] by GI, sent blood work- which reported Hb 6- so advised to go to ER for blood transfusion. Pt have some weakness  , but no much symptoms. Bleed ing stopped now.  HOSPITAL COURSE:   * symptomatic anemia due to chronic GI blood loss from hemorrhoidal bleeding. Patient was admitted for 2 unit packed RBC transfusion. Hemoglobin repeated and improved. Symptoms are improved. He also received IV iron infusion as suggested by his GI physician. Patient is being discharged home to follow-up with his primary care physician and GI in 1 week  stable  CONSULTS OBTAINED:    DRUG ALLERGIES:   Allergies  Allergen Reactions  . Vytorin [Ezetimibe-Simvastatin]     REACTION: nausea    DISCHARGE MEDICATIONS:   Discharge Medication List as of 05/10/2017 10:31 AM    CONTINUE these medications which have NOT CHANGED   Details  atorvastatin (LIPITOR) 20 MG  tablet TAKE 1 TABLET BY MOUTH ONCE DAILY, Normal    escitalopram (LEXAPRO) 20 MG tablet Take 1 tablet (20 mg total) by mouth every morning., Starting Wed 01/04/2017, No Print    gabapentin (NEURONTIN) 300 MG capsule TAKE 1 TO 2 CAPSULES BY MOUTH AT BEDTIME, Normal    glipiZIDE (GLUCOTROL) 5 MG tablet TAKE 1 TABLET BY MOUTH TWICE A DAY BEFORE A MEAL, Normal    levothyroxine (SYNTHROID, LEVOTHROID) 75 MCG tablet TAKE 1 TABLET BY MOUTH ONCE A DAY, Normal    lisinopril (PRINIVIL,ZESTRIL) 10 MG tablet TAKE 1 TABLET BY MOUTH ONCE DAILY, Normal    metFORMIN (GLUCOPHAGE) 500 MG tablet TAKE 1 TABLET BY MOUTH 3 TIMES DAILY, Normal    methylphenidate (RITALIN) 5 MG tablet Take 1 tablet (5 mg total) by mouth 2 (two) times daily., Starting Mon 03/27/2017, Print    OLANZapine (ZYPREXA) 10 MG tablet TAKE 1 TABLET BY MOUTH AT BEDTIME, Normal    vitamin B-12 (CYANOCOBALAMIN) 1000 MCG tablet Take 1,000 mcg by mouth daily.  , Historical Med    glucose blood test strip Dx: 250.40, Normal    hydrocortisone (ANUSOL-HC) 2.5 % rectal cream Place 1 application rectally at bedtime. For 7 days, Starting Mon 12/26/2016, Normal        Today   VITAL SIGNS:  Blood pressure (!) 133/55, pulse 60, temperature 98.1 F (36.7 C), temperature source Oral, resp. rate 18, height 5\' 10"  (1.778 m), weight 90.7 kg (200 lb), SpO2 97 %.  I/O:  No intake or output data in the 24 hours ending 05/13/17 1148  PHYSICAL EXAMINATION:  Physical Exam  GENERAL:  76 y.o.-year-old patient lying in the bed with no acute distress.  LUNGS: Normal breath sounds bilaterally, no wheezing, rales,rhonchi or crepitation. No use of accessory muscles of respiration.  CARDIOVASCULAR: S1, S2 normal. No murmurs, rubs, or gallops.  ABDOMEN: Soft, non-tender, non-distended. Bowel sounds present. No organomegaly or mass.  NEUROLOGIC: Moves all 4 extremities. PSYCHIATRIC: The patient is alert and oriented x 3.  SKIN: No obvious rash, lesion, or  ulcer.   DATA REVIEW:   CBC  Recent Labs Lab 05/10/17 0453  WBC 6.4  HGB 7.9*  HCT 23.3*  PLT 348    Chemistries   Recent Labs Lab 05/09/17 1109 05/10/17 0453  NA 134* 139  K 4.7 4.8  CL 104 110  CO2 22 25  GLUCOSE 206* 103*  BUN 24* 20  CREATININE 1.28* 1.15  CALCIUM 9.1 8.8*  AST 35  --   ALT 17  --   ALKPHOS 62  --   BILITOT 0.3  --     Cardiac Enzymes No results for input(s): TROPONINI in the last 168 hours.  Microbiology Results  Results for orders placed or performed in visit on 09/30/11  Fecal occult blood, imunochemical     Status: None   Collection Time: 09/30/11  2:39 PM  Result Value Ref Range Status   Fecal Occult Bld Negative Negative Final    RADIOLOGY:  No results found.  Follow up with PCP in 1 week.  Management plans discussed with the patient, family and they are in agreement.  CODE STATUS:  Code Status History    Date Active Date Inactive Code Status Order ID Comments User Context   05/09/2017  3:00 PM 05/10/2017  3:57 PM Full Code [id number]  Vaughan Basta, MD ED   12/16/2016  4:32 PM 12/20/2016  5:36 PM Full Code [id number]  Theodoro Grist, MD Inpatient    Advance Directive Documentation     Most Recent Value  Type of Advance Directive  Healthcare Power of [place], Living will  Pre-existing out of facility DNR order (yellow form or pink MOST form)  -  "MOST" Form in Place?  -      TOTAL TIME TAKING CARE OF THIS PATIENT ON DAY OF DISCHARGE: more than 30 minutes.   Hillary Bow R M.D on 05/13/2017 at 11:48 AM  Between 7am to 6pm - Pager - [phone]  After 6pm go to [email or web address] - password [email or web address]  Office  [phone]  CC: Primary care physician; Venia Carbon, MD  Note: This dictation was prepared with Dragon dictation along with smaller phrase technology. Any transcriptional errors that result from this process are unintentional.

## 2017-05-15 ENCOUNTER — Inpatient Hospital Stay: Payer: Medicare Other | Attending: Oncology | Admitting: Oncology

## 2017-05-15 ENCOUNTER — Encounter: Payer: Self-pay | Admitting: Oncology

## 2017-05-15 ENCOUNTER — Inpatient Hospital Stay: Payer: Medicare Other

## 2017-05-15 ENCOUNTER — Other Ambulatory Visit: Payer: Self-pay | Admitting: Internal Medicine

## 2017-05-15 VITALS — BP 99/62 | HR 79 | Temp 98.2°F | Ht 69.0 in | Wt 201.6 lb

## 2017-05-15 DIAGNOSIS — Z801 Family history of malignant neoplasm of trachea, bronchus and lung: Secondary | ICD-10-CM | POA: Diagnosis not present

## 2017-05-15 DIAGNOSIS — E119 Type 2 diabetes mellitus without complications: Secondary | ICD-10-CM | POA: Diagnosis not present

## 2017-05-15 DIAGNOSIS — D500 Iron deficiency anemia secondary to blood loss (chronic): Secondary | ICD-10-CM | POA: Diagnosis not present

## 2017-05-15 DIAGNOSIS — M1990 Unspecified osteoarthritis, unspecified site: Secondary | ICD-10-CM | POA: Insufficient documentation

## 2017-05-15 DIAGNOSIS — Z8719 Personal history of other diseases of the digestive system: Secondary | ICD-10-CM

## 2017-05-15 DIAGNOSIS — Z79899 Other long term (current) drug therapy: Secondary | ICD-10-CM

## 2017-05-15 DIAGNOSIS — E039 Hypothyroidism, unspecified: Secondary | ICD-10-CM

## 2017-05-15 DIAGNOSIS — D649 Anemia, unspecified: Secondary | ICD-10-CM

## 2017-05-15 DIAGNOSIS — Z7984 Long term (current) use of oral hypoglycemic drugs: Secondary | ICD-10-CM | POA: Diagnosis not present

## 2017-05-15 DIAGNOSIS — F418 Other specified anxiety disorders: Secondary | ICD-10-CM

## 2017-05-15 DIAGNOSIS — R531 Weakness: Secondary | ICD-10-CM

## 2017-05-15 DIAGNOSIS — Z800 Family history of malignant neoplasm of digestive organs: Secondary | ICD-10-CM | POA: Insufficient documentation

## 2017-05-15 DIAGNOSIS — K921 Melena: Secondary | ICD-10-CM | POA: Diagnosis not present

## 2017-05-15 DIAGNOSIS — R5383 Other fatigue: Secondary | ICD-10-CM | POA: Insufficient documentation

## 2017-05-15 DIAGNOSIS — K649 Unspecified hemorrhoids: Secondary | ICD-10-CM | POA: Insufficient documentation

## 2017-05-15 DIAGNOSIS — E785 Hyperlipidemia, unspecified: Secondary | ICD-10-CM | POA: Insufficient documentation

## 2017-05-15 DIAGNOSIS — Z803 Family history of malignant neoplasm of breast: Secondary | ICD-10-CM | POA: Insufficient documentation

## 2017-05-15 DIAGNOSIS — R0609 Other forms of dyspnea: Secondary | ICD-10-CM | POA: Insufficient documentation

## 2017-05-15 LAB — CBC WITH DIFFERENTIAL/PLATELET
Basophils Absolute: 0.1 10*3/uL (ref 0–0.1)
Basophils Relative: 1 %
EOS ABS: 0.1 10*3/uL (ref 0–0.7)
EOS PCT: 2 %
HEMATOCRIT: 29.5 % — AB (ref 40.0–52.0)
HEMOGLOBIN: 9.9 g/dL — AB (ref 13.0–18.0)
LYMPHS ABS: 1.5 10*3/uL (ref 1.0–3.6)
Lymphocytes Relative: 20 %
MCH: 29.4 pg (ref 26.0–34.0)
MCHC: 33.6 g/dL (ref 32.0–36.0)
MCV: 87.6 fL (ref 80.0–100.0)
MONOS PCT: 8 %
Monocytes Absolute: 0.6 10*3/uL (ref 0.2–1.0)
NEUTROS PCT: 69 %
Neutro Abs: 5 10*3/uL (ref 1.4–6.5)
Platelets: 424 10*3/uL (ref 150–440)
RBC: 3.37 MIL/uL — ABNORMAL LOW (ref 4.40–5.90)
RDW: 17.8 % — ABNORMAL HIGH (ref 11.5–14.5)
WBC: 7.3 10*3/uL (ref 3.8–10.6)

## 2017-05-15 LAB — RETICULOCYTES
RBC.: 3.43 MIL/uL — AB (ref 4.40–5.90)
RETIC CT PCT: 4.4 % — AB (ref 0.4–3.1)
Retic Count, Absolute: 150.9 10*3/uL (ref 19.0–183.0)

## 2017-05-15 NOTE — Telephone Encounter (Signed)
Okay--good to hear

## 2017-05-15 NOTE — Progress Notes (Signed)
Hematology/Oncology Consult note [facility] [phone] [phone]  CONSULT NOTE Patient Care Team: Venia Carbon, MD as PCP - General  CHIEF COMPLAINTS/PURPOSE OF CONSULTATION:     HISTORY OF PRESENTING ILLNESS:  Randy Mcdaniel. 76 y.o.  male known history of anxiety, HLD, GI bleed- Recurrent hemorrhoids- s/p banding done, iron deficiency anemia who were referred by Dr.LetVak for iron deficiency anemia.  Patient follows up with GI and had lab work done which showed Hemoglobin of 6. He was sent to ER and had 2 units of PRBCs transfusion and also per GI recommendation, received 1 dose of Feraheme.   Patient reports feeling tired, and weakness. He has SOB with exertion. He still have blood  When he wipes after passing bowel movements. He lives with his wife. He is not active, spend day time mostly in his chair.    ROS:  Review of Systems  Constitutional: Positive for fatigue.  HENT:  Negative.   Eyes: Negative.   Respiratory: Negative.   Cardiovascular: Negative.   Gastrointestinal: Positive for blood in stool.  Endocrine: Negative.   Genitourinary: Negative.    Musculoskeletal: Negative.   Skin: Negative.   Neurological: Negative.   Hematological: Negative.   Psychiatric/Behavioral: Negative.     MEDICAL HISTORY:  Past Medical History:  Diagnosis Date  . Anxiety   . Depression    recurrent and severe  . Diabetes mellitus    Type II with nephropathy  . GI bleed   . HLD (hyperlipidemia)   . Hypothyroidism   . Internal hemorrhoid   . Osteoarthritis   . Personal history of colonic adenoma 12/03/2012    SURGICAL HISTORY: Past Surgical History:  Procedure Laterality Date  . COLONOSCOPY    . COLONOSCOPY WITH PROPOFOL N/A 12/20/2016   Procedure: COLONOSCOPY WITH PROPOFOL;  Surgeon: Jonathon Bellows, MD;  Location: [facility] ENDOSCOPY;  Service: Endoscopy;  Laterality: N/A;  . ESOPHAGOGASTRODUODENOSCOPY (EGD) WITH PROPOFOL N/A  12/17/2016   Procedure: ESOPHAGOGASTRODUODENOSCOPY (EGD) WITH PROPOFOL;  Surgeon: Wilford Corner, MD;  Location: [facility] ENDOSCOPY;  Service: Endoscopy;  Laterality: N/A;  . FLEXIBLE SIGMOIDOSCOPY N/A 12/17/2016   Procedure: FLEXIBLE SIGMOIDOSCOPY;  Surgeon: Wilford Corner, MD;  Location: [facility] ENDOSCOPY;  Service: Endoscopy;  Laterality: N/A;  . HEMORRHOID BANDING    . TOTAL KNEE ARTHROPLASTY  11/09   Right-Dr. Clair Gulling ([facility])  . TOTAL KNEE ARTHROPLASTY  6/10   Left-Dr. Clair Gulling ([facility])  . UMBILICAL HERNIA REPAIR  2000    SOCIAL HISTORY: Social History   Social History  . Marital status: Married    Spouse name: [name]  . Number of children: 2  . Years of education: N/A   Occupational History  . Retired    Social History Main Topics  . Smoking status: Never Smoker  . Smokeless tobacco: Former Systems developer    Types: Chew    Quit date: 05/15/2002  . Alcohol use No  . Drug use: No  . Sexual activity: Not on file   Other Topics Concern  . Not on file   Social History Narrative   Has living will.   No formal health care POA---requests wife to be   Would accept resuscitation attempts   No tube feeds if cognitively unaware    FAMILY HISTORY: Family History  Problem Relation Age of Onset  . Heart failure Father   . Diabetes Father   . Depression Father   . Coronary artery disease Father   . Breast cancer Sister   . Lung  cancer Sister   . Depression Sister   . Stomach cancer Mother   . Stroke Unknown        Paternal side  . Parkinsonism Unknown        Cousin  . Colon cancer Neg Hx   . Rectal cancer Neg Hx   . Esophageal cancer Neg Hx   . Colon polyps Neg Hx     ALLERGIES:  is allergic to vytorin [ezetimibe-simvastatin].  MEDICATIONS:  Current Outpatient Prescriptions  Medication Sig Dispense Refill  . acetaminophen (TYLENOL) 650 MG CR tablet Take 650 mg by mouth as needed.    Marland Kitchen atorvastatin (LIPITOR) 20 MG tablet TAKE 1 TABLET BY MOUTH ONCE DAILY 90 tablet 0  .  escitalopram (LEXAPRO) 20 MG tablet Take 1 tablet (20 mg total) by mouth every morning. 1 tablet 0  . Ferrous Sulfate (IRON SLOW RELEASE PO) Take 625 mg by mouth daily.    Marland Kitchen gabapentin (NEURONTIN) 300 MG capsule TAKE 1 TO 2 CAPSULES BY MOUTH AT BEDTIME 60 capsule 11  . glipiZIDE (GLUCOTROL) 5 MG tablet TAKE 1 TABLET BY MOUTH TWICE A DAY BEFORE A MEAL 180 tablet 1  . glucose blood test strip Dx: 250.40 100 each 6  . levothyroxine (SYNTHROID, LEVOTHROID) 75 MCG tablet TAKE 1 TABLET BY MOUTH ONCE A DAY 90 tablet 3  . lisinopril (PRINIVIL,ZESTRIL) 10 MG tablet TAKE 1 TABLET BY MOUTH ONCE DAILY 90 tablet 1  . metFORMIN (GLUCOPHAGE) 500 MG tablet TAKE 1 TABLET BY MOUTH 3 TIMES DAILY 270 tablet 1  . methylphenidate (RITALIN) 5 MG tablet Take 1 tablet (5 mg total) by mouth 2 (two) times daily. 60 tablet 0  . OLANZapine (ZYPREXA) 10 MG tablet TAKE 1 TABLET BY MOUTH AT BEDTIME 30 tablet 5  . vitamin B-12 (CYANOCOBALAMIN) 1000 MCG tablet Take 1,000 mcg by mouth daily.       No current facility-administered medications for this visit.       Marland Kitchen  PHYSICAL EXAMINATION: ECOG PERFORMANCE STATUS: 2 - Symptomatic, <50% confined to bed Vitals:   05/15/17 1524  BP: 99/62  Pulse: 79  Temp: 98.2 F (36.8 C)   Filed Weights   05/15/17 1524  Weight: 201 lb 9 oz (91.4 kg)    GENERAL:Alert, no distress and comfortable.  EYES: +pallor, no icterus OROPHARYNX: no thrush or ulceration; good dentition  NECK: supple, no masses felt LYMPH:  no palpable lymphadenopathy in the cervical, axillary or inguinal regions LUNGS: clear to auscultation and  No wheeze or crackles HEART/CVS: regular rate & rhythm and no murmurs; No lower extremity edema ABDOMEN: abdomen soft, non-tender and normal bowel sounds Musculoskeletal:no cyanosis of digits and no clubbing  PSYCH: alert & oriented x 3  NEURO: no focal motor/sensory deficits SKIN:  no rashes or significant lesions  LABORATORY DATA:  I have reviewed the data as  listed Lab Results  Component Value Date   WBC 7.3 05/15/2017   HGB 9.9 (L) 05/15/2017   HCT 29.5 (L) 05/15/2017   MCV 87.6 05/15/2017   PLT 424 05/15/2017    Recent Labs  07/06/16 0936 12/16/16 1131  12/19/16 2056 05/09/17 1109 05/10/17 0453  NA 142 138  < > 139 134* 139  K 4.2 4.8  < > 4.2 4.7 4.8  CL 102 106  < > 106 104 110  CO2 31 23  < > 27 22 25   GLUCOSE 168* 212*  < > 160* 206* 103*  BUN 21 29*  < > 16 24* 20  CREATININE 1.21 1.41*  < > 1.28* 1.28* 1.15  CALCIUM 10.0 9.4  < > 8.7* 9.1 8.8*  GFRNONAA  --  47*  < > 52* 52* 60*  GFRAA  --  54*  < > >60 >60 >60  PROT 7.4 7.6  --   --  6.6  --   ALBUMIN 4.4 4.2  --   --  3.7  --   AST 42* 44*  --   --  35  --   ALT 20 18  --   --  17  --   ALKPHOS 50 56  --   --  62  --   BILITOT 0.7 0.5  --   --  0.3  --   < > = values in this interval not displayed.  RADIOGRAPHIC STUDIES: I have personally reviewed the radiological images as listed and agreed with the findings in the report.  Colonoscopy 12/20/2016 - Preparation of the colon was poor. - Thrombosed external hemorrhoids found on digital rectal exam. - Stool in the rectum, in the sigmoid colon and in the descending colon. - The examination was otherwise normal.  ASSESSMENT & PLAN:  1. Iron deficiency anemia due to chronic blood loss   2. Anemia due to blood loss   3. Hemorrhoids, unspecified hemorrhoid type   4. Fatigue associated with anemia    Check baseline cbc, and reticulocyte.  S/p one dose of Feraheme during recent hospitalization. He reports tolerating well without infusion reaction.  Plan IV iron with Feraheme 510mg  x 1 dose. Allergy reactions/infusion reaction including anaphylactic reaction discussed with patient. Patient voices understanding and willing to proceed. Follow up with GI.   Follow up with me in 3 weeks with repeat CBC, iron TIBC,ferritin.  All questions were answered. The patient knows to call the clinic with any problems questions or  concerns.  Return of visit:  Thank you for this kind referral and the opportunity to participate in the care of this patient. A copy of today's note is routed to referring provider Dr.Letvak.    Earlie Server, MD, PhD [facility]ology [facility] Pager- [phone] 05/15/2017

## 2017-05-15 NOTE — Telephone Encounter (Signed)
Patient has an appt today with [facility]. They had been calling the patient since Monday and the patient never called them back after they had left messages. [facility] wanted you to know.

## 2017-05-15 NOTE — Telephone Encounter (Signed)
Please call wife to see what is going on. Did they know about the appt?

## 2017-05-15 NOTE — Progress Notes (Signed)
Patient here today as a new patient for anemia / low iron.

## 2017-05-15 NOTE — Telephone Encounter (Signed)
Spoke to pt who states he was contacted and is aware of his 1500 appt today and he plans to attend

## 2017-05-17 ENCOUNTER — Ambulatory Visit (INDEPENDENT_AMBULATORY_CARE_PROVIDER_SITE_OTHER): Payer: Medicare Other | Admitting: Internal Medicine

## 2017-05-17 ENCOUNTER — Encounter: Payer: Self-pay | Admitting: Internal Medicine

## 2017-05-17 VITALS — BP 106/70 | HR 86 | Temp 98.6°F | Wt 203.0 lb

## 2017-05-17 DIAGNOSIS — K625 Hemorrhage of anus and rectum: Secondary | ICD-10-CM | POA: Diagnosis not present

## 2017-05-17 DIAGNOSIS — Z23 Encounter for immunization: Secondary | ICD-10-CM | POA: Diagnosis not present

## 2017-05-17 DIAGNOSIS — D500 Iron deficiency anemia secondary to blood loss (chronic): Secondary | ICD-10-CM | POA: Diagnosis not present

## 2017-05-17 NOTE — Addendum Note (Signed)
Addended by: Pilar Grammes on: 05/17/2017 05:09 PM   Modules accepted: Orders

## 2017-05-17 NOTE — Assessment & Plan Note (Addendum)
Persisting despite attempts at banding It is time to have a surgical evaluation to see if that is an option Will set this up Asked him to try miralax so he doesn't strain

## 2017-05-17 NOTE — Progress Notes (Signed)
Subjective:    Patient ID: Randy Land., male    DOB: 12-06-1940, 76 y.o.   MRN: [id number]  HPI Here for hospital follow up with wife Reviewed hospital records Reviewed Dr Celesta Aver and Dr Collie Siad evaluations  Ongoing bleeding that goes back months Did have banding once but the bleeding seemed worse this time than before Just couldn't get it stopped--then felt very weak Hgb down to 6 Was transfused and got one iron infusion Due for another iron infusion on 9/24  Still some rectal bleeding since leaving the hospital Not bad though Feels a little better but still weak Bowels move every 2-3 days--has to strain at times Using metamucil and stool softeners  Current Outpatient Prescriptions on File Prior to Visit  Medication Sig Dispense Refill  . acetaminophen (TYLENOL) 650 MG CR tablet Take 650 mg by mouth as needed.    Marland Kitchen atorvastatin (LIPITOR) 20 MG tablet TAKE 1 TABLET BY MOUTH ONCE DAILY 90 tablet 0  . escitalopram (LEXAPRO) 20 MG tablet Take 1 tablet (20 mg total) by mouth every morning. 1 tablet 0  . Ferrous Sulfate (IRON SLOW RELEASE PO) Take 625 mg by mouth daily.    Marland Kitchen gabapentin (NEURONTIN) 300 MG capsule TAKE 1 TO 2 CAPSULES BY MOUTH AT BEDTIME 60 capsule 11  . glipiZIDE (GLUCOTROL) 5 MG tablet TAKE 1 TABLET BY MOUTH TWICE A DAY BEFORE A MEAL 180 tablet 1  . glucose blood test strip Dx: 250.40 100 each 6  . levothyroxine (SYNTHROID, LEVOTHROID) 75 MCG tablet TAKE 1 TABLET BY MOUTH ONCE A DAY 90 tablet 3  . lisinopril (PRINIVIL,ZESTRIL) 10 MG tablet TAKE 1 TABLET BY MOUTH ONCE DAILY 90 tablet 1  . metFORMIN (GLUCOPHAGE) 500 MG tablet TAKE 1 TABLET BY MOUTH 3 TIMES DAILY 270 tablet 1  . methylphenidate (RITALIN) 5 MG tablet Take 1 tablet (5 mg total) by mouth 2 (two) times daily. 60 tablet 0  . OLANZapine (ZYPREXA) 10 MG tablet TAKE 1 TABLET BY MOUTH AT BEDTIME 30 tablet 5  . vitamin B-12 (CYANOCOBALAMIN) 1000 MCG tablet Take 1,000 mcg by mouth daily.       No  current facility-administered medications on file prior to visit.     Allergies  Allergen Reactions  . Vytorin [Ezetimibe-Simvastatin]     REACTION: nausea    Past Medical History:  Diagnosis Date  . Anxiety   . Depression    recurrent and severe  . Diabetes mellitus    Type II with nephropathy  . GI bleed   . HLD (hyperlipidemia)   . Hypothyroidism   . Internal hemorrhoid   . Osteoarthritis   . Personal history of colonic adenoma 12/03/2012    Past Surgical History:  Procedure Laterality Date  . COLONOSCOPY    . COLONOSCOPY WITH PROPOFOL N/A 12/20/2016   Procedure: COLONOSCOPY WITH PROPOFOL;  Surgeon: Jonathon Bellows, MD;  Location: [facility] ENDOSCOPY;  Service: Endoscopy;  Laterality: N/A;  . ESOPHAGOGASTRODUODENOSCOPY (EGD) WITH PROPOFOL N/A 12/17/2016   Procedure: ESOPHAGOGASTRODUODENOSCOPY (EGD) WITH PROPOFOL;  Surgeon: Wilford Corner, MD;  Location: [facility] ENDOSCOPY;  Service: Endoscopy;  Laterality: N/A;  . FLEXIBLE SIGMOIDOSCOPY N/A 12/17/2016   Procedure: FLEXIBLE SIGMOIDOSCOPY;  Surgeon: Wilford Corner, MD;  Location: [facility] ENDOSCOPY;  Service: Endoscopy;  Laterality: N/A;  . HEMORRHOID BANDING    . TOTAL KNEE ARTHROPLASTY  11/09   Right-Dr. Clair Gulling ([facility])  . TOTAL KNEE ARTHROPLASTY  6/10   Left-Dr. Clair Gulling ([facility])  . UMBILICAL HERNIA REPAIR  2000  Family History  Problem Relation Age of Onset  . Heart failure Father   . Diabetes Father   . Depression Father   . Coronary artery disease Father   . Breast cancer Sister   . Lung cancer Sister   . Depression Sister   . Stomach cancer Mother   . Stroke Unknown        Paternal side  . Parkinsonism Unknown        Cousin  . Colon cancer Neg Hx   . Rectal cancer Neg Hx   . Esophageal cancer Neg Hx   . Colon polyps Neg Hx     Social History   Social History  . Marital status: Married    Spouse name: [name]  . Number of children: 2  . Years of education: N/A   Occupational History  . Retired    Social History  Main Topics  . Smoking status: Never Smoker  . Smokeless tobacco: Former Systems developer    Types: Chew    Quit date: 05/15/2002  . Alcohol use No  . Drug use: No  . Sexual activity: Not on file   Other Topics Concern  . Not on file   Social History Narrative   Has living will.   No formal health care POA---requests wife to be   Would accept resuscitation attempts   No tube feeds if cognitively unaware   Review of Systems Some SOB with activity Appetite is good    Objective:   Physical Exam  Constitutional:  Slightly pale but NAD  Neck: No thyromegaly present.  Cardiovascular: Normal rate, regular rhythm and normal heart sounds.  Exam reveals no gallop.   No murmur heard. Rate 66  Pulmonary/Chest: Effort normal and breath sounds normal. No respiratory distress. He has no wheezes. He has no rales.  Abdominal: Soft. He exhibits no distension. There is no tenderness. There is no rebound and no guarding.  Musculoskeletal: He exhibits no edema.  Lymphadenopathy:    He has no cervical adenopathy.          Assessment & Plan:

## 2017-05-17 NOTE — Assessment & Plan Note (Signed)
Better after transfusion and has had iron infusions

## 2017-05-18 ENCOUNTER — Ambulatory Visit (INDEPENDENT_AMBULATORY_CARE_PROVIDER_SITE_OTHER): Payer: Medicare Other | Admitting: General Surgery

## 2017-05-18 ENCOUNTER — Telehealth: Payer: Self-pay | Admitting: *Deleted

## 2017-05-18 ENCOUNTER — Encounter: Payer: Self-pay | Admitting: General Surgery

## 2017-05-18 VITALS — BP 122/68 | HR 78 | Resp 15 | Ht 70.0 in | Wt 202.0 lb

## 2017-05-18 DIAGNOSIS — K648 Other hemorrhoids: Secondary | ICD-10-CM

## 2017-05-18 DIAGNOSIS — K625 Hemorrhage of anus and rectum: Secondary | ICD-10-CM

## 2017-05-18 MED ORDER — HYDROCORTISONE ACE-PRAMOXINE 2.5-1 % RE CREA: 1.0000 "application " | TOPICAL_CREAM | Freq: Two times a day (BID) | RECTAL | 0 refills | Status: DC

## 2017-05-18 NOTE — Telephone Encounter (Signed)
Pharmacy called to ask about a substitution for analpram cream, Proctosol HC if patient request this because of cost, ok'd.

## 2017-05-18 NOTE — Addendum Note (Signed)
Addended by: Carson Myrtle on: 05/18/2017 01:57 PM   Modules accepted: Orders

## 2017-05-18 NOTE — Progress Notes (Signed)
Patient ID: Randy Land., male   DOB: 07/21/1941, 76 y.o.   MRN: [id number]  Chief Complaint  Patient presents with  . Hemorrhoids    HPI Randy Rising. is a 76 y.o. male here today for evaluation of bleeding internal hemorrhoids. He has had problems with bleeding hemorrhoids for the past 2 years off and on. It has worsened recently with him receiving 2 units of PRBCs transfusion in the hospital on 05/09/17. He had hemorrhoid banding done last week and states that this did not stop the bleeding. He reports that he has a bowel movement every 2-3 days and it is usually hard. He is going to start using Miralax as the Metamucil and stool softener did not help. Rectal pain only occurs with bowel movements and he reports both dripping and gushing blood. Last colonoscopy done 12/20/2016 and showed internal hemorrhoids. He is due for another iron transfusion at the cancer center on 9/24 and sees Dr. Tasia Catchings.     HPI  Past Medical History:  Diagnosis Date  . Anxiety   . Depression    recurrent and severe  . Diabetes mellitus    Type II with nephropathy  . GI bleed   . HLD (hyperlipidemia)   . Hypothyroidism   . Internal hemorrhoid   . Osteoarthritis   . Personal history of colonic adenoma 12/03/2012    Past Surgical History:  Procedure Laterality Date  . COLONOSCOPY    . COLONOSCOPY WITH PROPOFOL N/A 12/20/2016   Procedure: COLONOSCOPY WITH PROPOFOL;  Surgeon: Jonathon Bellows, MD;  Location: [facility] ENDOSCOPY;  Service: Endoscopy;  Laterality: N/A;  . ESOPHAGOGASTRODUODENOSCOPY (EGD) WITH PROPOFOL N/A 12/17/2016   Procedure: ESOPHAGOGASTRODUODENOSCOPY (EGD) WITH PROPOFOL;  Surgeon: Wilford Corner, MD;  Location: [facility] ENDOSCOPY;  Service: Endoscopy;  Laterality: N/A;  . FLEXIBLE SIGMOIDOSCOPY N/A 12/17/2016   Procedure: FLEXIBLE SIGMOIDOSCOPY;  Surgeon: Wilford Corner, MD;  Location: [facility] ENDOSCOPY;  Service: Endoscopy;  Laterality: N/A;  . HEMORRHOID BANDING  04/2017  . TOTAL KNEE  ARTHROPLASTY  11/09   Right-Dr. Clair Gulling ([facility])  . TOTAL KNEE ARTHROPLASTY  6/10   Left-Dr. Clair Gulling ([facility])  . UMBILICAL HERNIA REPAIR  2000    Family History  Problem Relation Age of Onset  . Heart failure Father   . Diabetes Father   . Depression Father   . Coronary artery disease Father   . Breast cancer Sister 38  . Lung cancer Sister   . Depression Sister   . Stomach cancer Mother 73  . Stroke Unknown        Paternal side  . Parkinsonism Unknown        Cousin  . Colon cancer Neg Hx   . Rectal cancer Neg Hx   . Esophageal cancer Neg Hx   . Colon polyps Neg Hx     Social History Social History  Substance Use Topics  . Smoking status: Never Smoker  . Smokeless tobacco: Former Systems developer    Types: Chew    Quit date: 05/15/2002  . Alcohol use No    Allergies  Allergen Reactions  . Vytorin [Ezetimibe-Simvastatin]     REACTION: nausea    Current Outpatient Prescriptions  Medication Sig Dispense Refill  . acetaminophen (TYLENOL) 650 MG CR tablet Take 650 mg by mouth as needed.    Marland Kitchen atorvastatin (LIPITOR) 20 MG tablet TAKE 1 TABLET BY MOUTH ONCE DAILY 90 tablet 0  . escitalopram (LEXAPRO) 20 MG tablet Take 1 tablet (20 mg total) by mouth  every morning. 1 tablet 0  . Ferrous Sulfate (IRON SLOW RELEASE PO) Take 625 mg by mouth daily.    Marland Kitchen gabapentin (NEURONTIN) 300 MG capsule TAKE 1 TO 2 CAPSULES BY MOUTH AT BEDTIME 60 capsule 11  . glipiZIDE (GLUCOTROL) 5 MG tablet TAKE 1 TABLET BY MOUTH TWICE A DAY BEFORE A MEAL 180 tablet 1  . glucose blood test strip Dx: 250.40 100 each 6  . levothyroxine (SYNTHROID, LEVOTHROID) 75 MCG tablet TAKE 1 TABLET BY MOUTH ONCE A DAY 90 tablet 3  . lisinopril (PRINIVIL,ZESTRIL) 10 MG tablet TAKE 1 TABLET BY MOUTH ONCE DAILY 90 tablet 1  . metFORMIN (GLUCOPHAGE) 500 MG tablet TAKE 1 TABLET BY MOUTH 3 TIMES DAILY 270 tablet 1  . methylphenidate (RITALIN) 5 MG tablet Take 1 tablet (5 mg total) by mouth 2 (two) times daily. 60 tablet 0  . OLANZapine  (ZYPREXA) 10 MG tablet TAKE 1 TABLET BY MOUTH AT BEDTIME 30 tablet 5  . polyethylene glycol (MIRALAX / GLYCOLAX) packet Take 17 g by mouth daily.    . vitamin B-12 (CYANOCOBALAMIN) 1000 MCG tablet Take 1,000 mcg by mouth daily.       No current facility-administered medications for this visit.     Review of Systems Review of Systems  Constitutional: Negative.   Respiratory: Negative.   Cardiovascular: Negative.   Gastrointestinal: Positive for anal bleeding and blood in stool. Negative for abdominal distention, abdominal pain, constipation, diarrhea, nausea, rectal pain and vomiting.    Blood pressure 122/68, pulse 78, resp. rate 15, height 5\' 10"  (1.778 m), weight 202 lb (91.6 kg).  Physical Exam Physical Exam  Constitutional: He is oriented to person, place, and time. He appears well-developed and well-nourished.  Eyes: Conjunctivae are normal. No scleral icterus.  Cardiovascular: Normal rate, regular rhythm and normal heart sounds.   Pulmonary/Chest: Effort normal and breath sounds normal.  Abdominal: Soft. Bowel sounds are normal. There is no tenderness.    Genitourinary:     Neurological: He is alert and oriented to person, place, and time.  Skin: Skin is warm and dry.    Data Reviewed Prior colonoscopy and notes reviewed.  Assessment    Internal hemorrhoids - he has had multiple hemorrhoid banding procedures with the last occurring 1 week ago, but continues to have rectal bleeding and pain with bowel movements. No active bleeding visualized today.    Plan    Apply analpram twice a day to rectum and return in 10 days for reevaluation. Continue to follow up at cancer center for iron infusion. Iron deficiency anemia from rectal bleeding is being monitored by Dr. Silvio Pate and Dr. Tasia Catchings.       HPI, Physical Exam, Assessment and Plan have been scribed under the direction and in the presence of Mckinley Jewel, MD  Concepcion Living, LPN  I have completed the exam and  reviewed the above documentation for accuracy and completeness.  I agree with the above.  Haematologist has been used and any errors in dictation or transcription are unintentional.  Sravya Grissom G. Jamal Collin, M.D., F.A.C.S.  Junie Panning G 05/18/2017, 9:51 AM

## 2017-05-18 NOTE — Patient Instructions (Signed)
Apply analpram twice a day to rectum and return in 10 days.

## 2017-05-19 ENCOUNTER — Inpatient Hospital Stay: Payer: Medicare Other

## 2017-05-22 ENCOUNTER — Inpatient Hospital Stay: Payer: Medicare Other

## 2017-05-22 VITALS — BP 113/74 | HR 90 | Temp 98.2°F | Resp 16

## 2017-05-22 DIAGNOSIS — D500 Iron deficiency anemia secondary to blood loss (chronic): Secondary | ICD-10-CM | POA: Diagnosis not present

## 2017-05-22 MED ORDER — SODIUM CHLORIDE 0.9 % IV SOLN
Freq: Once | INTRAVENOUS | Status: AC
  Administered 2017-05-22: 14:00:00 via INTRAVENOUS
  Filled 2017-05-22: qty 1000

## 2017-05-22 MED ORDER — SODIUM CHLORIDE 0.9 % IV SOLN
510.0000 mg | Freq: Once | INTRAVENOUS | Status: AC
  Administered 2017-05-22: 510 mg via INTRAVENOUS
  Filled 2017-05-22: qty 17

## 2017-05-26 ENCOUNTER — Telehealth: Payer: Self-pay

## 2017-05-26 NOTE — Telephone Encounter (Signed)
Randy Mcdaniel left v/m; Randy Mcdaniel said Dr Silvio Pate is aware of issues pt is having; Randy Mcdaniel has spoken to Dr Jacalyn Lefevre office and was advised to contact PCP; pt has been sitting on commode since 7 AM; diarrhea will not stop; pt has taken immodium x 4 and instructions are not to give more than 4 in 24 hr period. Randy Mcdaniel request cb with what to do.

## 2017-05-26 NOTE — Telephone Encounter (Signed)
Spoke to wife Not really diarrhea but sitting on commode for 4 hours and wiping stool. Some blood from all the wiping and seems more swollen Advised her to use the cortisone cream regularly to reduce the inflammation Stop the imodium Has follow up with Dr Jamal Collin next week

## 2017-06-01 ENCOUNTER — Ambulatory Visit (INDEPENDENT_AMBULATORY_CARE_PROVIDER_SITE_OTHER): Payer: Medicare Other | Admitting: General Surgery

## 2017-06-01 ENCOUNTER — Encounter: Payer: Self-pay | Admitting: General Surgery

## 2017-06-01 VITALS — BP 100/52 | HR 66 | Resp 16 | Ht 70.0 in | Wt 195.0 lb

## 2017-06-01 DIAGNOSIS — K625 Hemorrhage of anus and rectum: Secondary | ICD-10-CM | POA: Diagnosis not present

## 2017-06-01 DIAGNOSIS — K648 Other hemorrhoids: Secondary | ICD-10-CM | POA: Diagnosis not present

## 2017-06-01 NOTE — Progress Notes (Signed)
Patient ID: [name], male   DOB: September 04, 1940, 76 y.o.   MRN: [id number]  Chief Complaint  Patient presents with  . Follow-up    HPI SYE SCHROEPFER Rinaldo Macqueen. is a 76 y.o. male here today for internal hemorrhoids. Last visit he had a hemorrhoid banding done. He states he is still having some rectal bleeding.He received his iron transfusion. He is going to the [facility] tomorrow and will be seeing Dr. Tasia Catchings on Monday. Wife is present with him today.   HPI  Past Medical History:  Diagnosis Date  . Anxiety   . Depression    recurrent and severe  . Diabetes mellitus    Type II with nephropathy  . GI bleed   . HLD (hyperlipidemia)   . Hypothyroidism   . Internal hemorrhoid   . Osteoarthritis   . Personal history of colonic adenoma 12/03/2012    Past Surgical History:  Procedure Laterality Date  . COLONOSCOPY    . COLONOSCOPY WITH PROPOFOL N/A 12/20/2016   Procedure: COLONOSCOPY WITH PROPOFOL;  Surgeon: Jonathon Bellows, MD;  Location: [facility] ENDOSCOPY;  Service: Endoscopy;  Laterality: N/A;  . ESOPHAGOGASTRODUODENOSCOPY (EGD) WITH PROPOFOL N/A 12/17/2016   Procedure: ESOPHAGOGASTRODUODENOSCOPY (EGD) WITH PROPOFOL;  Surgeon: Wilford Corner, MD;  Location: [facility] ENDOSCOPY;  Service: Endoscopy;  Laterality: N/A;  . FLEXIBLE SIGMOIDOSCOPY N/A 12/17/2016   Procedure: FLEXIBLE SIGMOIDOSCOPY;  Surgeon: Wilford Corner, MD;  Location: [facility] ENDOSCOPY;  Service: Endoscopy;  Laterality: N/A;  . HEMORRHOID BANDING  04/2017  . TOTAL KNEE ARTHROPLASTY  11/09   Right-Dr. Clair Gulling ([facility])  . TOTAL KNEE ARTHROPLASTY  6/10   Left-Dr. Clair Gulling ([facility])  . UMBILICAL HERNIA REPAIR  2000    Family History  Problem Relation Age of Onset  . Heart failure Father   . Diabetes Father   . Depression Father   . Coronary artery disease Father   . Breast cancer Sister 55  . Lung cancer Sister   . Depression Sister   . Stomach cancer Mother 50  . Stroke Unknown        Paternal side  . Parkinsonism Unknown         Cousin  . Colon cancer Neg Hx   . Rectal cancer Neg Hx   . Esophageal cancer Neg Hx   . Colon polyps Neg Hx     Social History Social History  Substance Use Topics  . Smoking status: Never Smoker  . Smokeless tobacco: Former Systems developer    Types: Chew    Quit date: 05/15/2002  . Alcohol use No    Allergies  Allergen Reactions  . Vytorin [Ezetimibe-Simvastatin]     REACTION: nausea    Current Outpatient Prescriptions  Medication Sig Dispense Refill  . acetaminophen (TYLENOL) 650 MG CR tablet Take 650 mg by mouth as needed.    Marland Kitchen atorvastatin (LIPITOR) 20 MG tablet TAKE 1 TABLET BY MOUTH ONCE DAILY 90 tablet 0  . escitalopram (LEXAPRO) 20 MG tablet Take 1 tablet (20 mg total) by mouth every morning. 1 tablet 0  . Ferrous Sulfate (IRON SLOW RELEASE PO) Take 625 mg by mouth daily.    Marland Kitchen gabapentin (NEURONTIN) 300 MG capsule TAKE 1 TO 2 CAPSULES BY MOUTH AT BEDTIME 60 capsule 11  . glipiZIDE (GLUCOTROL) 5 MG tablet TAKE 1 TABLET BY MOUTH TWICE A DAY BEFORE A MEAL 180 tablet 1  . glucose blood test strip Dx: 250.40 100 each 6  . hydrocortisone-pramoxine (ANALPRAM HC) 2.5-1 % rectal cream Place  1 application rectally 2 (two) times daily. 30 g 0  . levothyroxine (SYNTHROID, LEVOTHROID) 75 MCG tablet TAKE 1 TABLET BY MOUTH ONCE A DAY 90 tablet 3  . lisinopril (PRINIVIL,ZESTRIL) 10 MG tablet TAKE 1 TABLET BY MOUTH ONCE DAILY 90 tablet 1  . metFORMIN (GLUCOPHAGE) 500 MG tablet TAKE 1 TABLET BY MOUTH 3 TIMES DAILY 270 tablet 1  . methylphenidate (RITALIN) 5 MG tablet Take 1 tablet (5 mg total) by mouth 2 (two) times daily. 60 tablet 0  . OLANZapine (ZYPREXA) 10 MG tablet TAKE 1 TABLET BY MOUTH AT BEDTIME 30 tablet 5  . polyethylene glycol (MIRALAX / GLYCOLAX) packet Take 17 g by mouth daily.    . vitamin B-12 (CYANOCOBALAMIN) 1000 MCG tablet Take 1,000 mcg by mouth daily.       No current facility-administered medications for this visit.     Review of Systems Review of Systems    Constitutional: Negative.   Respiratory: Negative.   Cardiovascular: Negative.     Blood pressure (!) 100/52, pulse 66, resp. rate 16, height 5\' 10"  (1.778 m), weight 195 lb (88.5 kg).  Physical Exam Physical Exam  Constitutional: He is oriented to person, place, and time. He appears well-developed and well-nourished.  Eyes: Conjunctivae are normal. No scleral icterus.  Neck: Neck supple.  Cardiovascular: Normal rate, regular rhythm and normal heart sounds.   Pulmonary/Chest: Effort normal and breath sounds normal.  Abdominal: Soft. Bowel sounds are normal. He exhibits no distension and no mass. There is no tenderness.  Genitourinary: Rectal exam shows internal hemorrhoid.     Lymphadenopathy:    He has no cervical adenopathy.  Neurological: He is alert and oriented to person, place, and time.  Skin: Skin is warm and dry.  Psychiatric: He has a normal mood and affect. His behavior is normal.    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 Hgb on 9/17 was 9.9 and he is due for another redraw tomorrow. Most recent colonoscopy on 12/20/16 was benign for higher source of bleeding. Due to continued bleeding, will schedule for internal hemorrhoidectomy with patient and his wife's agreement.     Plan    Will schedule for internal hemorrhoidectomy and proctoscopy in the near future. Continue to follow up at the [facility] and with Dr. Tasia Catchings to monitor iron deficiency anemia.   HPI, Physical Exam, Assessment and Plan have been scribed under the direction and in the presence of Mckinley Jewel, MD  Gaspar Cola, CMA     I have completed the exam and reviewed the above documentation for accuracy and completeness.  I agree with the above.  Haematologist has been  used and any errors in dictation or transcription are unintentional.  Dwyane Dupree G. Jamal Collin, M.D., F.A.C.S.   Junie Panning G 06/01/2017, 9:14 AM  Patient's surgery has been scheduled for 06-14-17 at [facility].   Dominga Ferry, CMA

## 2017-06-01 NOTE — Patient Instructions (Addendum)
Will schedule for hemorrhoidectomy with proctoscopy in the near future. Continue to follow up at the [facility] and with Dr. Tasia Catchings to monitor iron deficiency anemia.

## 2017-06-02 ENCOUNTER — Encounter (HOSPITAL_COMMUNITY): Payer: Self-pay

## 2017-06-02 ENCOUNTER — Telehealth: Payer: Self-pay | Admitting: *Deleted

## 2017-06-02 ENCOUNTER — Inpatient Hospital Stay (HOSPITAL_COMMUNITY)
Admission: EM | Admit: 2017-06-02 | Discharge: 2017-06-04 | DRG: 377 | Disposition: A | Discharge status: Home / Self Care | Payer: Medicare Other | Attending: Internal Medicine | Admitting: Internal Medicine

## 2017-06-02 ENCOUNTER — Other Ambulatory Visit: Payer: Self-pay

## 2017-06-02 ENCOUNTER — Inpatient Hospital Stay: Payer: Medicare Other

## 2017-06-02 ENCOUNTER — Emergency Department (HOSPITAL_COMMUNITY): Payer: Medicare Other

## 2017-06-02 ENCOUNTER — Telehealth: Payer: Self-pay

## 2017-06-02 DIAGNOSIS — Z800 Family history of malignant neoplasm of digestive organs: Secondary | ICD-10-CM | POA: Diagnosis not present

## 2017-06-02 DIAGNOSIS — D62 Acute posthemorrhagic anemia: Secondary | ICD-10-CM | POA: Diagnosis present

## 2017-06-02 DIAGNOSIS — M48062 Spinal stenosis, lumbar region with neurogenic claudication: Secondary | ICD-10-CM | POA: Diagnosis present

## 2017-06-02 DIAGNOSIS — R578 Other shock: Secondary | ICD-10-CM | POA: Diagnosis present

## 2017-06-02 DIAGNOSIS — E1142 Type 2 diabetes mellitus with diabetic polyneuropathy: Secondary | ICD-10-CM | POA: Diagnosis present

## 2017-06-02 DIAGNOSIS — K625 Hemorrhage of anus and rectum: Secondary | ICD-10-CM | POA: Diagnosis not present

## 2017-06-02 DIAGNOSIS — K642 Third degree hemorrhoids: Secondary | ICD-10-CM | POA: Diagnosis present

## 2017-06-02 DIAGNOSIS — F329 Major depressive disorder, single episode, unspecified: Secondary | ICD-10-CM | POA: Diagnosis present

## 2017-06-02 DIAGNOSIS — K5731 Diverticulosis of large intestine without perforation or abscess with bleeding: Secondary | ICD-10-CM | POA: Diagnosis present

## 2017-06-02 DIAGNOSIS — Z833 Family history of diabetes mellitus: Secondary | ICD-10-CM | POA: Diagnosis not present

## 2017-06-02 DIAGNOSIS — R1010 Upper abdominal pain, unspecified: Secondary | ICD-10-CM | POA: Diagnosis not present

## 2017-06-02 DIAGNOSIS — N179 Acute kidney failure, unspecified: Secondary | ICD-10-CM | POA: Diagnosis present

## 2017-06-02 DIAGNOSIS — E785 Hyperlipidemia, unspecified: Secondary | ICD-10-CM | POA: Diagnosis present

## 2017-06-02 DIAGNOSIS — E1149 Type 2 diabetes mellitus with other diabetic neurological complication: Secondary | ICD-10-CM | POA: Diagnosis present

## 2017-06-02 DIAGNOSIS — R109 Unspecified abdominal pain: Secondary | ICD-10-CM

## 2017-06-02 DIAGNOSIS — E039 Hypothyroidism, unspecified: Secondary | ICD-10-CM | POA: Diagnosis present

## 2017-06-02 DIAGNOSIS — K641 Second degree hemorrhoids: Secondary | ICD-10-CM | POA: Diagnosis present

## 2017-06-02 DIAGNOSIS — K922 Gastrointestinal hemorrhage, unspecified: Secondary | ICD-10-CM | POA: Diagnosis not present

## 2017-06-02 DIAGNOSIS — Z820 Family history of epilepsy and other diseases of the nervous system: Secondary | ICD-10-CM

## 2017-06-02 DIAGNOSIS — I10 Essential (primary) hypertension: Secondary | ICD-10-CM | POA: Diagnosis present

## 2017-06-02 DIAGNOSIS — Z818 Family history of other mental and behavioral disorders: Secondary | ICD-10-CM

## 2017-06-02 DIAGNOSIS — Z803 Family history of malignant neoplasm of breast: Secondary | ICD-10-CM | POA: Diagnosis not present

## 2017-06-02 DIAGNOSIS — Z888 Allergy status to other drugs, medicaments and biological substances status: Secondary | ICD-10-CM

## 2017-06-02 DIAGNOSIS — Z801 Family history of malignant neoplasm of trachea, bronchus and lung: Secondary | ICD-10-CM

## 2017-06-02 DIAGNOSIS — F410 Panic disorder [episodic paroxysmal anxiety] without agoraphobia: Secondary | ICD-10-CM | POA: Diagnosis present

## 2017-06-02 DIAGNOSIS — D649 Anemia, unspecified: Secondary | ICD-10-CM

## 2017-06-02 DIAGNOSIS — M1990 Unspecified osteoarthritis, unspecified site: Secondary | ICD-10-CM | POA: Diagnosis present

## 2017-06-02 DIAGNOSIS — E875 Hyperkalemia: Secondary | ICD-10-CM | POA: Diagnosis present

## 2017-06-02 DIAGNOSIS — K644 Residual hemorrhoidal skin tags: Secondary | ICD-10-CM | POA: Diagnosis present

## 2017-06-02 DIAGNOSIS — Z7984 Long term (current) use of oral hypoglycemic drugs: Secondary | ICD-10-CM

## 2017-06-02 DIAGNOSIS — Z87891 Personal history of nicotine dependence: Secondary | ICD-10-CM

## 2017-06-02 DIAGNOSIS — Z96653 Presence of artificial knee joint, bilateral: Secondary | ICD-10-CM | POA: Diagnosis present

## 2017-06-02 DIAGNOSIS — Z8249 Family history of ischemic heart disease and other diseases of the circulatory system: Secondary | ICD-10-CM

## 2017-06-02 DIAGNOSIS — R55 Syncope and collapse: Secondary | ICD-10-CM | POA: Diagnosis present

## 2017-06-02 HISTORY — DX: Unspecified dementia, unspecified severity, without behavioral disturbance, psychotic disturbance, mood disturbance, and anxiety: F03.90

## 2017-06-02 LAB — CBC WITH DIFFERENTIAL/PLATELET
BASOS ABS: 0 10*3/uL (ref 0.0–0.1)
BASOS PCT: 0 %
EOS ABS: 0.1 10*3/uL (ref 0.0–0.7)
Eosinophils Relative: 1 %
HEMATOCRIT: 24.5 % — AB (ref 39.0–52.0)
HEMOGLOBIN: 8.1 g/dL — AB (ref 13.0–17.0)
Lymphocytes Relative: 17 %
Lymphs Abs: 1.4 10*3/uL (ref 0.7–4.0)
MCH: 30.5 pg (ref 26.0–34.0)
MCHC: 33.1 g/dL (ref 30.0–36.0)
MCV: 92.1 fL (ref 78.0–100.0)
Monocytes Absolute: 0.5 10*3/uL (ref 0.1–1.0)
Monocytes Relative: 7 %
NEUTROS ABS: 6.2 10*3/uL (ref 1.7–7.7)
NEUTROS PCT: 75 %
Platelets: 243 10*3/uL (ref 150–400)
RBC: 2.66 MIL/uL — AB (ref 4.22–5.81)
RDW: 17.2 % — ABNORMAL HIGH (ref 11.5–15.5)
WBC: 8.2 10*3/uL (ref 4.0–10.5)

## 2017-06-02 LAB — CBC
HEMATOCRIT: 23.1 % — AB (ref 39.0–52.0)
HEMATOCRIT: 27.3 % — AB (ref 39.0–52.0)
HEMATOCRIT: 25.8 % — AB (ref 39.0–52.0)
HEMOGLOBIN: 7.1 g/dL — AB (ref 13.0–17.0)
HEMOGLOBIN: 8.8 g/dL — AB (ref 13.0–17.0)
Hemoglobin: 8.4 g/dL — ABNORMAL LOW (ref 13.0–17.0)
MCH: 29 pg (ref 26.0–34.0)
MCH: 29.7 pg (ref 26.0–34.0)
MCH: 30 pg (ref 26.0–34.0)
MCHC: 30.7 g/dL (ref 30.0–36.0)
MCHC: 32.2 g/dL (ref 30.0–36.0)
MCHC: 32.6 g/dL (ref 30.0–36.0)
MCV: 94.3 fL (ref 78.0–100.0)
MCV: 92.2 fL (ref 78.0–100.0)
MCV: 92.1 fL (ref 78.0–100.0)
PLATELETS: 261 10*3/uL (ref 150–400)
Platelets: 327 10*3/uL (ref 150–400)
Platelets: 264 10*3/uL (ref 150–400)
RBC: 2.45 MIL/uL — AB (ref 4.22–5.81)
RBC: 2.96 MIL/uL — AB (ref 4.22–5.81)
RBC: 2.8 MIL/uL — AB (ref 4.22–5.81)
RDW: 16.7 % — ABNORMAL HIGH (ref 11.5–15.5)
RDW: 16.5 % — ABNORMAL HIGH (ref 11.5–15.5)
RDW: 17.1 % — ABNORMAL HIGH (ref 11.5–15.5)
WBC: 7.4 10*3/uL (ref 4.0–10.5)
WBC: 9.3 10*3/uL (ref 4.0–10.5)
WBC: 7.3 10*3/uL (ref 4.0–10.5)

## 2017-06-02 LAB — PROTIME-INR
INR: 1.03
INR: 1.03
PROTHROMBIN TIME: 13.4 s (ref 11.4–15.2)
Prothrombin Time: 13.4 seconds (ref 11.4–15.2)

## 2017-06-02 LAB — BASIC METABOLIC PANEL
ANION GAP: 5 (ref 5–15)
ANION GAP: 7 (ref 5–15)
ANION GAP: 4 — AB (ref 5–15)
BUN: 39 mg/dL — AB (ref 6–20)
BUN: 37 mg/dL — AB (ref 6–20)
BUN: 33 mg/dL — ABNORMAL HIGH (ref 6–20)
CALCIUM: 8.1 mg/dL — AB (ref 8.9–10.3)
CHLORIDE: 110 mmol/L (ref 101–111)
CHLORIDE: 110 mmol/L (ref 101–111)
CO2: 21 mmol/L — AB (ref 22–32)
CO2: 18 mmol/L — AB (ref 22–32)
CO2: 22 mmol/L (ref 22–32)
Calcium: 8.2 mg/dL — ABNORMAL LOW (ref 8.9–10.3)
Calcium: 7.9 mg/dL — ABNORMAL LOW (ref 8.9–10.3)
Chloride: 112 mmol/L — ABNORMAL HIGH (ref 101–111)
Creatinine, Ser: 2.35 mg/dL — ABNORMAL HIGH (ref 0.61–1.24)
Creatinine, Ser: 2.1 mg/dL — ABNORMAL HIGH (ref 0.61–1.24)
Creatinine, Ser: 1.85 mg/dL — ABNORMAL HIGH (ref 0.61–1.24)
GFR calc Af Amer: 29 mL/min — ABNORMAL LOW (ref >60)
GFR calc Af Amer: 33 mL/min — ABNORMAL LOW (ref >60)
GFR calc non Af Amer: 33 mL/min — ABNORMAL LOW (ref >60)
GFR, EST AFRICAN AMERICAN: 39 mL/min — AB (ref >60)
GFR, EST NON AFRICAN AMERICAN: 25 mL/min — AB (ref >60)
GFR, EST NON AFRICAN AMERICAN: 29 mL/min — AB (ref >60)
GLUCOSE: 186 mg/dL — AB (ref 65–99)
GLUCOSE: 124 mg/dL — AB (ref 65–99)
GLUCOSE: 86 mg/dL (ref 65–99)
POTASSIUM: 5.8 mmol/L — AB (ref 3.5–5.1)
POTASSIUM: 5.8 mmol/L — AB (ref 3.5–5.1)
POTASSIUM: 5.2 mmol/L — AB (ref 3.5–5.1)
Sodium: 136 mmol/L (ref 135–145)
Sodium: 135 mmol/L (ref 135–145)
Sodium: 138 mmol/L (ref 135–145)

## 2017-06-02 LAB — COMPREHENSIVE METABOLIC PANEL
ALBUMIN: 3.6 g/dL (ref 3.5–5.0)
ALT: 21 U/L (ref 17–63)
ANION GAP: 9 (ref 5–15)
AST: 32 U/L (ref 15–41)
Alkaline Phosphatase: 58 U/L (ref 38–126)
BILIRUBIN TOTAL: 0.8 mg/dL (ref 0.3–1.2)
BUN: 41 mg/dL — ABNORMAL HIGH (ref 6–20)
CO2: 20 mmol/L — ABNORMAL LOW (ref 22–32)
Calcium: 8.9 mg/dL (ref 8.9–10.3)
Chloride: 105 mmol/L (ref 101–111)
Creatinine, Ser: 2.73 mg/dL — ABNORMAL HIGH (ref 0.61–1.24)
GFR, EST AFRICAN AMERICAN: 24 mL/min — AB (ref >60)
GFR, EST NON AFRICAN AMERICAN: 21 mL/min — AB (ref >60)
GLUCOSE: 211 mg/dL — AB (ref 65–99)
POTASSIUM: 5.3 mmol/L — AB (ref 3.5–5.1)
Sodium: 134 mmol/L — ABNORMAL LOW (ref 135–145)
TOTAL PROTEIN: 6.1 g/dL — AB (ref 6.5–8.1)

## 2017-06-02 LAB — TROPONIN I: Troponin I: <0.03 ng/mL (ref <0.03)

## 2017-06-02 LAB — CBG MONITORING, ED: GLUCOSE-CAPILLARY: 227 mg/dL — AB (ref 65–99)

## 2017-06-02 LAB — PREPARE RBC (CROSSMATCH)

## 2017-06-02 LAB — GLUCOSE, CAPILLARY: GLUCOSE-CAPILLARY: 87 mg/dL (ref 65–99)

## 2017-06-02 LAB — I-STAT CG4 LACTIC ACID, ED: LACTIC ACID, VENOUS: 0.73 mmol/L (ref 0.5–1.9)

## 2017-06-02 LAB — APTT: APTT: 25 s (ref 24–36)

## 2017-06-02 LAB — ABO/RH: ABO/RH(D): A POS

## 2017-06-02 MED ORDER — SODIUM CHLORIDE 0.9 % IV BOLUS (SEPSIS)
1000.0000 mL | Freq: Once | INTRAVENOUS | Status: AC
  Administered 2017-06-02: 1000 mL via INTRAVENOUS

## 2017-06-02 MED ORDER — LEVOTHYROXINE SODIUM 100 MCG IV SOLR
37.5000 ug | Freq: Every day | INTRAVENOUS | Status: DC
  Administered 2017-06-03 – 2017-06-04 (×2): 37.5 ug via INTRAVENOUS
  Filled 2017-06-02 (×2): qty 5

## 2017-06-02 MED ORDER — SODIUM CHLORIDE 0.45 % IV SOLN
INTRAVENOUS | Status: DC
  Administered 2017-06-02 – 2017-06-03 (×2): via INTRAVENOUS
  Filled 2017-06-02 (×5): qty 100

## 2017-06-02 MED ORDER — PANTOPRAZOLE SODIUM 40 MG IV SOLR
40.0000 mg | Freq: Every day | INTRAVENOUS | Status: DC
  Administered 2017-06-02 – 2017-06-03 (×2): 40 mg via INTRAVENOUS
  Filled 2017-06-02 (×2): qty 40

## 2017-06-02 MED ORDER — ONDANSETRON HCL 4 MG/2ML IJ SOLN
INTRAMUSCULAR | Status: AC
  Administered 2017-06-02: 4 mg via INTRAVENOUS
  Filled 2017-06-02: qty 2

## 2017-06-02 MED ORDER — LACTATED RINGERS IV SOLN: INTRAVENOUS | Status: DC

## 2017-06-02 MED ORDER — DEXTROSE 50 % IV SOLN
INTRAVENOUS | Status: AC
  Filled 2017-06-02: qty 50

## 2017-06-02 MED ORDER — SODIUM CHLORIDE 0.9 % IV SOLN
10.0000 mL/h | Freq: Once | INTRAVENOUS | Status: AC
  Administered 2017-06-02: 10 mL/h via INTRAVENOUS

## 2017-06-02 MED ORDER — SODIUM CHLORIDE 0.45 % IV SOLN: INTRAVENOUS | Status: DC

## 2017-06-02 MED ORDER — ONDANSETRON HCL 4 MG/2ML IJ SOLN
4.0000 mg | Freq: Once | INTRAMUSCULAR | Status: AC
  Administered 2017-06-02: 4 mg via INTRAVENOUS

## 2017-06-02 NOTE — ED Notes (Signed)
Family at bedside. 

## 2017-06-02 NOTE — Progress Notes (Signed)
Greeted family. Water given to patient, okayed by nurse.  Available for additional support if needed.    06/02/17 1626  Clinical Encounter Type  Visited With Patient and family together  Visit Type Social support

## 2017-06-02 NOTE — ED Provider Notes (Signed)
[place] DEPT Provider Note   CSN: [id number] Arrival date & time: 06/02/17  0910     History   Chief Complaint Chief Complaint  Patient presents with  . GI Bleeding    HPI Randy Mcdaniel. is a 76 y.o. male.  HPI Patient presents with GI bleeding and syncope. History of GI bleeds and recently had internal hemorrhoid banded. Today he had more bleeding with moderate to severe amount of red blood. While waiting in triage she had a syncopal episode pressures in the 70s and was brought back. Mild abdominal pain. No chest pain. He is not on anticoagulation. Reportedly has had hemoglobins down to 6 and required multiple previous transfusions. No injury from the passing out Past Medical History:  Diagnosis Date  . Anxiety   . Depression    recurrent and severe  . Diabetes mellitus    Type II with nephropathy  . GI bleed   . HLD (hyperlipidemia)   . Hypothyroidism   . Internal hemorrhoid   . Osteoarthritis   . Personal history of colonic adenoma 12/03/2012    Patient Active Problem List   Diagnosis Date Noted  . Rectal bleeding 05/17/2017  . Iron deficiency anemia due to chronic blood loss 05/15/2017  . Anemia due to blood loss 05/09/2017  . Acute posthemorrhagic anemia 12/16/2016  . Near syncope 12/16/2016  . Hypotension 12/16/2016  . Prolapsed internal hemorrhoids, grade 3 10/20/2015  . Advance directive discussed with patient 05/22/2015  . Spinal stenosis, lumbar region, with neurogenic claudication 09/17/2014  . Diabetes, polyneuropathy ([place]) 11/15/2013  . Personal history of colonic adenoma 12/03/2012  . Routine general medical examination at a health care facility 10/16/2012  . Constipation 01/10/2011  . B12 DEFICIENCY 08/18/2010  . Type 2 diabetes mellitus with neurological manifestations, controlled ([place]) 02/17/2007  . OSTEOARTHRITIS 02/17/2007  . Hypothyroidism 02/15/2007  . Hyperlipemia 02/15/2007  . Panic anxiety syndrome 02/15/2007  . Depression,  major, recurrent, in partial remission ([place]) 02/15/2007    Past Surgical History:  Procedure Laterality Date  . COLONOSCOPY    . COLONOSCOPY WITH PROPOFOL N/A 12/20/2016   Procedure: COLONOSCOPY WITH PROPOFOL;  Surgeon: Jonathon Bellows, MD;  Location: [facility] ENDOSCOPY;  Service: Endoscopy;  Laterality: N/A;  . ESOPHAGOGASTRODUODENOSCOPY (EGD) WITH PROPOFOL N/A 12/17/2016   Procedure: ESOPHAGOGASTRODUODENOSCOPY (EGD) WITH PROPOFOL;  Surgeon: Wilford Corner, MD;  Location: [facility] ENDOSCOPY;  Service: Endoscopy;  Laterality: N/A;  . FLEXIBLE SIGMOIDOSCOPY N/A 12/17/2016   Procedure: FLEXIBLE SIGMOIDOSCOPY;  Surgeon: Wilford Corner, MD;  Location: [facility] ENDOSCOPY;  Service: Endoscopy;  Laterality: N/A;  . HEMORRHOID BANDING  04/2017  . TOTAL KNEE ARTHROPLASTY  11/09   Right-Dr. Clair Gulling ([facility])  . TOTAL KNEE ARTHROPLASTY  6/10   Left-Dr. Clair Gulling ([facility])  . UMBILICAL HERNIA REPAIR  2000       Home Medications    Prior to Admission medications   Medication Sig Start Date End Date Taking? Authorizing Provider  acetaminophen (TYLENOL) 650 MG CR tablet Take 650 mg by mouth as needed.    [provider]  atorvastatin (LIPITOR) 20 MG tablet TAKE 1 TABLET BY MOUTH ONCE DAILY 05/15/17   Viviana Simpler I, MD  escitalopram (LEXAPRO) 20 MG tablet Take 1 tablet (20 mg total) by mouth every morning. 01/04/17   Venia Carbon, MD  Ferrous Sulfate (IRON SLOW RELEASE PO) Take 625 mg by mouth daily.    [provider]  gabapentin (NEURONTIN) 300 MG capsule TAKE 1 TO 2 CAPSULES BY MOUTH AT BEDTIME 01/05/17  Venia Carbon, MD  glipiZIDE (GLUCOTROL) 5 MG tablet TAKE 1 TABLET BY MOUTH TWICE A DAY BEFORE A MEAL 02/13/17   Viviana Simpler I, MD  glucose blood test strip Dx: 250.40 03/25/11   Venia Carbon, MD  hydrocortisone-pramoxine (ANALPRAM HC) 2.5-1 % rectal cream Place 1 application rectally 2 (two) times daily. 05/18/17   Christene Lye, MD  levothyroxine (SYNTHROID, LEVOTHROID) 75  MCG tablet TAKE 1 TABLET BY MOUTH ONCE A DAY 08/30/16   Viviana Simpler I, MD  lisinopril (PRINIVIL,ZESTRIL) 10 MG tablet TAKE 1 TABLET BY MOUTH ONCE DAILY 04/10/17   Viviana Simpler I, MD  metFORMIN (GLUCOPHAGE) 500 MG tablet TAKE 1 TABLET BY MOUTH 3 TIMES DAILY 05/15/17   Venia Carbon, MD  methylphenidate (RITALIN) 5 MG tablet Take 1 tablet (5 mg total) by mouth 2 (two) times daily. 03/27/17   Venia Carbon, MD  OLANZapine (ZYPREXA) 10 MG tablet TAKE 1 TABLET BY MOUTH AT BEDTIME 12/21/16   Viviana Simpler I, MD  polyethylene glycol (MIRALAX / GLYCOLAX) packet Take 17 g by mouth daily.    [provider]  vitamin B-12 (CYANOCOBALAMIN) 1000 MCG tablet Take 1,000 mcg by mouth daily.      [provider]    Family History Family History  Problem Relation Age of Onset  . Heart failure Father   . Diabetes Father   . Depression Father   . Coronary artery disease Father   . Breast cancer Sister 56  . Lung cancer Sister   . Depression Sister   . Stomach cancer Mother 35  . Stroke Unknown        Paternal side  . Parkinsonism Unknown        Cousin  . Colon cancer Neg Hx   . Rectal cancer Neg Hx   . Esophageal cancer Neg Hx   . Colon polyps Neg Hx     Social History Social History  Substance Use Topics  . Smoking status: Never Smoker  . Smokeless tobacco: Former Systems developer    Types: Chew    Quit date: 05/15/2002  . Alcohol use No     Allergies   Vytorin [ezetimibe-simvastatin]   Review of Systems Review of Systems  Constitutional: Positive for appetite change.  HENT: Negative for congestion.   Respiratory: Negative for shortness of breath.   Cardiovascular: Negative for chest pain.  Gastrointestinal: Positive for anal bleeding and blood in stool.  Genitourinary: Negative for flank pain.  Musculoskeletal: Negative for back pain.  Neurological: Positive for syncope and light-headedness.  Hematological: Negative for adenopathy.  Psychiatric/Behavioral:  Negative for confusion.     Physical Exam Updated Vital Signs BP 101/69   Pulse 84   Temp (!) 97.1 F (36.2 C) (Tympanic)   Resp (!) 21   Ht 5\' 10"  (1.778 m)   Wt 88.5 kg (195 lb)   SpO2 99%   BMI 27.98 kg/m   Physical Exam  Constitutional: He appears well-developed.  HENT:  Head: Atraumatic.  Eyes: EOM are normal.  Neck: Neck supple.  Cardiovascular: Normal rate.   Pulmonary/Chest: Effort normal. He has no wheezes.  Abdominal: There is tenderness.  Mild lower abdominal tenderness without rebound or guarding.  Musculoskeletal: He exhibits no edema.  Neurological: He is alert.  Skin: There is pallor.     ED Treatments / Results  Labs (all labs ordered are listed, but only abnormal results are displayed) Labs Reviewed  COMPREHENSIVE METABOLIC PANEL - Abnormal; Notable for the following:  Result Value   Sodium 134 (*)    Potassium 5.3 (*)    CO2 20 (*)    Glucose, Bld 211 (*)    BUN 41 (*)    Creatinine, Ser 2.73 (*)    Total Protein 6.1 (*)    GFR calc non Af Amer 21 (*)    GFR calc Af Amer 24 (*)    All other components within normal limits  CBC - Abnormal; Notable for the following:    RBC 2.45 (*)    Hemoglobin 7.1 (*)    HCT 23.1 (*)    RDW 16.7 (*)    All other components within normal limits  CBG MONITORING, ED - Abnormal; Notable for the following:    Glucose-Capillary 227 (*)    All other components within normal limits  TROPONIN I  PROTIME-INR  APTT  CBC  CBC  CBC  BASIC METABOLIC PANEL  BASIC METABOLIC PANEL  BASIC METABOLIC PANEL  POC OCCULT BLOOD, ED  I-STAT CG4 LACTIC ACID, ED  TYPE AND SCREEN  PREPARE RBC (CROSSMATCH)  ABO/RH    EKG  EKG Interpretation None       Radiology Dg Abd Portable 1v  Result Date: 06/02/2017 CLINICAL DATA:  Gastrointestinal bleeding EXAM: PORTABLE ABDOMEN - 1 VIEW COMPARISON:  None. FINDINGS: Bowel gas pattern does not show evidence of ileus or obstruction. No abnormal calcifications. Chronic  spinal degenerative changes. IMPRESSION: Negative radiographs. No finding specific 2 gastrointestinal bleeding. Electronically Signed   By: Nelson Chimes M.D.   On: 06/02/2017 11:55    Procedures Procedures (including critical care time)  Medications Ordered in ED Medications  0.9 %  sodium chloride infusion (not administered)  sodium chloride 0.9 % bolus 1,000 mL (0 mLs Intravenous Stopped 06/02/17 1052)  ondansetron (ZOFRAN) injection 4 mg (4 mg Intravenous Given 06/02/17 1109)  sodium chloride 0.9 % bolus 1,000 mL (0 mLs Intravenous Stopped 06/02/17 1231)     Initial Impression / Assessment and Plan / ED Course  I have reviewed the triage vital signs and the nursing notes.  Pertinent labs & imaging results that were available during my care of the patient were reviewed by me and considered in my medical decision making (see chart for details).     Patient presented after GI bleeding. Found to have hemoglobin of 7 which will likely go lower. Compared to transfuse 2 units to the hypotension and increased kidney function. Blood pressure improved some but still hypotensive after 2 L of fluid and 2 units of blood. Had been seen previously by ICU general surgery and GI in the ER. Will admit to ICU.   CRITICAL CARE Performed by: Mackie Pai Total critical care time: 30 minutes Critical care time was exclusive of separately billable procedures and treating other patients. Critical care was necessary to treat or prevent imminent or life-threatening deterioration. Critical care was time spent personally by me on the following activities: development of treatment plan with patient and/or surrogate as well as nursing, discussions with consultants, evaluation of patient's response to treatment, examination of patient, obtaining history from patient or surrogate, ordering and performing treatments and interventions, ordering and review of laboratory studies, ordering and review of radiographic  studies, pulse oximetry and re-evaluation of patient's condition.   Final Clinical Impressions(s) / ED Diagnoses   Final diagnoses:  Lower GI bleed  Symptomatic anemia  Hemorrhagic shock (HCC)    New Prescriptions New Prescriptions   No medications on file     Davonna Belling,  MD 06/02/17 1237

## 2017-06-02 NOTE — ED Notes (Signed)
Pt brought in trauma bay due to having a loss of consciousness in lobby, Pt alert and ox4. Pt appears pales, MD Pickering a bedside.

## 2017-06-02 NOTE — ED Notes (Signed)
Critical care at bed side to speak with family.

## 2017-06-02 NOTE — ED Notes (Signed)
GI PA at bedside. 

## 2017-06-02 NOTE — ED Notes (Signed)
The pt was checked for rectal bleeding  None seen  Just some sl smears over the diaper in two to three areas  No active bleeding

## 2017-06-02 NOTE — ED Notes (Signed)
Report called to melanie rn on 53m

## 2017-06-02 NOTE — Telephone Encounter (Signed)
-----   Message from Dominga Ferry, [place] sent at 06/01/2017  4:58 PM EDT ----- Please call patient with pre-admit appt date and time once the hospital schedules. Thanks.

## 2017-06-02 NOTE — Consult Note (Addendum)
Name: Randy Mcdaniel Harless Litten. MRN: [id number] DOB: [birth year]    ADMISSION DATE:  06/02/2017 CONSULTATION DATE:  10/5  REFERRING MD :  Dr. Alvino Chapel  CHIEF COMPLAINT:  GI bleeding  HISTORY OF PRESENT ILLNESS:  76 year old male with PMH as below, which is significant for DM, HLD, and recurrent lower GI bleed secondary to internal hemorrhoids. He is followed by Dr. Carlean Purl and has been admitted multiple times for blood transfusion. He has had hemorrhoids banded several times and continues to have bleeding. He was sent for general surgery evaluation 10/4 and was scheduled for a hemorrhoidectomy and proctoscopy in the near future. 10/5 in the AM hours he developed recurrent GI bleeding that was uncontrollable at home and he presented to [facility] ED. He suffered a syncopal event in the ED lobby and was brought to room where he was found to have BRBPR with clots. He was hypotensive and pale with low hemoglobin (7.1). He was ordered PRBC transfusion and PCCM was called.   SIGNIFICANT EVENTS    STUDIES:    PAST MEDICAL HISTORY :   has a past medical history of Anxiety; Depression; Diabetes mellitus; GI bleed; HLD (hyperlipidemia); Hypothyroidism; Internal hemorrhoid; Osteoarthritis; and Personal history of colonic adenoma (12/03/2012).  has a past surgical history that includes Umbilical hernia repair (2000); Total knee arthroplasty (11/09); Total knee arthroplasty (6/10); Colonoscopy; Hemorrhoid banding (04/2017); Esophagogastroduodenoscopy (egd) with propofol (N/A, 12/17/2016); Flexible sigmoidoscopy (N/A, 12/17/2016); and Colonoscopy with propofol (N/A, 12/20/2016). Prior to Admission medications   Medication Sig Start Date End Date Taking? Authorizing Provider  acetaminophen (TYLENOL) 650 MG CR tablet Take 650 mg by mouth as needed.    [provider]  atorvastatin (LIPITOR) 20 MG tablet TAKE 1 TABLET BY MOUTH ONCE DAILY 05/15/17   Viviana Simpler I, MD  escitalopram (LEXAPRO) 20 MG tablet  Take 1 tablet (20 mg total) by mouth every morning. 01/04/17   Venia Carbon, MD  Ferrous Sulfate (IRON SLOW RELEASE PO) Take 625 mg by mouth daily.    [provider]  gabapentin (NEURONTIN) 300 MG capsule TAKE 1 TO 2 CAPSULES BY MOUTH AT BEDTIME 01/05/17   Viviana Simpler I, MD  glipiZIDE (GLUCOTROL) 5 MG tablet TAKE 1 TABLET BY MOUTH TWICE A DAY BEFORE A MEAL 02/13/17   Viviana Simpler I, MD  glucose blood test strip Dx: 250.40 03/25/11   Venia Carbon, MD  hydrocortisone-pramoxine (ANALPRAM HC) 2.5-1 % rectal cream Place 1 application rectally 2 (two) times daily. 05/18/17   Christene Lye, MD  levothyroxine (SYNTHROID, LEVOTHROID) 75 MCG tablet TAKE 1 TABLET BY MOUTH ONCE A DAY 08/30/16   Viviana Simpler I, MD  lisinopril (PRINIVIL,ZESTRIL) 10 MG tablet TAKE 1 TABLET BY MOUTH ONCE DAILY 04/10/17   Viviana Simpler I, MD  metFORMIN (GLUCOPHAGE) 500 MG tablet TAKE 1 TABLET BY MOUTH 3 TIMES DAILY 05/15/17   Venia Carbon, MD  methylphenidate (RITALIN) 5 MG tablet Take 1 tablet (5 mg total) by mouth 2 (two) times daily. 03/27/17   Venia Carbon, MD  OLANZapine (ZYPREXA) 10 MG tablet TAKE 1 TABLET BY MOUTH AT BEDTIME 12/21/16   Viviana Simpler I, MD  polyethylene glycol (MIRALAX / GLYCOLAX) packet Take 17 g by mouth daily.    [provider]  vitamin B-12 (CYANOCOBALAMIN) 1000 MCG tablet Take 1,000 mcg by mouth daily.      [provider]   Allergies  Allergen Reactions  . Vytorin [Ezetimibe-Simvastatin]     REACTION:  nausea    FAMILY HISTORY:  family history includes Breast cancer (age of onset: 21) in his sister; Coronary artery disease in his father; Depression in his father and sister; Diabetes in his father; Heart failure in his father; Lung cancer in his sister; Parkinsonism in his unknown relative; Stomach cancer (age of onset: 81) in his mother; Stroke in his unknown relative. SOCIAL HISTORY:  reports that he has never smoked. He quit smokeless  tobacco use about 15 years ago. His smokeless tobacco use included Chew. He reports that he does not drink alcohol or use drugs.  REVIEW OF SYSTEMS:   Bolds are positive  Constitutional: weight loss, gain, night sweats, Fevers, chills, fatigue .  HEENT: headaches, Sore throat, sneezing, nasal congestion, post nasal drip, Difficulty swallowing, Tooth/dental problems, visual complaints visual changes, ear ache CV:  chest pain, radiates:,Orthopnea, PND, swelling in lower extremities, dizziness, palpitations, syncope.  GI  heartburn, indigestion, abdominal pain - periumbilical, nausea, vomiting, diarrhea, change in bowel habits, loss of appetite, bloody stools.  Resp: cough, productive: , hemoptysis, dyspnea, chest pain, pleuritic.  Skin: rash or itching or icterus GU: dysuria, change in color of urine, urgency or frequency. flank pain, hematuria  MS: joint pain or swelling. decreased range of motion  Psych: change in mood or affect. depression or anxiety.  Neuro: difficulty with speech, weakness, numbness, ataxia    SUBJECTIVE:   VITAL SIGNS: Temp:  [97.1 F (36.2 C)-98 F (36.7 C)] 97.1 F (36.2 C) (10/05 1053) Pulse Rate:  [77-98] 84 (10/05 1053) Resp:  [16-33] 16 (10/05 1053) BP: (72-104)/(48-92) 89/48 (10/05 1053) SpO2:  [93 %-100 %] 100 % (10/05 1045) Weight:  [88.5 kg (195 lb)] 88.5 kg (195 lb) (10/05 0932)  PHYSICAL EXAMINATION: General:  Elderly male of normal body habitus in NAD Neuro:  Alert, oriented, non-focal. Follows commands.  HEENT:  [place]/AT, PERRL, no JVD Cardiovascular:  Tachy, regular, no MRG Lungs:  Clear Abdomen:  Diffusely tender, soft.  Musculoskeletal:  No acute deformity or ROM limitation Skin:  Grossly intact   Recent Labs Lab 06/02/17 0925  NA 134*  K 5.3*  CL 105  CO2 20*  BUN 41*  CREATININE 2.73*  GLUCOSE 211*    Recent Labs Lab 06/02/17 0925  HGB 7.1*  HCT 23.1*  WBC 7.4  PLT 327   No results found.   ASSESSMENT / PLAN:  Acute  blood loss anemia secondary to internal hemorrhoids - Coags - 2 units PRBC now - GI and General surgery consulted by EDP - CBC q 4 hours - KUB  Hemorrhagic shock secondary to above - MAP goal > 65 mmHg - Blood products for resuscitation - Pressors if needed, however, favor blood.  - Check lactic  Hyperkalemia AKI in setting shock - BMP q 4 hours given need for blood transfusion - Transfuse as above  HLD, HTN - holding statin, lisinopril while NPO  DM - CBG monitoring and SSI  Georgann Housekeeper, AGACNP-BC [place] [facility] Pager [phone] or [phone]  06/02/2017 11:28 AM   STAFF NOTE: Linwood Dibbles, MD FACP have personally reviewed patient's available data, including medical history, events of note, physical examination and test results as part of my evaluation. I have discussed with resident/NP and other care providers such as pharmacist, RN and RRT. In addition, I personally evaluated patient and elicited key findings of: awake flat in er bed, lungs clear, low jvd, abdo soft , non tender to palpation but complains of mild epigastric pain, BS present,  rectal exam noted by ER, no edema, no pcxr as of now, he presents with BRBPR  And anemia, he has MAP 56 and concern is internal hemm bleed, scope in 2016 by Dr Carlean Purl and recent with [facility], he needs stat 2 units prbc, bolus now, get lactic acid, stat coags needed, would have surgical consult, consider tamponade, get GI consult, cbc to follow post transfusion, will follow with you over next hour and assess clinical status and MAP then to decide on triage needs, given the refractory nature he likley needs sirgical intervention, I updated pt, wife and daughter I am concerned about hyperkalemia with atn from shock and need for PRBC - k will only rise, may need bicarb, repeat bmet frequent The patient is critically ill with multiple organ systems failure and requires high complexity decision making for assessment  and support, frequent evaluation and titration of therapies, application of advanced monitoring technologies and extensive interpretation of multiple databases.   Critical Care Time devoted to patient care services described in this note is 35 Minutes. This time reflects time of care of this signee: Merrie Roof, MD FACP. This critical care time does not reflect procedure time, or teaching time or supervisory time of PA/NP/Med student/Med Resident etc but could involve care discussion time. Rest per NP/medical resident whose note is outlined above and that I agree with   Lavon Paganini. Titus Mould, MD, [place] Pgr: [facility] 06/02/2017 1:53 PM

## 2017-06-02 NOTE — ED Notes (Signed)
Critical Care MD returned call & updated on pts BP, MD to place orders for admission

## 2017-06-02 NOTE — ED Triage Notes (Signed)
Per Pt and Family, pt is coming from home with complaints of GI Bleeding that re-occured today. Pt has hx of gi bleeding in the last couple months. Family was with him this morning when he started to have some bright red blood with dark clots noted coming from his rectum. Denies any abdominal pain.

## 2017-06-02 NOTE — ED Notes (Signed)
ED Provider at bedside. 

## 2017-06-02 NOTE — Telephone Encounter (Signed)
Called answering service to report to cancel lab appointment today, he is going to ER. Did not say why.

## 2017-06-02 NOTE — ED Notes (Signed)
EDP Tyrone Nine informed of sys BP 79, verbal order for NS bolus, GI MD at bedside

## 2017-06-02 NOTE — ED Notes (Signed)
Attempted report 

## 2017-06-02 NOTE — Consult Note (Signed)
[facility] Surgery Consult Note  Randy Mcdaniel. [birth year]  [id number].    Requesting MD: Davonna Belling Chief Complaint/Reason for Consult: Rectal bleeding  HPI:  Randy Mashek. is a 76yo male with known h/o internal hemorrhoids, who presented to [facility] earlier this morning with worsening bright red blood per rectum. Wife at bedside. States that he has had intermittent bleeding from hemorrhoids for 2 years, worse and more frequent over the last 4-6 months. The bleeding has never fully stopped, it just gets less/more depending on the day. He has been seeing Dr. Carlean Purl and had them banded 4 times, most recently about 3 weeks ago. States that he was referred to a general surgeon, Dr. Jamal Collin, and is scheduled for an internal hemorrhoidectomy and proctoscopy on 06/14/17.  This morning he had heavier rectal bleeding and decided to come to the ED. He has been to [facility] 3 other times requiring blood transfusions due to acute blood loss anemia. He has also been receiving iron transfusions at the cancer center. While in the ED lobby patient passed out and was brought into the trauma bay. Currently alert and awake, hypotensive 89/48, and receiving PRBC. He is nauseated but denies any abdominal or rectal pain. Last BM was this AM. Typically has 2-3 BMs daily and it is hard. Last meal about 8AM this morning.  - PMH significant for DM, HTN, HLD, hypothyroidism, depression/anxiety - Abdominal surgical history: umbilical hernia repair several years ago - Last sigmoidoscopy performed 12/20/16 for bleeding and showed internal hemorrhoids, no active bleeding - Last full colonoscopy 10/20/2015 showed diverticulosis in the left colon and internal hemorrhoids, otherwise normal - Anticoagulants: none - Nonsmoker - Lives at home with wife  ROS: Review of Systems  Constitutional: Positive for malaise/fatigue.  HENT: Negative.   Eyes: Negative.   Respiratory: Negative.    Cardiovascular: Negative.   Gastrointestinal: Positive for blood in stool and nausea. Negative for abdominal pain and vomiting.  Genitourinary: Negative.   Musculoskeletal: Negative.   Skin: Negative.   Neurological: Negative.   All systems reviewed and otherwise negative except for as above  Family History  Problem Relation Age of Onset  . Heart failure Father   . Diabetes Father   . Depression Father   . Coronary artery disease Father   . Breast cancer Sister 68  . Lung cancer Sister   . Depression Sister   . Stomach cancer Mother 49  . Stroke Unknown        Paternal side  . Parkinsonism Unknown        Cousin  . Colon cancer Neg Hx   . Rectal cancer Neg Hx   . Esophageal cancer Neg Hx   . Colon polyps Neg Hx     Past Medical History:  Diagnosis Date  . Anxiety   . Depression    recurrent and severe  . Diabetes mellitus    Type II with nephropathy  . GI bleed   . HLD (hyperlipidemia)   . Hypothyroidism   . Internal hemorrhoid   . Osteoarthritis   . Personal history of colonic adenoma 12/03/2012    Past Surgical History:  Procedure Laterality Date  . COLONOSCOPY    . COLONOSCOPY WITH PROPOFOL N/A 12/20/2016   Procedure: COLONOSCOPY WITH PROPOFOL;  Surgeon: Jonathon Bellows, MD;  Location[facility] ENDOSCOPY;  Service: Endoscopy;  Laterality: N/A;  . ESOPHAGOGASTRODUODENOSCOPY (EGD) WITH PROPOFOL N/A 12/17/2016   Procedure: ESOPHAGOGASTRODUODENOSCOPY (EGD) WITH PROPOFOL;  Surgeon: Wilford Corner, MD;  Location: [facility] ENDOSCOPY;  Service: Endoscopy;  Laterality: N/A;  . FLEXIBLE SIGMOIDOSCOPY N/A 12/17/2016   Procedure: FLEXIBLE SIGMOIDOSCOPY;  Surgeon: Wilford Corner, MD;  Location[facility] ENDOSCOPY;  Service: Endoscopy;  Laterality: N/A;  . HEMORRHOID BANDING  04/2017  . TOTAL KNEE ARTHROPLASTY  11/09   Right-Dr. Clair Gulling ([facility])  . TOTAL KNEE ARTHROPLASTY  6/10   Left-Dr. Clair Gulling ([facility])  . UMBILICAL HERNIA REPAIR  2000    Social History:  reports that he has never smoked.  He quit smokeless tobacco use about 15 years ago. His smokeless tobacco use included Chew. He reports that he does not drink alcohol or use drugs.  Allergies:  Allergies  Allergen Reactions  . Vytorin [Ezetimibe-Simvastatin]     REACTION: nausea     (Not in a hospital admission)  Prior to Admission medications   Medication Sig Start Date End Date Taking? Authorizing Provider  acetaminophen (TYLENOL) 650 MG CR tablet Take 650 mg by mouth as needed.    [provider]  atorvastatin (LIPITOR) 20 MG tablet TAKE 1 TABLET BY MOUTH ONCE DAILY 05/15/17   Viviana Simpler I, MD  escitalopram (LEXAPRO) 20 MG tablet Take 1 tablet (20 mg total) by mouth every morning. 01/04/17   Venia Carbon, MD  Ferrous Sulfate (IRON SLOW RELEASE PO) Take 625 mg by mouth daily.    [provider]  gabapentin (NEURONTIN) 300 MG capsule TAKE 1 TO 2 CAPSULES BY MOUTH AT BEDTIME 01/05/17   Viviana Simpler I, MD  glipiZIDE (GLUCOTROL) 5 MG tablet TAKE 1 TABLET BY MOUTH TWICE A DAY BEFORE A MEAL 02/13/17   Viviana Simpler I, MD  glucose blood test strip Dx: 250.40 03/25/11   Venia Carbon, MD  hydrocortisone-pramoxine (ANALPRAM HC) 2.5-1 % rectal cream Place 1 application rectally 2 (two) times daily. 05/18/17   Christene Lye, MD  levothyroxine (SYNTHROID, LEVOTHROID) 75 MCG tablet TAKE 1 TABLET BY MOUTH ONCE A DAY 08/30/16   Viviana Simpler I, MD  lisinopril (PRINIVIL,ZESTRIL) 10 MG tablet TAKE 1 TABLET BY MOUTH ONCE DAILY 04/10/17   Viviana Simpler I, MD  metFORMIN (GLUCOPHAGE) 500 MG tablet TAKE 1 TABLET BY MOUTH 3 TIMES DAILY 05/15/17   Venia Carbon, MD  methylphenidate (RITALIN) 5 MG tablet Take 1 tablet (5 mg total) by mouth 2 (two) times daily. 03/27/17   Venia Carbon, MD  OLANZapine (ZYPREXA) 10 MG tablet TAKE 1 TABLET BY MOUTH AT BEDTIME 12/21/16   Viviana Simpler I, MD  polyethylene glycol (MIRALAX / GLYCOLAX) packet Take 17 g by mouth daily.    [provider]  vitamin  B-12 (CYANOCOBALAMIN) 1000 MCG tablet Take 1,000 mcg by mouth daily.      [provider]    Blood pressure (!) 89/48, pulse 84, temperature (!) 97.1 F (36.2 C), temperature source Tympanic, resp. rate 16, height '5\' 10"'  (1.778 m), weight 195 lb (88.5 kg), SpO2 100 %. Physical Exam: General: pleasant, pale, WD/WN white male who is siting up in bed in NAD HEENT: head is normocephalic, atraumatic.  Sclera are noninjected.  Pupils equal and round.  Ears and nose without any masses or lesions.  Mouth is pink and moist. Dentition fair Heart: regular, rate, and rhythm.  No obvious murmurs, gallops, or rubs noted.  Palpable pedal pulses bilaterally Lungs: CTAB, no wheezes, rhonchi, or rales noted.  Respiratory effort nonlabored Abd: soft, NT/ND, +BS, no masses, hernias, or organomegaly MS: all 4 extremities are symmetrical with no cyanosis, clubbing, or edema. Skin:  warm and dry with no masses, lesions, or rashes Psych: A&Ox3 with an appropriate affect. Neuro: cranial nerves grossly intact, extremity CSM intact bilaterally, normal speech  Results for orders placed or performed during the hospital encounter of 06/02/17 (from the past 48 hour(s))  Comprehensive metabolic panel     Status: Abnormal   Collection Time: 06/02/17  9:25 AM  Result Value Ref Range   Sodium 134 (L) 135 - 145 mmol/L   Potassium 5.3 (H) 3.5 - 5.1 mmol/L   Chloride 105 101 - 111 mmol/L   CO2 20 (L) 22 - 32 mmol/L   Glucose, Bld 211 (H) 65 - 99 mg/dL   BUN 41 (H) 6 - 20 mg/dL   Creatinine, Ser 2.73 (H) 0.61 - 1.24 mg/dL   Calcium 8.9 8.9 - 10.3 mg/dL   Total Protein 6.1 (L) 6.5 - 8.1 g/dL   Albumin 3.6 3.5 - 5.0 g/dL   AST 32 15 - 41 U/L   ALT 21 17 - 63 U/L   Alkaline Phosphatase 58 38 - 126 U/L   Total Bilirubin 0.8 0.3 - 1.2 mg/dL   GFR calc non Af Amer 21 (L) >60 mL/min   GFR calc Af Amer 24 (L) >60 mL/min    Comment: (NOTE) The eGFR has been calculated using the CKD EPI equation. This calculation has  not been validated in all clinical situations. eGFR's persistently <60 mL/min signify possible Chronic Kidney Disease.    Anion gap 9 5 - 15  CBC     Status: Abnormal   Collection Time: 06/02/17  9:25 AM  Result Value Ref Range   WBC 7.4 4.0 - 10.5 K/uL   RBC 2.45 (L) 4.22 - 5.81 MIL/uL   Hemoglobin 7.1 (L) 13.0 - 17.0 g/dL   HCT 23.1 (L) 39.0 - 52.0 %   MCV 94.3 78.0 - 100.0 fL   MCH 29.0 26.0 - 34.0 pg   MCHC 30.7 30.0 - 36.0 g/dL   RDW 16.7 (H) 11.5 - 15.5 %   Platelets 327 150 - 400 K/uL  Type and screen [place]     Status: None (Preliminary result)   Collection Time: 06/02/17  9:25 AM  Result Value Ref Range   ABO/RH(D) A POS    Antibody Screen NEG    Sample Expiration 06/05/2017    Unit Number [id number]    Blood Component Type RED CELLS,LR    Unit division 00    Status of Unit ISSUED    Transfusion Status OK TO TRANSFUSE    Crossmatch Result Compatible    Unit Number [id number]    Blood Component Type RBC LR PHER1    Unit division 00    Status of Unit ISSUED    Transfusion Status OK TO TRANSFUSE    Crossmatch Result Compatible   ABO/Rh     Status: None (Preliminary result)   Collection Time: 06/02/17  9:25 AM  Result Value Ref Range   ABO/RH(D) A POS   CBG monitoring, ED     Status: Abnormal   Collection Time: 06/02/17 10:09 AM  Result Value Ref Range   Glucose-Capillary 227 (H) 65 - 99 mg/dL  Prepare RBC     Status: None   Collection Time: 06/02/17 10:20 AM  Result Value Ref Range   Order Confirmation ORDER PROCESSED BY BLOOD BANK   Protime-INR     Status: None   Collection Time: 06/02/17 10:21 AM  Result Value Ref Range   Prothrombin Time 13.4 11.4 -  15.2 seconds   INR 1.03    No results found.    Assessment/Plan DM HTN, HLD Hypothyroidism Ddepression/anxiety  Acute blood loss anemia Bleeding internal hemorrhoids - present x2 years, worse/more frequent bleeding requiring blood and iron transfusion x4-6 months - has  had them banded about 4 times by Dr. Carlean Purl but continues to have bleeding - seen by Dr. Jamal Collin as outpatient and is scheduled for an internal hemorrhoidectomy and proctoscopy on 06/14/17  Plan - Admit to CCM. Blood transfusions PRN per primary. Surgifoam inserted into rectum by Dr. Donne Hazel at bedside to help control bleeding. GI to see patient. Will continue to follow along.   Wellington Hampshire, [facility] Surgery 06/02/2017, 11:13 AM Pager: [phone] Consults: [phone] Mon-Fri 7:00 am-4:30 pm Sat-Sun 7:00 am-11:30 am

## 2017-06-02 NOTE — ED Notes (Signed)
Pt receiving first unit of blood via [place] warmer.

## 2017-06-02 NOTE — ED Notes (Signed)
Critical care paged d/t BP 88 sys after 1000 mL NS

## 2017-06-02 NOTE — ED Notes (Signed)
Paged [facility] to [employer]

## 2017-06-02 NOTE — Consult Note (Signed)
[facility] Consult: 1:37 PM 06/02/2017  LOS: 0 days    Referring Provider: Dr Alvino Chapel  Primary Care Physician:  Venia Carbon, MD Primary Gastroenterologist:  Dr Silvano Rusk     Reason for Consultation:  Hematochezia and anemia.     HPI: Randy Mcdaniel. is a 76 y.o. male.  PMH.  Chronic anemia.  Spinal stenosis.  Hypothyroidism.  DM 2, on oral agents.      Recurrent hematochezia and associated anemia.  Most recent admission to [facility] in 11/2016 for this. Subsequent office-based hemorrhoidal banding in June and September 2018.  11/2012 avg risk screening Colonoscopy.  Dr Carlean Purl.  Large internal hemorrhoids, not bleeding.  Removed four, 3-8 mm, sessile polyps.  Path: adenomas. 09/2015 Colonoscopy.  Dr Carlean Purl.  For incontinence.  Grade 2 internal hemorrhoids, left sided diverticulosis.  MD suggested hemorrhoidal banding might help fecal incontinence.  12/17/2016 unprepped Flex Sig for rectal bleeding.  Dr Michail Sermon at [facility]. No bleeding.  Small internal hemorrhoids.  Suspected diverticular bleed.  12/17/2016 EGD.  For hematochezia. Dr Michail Sermon.  A few, non-bleeding duodenal erosions.   12/20/2016 Colonoscopy attempt for rectal bleeding.  Dr Guido Sander in [place].  Poor prep, retained stool in rectum and above, study aborted.  Small, non-bleeding internal hemorrhoids.   Has undergone hemorrhoidal banding by Dr Carlean Purl in 12/2015, 01/2017, 05/08/17.  Anoscopy at GI visit 05/08/17 showed grade 2 inflamed hemorrhoids. Dr Carlean Purl proceeded with repeat banding of RP and LL internal hemorrhoid that day.  Patient's most recent admissions for hematochezia or in April 2018 and 05/09/17, 1 day post hemorrhoidal banding.  He received blood transfusions during both admissions.  Since hemorrhoidal banding almost a month  ago, the patient has only seen minor blood with wiping. He has not seen blood dripping into the commode. He doesn't take any aspirin or NSAIDs because of his history of GI bleeds and anemia. Pt on chronic po iron 1 x daily.  Hgb to 6.6 05/09/17.  Up to 9.9 on 9/17.  Ranges of 7.7 to 8.8 in April and May 2018.   This morning for about an hour, starting around 7 AM, the patient passed red blood repeatedly. This was kind of a burgundy and bright red color and was associated with clotting. He filled up the commode a few times. There was no associated abdominal pain or nausea. His wife brought him to the ED and while he was sitting in the waiting room, awaiting bed availability, he had a syncopal spell. His wife said he got clammy, cold, diaphoretic and then was unresponsive. He was then was away to a trauma bed where he was evaluated. Hgb 7.1.  MCV normal.  Received 2 units of packed red cells and repeat hgb is 8.8.   AKI.    General surgery, Dr Donne Hazel saw pt and is "not entirely sure this is hemorrhoidal bleeding although historically certainly could be.  This could also be diverticular bleeding". On exam he noted grade 3 internal hemorrhoids with some external hemorrhoids as well. However there was no blood  on DRE. He did prophylactically insert Surgifoam into the rectum He will consider hermorrhoidectomy but wishes for "GI evaluation as I think would be reasonable to rule out higher source of his bleeding".    Patient has had no further bleeding since the occurrences earlier this morning.  Has never had bleeding study (bleeding scan or CT angio).   Patient denies nausea, vomiting. Occasionally has abdominal pain but this is not associated with the GI bleeding and he has had none today.  Past Medical History:  Diagnosis Date  . Anxiety   . Depression    recurrent and severe  . Diabetes mellitus    Type II with nephropathy  . GI bleed   . HLD (hyperlipidemia)   . Hypothyroidism   . Internal  hemorrhoid   . Osteoarthritis   . Personal history of colonic adenoma 12/03/2012    Past Surgical History:  Procedure Laterality Date  . COLONOSCOPY    . COLONOSCOPY WITH PROPOFOL N/A 12/20/2016   Procedure: COLONOSCOPY WITH PROPOFOL;  Surgeon: Jonathon Bellows, MD;  Location: [facility] ENDOSCOPY;  Service: Endoscopy;  Laterality: N/A;  . ESOPHAGOGASTRODUODENOSCOPY (EGD) WITH PROPOFOL N/A 12/17/2016   Procedure: ESOPHAGOGASTRODUODENOSCOPY (EGD) WITH PROPOFOL;  Surgeon: Wilford Corner, MD;  Location: [facility] ENDOSCOPY;  Service: Endoscopy;  Laterality: N/A;  . FLEXIBLE SIGMOIDOSCOPY N/A 12/17/2016   Procedure: FLEXIBLE SIGMOIDOSCOPY;  Surgeon: Wilford Corner, MD;  Location: [facility] ENDOSCOPY;  Service: Endoscopy;  Laterality: N/A;  . HEMORRHOID BANDING  04/2017  . TOTAL KNEE ARTHROPLASTY  11/09   Right-Dr. Clair Gulling ([facility])  . TOTAL KNEE ARTHROPLASTY  6/10   Left-Dr. Clair Gulling ([facility])  . UMBILICAL HERNIA REPAIR  2000    Prior to Admission medications   Medication Sig Start Date End Date Taking? Authorizing Provider  acetaminophen (TYLENOL) 650 MG CR tablet Take 650 mg by mouth as needed.    [provider]  atorvastatin (LIPITOR) 20 MG tablet TAKE 1 TABLET BY MOUTH ONCE DAILY 05/15/17   Viviana Simpler I, MD  escitalopram (LEXAPRO) 20 MG tablet Take 1 tablet (20 mg total) by mouth every morning. 01/04/17   Venia Carbon, MD  Ferrous Sulfate (IRON SLOW RELEASE PO) Take 625 mg by mouth daily.    [provider]  gabapentin (NEURONTIN) 300 MG capsule TAKE 1 TO 2 CAPSULES BY MOUTH AT BEDTIME 01/05/17   Viviana Simpler I, MD  glipiZIDE (GLUCOTROL) 5 MG tablet TAKE 1 TABLET BY MOUTH TWICE A DAY BEFORE A MEAL 02/13/17   Viviana Simpler I, MD  glucose blood test strip Dx: 250.40 03/25/11   Venia Carbon, MD  hydrocortisone-pramoxine (ANALPRAM HC) 2.5-1 % rectal cream Place 1 application rectally 2 (two) times daily. 05/18/17   Christene Lye, MD  levothyroxine (SYNTHROID, LEVOTHROID) 75 MCG  tablet TAKE 1 TABLET BY MOUTH ONCE A DAY 08/30/16   Viviana Simpler I, MD  lisinopril (PRINIVIL,ZESTRIL) 10 MG tablet TAKE 1 TABLET BY MOUTH ONCE DAILY 04/10/17   Viviana Simpler I, MD  metFORMIN (GLUCOPHAGE) 500 MG tablet TAKE 1 TABLET BY MOUTH 3 TIMES DAILY 05/15/17   Venia Carbon, MD  methylphenidate (RITALIN) 5 MG tablet Take 1 tablet (5 mg total) by mouth 2 (two) times daily. 03/27/17   Venia Carbon, MD  OLANZapine (ZYPREXA) 10 MG tablet TAKE 1 TABLET BY MOUTH AT BEDTIME 12/21/16   Viviana Simpler I, MD  polyethylene glycol (MIRALAX / GLYCOLAX) packet Take 17 g by mouth daily.    [provider]  vitamin B-12 (CYANOCOBALAMIN) 1000 MCG  tablet Take 1,000 mcg by mouth daily.      [provider]    Scheduled Meds:  Infusions: . sodium chloride     PRN Meds:    Allergies as of 06/02/2017 - Review Complete 06/02/2017  Allergen Reaction Noted  . Vytorin [ezetimibe-simvastatin]  02/15/2007    Family History  Problem Relation Age of Onset  . Heart failure Father   . Diabetes Father   . Depression Father   . Coronary artery disease Father   . Breast cancer Sister 35  . Lung cancer Sister   . Depression Sister   . Stomach cancer Mother 39  . Stroke Unknown        Paternal side  . Parkinsonism Unknown        Cousin  . Colon cancer Neg Hx   . Rectal cancer Neg Hx   . Esophageal cancer Neg Hx   . Colon polyps Neg Hx     Social History   Social History  . Marital status: Married    Spouse name: [name]  . Number of children: 2  . Years of education: N/A   Occupational History  . Retired    Social History Main Topics  . Smoking status: Never Smoker  . Smokeless tobacco: Former Systems developer    Types: Chew    Quit date: 05/15/2002  . Alcohol use No  . Drug use: No  . Sexual activity: Not on file   Other Topics Concern  . Not on file   Social History Narrative   Has living will.   No formal health care POA---requests wife to be   Would accept  resuscitation attempts   No tube feeds if cognitively unaware    REVIEW OF SYSTEMS: Constitutional:  Syncope this morning. Normally he doesn't experience dizziness or weakness. ENT:  No nose bleeds Pulm:  Denies cough, shortness of breath. CV:  Denies chest pain.  No palpitations, no LE edema.  GU:  No hematuria, no frequency GI:  No heartburn. No indigestion. See history of present illness as well. Heme:  Other than the GI bleeding, does not have tendency for excessive bleeding or bruising.   Transfusions:  2 PRBCs today. Transfused as well in April and in early September 2018. Neuro:  No headaches, no peripheral tingling or numbness Derm:  No itching, no rash or sores.  Endocrine:  No sweats or chills.  No polyuria or dysuria Immunization:  Reviewed previous immunizations. He just got his flu shot on 05/17/17 along with pneumococcal-23.   Travel:  None beyond local counties in last few months.    PHYSICAL EXAM: Vital signs in last 24 hours: Vitals:   06/02/17 1310 06/02/17 1315  BP: (!) 95/56 (!) 90/53  Pulse: 81 81  Resp: 20 (!) 21  Temp:    SpO2: 100% 100%   Wt Readings from Last 3 Encounters:  06/02/17 88.5 kg (195 lb)  06/01/17 88.5 kg (195 lb)  05/18/17 91.6 kg (202 lb)    General: Pale, overweight WM. He is alert and comfortable but looks unwell due to his pallor Head:  No signs of head trauma. No facial asymmetry or swelling.  Eyes:  No scleral icterus. Conjunctiva slightly pale. EOMI. Ears:  Not hard of hearing  Nose:  No discharge or congestion Mouth:  Mucosa is moist, clear.  Tongue midline. Neck:  No JVD, no masses, no thyromegaly. Lungs:  Clear bilaterally. No labored breathing. No cough Heart: RRR. No MRG. S1, S2 present. Abdomen:  Soft. Nontender. Not distended. Slightly obese. No HSM, bruits, masses, hernias..   Rectal: Did not repeat rectal exam. This was performed by Dr. Donne Hazel and is described above. Did not wish to dislodge the Proctofoam which had  been inserted earlier.   Musc/Skeltl: No joint erythema, swelling, gross deformity. Extremities:  No CCE. Pedal capillary refill is brisk. Feet are warm.  Neurologic:  Patient is alert. Oriented times 3. Clear speech. Moves all 4 limbs, strength not tested. No tremors. Skin:  No rash. No suspicious lesions. No sores. Tattoos:  None observed Nodes:  No cervical adenopathy.   Psych:  Calm, pleasant. Affect flat. Cooperative.  Intake/Output from previous day: No intake/output data recorded. Intake/Output this shift: Total I/O In: 2630 [Blood:630; IV Piggyback:2000] Out: -   LAB RESULTS:  Recent Labs  06/02/17 0925 06/02/17 1313  WBC 7.4 9.3  HGB 7.1* 8.8*  HCT 23.1* 27.3*  PLT 327 264   BMET Lab Results  Component Value Date   NA 134 (L) 06/02/2017   NA 139 05/10/2017   NA 134 (L) 05/09/2017   K 5.3 (H) 06/02/2017   K 4.8 05/10/2017   K 4.7 05/09/2017   CL 105 06/02/2017   CL 110 05/10/2017   CL 104 05/09/2017   CO2 20 (L) 06/02/2017   CO2 25 05/10/2017   CO2 22 05/09/2017   GLUCOSE 211 (H) 06/02/2017   GLUCOSE 103 (H) 05/10/2017   GLUCOSE 206 (H) 05/09/2017   BUN 41 (H) 06/02/2017   BUN 20 05/10/2017   BUN 24 (H) 05/09/2017   CREATININE 2.73 (H) 06/02/2017   CREATININE 1.15 05/10/2017   CREATININE 1.28 (H) 05/09/2017   CALCIUM 8.9 06/02/2017   CALCIUM 8.8 (L) 05/10/2017   CALCIUM 9.1 05/09/2017   LFT  Recent Labs  06/02/17 0925  PROT 6.1*  ALBUMIN 3.6  AST 32  ALT 21  ALKPHOS 58  BILITOT 0.8   PT/INR Lab Results  Component Value Date   INR 1.03 06/02/2017   INR 1.02 05/09/2017     RADIOLOGY STUDIES: Dg Abd Portable 1v  Result Date: 06/02/2017 CLINICAL DATA:  Gastrointestinal bleeding EXAM: PORTABLE ABDOMEN - 1 VIEW COMPARISON:  None. FINDINGS: Bowel gas pattern does not show evidence of ileus or obstruction. No abnormal calcifications. Chronic spinal degenerative changes. IMPRESSION: Negative radiographs. No finding specific 2  gastrointestinal bleeding. Electronically Signed   By: Nelson Chimes M.D.   On: 06/02/2017 11:55    IMPRESSION:   *  Recurrent acute hematochezia without abdominal pain. The volume of blood which he passed seems excessive for hemorrhoidal bleeding and seems more consistent with a diverticular source of bleeding. It occurred over the course of about an hour starting 7 AM today. It has not recurred. There was no blood on Dr. Cristal Generous DRE.  *  ABL anemia with syncope.  S/p 2 liters of IVF and 2 units of PRBCs.  Hgb 7.1 >> 8.8.    *  AKI.     PLAN:     *  Tests which could help sort out whether or not this is a diverticular bleed include CT angiography or nuclear medicine tagged bleeding scan. However since the bleeding has stopped, and these tests are notoriously frustrating in terms of discerning active bleeding, have not ordered either of these.  Additionally, with his AKI, CT angiography is probably best avoided.     Azucena Freed  06/02/2017, 1:37 PM Pager: [phone]     Attending physician's note   I have taken  a history, examined the patient and reviewed the chart. I agree with the Advanced Practitioner's note, impression and recommendations.  1. Acute, recurrent painless hematochezia. LGI source: diverticular bleed vs hemorrhoidal bleed. Complete colonoscopy with a good prep in Feb 2017 by Dr. Carlean Purl showing left colon diverticulosis and grade III hemorrhoids  2. ABL anemia  3. AKI  * IVF * Transfusions to keep Hb > 8 * No plans to repeat colonoscopy at this time given recent colonoscopy.  * Bleeding has stopped however if it recurs, persist definitely proceed with a nuclear tagged scan and possible angiogram.    Lucio Edward, MD [facility] [phone] Mon-Fri 8a-5p [phone] after 5p, weekends, holidays

## 2017-06-03 LAB — TYPE AND SCREEN
ABO/RH(D): A POS
Antibody Screen: NEGATIVE
UNIT DIVISION: 0
UNIT DIVISION: 0

## 2017-06-03 LAB — CBC
HCT: 24.5 % — ABNORMAL LOW (ref 39.0–52.0)
HCT: 27.3 % — ABNORMAL LOW (ref 39.0–52.0)
HEMATOCRIT: 25.5 % — AB (ref 39.0–52.0)
HEMATOCRIT: 26 % — AB (ref 39.0–52.0)
HEMATOCRIT: 25.8 % — AB (ref 39.0–52.0)
HEMOGLOBIN: 8.7 g/dL — AB (ref 13.0–17.0)
HEMOGLOBIN: 8 g/dL — AB (ref 13.0–17.0)
Hemoglobin: 8.1 g/dL — ABNORMAL LOW (ref 13.0–17.0)
Hemoglobin: 8.3 g/dL — ABNORMAL LOW (ref 13.0–17.0)
Hemoglobin: 8.2 g/dL — ABNORMAL LOW (ref 13.0–17.0)
MCH: 29.5 pg (ref 26.0–34.0)
MCH: 29.5 pg (ref 26.0–34.0)
MCH: 29.6 pg (ref 26.0–34.0)
MCH: 29.7 pg (ref 26.0–34.0)
MCH: 30.3 pg (ref 26.0–34.0)
MCHC: 31.8 g/dL (ref 30.0–36.0)
MCHC: 31.8 g/dL (ref 30.0–36.0)
MCHC: 31.9 g/dL (ref 30.0–36.0)
MCHC: 31.9 g/dL (ref 30.0–36.0)
MCHC: 32.7 g/dL (ref 30.0–36.0)
MCV: 92.8 fL (ref 78.0–100.0)
MCV: 92.5 fL (ref 78.0–100.0)
MCV: 93.1 fL (ref 78.0–100.0)
MCV: 93.4 fL (ref 78.0–100.0)
MCV: 92.5 fL (ref 78.0–100.0)
PLATELETS: 244 10*3/uL (ref 150–400)
PLATELETS: 252 10*3/uL (ref 150–400)
Platelets: 234 10*3/uL (ref 150–400)
Platelets: 255 10*3/uL (ref 150–400)
Platelets: 277 10*3/uL (ref 150–400)
RBC: 2.73 MIL/uL — ABNORMAL LOW (ref 4.22–5.81)
RBC: 2.95 MIL/uL — AB (ref 4.22–5.81)
RBC: 2.77 MIL/uL — AB (ref 4.22–5.81)
RBC: 2.81 MIL/uL — ABNORMAL LOW (ref 4.22–5.81)
RBC: 2.64 MIL/uL — AB (ref 4.22–5.81)
RDW: 16.4 % — ABNORMAL HIGH (ref 11.5–15.5)
RDW: 16.5 % — ABNORMAL HIGH (ref 11.5–15.5)
RDW: 16.7 % — AB (ref 11.5–15.5)
RDW: 17.2 % — AB (ref 11.5–15.5)
RDW: 17.5 % — ABNORMAL HIGH (ref 11.5–15.5)
WBC: 6.7 10*3/uL (ref 4.0–10.5)
WBC: 5.9 10*3/uL (ref 4.0–10.5)
WBC: 5.8 10*3/uL (ref 4.0–10.5)
WBC: 6.8 10*3/uL (ref 4.0–10.5)
WBC: 6.2 10*3/uL (ref 4.0–10.5)

## 2017-06-03 LAB — BASIC METABOLIC PANEL
ANION GAP: 7 (ref 5–15)
ANION GAP: 4 — AB (ref 5–15)
Anion gap: 5 (ref 5–15)
BUN: 19 mg/dL (ref 6–20)
BUN: 29 mg/dL — AB (ref 6–20)
BUN: 23 mg/dL — AB (ref 6–20)
CALCIUM: 8.3 mg/dL — AB (ref 8.9–10.3)
CALCIUM: 8.4 mg/dL — AB (ref 8.9–10.3)
CHLORIDE: 110 mmol/L (ref 101–111)
CO2: 22 mmol/L (ref 22–32)
CO2: 25 mmol/L (ref 22–32)
CO2: 23 mmol/L (ref 22–32)
CREATININE: 1.19 mg/dL (ref 0.61–1.24)
CREATININE: 1.58 mg/dL — AB (ref 0.61–1.24)
CREATININE: 1.4 mg/dL — AB (ref 0.61–1.24)
Calcium: 8.1 mg/dL — ABNORMAL LOW (ref 8.9–10.3)
Chloride: 108 mmol/L (ref 101–111)
Chloride: 107 mmol/L (ref 101–111)
GFR calc Af Amer: 47 mL/min — ABNORMAL LOW (ref >60)
GFR calc Af Amer: 54 mL/min — ABNORMAL LOW (ref >60)
GFR calc non Af Amer: 41 mL/min — ABNORMAL LOW (ref >60)
GFR calc non Af Amer: 57 mL/min — ABNORMAL LOW (ref >60)
GFR, EST NON AFRICAN AMERICAN: 47 mL/min — AB (ref >60)
GLUCOSE: 109 mg/dL — AB (ref 65–99)
GLUCOSE: 87 mg/dL (ref 65–99)
Glucose, Bld: 148 mg/dL — ABNORMAL HIGH (ref 65–99)
POTASSIUM: 4.7 mmol/L (ref 3.5–5.1)
Potassium: 5 mmol/L (ref 3.5–5.1)
Potassium: 5 mmol/L (ref 3.5–5.1)
SODIUM: 136 mmol/L (ref 135–145)
SODIUM: 137 mmol/L (ref 135–145)
Sodium: 138 mmol/L (ref 135–145)

## 2017-06-03 LAB — GLUCOSE, CAPILLARY
GLUCOSE-CAPILLARY: 110 mg/dL — AB (ref 65–99)
Glucose-Capillary: 89 mg/dL (ref 65–99)
Glucose-Capillary: 140 mg/dL — ABNORMAL HIGH (ref 65–99)
Glucose-Capillary: 135 mg/dL — ABNORMAL HIGH (ref 65–99)

## 2017-06-03 LAB — BPAM RBC
BLOOD PRODUCT EXPIRATION DATE: 201810112359
BLOOD PRODUCT EXPIRATION DATE: 201810112359
ISSUE DATE / TIME: 201810051035
ISSUE DATE / TIME: 201810051035
Unit Type and Rh: 600
Unit Type and Rh: 600

## 2017-06-03 LAB — MRSA PCR SCREENING: MRSA by PCR: NEGATIVE

## 2017-06-03 MED ORDER — ORAL CARE MOUTH RINSE
15.0000 mL | Freq: Two times a day (BID) | OROMUCOSAL | Status: DC
  Administered 2017-06-03 – 2017-06-04 (×3): 15 mL via OROMUCOSAL

## 2017-06-03 MED ORDER — ONDANSETRON HCL 4 MG/2ML IJ SOLN
4.0000 mg | Freq: Four times a day (QID) | INTRAMUSCULAR | Status: DC | PRN
  Administered 2017-06-03: 4 mg via INTRAVENOUS
  Filled 2017-06-03: qty 2

## 2017-06-03 MED ORDER — ATORVASTATIN CALCIUM 20 MG PO TABS
20.0000 mg | ORAL_TABLET | Freq: Every day | ORAL | Status: DC
  Administered 2017-06-03: 20 mg via ORAL
  Filled 2017-06-03 (×2): qty 1

## 2017-06-03 MED ORDER — DEXTROSE 50 % IV SOLN
25.0000 mL | Freq: Once | INTRAVENOUS | Status: AC
  Administered 2017-06-03: 25 mL via INTRAVENOUS
  Filled 2017-06-03: qty 50

## 2017-06-03 NOTE — Progress Notes (Signed)
     [facility] Progress Note  Chief Complaint:  GI bleed, anemai   Subjective: Feels better than yesterday. No bleeding since yesterday.   Objective:  Vital signs in last 24 hours: Temp:  [97.6 F (36.4 C)-98.7 F (37.1 C)] 98.2 F (36.8 C) (10/06 1211) Pulse Rate:  [53-89] 78 (10/06 1100) Resp:  [15-28] 19 (10/06 1100) BP: (75-125)/(45-66) 125/66 (10/06 1100) SpO2:  [95 %-100 %] 100 % (10/06 1100) Weight:  [205 lb 0.4 oz (93 kg)] 205 lb 0.4 oz (93 kg) (10/06 0247) Last BM Date: 06/02/17 General:   Alert, well-developed, in NAD EENT:  Normal hearing, non icteric sclera, conjunctive pink.  Heart:  Regular rate and rhythm; no murmurs.   Pulm: Normal respiratory effort, lungs CTA bilaterally without wheezes or crackles. Abdomen:  Soft, nondistended, nontender.  Normal bowel sounds, no masses felt. No hepatomegaly.    Neurologic:  Alert and  oriented x4;  grossly normal neurologically. Psych:  Pleasant, cooperative.  Normal mood and affect.   Intake/Output from previous day: 10/05 0701 - 10/06 0700 In: 4305 [I.V.:1675; Blood:630; IV Piggyback:2000] Out: 1200 [Urine:1200] Intake/Output this shift: Total I/O In: 150 [I.V.:150] Out: 350 [Urine:350]  Lab Results:  Recent Labs  06/03/17 0033 06/03/17 0549 06/03/17 1143  WBC 6.7 5.9 5.8  HGB 8.0* 8.1* 8.3*  HCT 24.5* 25.5* 26.0*  PLT 234 244 252   BMET  Recent Labs  06/02/17 2107 06/03/17 0033 06/03/17 0549  NA 138 137 138  K 5.2* 4.7 5.0  CL 112* 110 108  CO2 22 22 23   GLUCOSE 86 109* 87  BUN 33* 29* 23*  CREATININE 1.85* 1.58* 1.40*  CALCIUM 8.1* 8.1* 8.3*   LFT  Recent Labs  06/02/17 0925  PROT 6.1*  ALBUMIN 3.6  AST 32  ALT 21  ALKPHOS 58  BILITOT 0.8   PT/INR  Recent Labs  06/02/17 1021 06/02/17 1737  LABPROT 13.4 13.4  INR 1.03 1.03    Dg Abd Portable 1v  Result Date: 06/02/2017 CLINICAL DATA:  Gastrointestinal bleeding EXAM: PORTABLE ABDOMEN - 1 VIEW COMPARISON:   None. FINDINGS: Bowel gas pattern does not show evidence of ileus or obstruction. No abnormal calcifications. Chronic spinal degenerative changes. IMPRESSION: Negative radiographs. No finding specific 2 gastrointestinal bleeding. Electronically Signed   By: Nelson Chimes M.D.   On: 06/02/2017 11:55    ASSESSMENT / PLAN:   1. 76 yo male with painless hematochezia - diverticular vs hemorrhoidal. Chronic anemia, baseline hgb difficult to ascertain but 9.9 mid Sept down to 7.1 on admission. After 2 units of blood he is at 8.3. Continue to observe for rebleed. Nuclear tagged RBC scan for rebleed. Trend CBC.    Active Problems:   GI bleed    LOS: 1 day   Randy Mcdaniel ,NP 06/03/2017, 12:32 PM      Attending physician's note   I have taken an interval history, reviewed the chart and examined the patient. I agree with the Advanced Practitioner's note, impression and recommendations.   Lucio Edward, MD [facility] [phone] Mon-Fri 8a-5p [phone] after 5p, weekends, holidays  Pager number [phone]

## 2017-06-03 NOTE — Progress Notes (Signed)
Pt. Is resting comfortable. Free of pain, he was educated on menu, tv. Skin is intact free of breakdown. Will continue to monitor.

## 2017-06-03 NOTE — Progress Notes (Signed)
Report attempted to 5W unsuccessful

## 2017-06-03 NOTE — Progress Notes (Signed)
[facility] Progress Note    Subjective: Patient is stable and has no pain.  Explained to him our current dilemma and no intentions of performing surgery.  Objective: Vital signs in last 24 hours: Temp:  [97.1 F (36.2 C)-98.7 F (37.1 C)] 97.6 F (36.4 C) (10/06 0357) Pulse Rate:  [26-112] 53 (10/06 0700) Resp:  [15-33] 20 (10/06 0700) BP: (71-115)/(44-92) 77/50 (10/06 0700) SpO2:  [88 %-100 %] 100 % (10/06 0700) Weight:  [88.5 kg (195 lb)-93 kg (205 lb 0.4 oz)] 93 kg (205 lb 0.4 oz) (10/06 0247) Last BM Date: 06/02/17  Intake/Output from previous day: 10/05 0701 - 10/06 0700 In: 6203 [I.V.:1600; Blood:630; IV Piggyback:2000] Out: 1200 [Urine:1200] Intake/Output this shift: No intake/output data recorded.  General: No acute distress  Lungs: Clear  Abd: Soft, not tender, good bowel sounds  Extremities: Intact  Neuro: Intact  Lab Results:  @LABLAST2 (wbc:2,hgb:2,hct:2,plt:2) BMET ) Recent Labs  06/03/17 0033 06/03/17 0549  NA 137 138  K 4.7 5.0  CL 110 108  CO2 22 23  GLUCOSE 109* 87  BUN 29* 23*  CREATININE 1.58* 1.40*  CALCIUM 8.1* 8.3*   PT/INR  Recent Labs  06/02/17 1021 06/02/17 1737  LABPROT 13.4 13.4  INR 1.03 1.03   ABG No results for input(s): PHART, HCO3 in the last 72 hours.  Invalid input(s): PCO2, PO2  Studies/Results: Dg Abd Portable 1v  Result Date: 06/02/2017 CLINICAL DATA:  Gastrointestinal bleeding EXAM: PORTABLE ABDOMEN - 1 VIEW COMPARISON:  None. FINDINGS: Bowel gas pattern does not show evidence of ileus or obstruction. No abnormal calcifications. Chronic spinal degenerative changes. IMPRESSION: Negative radiographs. No finding specific 2 gastrointestinal bleeding. Electronically Signed   By: Nelson Chimes M.D.   On: 06/02/2017 11:55    Anti-infectives: Anti-infectives    None      Assessment/Plan: s/p  Advance diet If he rebleeds get NM scan and possible angio  LOS: 1 day   Kathryne Eriksson. Dahlia Bailiff, MD,  FACS [phone] [facility] 06/03/2017

## 2017-06-03 NOTE — Progress Notes (Signed)
   Name: STANDLEY BARGO Harless Litten. MRN: [id number] DOB: 1941/01/22    ADMISSION DATE:  06/02/2017 CONSULTATION DATE:  10/5  REFERRING MD :  Dr. Alvino Chapel  CHIEF COMPLAINT:  GI bleeding  Brief:   76 year old male with PMH of DM, HLD, and recurrent lower GI bleed secondary to internal hemorrhoids. He is followed by Dr. Carlean Purl and has been admitted multiple times for blood transfusion.   10/4 and was scheduled for a hemorrhoidectomy and proctoscopy in the near future. 10/5 in the AM hours he developed recurrent GI bleeding that was uncontrollable at home and he presented to [facility] ED.  SIGNIFICANT EVENTS  10/5 > Presents to ED   STUDIES:  KUB 10/5 > Bowel gas pattern does not show evidence of ileus or obstruction. No abnormal calcifications. Chronic spinal degenerative changes.  SUBJECTIVE:  No further bleeding overnight, denies pain    VITAL SIGNS: Temp:  [97.1 F (36.2 C)-98.7 F (37.1 C)] 97.6 F (36.4 C) (10/06 0357) Pulse Rate:  [26-112] 53 (10/06 0700) Resp:  [15-33] 20 (10/06 0700) BP: (71-115)/(44-92) 77/50 (10/06 0700) SpO2:  [88 %-100 %] 100 % (10/06 0700) Weight:  [88.5 kg (195 lb)-93 kg (205 lb 0.4 oz)] 93 kg (205 lb 0.4 oz) (10/06 0247)  PHYSICAL EXAMINATION: General:  Elderly male, no distress  Neuro:  Alert, oriented, follows commands   HEENT:  MMM  Cardiovascular:  RRR, no MRG  Lungs:  Clear breath sounds, non-labored  Abdomen:  Soft, non-tender  Musculoskeletal: -edema  Skin:  Warm, dry, intact    Recent Labs Lab 06/02/17 2107 06/03/17 0033 06/03/17 0549  NA 138 137 138  K 5.2* 4.7 5.0  CL 112* 110 108  CO2 22 22 23   BUN 33* 29* 23*  CREATININE 1.85* 1.58* 1.40*  GLUCOSE 86 109* 87    Recent Labs Lab 06/02/17 2107 06/03/17 0033 06/03/17 0549  HGB 8.4* 8.0* 8.1*  HCT 25.8* 24.5* 25.5*  WBC 7.3 6.7 5.9  PLT 261 234 244   Dg Abd Portable 1v  Result Date: 06/02/2017 CLINICAL DATA:  Gastrointestinal bleeding EXAM: PORTABLE ABDOMEN - 1  VIEW COMPARISON:  None. FINDINGS: Bowel gas pattern does not show evidence of ileus or obstruction. No abnormal calcifications. Chronic spinal degenerative changes. IMPRESSION: Negative radiographs. No finding specific 2 gastrointestinal bleeding. Electronically Signed   By: Nelson Chimes M.D.   On: 06/02/2017 11:55     ASSESSMENT / PLAN:  Acute blood loss anemia secondary to internal hemorrhoids s/p multiple hemorrhoidal banding with most recent 05/08/17  -Anoscopy 05/08/17 showed grade 2 inflamed hemorrhoids -KUB negative  -Surgifoam inserted by surgery  -Internal hemorrhoidectomy and proctoscopy for 06/14/17  -No further bleeding noted, given 2 units RBC 10/5  P:  - Trend CBC q6h  - Maintain Hbg >8  - Surgery and GI following -- GI with no plans to scope at this time   Hemorrhagic shock secondary to above > Resolving  P:  - Cardiac Monitoring  - MAP goal > 65 mmHg  Hyperkalemia AKI in setting shock (Baseline Crt 1.1-1.2)  P: - BMP q12h - Replace electrolytes as indicated   HLD, HTN P: - Continue Lipitor  - Holding lisinopril in sitting of hypotension   DM P:  - CBG monitoring and SSI  Hypothyroidism  P: - Continue synthroid   CC Time: 35 minutes   Hayden Pedro, AGACNP-BC [facility]  Pgr: [phone]  PCCM Pgr: [phone]

## 2017-06-04 LAB — BASIC METABOLIC PANEL
Anion gap: 6 (ref 5–15)
Anion gap: 7 (ref 5–15)
BUN: 16 mg/dL (ref 6–20)
BUN: 12 mg/dL (ref 6–20)
CHLORIDE: 103 mmol/L (ref 101–111)
CHLORIDE: 102 mmol/L (ref 101–111)
CO2: 27 mmol/L (ref 22–32)
CO2: 25 mmol/L (ref 22–32)
CREATININE: 1.21 mg/dL (ref 0.61–1.24)
Calcium: 8.4 mg/dL — ABNORMAL LOW (ref 8.9–10.3)
Calcium: 8.9 mg/dL (ref 8.9–10.3)
Creatinine, Ser: 1.16 mg/dL (ref 0.61–1.24)
GFR calc Af Amer: >60 mL/min (ref >60)
GFR calc non Af Amer: 56 mL/min — ABNORMAL LOW (ref >60)
GFR calc non Af Amer: 59 mL/min — ABNORMAL LOW (ref >60)
GLUCOSE: 136 mg/dL — AB (ref 65–99)
Glucose, Bld: 221 mg/dL — ABNORMAL HIGH (ref 65–99)
POTASSIUM: 4.7 mmol/L (ref 3.5–5.1)
Potassium: 4.8 mmol/L (ref 3.5–5.1)
SODIUM: 134 mmol/L — AB (ref 135–145)
Sodium: 136 mmol/L (ref 135–145)

## 2017-06-04 LAB — CBC
HCT: 28.3 % — ABNORMAL LOW (ref 39.0–52.0)
HEMATOCRIT: 30.2 % — AB (ref 39.0–52.0)
HEMOGLOBIN: 9 g/dL — AB (ref 13.0–17.0)
Hemoglobin: 9.7 g/dL — ABNORMAL LOW (ref 13.0–17.0)
MCH: 29.8 pg (ref 26.0–34.0)
MCH: 30 pg (ref 26.0–34.0)
MCHC: 31.8 g/dL (ref 30.0–36.0)
MCHC: 32.1 g/dL (ref 30.0–36.0)
MCV: 93.7 fL (ref 78.0–100.0)
MCV: 93.5 fL (ref 78.0–100.0)
Platelets: 260 10*3/uL (ref 150–400)
Platelets: 295 10*3/uL (ref 150–400)
RBC: 3.02 MIL/uL — AB (ref 4.22–5.81)
RBC: 3.23 MIL/uL — AB (ref 4.22–5.81)
RDW: 16.4 % — ABNORMAL HIGH (ref 11.5–15.5)
RDW: 16.1 % — ABNORMAL HIGH (ref 11.5–15.5)
WBC: 6.4 10*3/uL (ref 4.0–10.5)
WBC: 7.1 10*3/uL (ref 4.0–10.5)

## 2017-06-04 LAB — PHOSPHORUS: PHOSPHORUS: 2.5 mg/dL (ref 2.5–4.6)

## 2017-06-04 LAB — MAGNESIUM: MAGNESIUM: 1.6 mg/dL — AB (ref 1.7–2.4)

## 2017-06-04 MED ORDER — GABAPENTIN 600 MG PO TABS: 300.0000 mg | ORAL_TABLET | Freq: Every day | ORAL | Status: DC

## 2017-06-04 MED ORDER — PANTOPRAZOLE SODIUM 40 MG PO TBEC: 40.0000 mg | DELAYED_RELEASE_TABLET | Freq: Every day | ORAL | Status: DC

## 2017-06-04 MED ORDER — SODIUM CHLORIDE 0.9 % IV SOLN
INTRAVENOUS | Status: DC
  Administered 2017-06-04: 02:00:00 via INTRAVENOUS

## 2017-06-04 NOTE — Progress Notes (Signed)
Nsg Discharge Note  Admit Date:  06/02/2017 Discharge date: 06/04/2017   [place]. to be D/C'd Home per MD order.  AVS completed.  Copy for chart, and copy for patient signed, and dated. Patient/caregiver able to verbalize understanding.  Discharge Medication: Allergies as of 06/04/2017      Reactions   Vytorin [ezetimibe-simvastatin]    REACTION: nausea      Medication List    STOP taking these medications   lisinopril 10 MG tablet Commonly known as:  PRINIVIL,ZESTRIL     TAKE these medications   acetaminophen 650 MG CR tablet Commonly known as:  TYLENOL Take 650 mg by mouth as needed.   atorvastatin 20 MG tablet Commonly known as:  LIPITOR TAKE 1 TABLET BY MOUTH ONCE DAILY What changed:  See the new instructions.   escitalopram 20 MG tablet Commonly known as:  LEXAPRO Take 1 tablet (20 mg total) by mouth every morning.   gabapentin 300 MG capsule Commonly known as:  NEURONTIN TAKE 1 TO 2 CAPSULES BY MOUTH AT BEDTIME What changed:  See the new instructions.   glipiZIDE 5 MG tablet Commonly known as:  GLUCOTROL TAKE 1 TABLET BY MOUTH TWICE A DAY BEFORE A MEAL What changed:  See the new instructions.   hydrocortisone-pramoxine 2.5-1 % rectal cream Commonly known as:  ANALPRAM HC Place 1 application rectally 2 (two) times daily.   IRON SLOW RELEASE PO Take 625 mg by mouth daily.   levothyroxine 75 MCG tablet Commonly known as:  SYNTHROID, LEVOTHROID TAKE 1 TABLET BY MOUTH ONCE A DAY What changed:  See the new instructions.   metFORMIN 500 MG tablet Commonly known as:  GLUCOPHAGE TAKE 1 TABLET BY MOUTH 3 TIMES DAILY What changed:  See the new instructions.   methylphenidate 5 MG tablet Commonly known as:  RITALIN Take 1 tablet (5 mg total) by mouth 2 (two) times daily. What changed:  when to take this   OLANZapine 10 MG tablet Commonly known as:  ZYPREXA TAKE 1 TABLET BY MOUTH AT BEDTIME What changed:  See the new instructions.    polyethylene glycol packet Commonly known as:  MIRALAX / GLYCOLAX Take 17 g by mouth daily.   vitamin B-12 1000 MCG tablet Commonly known as:  CYANOCOBALAMIN Take 1,000 mcg by mouth daily.       Discharge Assessment: Vitals:   06/03/17 1440 06/03/17 2115  BP: (!) 118/57 117/63  Pulse: 85 78  Resp: 17 18  Temp: 98.8 F (37.1 C) 99 F (37.2 C)  SpO2: 93% 94%   Skin clean, dry and intact without evidence of skin break down, no evidence of skin tears noted. IV catheter discontinued intact. Site without signs and symptoms of complications - no redness or edema noted at insertion site, patient denies c/o pain - only slight tenderness at site.  Dressing with slight pressure applied.  D/c Instructions-Education: Discharge instructions given to patient/family with verbalized understanding. D/c education completed with patient/family including follow up instructions, medication list, d/c activities limitations if indicated, with other d/c instructions as indicated by MD - patient able to verbalize understanding, all questions fully answered. Patient instructed to return to ED, call 911, or call MD for any changes in condition.  Patient escorted via [place], and D/C home via private auto.  Eliot Ford, RN 06/04/2017 4:34 PM

## 2017-06-04 NOTE — Progress Notes (Addendum)
[facility] Progress Note  Chief Complaint:    Rectal bleeding  Subjective: No further bleeding, feels okay. Family in the room, have several questions about why hemorrhoid surgery cannot be done here as inpatient.   Objective:  Vital signs in last 24 hours: Temp:  [98.8 F (37.1 C)-99 F (37.2 C)] 99 F (37.2 C) (10/06 2115) Pulse Rate:  [78-85] 78 (10/06 2115) Resp:  [17-23] 18 (10/06 2115) BP: (117-133)/(56-63) 117/63 (10/06 2115) SpO2:  [93 %-96 %] 94 % (10/06 2115) Weight:  [204 lb 9.4 oz (92.8 kg)] 204 lb 9.4 oz (92.8 kg) (10/06 1440) Last BM Date: 06/02/17 General:   Alert, well-developed,  White male  in NAD EENT:  Normal hearing, non icteric sclera, conjunctive pink.  Pulm: Normal respiratory effort Abdomen:  Soft, nondistended, nontender.  Normal bowel sounds, no masses felt. No hepatomegaly.    Neurologic:  Alert and  oriented x4;  grossly normal neurologically. Psych:  Pleasant, cooperative.  Normal mood and affect.   Intake/Output from previous day: 10/06 0701 - 10/07 0700 In: 867.5 [I.V.:867.5] Out: 1500 [Urine:1500] Intake/Output this shift: No intake/output data recorded.  Lab Results:  Recent Labs  06/03/17 2324 06/04/17 0653 06/04/17 1026  WBC 6.8 6.4 7.1  HGB 8.2* 9.0* 9.7*  HCT 25.8* 28.3* 30.2*  PLT 255 260 295   BMET  Recent Labs  06/03/17 1143 06/03/17 2324 06/04/17 1026  NA 136 136 134*  K 5.0 4.8 4.7  CL 107 103 102  CO2 25 27 25   GLUCOSE 148* 136* 221*  BUN 19 16 12   CREATININE 1.19 1.21 1.16  CALCIUM 8.4* 8.4* 8.9   LFT  Recent Labs  06/02/17 0925  PROT 6.1*  ALBUMIN 3.6  AST 32  ALT 21  ALKPHOS 58  BILITOT 0.8   PT/INR  Recent Labs  06/02/17 1021 06/02/17 1737  LABPROT 13.4 13.4  INR 1.03 1.03   ASSESSMENT / PLAN:   1. 76 yo male with recurrent rectal bleeding. He has had significant bleeding requiring transfusion in the past. Etiology of bleeding always felt to be hemorrhoidal but  diverticular hemorrhage also been on list of DDx as well. Multiple colonoscopic evaluations have not revealed anything other than diverticulosis and hemorrhoids which have been banded by Dr. Carlean Purl. Patient is scheduled for an outpatient hemorrhoidectomy on the 17th but family upset that it cannot be done now while hospitalized. Surgery has already made their recommendation. Patient is not actively bleeding for [place] to do a bleeding scan to confirm souce for sure but we feel pretty confident that source is from hemorrhoids. However a diverticular bleed cannot be excluded with certainty. OK for discharge.   Discharge Planning  Discharge Medications:   Stool softener daily Follow up: Dr. Carlean Purl prn Would not worry about sending him home on any steroid preparations for hemorrhoids, probably futile at this point.    Active Problems:   GI bleed   LOS: 2 days   Tye Savoy ,NP 06/04/2017, 1:25 PM Pager number [phone]     Attending physician's note   I have taken an interval history, reviewed the chart and examined the patient. I agree with the Advanced Practitioner's note, impression and recommendations. No bleeding for past 2 days. Recurrent hemorrhoidal bleeding is the favored diagnosis however a diverticular bleed is not excluded. His hemorrhoids intermittent bleed for a few years and have not responded to banding so we agree with plans for hemorrhoidectomy. OK for discharge home today from GI standpoint.  GI signing off. Keep scheduled surgery on 10/17 with Dr. Jamal Collin for hemorrhoidectomy.   Lucio Edward, MD [facility] [phone] Mon-Fri 8a-5p [phone] after 5p, weekends, holidays

## 2017-06-04 NOTE — Progress Notes (Signed)
[facility] Progress Note    Subjective: Patient has had no further bleeding.  He can go ahead and electively have his hemorrhoids removed by his surgeon in [place].  Objective: Vital signs in last 24 hours: Temp:  [98.2 F (36.8 C)-99 F (37.2 C)] 99 F (37.2 C) (10/06 2115) Pulse Rate:  [56-85] 78 (10/06 2115) Resp:  [17-24] 18 (10/06 2115) BP: (105-133)/(54-66) 117/63 (10/06 2115) SpO2:  [93 %-100 %] 94 % (10/06 2115) Weight:  [92.8 kg (204 lb 9.4 oz)] 92.8 kg (204 lb 9.4 oz) (10/06 1440) Last BM Date: 06/02/17  Intake/Output from previous day: 10/06 0701 - 10/07 0700 In: 867.5 [I.V.:867.5] Out: 1500 [Urine:1500] Intake/Output this shift: No intake/output data recorded.  General: No distress.  Lungs: Clear  Abd: Benign.   Extremities: No changes  Neuro: Intact  Lab Results:  @LABLAST2 (wbc:2,hgb:2,hct:2,plt:2) BMET ) Recent Labs  06/03/17 1143 06/03/17 2324  NA 136 136  K 5.0 4.8  CL 107 103  CO2 25 27  GLUCOSE 148* 136*  BUN 19 16  CREATININE 1.19 1.21  CALCIUM 8.4* 8.4*   PT/INR  Recent Labs  06/02/17 1021 06/02/17 1737  LABPROT 13.4 13.4  INR 1.03 1.03   ABG No results for input(s): PHART, HCO3 in the last 72 hours.  Invalid input(s): PCO2, PO2  Studies/Results: Dg Abd Portable 1v  Result Date: 06/02/2017 CLINICAL DATA:  Gastrointestinal bleeding EXAM: PORTABLE ABDOMEN - 1 VIEW COMPARISON:  None. FINDINGS: Bowel gas pattern does not show evidence of ileus or obstruction. No abnormal calcifications. Chronic spinal degenerative changes. IMPRESSION: Negative radiographs. No finding specific 2 gastrointestinal bleeding. Electronically Signed   By: Nelson Chimes M.D.   On: 06/02/2017 11:55    Anti-infectives: Anti-infectives    None      Assessment/Plan: s/p  Advance diet Elective hemorrhoidal surgery after discharge  Flexible sigmoidoscopy or colonoscopy if rebleeds prior to discharge versus nuclear medicine study  LOS: 2 days    Kathryne Eriksson. Dahlia Bailiff, MD, FACS [phone] [facility] 06/04/2017

## 2017-06-04 NOTE — Progress Notes (Signed)
PROGRESS NOTE    Randy Mcdaniel.  FIE:[id number] DOB: September 21, 1940 DOA: 06/02/2017 PCP: Venia Carbon, MD  Brief Narrative: 76 year old male transferred from ICU service to [facility] 10/7, admitted with recurrent lower GI bleed secondary to hemorrhoids versus diverticulosis, he was scheduled for hemorrhoidectomy prior to admission. -status post PRBC transfusion, no further bleeding, hemoglobin stable -Transferred from ICU to [facility] service today 10/7   Assessment & Plan:   1. Hemorrhagic shock - resolved - status post PRBC transfusion and fluid resuscitation  2. Lower GI bleed secondary to hemorrhoids versus diverticulosis -status post multiple hemorrhoidal bandings -was scheduled for hemorrhoidectomy and proctoscopy prior to admission -Bleeding resolved now, not on anticoagulation or antiplatelet agents -Hemoglobin stable/improved -Further management per GI/surgery  3. Acute blood loss anemia -Status post 2 units of PRBC transfusion, hemoglobin stable and improving - resume iron at discharge  4. Hypothyroidism -resume Synthroid  5.   Type 2 diabetes mellitus with neurological manifestations, controlled ([place]) -resume, glipizide and metformin at discharge -Sliding-scale insulin for now  6.  Panic anxiety syndrome -resume Adderall and Zyprexa per home regimen  DVT prophylaxis: SCDs Code Status: Full code Family Communication: no family at bedside Disposition Plan: home pending evaluation per consultants  Consultants:   GI and general surgery   Procedures:   Antimicrobials:    Subjective: no further bleeding, remains frustrated regarding recurrent bleeding  Objective: Vitals:   06/03/17 1300 06/03/17 1400 06/03/17 1440 06/03/17 2115  BP: (!) 114/58 (!) 133/56 (!) 118/57 117/63  Pulse: (!) 56 81 85 78  Resp: (!) 21 (!) 23 17 18   Temp:   98.8 F (37.1 C) 99 F (37.2 C)  TempSrc:   Oral   SpO2: 96% 96% 93% 94%  Weight:   92.8 kg (204 lb 9.4 oz)   Height:    5\' 10"  (1.778 m)     Intake/Output Summary (Last 24 hours) at 06/04/17 1354 Last data filed at 06/04/17 0300  Gross per 24 hour  Intake            417.5 ml  Output              950 ml  Net           -532.5 ml   Filed Weights   06/02/17 0932 06/03/17 0247 06/03/17 1440  Weight: 88.5 kg (195 lb) 93 kg (205 lb 0.4 oz) 92.8 kg (204 lb 9.4 oz)    Examination:  General exam: Appears calm and comfortable  Respiratory system: Clear to auscultation. Respiratory effort normal. Cardiovascular system: S1 & S2 heard, RRR. No JVD, murmurs, rubs, gallops Gastrointestinal system: Abdomen is nondistended, soft and nontender.Normal bowel sounds heard. Central nervous system: Alert and oriented. No focal neurological deficits. Extremities: Symmetric 5 x 5 power. Skin: No rashes, lesions or ulcers Psychiatry: Judgement and insight appear normal. Mood & affect appropriate.     Data Reviewed:   CBC:  Recent Labs Lab 06/02/17 1737  06/03/17 1143 06/03/17 1756 06/03/17 2324 06/04/17 0653 06/04/17 1026  WBC 8.2  < > 5.8 6.2 6.8 6.4 7.1  NEUTROABS 6.2  --   --   --   --   --   --   HGB 8.1*  < > 8.3* 8.7* 8.2* 9.0* 9.7*  HCT 24.5*  < > 26.0* 27.3* 25.8* 28.3* 30.2*  MCV 92.1  < > 92.5 92.5 93.1 93.7 93.5  PLT 243  < > 252 277 255 260 295  < > = values  in this interval not displayed. Basic Metabolic Panel:  Recent Labs Lab 06/03/17 0033 06/03/17 0549 06/03/17 1143 06/03/17 2324 06/04/17 0653 06/04/17 1026  NA 137 138 136 136  --  134*  K 4.7 5.0 5.0 4.8  --  4.7  CL 110 108 107 103  --  102  CO2 22 23 25 27   --  25  GLUCOSE 109* 87 148* 136*  --  221*  BUN 29* 23* 19 16  --  12  CREATININE 1.58* 1.40* 1.19 1.21  --  1.16  CALCIUM 8.1* 8.3* 8.4* 8.4*  --  8.9  MG  --   --   --   --  1.6*  --   PHOS  --   --   --   --  2.5  --    GFR: Estimated Creatinine Clearance: 61 mL/min (by C-G formula based on SCr of 1.16 mg/dL). Liver Function Tests:  Recent Labs Lab 06/02/17 0925    AST 32  ALT 21  ALKPHOS 58  BILITOT 0.8  PROT 6.1*  ALBUMIN 3.6   No results for input(s): LIPASE, AMYLASE in the last 168 hours. No results for input(s): AMMONIA in the last 168 hours. Coagulation Profile:  Recent Labs Lab 06/02/17 1021 06/02/17 1737  INR 1.03 1.03   Cardiac Enzymes:  Recent Labs Lab 06/02/17 1021  TROPONINI <0.03   BNP (last 3 results) No results for input(s): PROBNP in the last 8760 hours. HbA1C: No results for input(s): HGBA1C in the last 72 hours. CBG:  Recent Labs Lab 06/02/17 2031 06/03/17 0030 06/03/17 0815 06/03/17 1212 06/03/17 2327  GLUCAP 87 110* 89 140* 135*   Lipid Profile: No results for input(s): CHOL, HDL, LDLCALC, TRIG, CHOLHDL, LDLDIRECT in the last 72 hours. Thyroid Function Tests: No results for input(s): TSH, T4TOTAL, FREET4, T3FREE, THYROIDAB in the last 72 hours. Anemia Panel: No results for input(s): VITAMINB12, FOLATE, FERRITIN, TIBC, IRON, RETICCTPCT in the last 72 hours. Urine analysis: No results found for: COLORURINE, APPEARANCEUR, LABSPEC, [place], GLUCOSEU, HGBUR, BILIRUBINUR, KETONESUR, PROTEINUR, UROBILINOGEN, NITRITE, LEUKOCYTESUR Sepsis Labs: @LABRCNTIP (procalcitonin:4,lacticidven:4)  ) Recent Results (from the past 240 hour(s))  MRSA PCR Screening     Status: None   Collection Time: 06/02/17  9:36 PM  Result Value Ref Range Status   MRSA by PCR NEGATIVE NEGATIVE Final    Comment:        The GeneXpert MRSA Assay (FDA approved for NASAL specimens only), is one component of a comprehensive MRSA colonization surveillance program. It is not intended to diagnose MRSA infection nor to guide or monitor treatment for MRSA infections.          Radiology Studies: No results found.      Scheduled Meds: . atorvastatin  20 mg Oral q1800  . levothyroxine  37.5 mcg Intravenous Daily  . mouth rinse  15 mL Mouth Rinse BID  . pantoprazole (PROTONIX) IV  40 mg Intravenous QHS   Continuous  Infusions: . sodium chloride 10 mL/hr at 06/04/17 0914     LOS: 2 days    Time spent: 33min, including time spent reviewing chart    Domenic Polite, MD [facility] Page via [email or web address], password [email or web address] After 7PM please contact night-coverage  06/04/2017, 1:54 PM

## 2017-06-05 ENCOUNTER — Inpatient Hospital Stay: Payer: Medicare Other | Admitting: Oncology

## 2017-06-05 ENCOUNTER — Encounter: Payer: Self-pay | Admitting: Internal Medicine

## 2017-06-05 NOTE — Telephone Encounter (Signed)
Spoke with the patient and notified of pre admit testing date and time, 06/05/17 at 9:30 am in the medical arts building. The patient is aware of date, time, and instructions.

## 2017-06-06 ENCOUNTER — Other Ambulatory Visit (INDEPENDENT_AMBULATORY_CARE_PROVIDER_SITE_OTHER): Payer: Medicare Other

## 2017-06-06 ENCOUNTER — Encounter
Admission: RE | Admit: 2017-06-06 | Discharge: 2017-06-06 | Disposition: A | Discharge status: Home / Self Care | Payer: Medicare Other | Source: Ambulatory Visit | Attending: General Surgery | Admitting: General Surgery

## 2017-06-06 ENCOUNTER — Telehealth: Payer: Self-pay

## 2017-06-06 DIAGNOSIS — E039 Hypothyroidism, unspecified: Secondary | ICD-10-CM | POA: Diagnosis not present

## 2017-06-06 DIAGNOSIS — M48062 Spinal stenosis, lumbar region with neurogenic claudication: Secondary | ICD-10-CM | POA: Insufficient documentation

## 2017-06-06 DIAGNOSIS — E1149 Type 2 diabetes mellitus with other diabetic neurological complication: Secondary | ICD-10-CM | POA: Insufficient documentation

## 2017-06-06 DIAGNOSIS — E1142 Type 2 diabetes mellitus with diabetic polyneuropathy: Secondary | ICD-10-CM | POA: Diagnosis not present

## 2017-06-06 DIAGNOSIS — Z01818 Encounter for other preprocedural examination: Secondary | ICD-10-CM | POA: Diagnosis not present

## 2017-06-06 DIAGNOSIS — F410 Panic disorder [episodic paroxysmal anxiety] without agoraphobia: Secondary | ICD-10-CM | POA: Diagnosis not present

## 2017-06-06 DIAGNOSIS — E785 Hyperlipidemia, unspecified: Secondary | ICD-10-CM | POA: Insufficient documentation

## 2017-06-06 DIAGNOSIS — F3341 Major depressive disorder, recurrent, in partial remission: Secondary | ICD-10-CM | POA: Diagnosis not present

## 2017-06-06 DIAGNOSIS — K642 Third degree hemorrhoids: Secondary | ICD-10-CM | POA: Insufficient documentation

## 2017-06-06 DIAGNOSIS — M1990 Unspecified osteoarthritis, unspecified site: Secondary | ICD-10-CM | POA: Insufficient documentation

## 2017-06-06 DIAGNOSIS — Z01812 Encounter for preprocedural laboratory examination: Secondary | ICD-10-CM | POA: Insufficient documentation

## 2017-06-06 HISTORY — DX: Anemia, unspecified: D64.9

## 2017-06-06 HISTORY — DX: Essential (primary) hypertension: I10

## 2017-06-06 LAB — CBC
HCT: 30.1 % — ABNORMAL LOW (ref 40.0–52.0)
HEMOGLOBIN: 10.1 g/dL — AB (ref 13.0–18.0)
MCH: 31 pg (ref 26.0–34.0)
MCHC: 33.5 g/dL (ref 32.0–36.0)
MCV: 92.5 fL (ref 80.0–100.0)
Platelets: 300 10*3/uL (ref 150–440)
RBC: 3.25 MIL/uL — AB (ref 4.40–5.90)
RDW: 16.2 % — ABNORMAL HIGH (ref 11.5–14.5)
WBC: 7.2 10*3/uL (ref 3.8–10.6)

## 2017-06-06 LAB — URINALYSIS, ROUTINE W REFLEX MICROSCOPIC
BILIRUBIN URINE: NEGATIVE
HGB URINE DIPSTICK: NEGATIVE
Ketones, ur: NEGATIVE
LEUKOCYTES UA: NEGATIVE
NITRITE: NEGATIVE
RBC / HPF: NONE SEEN (ref 0–?)
Specific Gravity, Urine: 1.01 (ref 1.000–1.030)
Total Protein, Urine: NEGATIVE
URINE GLUCOSE: NEGATIVE
Urobilinogen, UA: 1 (ref 0.0–1.0)
pH: 7 (ref 5.0–8.0)

## 2017-06-06 NOTE — Telephone Encounter (Signed)
Hemoglobin up from before also--over 10 Hopefully he won't have more bleeding before his surgery scheduled next week

## 2017-06-06 NOTE — Pre-Procedure Instructions (Addendum)
Rechecked pt's BP and pulse, BP remains the same 86/47 and pulse 101.  Pt reports he feel dizzy.  Pt drinks 2 glasses of fluid per day, encouraged pt to drink 8 glasses of fluid per day.  Ginger Carne RN is calling Dr. Alla German office to see if wife can bring pt by his office for BP check and see if he should continue taking lisinopril.  Pt has been given 2 10 oz. bottles of water to drink.  Pt and wife is instructed to wait in waiting room for test results and instructions on what to do after.

## 2017-06-06 NOTE — Pre-Procedure Instructions (Addendum)
WIFE CONCERNED PATIENT HAS UTI. HIS COLOR IS VERY PALE. SPOKE WITH DR P CARROLL AND CBC/UA STAT. WILL HAVE PATIENT STAY UNTIL RESULTS. DR Jamal Collin IN OR TODAY. WIFE INFORMED AND WILL BE WAITING. HGB 10.1, COULD NOT VOID AFTER 3 GLASSES OF WATER. LAB RESULTS WITH V/S IN [place] WITH WIFE AND GOING STRAIGHT TO DR LETVAK"S. THEIR PHONE SYSTEM IS DOWN SO COULD NOT NOTIFY.

## 2017-06-06 NOTE — Discharge Summary (Signed)
Physician Discharge Summary  [name] TIR:[id number] DOB: 11/21/1940 DOA: 06/02/2017  PCP: Venia Carbon, MD  Admit date: 06/02/2017 Discharge date: 06/04/2017  Time spent: 35 minutes  Recommendations for Outpatient Follow-up:  1. Surgeon [place] next week, scheduled for hemorrhoidectomy 2. PCP Dr.Levtak in 1 week   Discharge Diagnoses:  Principal Problem:   Lower GI bleed   hemorrhoidal bleeding vs diverticular, likely former this time   Hypothyroidism   Type 2 diabetes mellitus with neurological manifestations, controlled ([place])   Panic anxiety syndrome   Diabetes, polyneuropathy (H[facility])   Spinal stenosis, lumbar region, with neurogenic claudication   GI bleed   Hemorrhagic shock ([place])   Discharge Condition: stable  Diet recommendation: DM/heart healthy  Filed Weights   06/02/17 0932 06/03/17 0247 06/03/17 1440  Weight: 88.5 kg (195 lb) 93 kg (205 lb 0.4 oz) 92.8 kg (204 lb 9.4 oz)    History of present illness:  76 year old male transferred from ICU service to [facility] 10/7, admitted with recurrent lower GI bleed secondary to hemorrhoids versus diverticulosis, he was scheduled for hemorrhoidectomy prior to admission.  Hospital Course:  1. Hemorrhagic shock - resolved - status post PRBC transfusion and fluid resuscitation -transferred from PCCM/ICU to [facility] service today   2. Lower GI bleed secondary to hemorrhoids versus diverticulosis -status post multiple hemorrhoidal bandings -was scheduled for hemorrhoidectomy and proctoscopy prior to admission by a surgeon in [place]. -Bleeding resolved now, not on anticoagulation or antiplatelet agents -Hemoglobin stable/improved, followed by GI and surgery this admission who think his bleeding now was likely due to hemorrhoids and recommended FU with surgeon in [place] next week for Elective hemorrhoidectomy as scheduled  3. Acute blood loss anemia -Status post 2 units of PRBC transfusion,  hemoglobin stable and improving - resume iron at discharge  4. Hypothyroidism -resumed Synthroid  5.   Type 2 diabetes mellitus with neurological manifestations, controlled ([place]) -resumed glipizide and metformin at discharge  6.  Panic anxiety syndrome -resumed Adderall and Zyprexa per home regimen    Consultations:  PCCM  [facility]  Discharge Exam: Vitals:   06/03/17 1440 06/03/17 2115  BP: (!) 118/57 117/63  Pulse: 85 78  Resp: 17 18  Temp: 98.8 F (37.1 C) 99 F (37.2 C)  SpO2: 93% 94%    General: AAOx3 Cardiovascular: S1S2/RRR Respiratory: CTAB  Discharge Instructions   Discharge Instructions    Diet - low sodium heart healthy    Complete by:  As directed    Increase activity slowly    Complete by:  As directed      Discharge Medication List as of 06/04/2017  3:40 PM    CONTINUE these medications which have NOT CHANGED   Details  atorvastatin (LIPITOR) 20 MG tablet TAKE 1 TABLET BY MOUTH ONCE DAILY, Normal    escitalopram (LEXAPRO) 20 MG tablet Take 1 tablet (20 mg total) by mouth every morning., Starting Wed 01/04/2017, No Print    Ferrous Sulfate (IRON SLOW RELEASE PO) Take 625 mg by mouth daily., Historical Med    gabapentin (NEURONTIN) 300 MG capsule TAKE 1 TO 2 CAPSULES BY MOUTH AT BEDTIME, Normal    glipiZIDE (GLUCOTROL) 5 MG tablet TAKE 1 TABLET BY MOUTH TWICE A DAY BEFORE A MEAL, Normal    hydrocortisone-pramoxine (ANALPRAM HC) 2.5-1 % rectal cream Place 1 application rectally 2 (two) times daily., Starting Thu 05/18/2017, Normal    levothyroxine (SYNTHROID, LEVOTHROID) 75 MCG tablet TAKE 1 TABLET BY MOUTH ONCE A DAY,  Normal    metFORMIN (GLUCOPHAGE) 500 MG tablet TAKE 1 TABLET BY MOUTH 3 TIMES DAILY, Normal    methylphenidate (RITALIN) 5 MG tablet Take 1 tablet (5 mg total) by mouth 2 (two) times daily., Starting Mon 03/27/2017, Print    OLANZapine (ZYPREXA) 10 MG tablet TAKE 1 TABLET BY MOUTH AT BEDTIME, Normal    vitamin B-12  (CYANOCOBALAMIN) 1000 MCG tablet Take 1,000 mcg by mouth daily.  , Historical Med    acetaminophen (TYLENOL) 650 MG CR tablet Take 650 mg by mouth as needed., Historical Med    polyethylene glycol (MIRALAX / GLYCOLAX) packet Take 17 g by mouth daily., Historical Med      STOP taking these medications     lisinopril (PRINIVIL,ZESTRIL) 10 MG tablet        Allergies  Allergen Reactions  . Vytorin [Ezetimibe-Simvastatin] Nausea Only    patient nor wife does not remember   Follow-up Information    Venia Carbon, MD. Schedule an appointment as soon as possible for a visit in 1 week(s).   Specialties:  Internal Medicine, Pediatrics Contact information: [address] [place] [address] [phone]        Surgeon in [place] Follow up.   Why:  Please call Monday to see if Surgery can be sooner than 17th           The results of significant diagnostics from this hospitalization (including imaging, microbiology, ancillary and laboratory) are listed below for reference.    Significant Diagnostic Studies: Dg Abd Portable 1v  Result Date: 06/02/2017 CLINICAL DATA:  Gastrointestinal bleeding EXAM: PORTABLE ABDOMEN - 1 VIEW COMPARISON:  None. FINDINGS: Bowel gas pattern does not show evidence of ileus or obstruction. No abnormal calcifications. Chronic spinal degenerative changes. IMPRESSION: Negative radiographs. No finding specific 2 gastrointestinal bleeding. Electronically Signed   By: Nelson Chimes M.D.   On: 06/02/2017 11:55    Microbiology: Recent Results (from the past 240 hour(s))  MRSA PCR Screening     Status: None   Collection Time: 06/02/17  9:36 PM  Result Value Ref Range Status   MRSA by PCR NEGATIVE NEGATIVE Final    Comment:        The GeneXpert MRSA Assay (FDA approved for NASAL specimens only), is one component of a comprehensive MRSA colonization surveillance program. It is not intended to diagnose MRSA infection nor to guide or monitor  treatment for MRSA infections.      Labs: Basic Metabolic Panel:  Recent Labs Lab 06/03/17 0033 06/03/17 0549 06/03/17 1143 06/03/17 2324 06/04/17 0653 06/04/17 1026  NA 137 138 136 136  --  134*  K 4.7 5.0 5.0 4.8  --  4.7  CL 110 108 107 103  --  102  CO2 22 23 25 27   --  25  GLUCOSE 109* 87 148* 136*  --  221*  BUN 29* 23* 19 16  --  12  CREATININE 1.58* 1.40* 1.19 1.21  --  1.16  CALCIUM 8.1* 8.3* 8.4* 8.4*  --  8.9  MG  --   --   --   --  1.6*  --   PHOS  --   --   --   --  2.5  --    Liver Function Tests:  Recent Labs Lab 06/02/17 0925  AST 32  ALT 21  ALKPHOS 58  BILITOT 0.8  PROT 6.1*  ALBUMIN 3.6   No results for input(s): LIPASE, AMYLASE in the last 168  hours. No results for input(s): AMMONIA in the last 168 hours. CBC:  Recent Labs Lab 06/02/17 1737  06/03/17 1756 06/03/17 2324 06/04/17 0653 06/04/17 1026 06/06/17 1031  WBC 8.2  < > 6.2 6.8 6.4 7.1 7.2  NEUTROABS 6.2  --   --   --   --   --   --   HGB 8.1*  < > 8.7* 8.2* 9.0* 9.7* 10.1*  HCT 24.5*  < > 27.3* 25.8* 28.3* 30.2* 30.1*  MCV 92.1  < > 92.5 93.1 93.7 93.5 92.5  PLT 243  < > 277 255 260 295 300  < > = values in this interval not displayed. Cardiac Enzymes:  Recent Labs Lab 06/02/17 1021  TROPONINI <0.03   BNP: BNP (last 3 results) No results for input(s): BNP in the last 8760 hours.  ProBNP (last 3 results) No results for input(s): PROBNP in the last 8760 hours.  CBG:  Recent Labs Lab 06/02/17 2031 06/03/17 0030 06/03/17 0815 06/03/17 1212 06/03/17 2327  GLUCAP 87 110* 89 140* 135*       SignedDomenic Polite MD.  [facility] 06/06/2017, 5:16 PM

## 2017-06-06 NOTE — Telephone Encounter (Signed)
Pt walked in with walker and his wife; pt had been at hospital for preop workup; pt could not void at hospital; pt got urine at [facility] and Terri in lab will send urine in to lab. Pts BP during preop was 90/40 P 101; pt was pale and lightheaded. Pt not drinking fluids. After pt got urine; pt said was not lightheaded when walking to rest room. Pt was sitting and I rechecked BP reg cuff lt arm 106/62 P 99. Pt has no complaints at this time. Dr Silvio Pate aware. Pt will go home and rest this afternoon and drink more fluids. pts wife called back and wants to know if pt should take lisinopril 10 mg today. Pt usually takes lisinopril around supper time. Mrs Cosma will not give lisinopril until gets cb.

## 2017-06-06 NOTE — Patient Instructions (Signed)
Your procedure is scheduled on: Wednesday Oct. 17, 2018. Report to Same Day surgery. To find out your arrival time please call [phone] between 1PM - 3PM on Tuesday Oct. 16, 2018.  Remember: Instructions that are not followed completely may result in serious medical risk, up to and including death, or upon the discretion of your surgeon and anesthesiologist your surgery may need to be rescheduled.     _X__ 1. Do not eat food after midnight the night before your procedure.                 No gum chewing or hard candies. You may drink clear liquids up to 2 hours                 before you are scheduled to arrive for your surgery- DO not drink clear                 liquids within 2 hours of the start of your surgery.                 Clear Liquids include:  water.     ___ 2.  No Alcohol for 24 hours before or after surgery.   ___ 3.  Do Not Smoke or use e-cigarettes For 24 Hours Prior to Your Surgery.                 Do not use any chewable tobacco products for at least 6 hours prior to                 surgery.  ____  4.  Bring all medications with you on the day of surgery if instructed.   _x___  5.  Notify your doctor if there is any change in your medical condition      (cold, fever, infections).     Do not wear jewelry, make-up, hairpins, clips or nail polish. Do not wear lotions, powders, or perfumes. You may wear deodorant. Do not shave 48 hours prior to surgery. Men may shave face and neck. Do not bring valuables to the hospital.    [facility] is not responsible for any belongings or valuables.  Contacts, dentures or bridgework may not be worn into surgery. Leave your suitcase in the car. After surgery it may be brought to your room. For patients admitted to the hospital, discharge time is determined by your treatment team.   Patients discharged the day of surgery will not be allowed to drive home.   Please read over the following fact sheets that  you were given:       __x__ Take these medicines the morning of surgery with A SIP OF WATER:    1. escitalopram (LEXAPRO)   2. levothyroxine (SYNTHROID, LEVOTHROID)    ____ Fleet Enema (as directed)   ____ Use CHG Soap as directed  ____ Use inhalers on the day of surgery  _x___ Stop metformin 2 days prior to surgery, last dose Sunday.    ____ Take 1/2 of usual insulin dose the night before surgery. No insulin the morning          of surgery.   ____ Stop Coumadin/Plavix/aspirin on does not apply.  ____ Stop Anti-inflammatories on does not apply.   ____ Stop supplements until after surgery.    ____ Bring C-Pap to the hospital.   __x_ Call Primary Doctor to check whether to continue Lisinopril due to low BP's.

## 2017-06-08 LAB — GLUCOSE, CAPILLARY
GLUCOSE-CAPILLARY: 67 mg/dL (ref 65–99)
GLUCOSE-CAPILLARY: 75 mg/dL (ref 65–99)

## 2017-06-14 ENCOUNTER — Encounter
Admission: RE | Disposition: A | Discharge status: Home / Self Care | Payer: Self-pay | Source: Ambulatory Visit | Attending: General Surgery

## 2017-06-14 ENCOUNTER — Ambulatory Visit: Payer: Medicare Other | Admitting: Anesthesiology

## 2017-06-14 ENCOUNTER — Encounter: Payer: Self-pay | Admitting: Emergency Medicine

## 2017-06-14 ENCOUNTER — Ambulatory Visit
Admission: RE | Admit: 2017-06-14 | Discharge: 2017-06-14 | Disposition: A | Discharge status: Home / Self Care | Payer: Medicare Other | Source: Ambulatory Visit | Attending: General Surgery | Admitting: General Surgery

## 2017-06-14 DIAGNOSIS — E785 Hyperlipidemia, unspecified: Secondary | ICD-10-CM | POA: Insufficient documentation

## 2017-06-14 DIAGNOSIS — E119 Type 2 diabetes mellitus without complications: Secondary | ICD-10-CM | POA: Insufficient documentation

## 2017-06-14 DIAGNOSIS — E039 Hypothyroidism, unspecified: Secondary | ICD-10-CM | POA: Insufficient documentation

## 2017-06-14 DIAGNOSIS — Z79899 Other long term (current) drug therapy: Secondary | ICD-10-CM | POA: Insufficient documentation

## 2017-06-14 DIAGNOSIS — F329 Major depressive disorder, single episode, unspecified: Secondary | ICD-10-CM | POA: Diagnosis not present

## 2017-06-14 DIAGNOSIS — Z7984 Long term (current) use of oral hypoglycemic drugs: Secondary | ICD-10-CM | POA: Insufficient documentation

## 2017-06-14 DIAGNOSIS — K648 Other hemorrhoids: Secondary | ICD-10-CM | POA: Diagnosis not present

## 2017-06-14 DIAGNOSIS — F419 Anxiety disorder, unspecified: Secondary | ICD-10-CM | POA: Insufficient documentation

## 2017-06-14 DIAGNOSIS — Z87891 Personal history of nicotine dependence: Secondary | ICD-10-CM | POA: Insufficient documentation

## 2017-06-14 DIAGNOSIS — K625 Hemorrhage of anus and rectum: Secondary | ICD-10-CM

## 2017-06-14 DIAGNOSIS — M1990 Unspecified osteoarthritis, unspecified site: Secondary | ICD-10-CM | POA: Diagnosis not present

## 2017-06-14 HISTORY — PX: PROCTOSCOPY: SHX2266

## 2017-06-14 HISTORY — PX: HEMORRHOID SURGERY: SHX153

## 2017-06-14 LAB — GLUCOSE, CAPILLARY
GLUCOSE-CAPILLARY: 172 mg/dL — AB (ref 65–99)
GLUCOSE-CAPILLARY: 127 mg/dL — AB (ref 65–99)

## 2017-06-14 SURGERY — HEMORRHOIDECTOMY
Anesthesia: General

## 2017-06-14 MED ORDER — TRAMADOL HCL 50 MG PO TABS: 50.0000 mg | ORAL_TABLET | Freq: Four times a day (QID) | ORAL | Status: DC

## 2017-06-14 MED ORDER — FAMOTIDINE 20 MG PO TABS
20.0000 mg | ORAL_TABLET | Freq: Once | ORAL | Status: AC
  Administered 2017-06-14: 20 mg via ORAL

## 2017-06-14 MED ORDER — GLYCOPYRROLATE 0.2 MG/ML IJ SOLN
INTRAMUSCULAR | Status: DC | PRN
  Administered 2017-06-14: 0.2 mg via INTRAVENOUS

## 2017-06-14 MED ORDER — BACITRACIN ZINC 500 UNIT/GM EX OINT
TOPICAL_OINTMENT | CUTANEOUS | Status: AC
  Filled 2017-06-14: qty 28.35

## 2017-06-14 MED ORDER — SODIUM CHLORIDE 0.9 % IV SOLN
INTRAVENOUS | Status: DC
  Administered 2017-06-14: 15:00:00 via INTRAVENOUS

## 2017-06-14 MED ORDER — PHENYLEPHRINE HCL 10 MG/ML IJ SOLN
INTRAMUSCULAR | Status: DC | PRN
  Administered 2017-06-14: 100 ug via INTRAVENOUS

## 2017-06-14 MED ORDER — ONDANSETRON HCL 4 MG/2ML IJ SOLN: 4.0000 mg | Freq: Once | INTRAMUSCULAR | Status: DC | PRN

## 2017-06-14 MED ORDER — FENTANYL CITRATE (PF) 100 MCG/2ML IJ SOLN: 25.0000 ug | INTRAMUSCULAR | Status: DC | PRN

## 2017-06-14 MED ORDER — ONDANSETRON HCL 4 MG/2ML IJ SOLN
INTRAMUSCULAR | Status: DC | PRN
  Administered 2017-06-14: 4 mg via INTRAVENOUS

## 2017-06-14 MED ORDER — BUPIVACAINE HCL (PF) 0.5 % IJ SOLN
INTRAMUSCULAR | Status: DC | PRN
  Administered 2017-06-14: 30 mL

## 2017-06-14 MED ORDER — EPHEDRINE SULFATE 50 MG/ML IJ SOLN
INTRAMUSCULAR | Status: DC | PRN
  Administered 2017-06-14: 10 mg via INTRAVENOUS

## 2017-06-14 MED ORDER — MIDAZOLAM HCL 2 MG/2ML IJ SOLN
INTRAMUSCULAR | Status: AC
  Filled 2017-06-14: qty 2

## 2017-06-14 MED ORDER — LIDOCAINE HCL (CARDIAC) 20 MG/ML IV SOLN: INTRAVENOUS | Status: DC | PRN

## 2017-06-14 MED ORDER — LIDOCAINE HCL (PF) 2 % IJ SOLN
INTRAMUSCULAR | Status: AC
  Filled 2017-06-14: qty 10

## 2017-06-14 MED ORDER — BUPIVACAINE HCL (PF) 0.5 % IJ SOLN
INTRAMUSCULAR | Status: AC
  Filled 2017-06-14: qty 30

## 2017-06-14 MED ORDER — FLEET ENEMA 7-19 GM/118ML RE ENEM: 1.0000 | ENEMA | Freq: Once | RECTAL | Status: DC

## 2017-06-14 MED ORDER — PROPOFOL 10 MG/ML IV BOLUS
INTRAVENOUS | Status: AC
  Filled 2017-06-14: qty 20

## 2017-06-14 MED ORDER — FENTANYL CITRATE (PF) 100 MCG/2ML IJ SOLN
INTRAMUSCULAR | Status: DC | PRN
  Administered 2017-06-14 (×3): 25 ug via INTRAVENOUS

## 2017-06-14 MED ORDER — TRAMADOL HCL 50 MG PO TABS: 50.0000 mg | ORAL_TABLET | Freq: Four times a day (QID) | ORAL | 0 refills | Status: DC | PRN

## 2017-06-14 MED ORDER — PROPOFOL 10 MG/ML IV BOLUS
INTRAVENOUS | Status: DC | PRN
  Administered 2017-06-14: 130 mg via INTRAVENOUS

## 2017-06-14 MED ORDER — ROCURONIUM BROMIDE 50 MG/5ML IV SOLN
INTRAVENOUS | Status: AC
  Filled 2017-06-14: qty 1

## 2017-06-14 MED ORDER — BUPIVACAINE LIPOSOME 1.3 % IJ SUSP
INTRAMUSCULAR | Status: AC
  Filled 2017-06-14: qty 20

## 2017-06-14 MED ORDER — LIDOCAINE HCL (CARDIAC) 20 MG/ML IV SOLN
INTRAVENOUS | Status: DC | PRN
  Administered 2017-06-14: 40 mg via INTRAVENOUS

## 2017-06-14 MED ORDER — FENTANYL CITRATE (PF) 100 MCG/2ML IJ SOLN
INTRAMUSCULAR | Status: AC
  Filled 2017-06-14: qty 2

## 2017-06-14 MED ORDER — FAMOTIDINE 20 MG PO TABS
ORAL_TABLET | ORAL | Status: AC
  Filled 2017-06-14: qty 1

## 2017-06-14 SURGICAL SUPPLY — 28 items
BLADE SURG 15 STRL SS SAFETY (BLADE) ×2 IMPLANT
BRIEF STRETCH MATERNITY 2XLG (MISCELLANEOUS) ×2 IMPLANT
CANISTER SUCT 1200ML W/VALVE (MISCELLANEOUS) ×2 IMPLANT
DRAPE LAPAROTOMY 77X122 PED (DRAPES) ×2 IMPLANT
DRAPE LEGGINS SURG 28X43 STRL (DRAPES) ×2 IMPLANT
DRAPE UNDER BUTTOCK W/FLU (DRAPES) ×2 IMPLANT
ELECT REM PT RETURN 9FT ADLT (ELECTROSURGICAL) ×2
ELECTRODE REM PT RTRN 9FT ADLT (ELECTROSURGICAL) ×1 IMPLANT
GLOVE BIO SURGEON STRL SZ7 (GLOVE) ×2 IMPLANT
GOWN STRL REUS W/ TWL LRG LVL3 (GOWN DISPOSABLE) ×2 IMPLANT
GOWN STRL REUS W/TWL LRG LVL3 (GOWN DISPOSABLE) ×4
KIT RM TURNOVER STRD PROC AR (KITS) ×2 IMPLANT
LABEL OR SOLS (LABEL) ×2 IMPLANT
LIGASURE IMPACT 36 18CM CVD LR (INSTRUMENTS) ×2 IMPLANT
NDL HYPO 25X1 1.5 SAFETY (NEEDLE) ×1 IMPLANT
NEEDLE HYPO 25X1 1.5 SAFETY (NEEDLE) ×2 IMPLANT
NS IRRIG 500ML POUR BTL (IV SOLUTION) ×2 IMPLANT
PACK BASIN MINOR ARMC (MISCELLANEOUS) ×2 IMPLANT
PAD OB MATERNITY 4.3X12.25 (PERSONAL CARE ITEMS) ×2 IMPLANT
PAD PREP 24X41 OB/GYN DISP (PERSONAL CARE ITEMS) ×2 IMPLANT
SOL PREP PVP 2OZ (MISCELLANEOUS) ×2
SOLUTION PREP PVP 2OZ (MISCELLANEOUS) ×1 IMPLANT
SURGILUBE 2OZ TUBE FLIPTOP (MISCELLANEOUS) ×2 IMPLANT
SUT MNCRL 3-0 UNDYED SH (SUTURE) ×1 IMPLANT
SUT MONOCRYL 3-0 UNDYED (SUTURE) ×1
SUT VIC AB 3-0 SH 27 (SUTURE) ×2
SUT VIC AB 3-0 SH 27X BRD (SUTURE) IMPLANT
SYR CONTROL 10ML (SYRINGE) ×2 IMPLANT

## 2017-06-14 NOTE — Anesthesia Preprocedure Evaluation (Signed)
Anesthesia Evaluation  Patient identified by MRN, date of birth, ID band Patient awake    Reviewed: Allergy & Precautions, NPO status , Patient's Chart, lab work & pertinent test results  History of Anesthesia Complications Negative for: history of anesthetic complications  Airway Mallampati: III       Dental  (+) Upper Dentures, Missing, Chipped   Pulmonary neg pulmonary ROS,           Cardiovascular hypertension, (-) angina(-) CAD, (-) Past MI, (-) Cardiac Stents and (-) CABG (-) dysrhythmias (-) Valvular Problems/Murmurs     Neuro/Psych PSYCHIATRIC DISORDERS  Neuromuscular disease (peripheral neuropathy)    GI/Hepatic negative GI ROS, Neg liver ROS,   Endo/Other  diabetes, Type 2, Oral Hypoglycemic AgentsHypothyroidism   Renal/GU negative Renal ROS     Musculoskeletal  (+) Arthritis ,   Abdominal   Peds  Hematology  (+) anemia ,   Anesthesia Other Findings Past Medical History: No date: Anxiety No date: Depression     Comment: recurrent and severe No date: 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 No date: GI bleed No date: HLD (hyperlipidemia) No date: Hypothyroidism No date: Internal hemorrhoid No date: Osteoarthritis 12/03/2012: Personal history of colonic adenoma   Reproductive/Obstetrics                             Anesthesia Physical  Anesthesia Plan  ASA: III and emergent  Anesthesia Plan: General   Post-op Pain Management:    Induction: Intravenous  PONV Risk Score and Plan: 2 and Ondansetron and Dexamethasone  Airway Management Planned: LMA  Additional Equipment:   Intra-op Plan:   Post-operative Plan: Extubation in OR  Informed Consent: I have reviewed the patients History and Physical, chart, labs and discussed the procedure including the risks, benefits and alternatives for the proposed anesthesia with the patient or authorized  representative who has indicated his/her understanding and acceptance.     Plan Discussed with:   Anesthesia Plan Comments:         Anesthesia Quick Evaluation

## 2017-06-14 NOTE — Anesthesia Procedure Notes (Signed)
Procedure Name: LMA Insertion Date/Time: 06/14/2017 3:30 PM Performed by: Allean Found Pre-anesthesia Checklist: Patient identified, Emergency Drugs available, Suction available, Patient being monitored and Timeout performed Patient Re-evaluated:Patient Re-evaluated prior to induction Oxygen Delivery Method: Circle system utilized Preoxygenation: Pre-oxygenation with 100% oxygen Induction Type: IV induction Ventilation: Mask ventilation without difficulty LMA: LMA inserted LMA Size: 5.0 Number of attempts: 1 Placement Confirmation: positive ETCO2 Tube secured with: Tape Dental Injury: Teeth and Oropharynx as per pre-operative assessment

## 2017-06-14 NOTE — Interval H&P Note (Signed)
History and Physical Interval Note:  06/14/2017 2:32 PM  Randy Mcdaniel.  has presented today for surgery, with the diagnosis of INTERNAL HEMORRHOIDS,RECTAL BLEEDING  The various methods of treatment have been discussed with the patient and family. After consideration of risks, benefits and other options for treatment, the patient has consented to  Procedure(s): HEMORRHOIDECTOMY (N/A) PROCTOSCOPY (N/A) as a surgical intervention .  The patient's history has been reviewed, patient examined, no change in status, stable for surgery.  I have reviewed the patient's chart and labs.  Questions were answered to the patient's satisfaction.     [name]

## 2017-06-14 NOTE — Anesthesia Postprocedure Evaluation (Signed)
Anesthesia Post Note  Patient: Randy Mcdaniel.  Procedure(s) Performed: HEMORRHOIDECTOMY (N/A ) PROCTOSCOPY (N/A )  Patient location during evaluation: PACU Anesthesia Type: General Level of consciousness: awake and alert Pain management: pain level controlled Vital Signs Assessment: post-procedure vital signs reviewed and stable Respiratory status: spontaneous breathing, nonlabored ventilation, respiratory function stable and patient connected to nasal cannula oxygen Cardiovascular status: blood pressure returned to baseline and stable Postop Assessment: no apparent nausea or vomiting Anesthetic complications: no     Last Vitals:  Vitals:   06/14/17 1701 06/14/17 1723  BP: (!) 130/57 134/74  Pulse: 81 80  Resp: 16   Temp: (!) 35.7 C   SpO2: 94% 96%    Last Pain:  Vitals:   06/14/17 1701  TempSrc: Temporal                 Martha Clan

## 2017-06-14 NOTE — Transfer of Care (Signed)
Immediate Anesthesia Transfer of Care Note  Patient: Randy Mcdaniel.  Procedure(s) Performed: HEMORRHOIDECTOMY (N/A ) PROCTOSCOPY (N/A )  Patient Location: PACU  Anesthesia Type:General  Level of Consciousness: awake  Airway & Oxygen Therapy: Patient Spontanous Breathing and Patient connected to face mask oxygen  Post-op Assessment: Report given to RN and Post -op Vital signs reviewed and stable  Post vital signs: Reviewed and stable  Last Vitals:  Vitals:   06/14/17 1351  BP: 120/60  Pulse: 80  Resp: 18  Temp: (!) 35.6 C  SpO2: 95%    Last Pain:  Vitals:   06/14/17 1351  TempSrc: Tympanic         Complications: No apparent anesthesia complications

## 2017-06-14 NOTE — Progress Notes (Signed)
Minimal drainage on peri pad 

## 2017-06-14 NOTE — Anesthesia Post-op Follow-up Note (Signed)
Anesthesia QCDR form completed.        

## 2017-06-14 NOTE — H&P (View-Only) (Signed)
Patient ID: Randy Land., male   DOB: Feb 09, 1941, 76 y.o.   MRN: [id number]  Chief Complaint  Patient presents with  . Follow-up    HPI Randy Mcdaniel Randy Mcdaniel. is a 76 y.o. male here today for internal hemorrhoids. Last visit he had a hemorrhoid banding done. He states he is still having some rectal bleeding.He received his iron transfusion. He is going to the [facility] tomorrow and will be seeing Dr. Tasia Mcdaniel on Monday. Wife is present with him today.   HPI  Past Medical History:  Diagnosis Date  . Anxiety   . Depression    recurrent and severe  . Diabetes mellitus    Type II with nephropathy  . GI bleed   . HLD (hyperlipidemia)   . Hypothyroidism   . Internal hemorrhoid   . Osteoarthritis   . Personal history of colonic adenoma 12/03/2012    Past Surgical History:  Procedure Laterality Date  . COLONOSCOPY    . COLONOSCOPY WITH PROPOFOL N/A 12/20/2016   Procedure: COLONOSCOPY WITH PROPOFOL;  Surgeon: Randy Bellows, MD;  Location: [facility] ENDOSCOPY;  Service: Endoscopy;  Laterality: N/A;  . ESOPHAGOGASTRODUODENOSCOPY (EGD) WITH PROPOFOL N/A 12/17/2016   Procedure: ESOPHAGOGASTRODUODENOSCOPY (EGD) WITH PROPOFOL;  Surgeon: Randy Corner, MD;  Location: [facility] ENDOSCOPY;  Service: Endoscopy;  Laterality: N/A;  . FLEXIBLE SIGMOIDOSCOPY N/A 12/17/2016   Procedure: FLEXIBLE SIGMOIDOSCOPY;  Surgeon: Randy Corner, MD;  Location: [facility] ENDOSCOPY;  Service: Endoscopy;  Laterality: N/A;  . HEMORRHOID BANDING  04/2017  . TOTAL KNEE ARTHROPLASTY  11/09   Right-Dr. Clair Mcdaniel ([facility])  . TOTAL KNEE ARTHROPLASTY  6/10   Left-Dr. Clair Mcdaniel ([facility])  . UMBILICAL HERNIA REPAIR  2000    Family History  Problem Relation Age of Onset  . Heart failure Father   . Diabetes Father   . Depression Father   . Coronary artery disease Father   . Breast cancer Sister 17  . Lung cancer Sister   . Depression Sister   . Stomach cancer Mother 24  . Stroke Unknown        Paternal side  . Parkinsonism Unknown         Cousin  . Colon cancer Neg Hx   . Rectal cancer Neg Hx   . Esophageal cancer Neg Hx   . Colon polyps Neg Hx     Social History Social History  Substance Use Topics  . Smoking status: Never Smoker  . Smokeless tobacco: Former Systems developer    Types: Chew    Quit date: 05/15/2002  . Alcohol use No    Allergies  Allergen Reactions  . Vytorin [Ezetimibe-Simvastatin]     REACTION: nausea    Current Outpatient Prescriptions  Medication Sig Dispense Refill  . acetaminophen (TYLENOL) 650 MG CR tablet Take 650 mg by mouth as needed.    Marland Kitchen atorvastatin (LIPITOR) 20 MG tablet TAKE 1 TABLET BY MOUTH ONCE DAILY 90 tablet 0  . escitalopram (LEXAPRO) 20 MG tablet Take 1 tablet (20 mg total) by mouth every morning. 1 tablet 0  . Ferrous Sulfate (IRON SLOW RELEASE PO) Take 625 mg by mouth daily.    Marland Kitchen gabapentin (NEURONTIN) 300 MG capsule TAKE 1 TO 2 CAPSULES BY MOUTH AT BEDTIME 60 capsule 11  . glipiZIDE (GLUCOTROL) 5 MG tablet TAKE 1 TABLET BY MOUTH TWICE A DAY BEFORE A MEAL 180 tablet 1  . glucose blood test strip Dx: 250.40 100 each 6  . hydrocortisone-pramoxine (ANALPRAM HC) 2.5-1 % rectal cream Place  1 application rectally 2 (two) times daily. 30 g 0  . levothyroxine (SYNTHROID, LEVOTHROID) 75 MCG tablet TAKE 1 TABLET BY MOUTH ONCE A DAY 90 tablet 3  . lisinopril (PRINIVIL,ZESTRIL) 10 MG tablet TAKE 1 TABLET BY MOUTH ONCE DAILY 90 tablet 1  . metFORMIN (GLUCOPHAGE) 500 MG tablet TAKE 1 TABLET BY MOUTH 3 TIMES DAILY 270 tablet 1  . methylphenidate (RITALIN) 5 MG tablet Take 1 tablet (5 mg total) by mouth 2 (two) times daily. 60 tablet 0  . OLANZapine (ZYPREXA) 10 MG tablet TAKE 1 TABLET BY MOUTH AT BEDTIME 30 tablet 5  . polyethylene glycol (MIRALAX / GLYCOLAX) packet Take 17 g by mouth daily.    . vitamin B-12 (CYANOCOBALAMIN) 1000 MCG tablet Take 1,000 mcg by mouth daily.       No current facility-administered medications for this visit.     Review of Systems Review of Systems    Constitutional: Negative.   Respiratory: Negative.   Cardiovascular: Negative.     Blood pressure (!) 100/52, pulse 66, resp. rate 16, height 5\' 10"  (1.778 m), weight 195 lb (88.5 kg).  Physical Exam Physical Exam  Constitutional: He is oriented to person, place, and time. He appears well-developed and well-nourished.  Eyes: Conjunctivae are normal. No scleral icterus.  Neck: Neck supple.  Cardiovascular: Normal rate, regular rhythm and normal heart sounds.   Pulmonary/Chest: Effort normal and breath sounds normal.  Abdominal: Soft. Bowel sounds are normal. He exhibits no distension and no mass. There is no tenderness.  Genitourinary: Rectal exam shows internal hemorrhoid.     Lymphadenopathy:    He has no cervical adenopathy.  Neurological: He is alert and oriented to person, place, and time.  Skin: Skin is warm and dry.  Psychiatric: He has a normal mood and affect. His behavior is normal.    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 Hgb on 9/17 was 9.9 and he is due for another redraw tomorrow. Most recent colonoscopy on 12/20/16 was benign for higher source of bleeding. Due to continued bleeding, will schedule for internal hemorrhoidectomy with patient and his wife's agreement.     Plan    Will schedule for internal hemorrhoidectomy and proctoscopy in the near future. Continue to follow up at the [facility] and with Dr. Tasia Mcdaniel to monitor iron deficiency anemia.   HPI, Physical Exam, Assessment and Plan have been scribed under the direction and in the presence of Randy Jewel, MD  Randy Mcdaniel, CMA     I have completed the exam and reviewed the above documentation for accuracy and completeness.  I agree with the above.  Haematologist has been  used and any errors in dictation or transcription are unintentional.  Randy Mcdaniel G. Jamal Collin, M.D., F.A.C.S.   Junie Panning G 06/01/2017, 9:14 AM  Patient's surgery has been scheduled for 06-14-17 at [facility].   Dominga Ferry, CMA

## 2017-06-14 NOTE — Progress Notes (Signed)
Sitz bath given to family with instructions

## 2017-06-14 NOTE — Op Note (Signed)
Preop diagnosis: Internal hemorrhoids with the episodes of severe bleeding  Post op diagnosis: Same  Operation: Proctoscopy and internal hemorrhoidectomy 3 columns  Surgeon: Mckinley Jewel  Assistant:     Anesthesia: Gen.  Complications: None  EBL: About the 15th 20 mL  Drains: None  Description: This patient has had a history of significant bleeding from hemorrhoids and has had requirements of transfusion several times including one in the recent 2 weeks. Patient has been followed by GI for several years and has had multiple hemorrhoid banding's but has continued to bleed. After evaluation in the office rest week and was decided to proceed with the proctoscopic exam and direct removal of the internal hemorrhoids. After the patient was put to sleep with an LMA was placed in lithotomy position in the anal area was prepped and draped as sterile field. Rigid proctoscopy was then performed to about 20-25 cm level. There was no abnormality noted in the rectum above the level of the internal hemorrhoids which were very prominent. The scope was then withdrawn and the hemorrhoids were easily identified as it tended to prolapse in addition to having noninvolved external hemorrhoid components. These hemorrhoids are fairly large measuring up to about 2 1/2-3 cm located at 3:00 7:00 and 11:00 location. With the speculum placed serially these hemorrhoids were removed with the use of the LigaSure device. Some oozing from the area was cauterized in the excision line was also reinforced with a running stitch of 3-0 Vicryl. 30 mL of 0.5% Marcaine was instilled in the perianal region and the intersphincteric groove for postop analgesia. The area was covered with bacitracin ointment and dressed. No immediate problems were encountered and patient was then sent returned recovery room stable condition.

## 2017-06-15 ENCOUNTER — Encounter: Payer: Self-pay | Admitting: General Surgery

## 2017-06-19 LAB — SURGICAL PATHOLOGY

## 2017-06-21 ENCOUNTER — Ambulatory Visit (INDEPENDENT_AMBULATORY_CARE_PROVIDER_SITE_OTHER): Payer: Medicare Other | Admitting: General Surgery

## 2017-06-21 ENCOUNTER — Encounter: Payer: Self-pay | Admitting: General Surgery

## 2017-06-21 VITALS — BP 130/82 | HR 69 | Resp 14 | Ht 68.0 in | Wt 202.0 lb

## 2017-06-21 DIAGNOSIS — K648 Other hemorrhoids: Secondary | ICD-10-CM

## 2017-06-21 NOTE — Progress Notes (Signed)
Patient ID: Randy Land., male   DOB: 1940/09/22, 76 y.o.   MRN: [id number]  Chief Complaint  Patient presents with  . Routine Post Op    HPI Randy Payer. is a 76 y.o. male here today for his post op hemorrhoidectomy done on 06/14/2017. He states he is still bleeding some but, not profusely.  HPI  Past Medical History:  Diagnosis Date  . Anemia   . Anxiety   . Dementia   . Depression    recurrent and severe  . Diabetes mellitus    Type II with nephropathy  . GI bleed   . HLD (hyperlipidemia)   . Hypertension   . Hypothyroidism   . Internal hemorrhoid   . Osteoarthritis   . Personal history of colonic adenoma 12/03/2012    Past Surgical History:  Procedure Laterality Date  . COLONOSCOPY    . COLONOSCOPY WITH PROPOFOL N/A 12/20/2016   Procedure: COLONOSCOPY WITH PROPOFOL;  Surgeon: Jonathon Bellows, MD;  Location: [facility] ENDOSCOPY;  Service: Endoscopy;  Laterality: N/A;  . ESOPHAGOGASTRODUODENOSCOPY (EGD) WITH PROPOFOL N/A 12/17/2016   Procedure: ESOPHAGOGASTRODUODENOSCOPY (EGD) WITH PROPOFOL;  Surgeon: Wilford Corner, MD;  Location: [facility] ENDOSCOPY;  Service: Endoscopy;  Laterality: N/A;  . FLEXIBLE SIGMOIDOSCOPY N/A 12/17/2016   Procedure: FLEXIBLE SIGMOIDOSCOPY;  Surgeon: Wilford Corner, MD;  Location: [facility] ENDOSCOPY;  Service: Endoscopy;  Laterality: N/A;  . HEMORRHOID BANDING  04/2017  . HEMORRHOID SURGERY N/A 06/14/2017   Procedure: HEMORRHOIDECTOMY;  Surgeon: Christene Lye, MD;  Location: [facility] ORS;  Service: General;  Laterality: N/A;  . HERNIA REPAIR    . JOINT REPLACEMENT Bilateral   . PROCTOSCOPY N/A 06/14/2017   Procedure: PROCTOSCOPY;  Surgeon: Christene Lye, MD;  Location: [facility] ORS;  Service: General;  Laterality: N/A;  . TOTAL KNEE ARTHROPLASTY  11/09   Right-Dr. Clair Gulling ([facility])  . TOTAL KNEE ARTHROPLASTY  6/10   Left-Dr. Clair Gulling ([facility])  . UMBILICAL HERNIA REPAIR  2000    Family History  Problem Relation Age of Onset  . Heart  failure Father   . Diabetes Father   . Depression Father   . Coronary artery disease Father   . Breast cancer Sister 12  . Lung cancer Sister   . Depression Sister   . Stomach cancer Mother 2  . Stroke Unknown        Paternal side  . Parkinsonism Unknown        Cousin  . Colon cancer Neg Hx   . Rectal cancer Neg Hx   . Esophageal cancer Neg Hx   . Colon polyps Neg Hx     Social History Social History  Substance Use Topics  . Smoking status: Never Smoker  . Smokeless tobacco: Former Systems developer    Types: Chew    Quit date: 05/15/2002  . Alcohol use No    Allergies  Allergen Reactions  . Vytorin [Ezetimibe-Simvastatin] Nausea Only    patient nor wife does not remember    Current Outpatient Prescriptions  Medication Sig Dispense Refill  . acetaminophen (TYLENOL) 650 MG CR tablet Take 650 mg by mouth every 8 (eight) hours as needed for pain.     Marland Kitchen atorvastatin (LIPITOR) 20 MG tablet TAKE 1 TABLET BY MOUTH ONCE DAILY (Patient taking differently: TAKE 20mg  BY MOUTH ONCE DAILY) 90 tablet 0  . escitalopram (LEXAPRO) 20 MG tablet Take 1 tablet (20 mg total) by mouth every morning. (Patient taking differently: Take 20 mg by mouth 2 (two) times  daily. ) 1 tablet 0  . Ferrous Sulfate (IRON SLOW RELEASE PO) Take 1 tablet by mouth daily.     Marland Kitchen gabapentin (NEURONTIN) 300 MG capsule TAKE 1 TO 2 CAPSULES BY MOUTH AT BEDTIME (Patient taking differently: TAKE 300-600mg  BY MOUTH AT BEDTIME) 60 capsule 11  . glipiZIDE (GLUCOTROL) 5 MG tablet TAKE 1 TABLET BY MOUTH TWICE A DAY BEFORE A MEAL (Patient taking differently: TAKE 5mg  BY MOUTH TWICE A DAY BEFORE A MEAL) 180 tablet 1  . hydrocortisone-pramoxine (ANALPRAM HC) 2.5-1 % rectal cream Place 1 application rectally 2 (two) times daily. (Patient taking differently: Place 1 application rectally 2 (two) times daily as needed. ) 30 g 0  . levothyroxine (SYNTHROID, LEVOTHROID) 75 MCG tablet TAKE 1 TABLET BY MOUTH ONCE A DAY (Patient taking differently:  TAKE 41mcg BY MOUTH ONCE A DAY) 90 tablet 3  . lisinopril (PRINIVIL,ZESTRIL) 10 MG tablet Take 10 mg by mouth daily.    . metFORMIN (GLUCOPHAGE) 500 MG tablet TAKE 1 TABLET BY MOUTH 3 TIMES DAILY (Patient taking differently: TAKE 500mg  BY MOUTH 3 TIMES DAILY) 270 tablet 1  . methylphenidate (RITALIN) 5 MG tablet Take 1 tablet (5 mg total) by mouth 2 (two) times daily. (Patient taking differently: Take 5 mg by mouth daily with lunch. ) 60 tablet 0  . OLANZapine (ZYPREXA) 10 MG tablet TAKE 1 TABLET BY MOUTH AT BEDTIME (Patient taking differently: TAKE 10mg  BY MOUTH AT BEDTIME) 30 tablet 5  . polyethylene glycol (MIRALAX / GLYCOLAX) packet Take 17 g by mouth daily as needed for moderate constipation.     . traMADol (ULTRAM) 50 MG tablet Take 1 tablet (50 mg total) by mouth every 6 (six) hours as needed. 20 tablet 0  . vitamin B-12 (CYANOCOBALAMIN) 1000 MCG tablet Take 1,000 mcg by mouth daily.       No current facility-administered medications for this visit.     Review of Systems Review of Systems  Constitutional: Negative.   Respiratory: Negative.   Cardiovascular: Negative.     Blood pressure 130/82, pulse 69, resp. rate 14, height 5\' 8"  (1.727 m), weight 202 lb (91.6 kg).  Physical Exam Physical Exam  Constitutional: He is oriented to person, place, and time. He appears well-developed and well-nourished.  Eyes: Conjunctivae are normal. No scleral icterus.  Neck: Neck supple.  Cardiovascular: Normal rate and regular rhythm.   Pulmonary/Chest: Effort normal and breath sounds normal.  Abdominal: Soft.  Genitourinary: Internal hemorrhoid: Healing well, no bleeding noted.     Neurological: He is alert and oriented to person, place, and time.  Skin: Skin is warm and dry.    Data Reviewed Op note and surgical pathology reviewed.  Surgical pathology- consistent with diagnosis, negative for dysplasia and malignancy.   Assessment    S/p hemorrhoidectomy 06/14/17- three column  hemorrhoidectomy at 3, 7, and 11 ocl. Incisions healing well. Good postoperative course.    Plan    Patient to return in three weeks. Pt will call for refills of topical cream if needed. The patient is aware to call back for any questions or concerns.      HPI, Physical Exam, Assessment and Plan have been scribed under the direction and in the presence of Mckinley Jewel, MD  Gaspar Cola, CMA   I have completed the exam and reviewed the above documentation for accuracy and completeness.  I agree with the above.  Haematologist has been used and any errors in dictation or transcription are unintentional.  Randy Mcdaniel  Robinette Haines, M.D., F.A.C.S.  Junie Panning Darnell Level 06/21/2017, 2:12 PM

## 2017-06-21 NOTE — Patient Instructions (Signed)
Patient to return in three weeks. The patient is aware to call back for any questions or concerns.

## 2017-07-03 ENCOUNTER — Encounter: Payer: Medicare Other | Admitting: Internal Medicine

## 2017-07-17 ENCOUNTER — Encounter: Payer: Self-pay | Admitting: General Surgery

## 2017-07-17 ENCOUNTER — Ambulatory Visit (INDEPENDENT_AMBULATORY_CARE_PROVIDER_SITE_OTHER): Payer: Medicare Other | Admitting: General Surgery

## 2017-07-17 VITALS — BP 138/80 | HR 98 | Resp 14 | Ht 68.0 in | Wt 202.0 lb

## 2017-07-17 DIAGNOSIS — K648 Other hemorrhoids: Secondary | ICD-10-CM

## 2017-07-17 NOTE — Patient Instructions (Addendum)
Patient to return in 2-3 weeks. Eat a diet with plenty of fiber.

## 2017-07-17 NOTE — Progress Notes (Signed)
Patient ID: Randy Mcdaniel., male   DOB: [birth year], 76 y.o.   MRN: [id number]  Chief Complaint  Patient presents with  . Follow-up    HPI Randy Mcdaniel Randy Mcdaniel. is a 76 y.o. male here today for his three week follow up internal hemorrhoids. He states he is doing well, minimal bleeding noticed.  HPI  Past Medical History:  Diagnosis Date  . Anemia   . Anxiety   . Dementia   . Depression    recurrent and severe  . Diabetes mellitus    Type II with nephropathy  . GI bleed   . HLD (hyperlipidemia)   . Hypertension   . Hypothyroidism   . Internal hemorrhoid   . Osteoarthritis   . Personal history of colonic adenoma 12/03/2012    Past Surgical History:  Procedure Laterality Date  . COLONOSCOPY    . COLONOSCOPY WITH PROPOFOL N/A 12/20/2016   Performed by Jonathon Bellows, MD at [facility]  . ESOPHAGOGASTRODUODENOSCOPY (EGD) WITH PROPOFOL N/A 12/17/2016   Performed by Wilford Corner, MD at [facility]  . FLEXIBLE SIGMOIDOSCOPY N/A 12/17/2016   Performed by Wilford Corner, MD at [facility]  . HEMORRHOID BANDING  04/2017  . HEMORRHOIDECTOMY N/A 06/14/2017   Performed by Christene Lye, MD at [facility] ORS  . HERNIA REPAIR    . JOINT REPLACEMENT Bilateral   . PROCTOSCOPY N/A 06/14/2017   Performed by Christene Lye, MD at [facility] ORS  . TOTAL KNEE ARTHROPLASTY  11/09   Right-Dr. Clair Gulling ([facility])  . TOTAL KNEE ARTHROPLASTY  6/10   Left-Dr. Clair Gulling ([facility])  . UMBILICAL HERNIA REPAIR  2000    Family History  Problem Relation Age of Onset  . Heart failure Father   . Diabetes Father   . Depression Father   . Coronary artery disease Father   . Breast cancer Sister 103  . Lung cancer Sister   . Depression Sister   . Stomach cancer Mother 47  . Stroke Unknown        Paternal side  . Parkinsonism Unknown        Cousin  . Colon cancer Neg Hx   . Rectal cancer Neg Hx   . Esophageal cancer Neg Hx   . Colon polyps Neg Hx     Social History Social  History   Tobacco Use  . Smoking status: Never Smoker  . Smokeless tobacco: Former Systems developer    Types: Chew  Substance Use Topics  . Alcohol use: No    Alcohol/week: 0.0 oz  . Drug use: No    Allergies  Allergen Reactions  . Vytorin [Ezetimibe-Simvastatin] Nausea Only    patient nor wife does not remember    Current Outpatient Medications  Medication Sig Dispense Refill  . acetaminophen (TYLENOL) 650 MG CR tablet Take 650 mg by mouth every 8 (eight) hours as needed for pain.     Marland Kitchen atorvastatin (LIPITOR) 20 MG tablet TAKE 1 TABLET BY MOUTH ONCE DAILY (Patient taking differently: TAKE 20mg  BY MOUTH ONCE DAILY) 90 tablet 0  . escitalopram (LEXAPRO) 20 MG tablet Take 1 tablet (20 mg total) by mouth every morning. (Patient taking differently: Take 20 mg by mouth 2 (two) times daily. ) 1 tablet 0  . Ferrous Sulfate (IRON SLOW RELEASE PO) Take 1 tablet by mouth daily.     Marland Kitchen gabapentin (NEURONTIN) 300 MG capsule TAKE 1 TO 2 CAPSULES BY MOUTH AT BEDTIME (Patient taking differently: TAKE 300-600mg  BY MOUTH AT  BEDTIME) 60 capsule 11  . glipiZIDE (GLUCOTROL) 5 MG tablet TAKE 1 TABLET BY MOUTH TWICE A DAY BEFORE A MEAL (Patient taking differently: TAKE 5mg  BY MOUTH TWICE A DAY BEFORE A MEAL) 180 tablet 1  . hydrocortisone-pramoxine (ANALPRAM HC) 2.5-1 % rectal cream Place 1 application rectally 2 (two) times daily. (Patient taking differently: Place 1 application rectally 2 (two) times daily as needed. ) 30 g 0  . levothyroxine (SYNTHROID, LEVOTHROID) 75 MCG tablet TAKE 1 TABLET BY MOUTH ONCE A DAY (Patient taking differently: TAKE 38mcg BY MOUTH ONCE A DAY) 90 tablet 3  . lisinopril (PRINIVIL,ZESTRIL) 10 MG tablet Take 10 mg by mouth daily.    . metFORMIN (GLUCOPHAGE) 500 MG tablet TAKE 1 TABLET BY MOUTH 3 TIMES DAILY (Patient taking differently: TAKE 500mg  BY MOUTH 3 TIMES DAILY) 270 tablet 1  . methylphenidate (RITALIN) 5 MG tablet Take 1 tablet (5 mg total) by mouth 2 (two) times daily. (Patient  taking differently: Take 5 mg by mouth daily with lunch. ) 60 tablet 0  . OLANZapine (ZYPREXA) 10 MG tablet TAKE 1 TABLET BY MOUTH AT BEDTIME (Patient taking differently: TAKE 10mg  BY MOUTH AT BEDTIME) 30 tablet 5  . polyethylene glycol (MIRALAX / GLYCOLAX) packet Take 17 g by mouth daily as needed for moderate constipation.     . traMADol (ULTRAM) 50 MG tablet Take 1 tablet (50 mg total) by mouth every 6 (six) hours as needed. 20 tablet 0  . vitamin B-12 (CYANOCOBALAMIN) 1000 MCG tablet Take 1,000 mcg by mouth daily.       No current facility-administered medications for this visit.     Review of Systems Review of Systems  Constitutional: Negative.   Respiratory: Negative.   Cardiovascular: Negative.   Gastrointestinal: Negative.     Blood pressure 138/80, pulse 98, resp. rate 14, height 5\' 8"  (1.727 m), weight 202 lb (91.6 kg).  Physical Exam Physical Exam  Constitutional: He is oriented to person, place, and time. He appears well-developed and well-nourished.  Genitourinary: Rectal exam shows internal hemorrhoid.     Neurological: He is alert and oriented to person, place, and time.  Skin: Skin is warm and dry.    Data Reviewed Prior notes reviewed   Assessment    S/p proctoscopy and 3 column internal hemorrhoidectomy 07/15/17. Hemorrhoidectomy sites healing well without signs of infection. One small hemorrhoid at 3ocl remaining. Will wait for others to heal before considering banding.     Plan      Patient to return in 2-3 weeks. Eat a diet with plenty of fiber.   HPI, Physical Exam, Assessment and Plan have been scribed under the direction and in the presence of Mckinley Jewel, MD  Gaspar Cola, CMA  I have completed the exam and reviewed the above documentation for accuracy and completeness.  I agree with the above.  Haematologist has been used and any errors in dictation or transcription are unintentional.  Seeplaputhur G. Jamal Collin, M.D.,  F.A.C.S.  Junie Panning G 07/17/2017, 10:34 AM

## 2017-07-21 ENCOUNTER — Encounter: Payer: Medicare Other | Admitting: Internal Medicine

## 2017-07-24 ENCOUNTER — Other Ambulatory Visit: Payer: Self-pay | Admitting: Internal Medicine

## 2017-07-28 ENCOUNTER — Encounter: Payer: Self-pay | Admitting: Internal Medicine

## 2017-07-28 ENCOUNTER — Ambulatory Visit: Payer: Medicare Other | Admitting: Internal Medicine

## 2017-07-28 VITALS — BP 126/70 | HR 96 | Temp 98.8°F | Ht 68.5 in | Wt 201.8 lb

## 2017-07-28 DIAGNOSIS — Z7189 Other specified counseling: Secondary | ICD-10-CM

## 2017-07-28 DIAGNOSIS — Z0001 Encounter for general adult medical examination with abnormal findings: Secondary | ICD-10-CM | POA: Diagnosis not present

## 2017-07-28 DIAGNOSIS — N401 Enlarged prostate with lower urinary tract symptoms: Secondary | ICD-10-CM | POA: Diagnosis not present

## 2017-07-28 DIAGNOSIS — F3341 Major depressive disorder, recurrent, in partial remission: Secondary | ICD-10-CM

## 2017-07-28 DIAGNOSIS — N138 Other obstructive and reflux uropathy: Secondary | ICD-10-CM | POA: Diagnosis not present

## 2017-07-28 DIAGNOSIS — E039 Hypothyroidism, unspecified: Secondary | ICD-10-CM

## 2017-07-28 DIAGNOSIS — Z0000 Encounter for general adult medical examination without abnormal findings: Secondary | ICD-10-CM

## 2017-07-28 DIAGNOSIS — E1149 Type 2 diabetes mellitus with other diabetic neurological complication: Secondary | ICD-10-CM

## 2017-07-28 DIAGNOSIS — N400 Enlarged prostate without lower urinary tract symptoms: Secondary | ICD-10-CM | POA: Insufficient documentation

## 2017-07-28 LAB — LIPID PANEL
CHOLESTEROL: 157 mg/dL (ref 0–200)
HDL: 45.4 mg/dL (ref >39.00)
LDL Cholesterol: 85 mg/dL (ref 0–99)
NonHDL: 111.39
TRIGLYCERIDES: 133 mg/dL (ref 0.0–149.0)
Total CHOL/HDL Ratio: 3
VLDL: 26.6 mg/dL (ref 0.0–40.0)

## 2017-07-28 LAB — COMPREHENSIVE METABOLIC PANEL
ALK PHOS: 74 U/L (ref 39–117)
ALT: 12 U/L (ref 0–53)
AST: 19 U/L (ref 0–37)
Albumin: 3.9 g/dL (ref 3.5–5.2)
BILIRUBIN TOTAL: 0.4 mg/dL (ref 0.2–1.2)
BUN: 21 mg/dL (ref 6–23)
CO2: 32 mEq/L (ref 19–32)
Calcium: 9.7 mg/dL (ref 8.4–10.5)
Chloride: 101 mEq/L (ref 96–112)
Creatinine, Ser: 1.12 mg/dL (ref 0.40–1.50)
GFR: 67.45 mL/min (ref >60.00)
GLUCOSE: 198 mg/dL — AB (ref 70–99)
Potassium: 4.6 mEq/L (ref 3.5–5.1)
SODIUM: 139 meq/L (ref 135–145)
TOTAL PROTEIN: 7 g/dL (ref 6.0–8.3)

## 2017-07-28 LAB — CBC
HEMATOCRIT: 38.4 % — AB (ref 39.0–52.0)
Hemoglobin: 12.4 g/dL — ABNORMAL LOW (ref 13.0–17.0)
MCHC: 32.3 g/dL (ref 30.0–36.0)
MCV: 91.2 fl (ref 78.0–100.0)
PLATELETS: 296 10*3/uL (ref 150.0–400.0)
RBC: 4.22 Mil/uL (ref 4.22–5.81)
RDW: 14.8 % (ref 11.5–15.5)
WBC: 7.6 10*3/uL (ref 4.0–10.5)

## 2017-07-28 LAB — T4, FREE: FREE T4: 0.72 ng/dL (ref 0.60–1.60)

## 2017-07-28 LAB — TSH: TSH: 3.67 u[IU]/mL (ref 0.35–4.50)

## 2017-07-28 LAB — HM DIABETES FOOT EXAM

## 2017-07-28 LAB — HEMOGLOBIN A1C: Hgb A1c MFr Bld: 6.9 % — ABNORMAL HIGH (ref 4.6–6.5)

## 2017-07-28 MED ORDER — METHYLPHENIDATE HCL 5 MG PO TABS: 5.0000 mg | ORAL_TABLET | Freq: Two times a day (BID) | ORAL | 0 refills | Status: DC

## 2017-07-28 MED ORDER — TETANUS-DIPHTHERIA TOXOIDS TD 5-2 LFU IM INJ: 0.5000 mL | INJECTION | Freq: Once | INTRAMUSCULAR | 0 refills | Status: AC

## 2017-07-28 MED ORDER — TAMSULOSIN HCL 0.4 MG PO CAPS: 0.4000 mg | ORAL_CAPSULE | Freq: Every day | ORAL | 3 refills | Status: DC

## 2017-07-28 NOTE — Assessment & Plan Note (Signed)
Mood is good with multiple meds Wean not appropriate

## 2017-07-28 NOTE — Assessment & Plan Note (Signed)
More symptomatic Will try tamsulosin

## 2017-07-28 NOTE — Assessment & Plan Note (Signed)
See social history 

## 2017-07-28 NOTE — Assessment & Plan Note (Signed)
Hopefully still good control Urged careful foot evaluation regularly

## 2017-07-28 NOTE — Assessment & Plan Note (Signed)
I have personally reviewed the Medicare Annual Wellness questionnaire and have noted 1. The patient's medical and social history 2. Their use of alcohol, tobacco or illicit drugs 3. Their current medications and supplements 4. The patient's functional ability including ADL's, fall risks, home safety risks and hearing or visual             impairment. 5. Diet and physical activities 6. Evidence for depression or mood disorders  The patients weight, height, BMI and visual acuity have been recorded in the chart I have made referrals, counseling and provided education to the patient based review of the above and I have provided the pt with a written personalized care plan for preventive services.  I have provided you with a copy of your personalized plan for preventive services. Please take the time to review along with your updated medication list.  No cancer screening due to age Discussed exercise--leg strengthening Td booster prescribed

## 2017-07-28 NOTE — Progress Notes (Signed)
Subjective:    Patient ID: Randy Land., male    DOB: 09-04-1940, 76 y.o.   MRN: [id number]  HPI Here for Medicare wellness visit and follow up of chronic health conditions Reviewed form and advanced directives Reviewed other doctors Wife is with him No alcohol or tobacco Doesn't exercise--not willing to start 1 fall without injury Chronic major depression Doesn't drive. Wife does all instruental ADLs. Wife does stand by help for showering. He does other ADLs No apparent memory problems  Bleeding is mostly gone Surgery seems to have helped Feels better now  Getting around with walker when out-- cane at home [place] once a while ago--no injury Able to get around better than last year  Not checking sugars No hypoglycemic reactions Ongoing numbness and pain in feet Gabapentin helps him sleep He is okay holding off on daytime Rx  Mood has been good Doesn't go out or do much still---but level mood  Not really anhedonic---does enjoy when he does go out  Slow urinary stream Has some leakage Nocturia x 2-3  No chest pain No SOB No dizziness or syncope No headache No edema Current Outpatient Medications on File Prior to Visit  Medication Sig Dispense Refill  . acetaminophen (TYLENOL) 650 MG CR tablet Take 650 mg by mouth every 8 (eight) hours as needed for pain.     Marland Kitchen atorvastatin (LIPITOR) 20 MG tablet TAKE 1 TABLET BY MOUTH ONCE DAILY (Patient taking differently: TAKE 20mg  BY MOUTH ONCE DAILY) 90 tablet 0  . escitalopram (LEXAPRO) 20 MG tablet Take 1 tablet (20 mg total) by mouth every morning. (Patient taking differently: Take 20 mg by mouth 2 (two) times daily. ) 1 tablet 0  . Ferrous Sulfate (IRON SLOW RELEASE PO) Take 1 tablet by mouth daily.     Marland Kitchen gabapentin (NEURONTIN) 300 MG capsule TAKE 1 TO 2 CAPSULES BY MOUTH AT BEDTIME (Patient taking differently: TAKE 300-600mg  BY MOUTH AT BEDTIME) 60 capsule 11  . glipiZIDE (GLUCOTROL) 5 MG tablet TAKE 1 TABLET BY  MOUTH TWICE A DAY BEFORE A MEAL (Patient taking differently: TAKE 5mg  BY MOUTH TWICE A DAY BEFORE A MEAL) 180 tablet 1  . hydrocortisone-pramoxine (ANALPRAM HC) 2.5-1 % rectal cream Place 1 application rectally 2 (two) times daily. (Patient taking differently: Place 1 application rectally 2 (two) times daily as needed. ) 30 g 0  . levothyroxine (SYNTHROID, LEVOTHROID) 75 MCG tablet TAKE 1 TABLET BY MOUTH ONCE A DAY (Patient taking differently: TAKE 82mcg BY MOUTH ONCE A DAY) 90 tablet 3  . lisinopril (PRINIVIL,ZESTRIL) 10 MG tablet Take 10 mg by mouth daily.    . metFORMIN (GLUCOPHAGE) 500 MG tablet TAKE 1 TABLET BY MOUTH 3 TIMES DAILY (Patient taking differently: TAKE 500mg  BY MOUTH 3 TIMES DAILY) 270 tablet 1  . methylphenidate (RITALIN) 5 MG tablet Take 1 tablet (5 mg total) by mouth 2 (two) times daily. (Patient taking differently: Take 5 mg by mouth daily with lunch. ) 60 tablet 0  . OLANZapine (ZYPREXA) 10 MG tablet TAKE 1 TABLET BY MOUTH EVERY NIGHT AT BEDTIME 30 tablet 0  . polyethylene glycol (MIRALAX / GLYCOLAX) packet Take 17 g by mouth daily as needed for moderate constipation.     . vitamin B-12 (CYANOCOBALAMIN) 1000 MCG tablet Take 1,000 mcg by mouth daily.       No current facility-administered medications on file prior to visit.     Allergies  Allergen Reactions  . Vytorin [Ezetimibe-Simvastatin] Nausea Only  patient nor wife does not remember    Past Medical History:  Diagnosis Date  . Anemia   . Anxiety   . Dementia   . Depression    recurrent and severe  . Diabetes mellitus    Type II with nephropathy  . GI bleed   . HLD (hyperlipidemia)   . Hypertension   . Hypothyroidism   . Internal hemorrhoid   . Osteoarthritis   . Personal history of colonic adenoma 12/03/2012    Past Surgical History:  Procedure Laterality Date  . COLONOSCOPY    . COLONOSCOPY WITH PROPOFOL N/A 12/20/2016   Procedure: COLONOSCOPY WITH PROPOFOL;  Surgeon: Jonathon Bellows, MD;  Location: [facility]  ENDOSCOPY;  Service: Endoscopy;  Laterality: N/A;  . ESOPHAGOGASTRODUODENOSCOPY (EGD) WITH PROPOFOL N/A 12/17/2016   Procedure: ESOPHAGOGASTRODUODENOSCOPY (EGD) WITH PROPOFOL;  Surgeon: Wilford Corner, MD;  Location[facility] ENDOSCOPY;  Service: Endoscopy;  Laterality: N/A;  . FLEXIBLE SIGMOIDOSCOPY N/A 12/17/2016   Procedure: FLEXIBLE SIGMOIDOSCOPY;  Surgeon: Wilford Corner, MD;  Location: [facility] ENDOSCOPY;  Service: Endoscopy;  Laterality: N/A;  . HEMORRHOID BANDING  04/2017  . HEMORRHOID SURGERY N/A 06/14/2017   Procedure: HEMORRHOIDECTOMY;  Surgeon: Christene Lye, MD;  Location: [facility] ORS;  Service: General;  Laterality: N/A;  . HERNIA REPAIR    . JOINT REPLACEMENT Bilateral   . PROCTOSCOPY N/A 06/14/2017   Procedure: PROCTOSCOPY;  Surgeon: Christene Lye, MD;  Location: [facility] ORS;  Service: General;  Laterality: N/A;  . TOTAL KNEE ARTHROPLASTY  11/09   Right-Dr. Clair Gulling ([facility])  . TOTAL KNEE ARTHROPLASTY  6/10   Left-Dr. Clair Gulling ([facility])  . UMBILICAL HERNIA REPAIR  2000    Family History  Problem Relation Age of Onset  . Heart failure Father   . Diabetes Father   . Depression Father   . Coronary artery disease Father   . Breast cancer Sister 20  . Lung cancer Sister   . Depression Sister   . Stomach cancer Mother 34  . Stroke Unknown        Paternal side  . Parkinsonism Unknown        Cousin  . Colon cancer Neg Hx   . Rectal cancer Neg Hx   . Esophageal cancer Neg Hx   . Colon polyps Neg Hx     Social History   Socioeconomic History  . Marital status: Married    Spouse name: Randy Mcdaniel  . Number of children: 2  . Years of education: Not on file  . Highest education level: Not on file  Social Needs  . Financial resource strain: Not on file  . Food insecurity - worry: Not on file  . Food insecurity - inability: Not on file  . Transportation needs - medical: Not on file  . Transportation needs - non-medical: Not on file  Occupational History  . Occupation:  Retired  Tobacco Use  . Smoking status: Never Smoker  . Smokeless tobacco: Former Systems developer    Types: Chew  Substance and Sexual Activity  . Alcohol use: No    Alcohol/week: 0.0 oz  . Drug use: No  . Sexual activity: Not on file  Other Topics Concern  . Not on file  Social History Narrative   Has living will.   No formal health care POA---requests wife to be. Daughter Randy Mcdaniel is alternate   Would accept resuscitation attempts   No tube feeds if cognitively unaware   Review of Systems Appetite is good Weight is stable Wears seat belt Teeth okay-- hasn't  been to dentist. Upper plate. No skin problems or suspicious lesions Constipated--every 2-3 days. Some straining. Rare reflux issues--no dysphagia     Objective:   Physical Exam  Constitutional: He is oriented to person, place, and time. No distress.  HENT:  Mouth/Throat: Oropharynx is clear and moist. No oropharyngeal exudate.  Neck: No thyromegaly present.  Cardiovascular: Normal rate, regular rhythm, normal heart sounds and intact distal pulses. Exam reveals no gallop.  No murmur heard. Pulmonary/Chest: Effort normal and breath sounds normal. No respiratory distress. He has no wheezes. He has no rales.  Abdominal: Soft. He exhibits no distension. There is no tenderness.  Musculoskeletal: He exhibits no edema or tenderness.  Lymphadenopathy:    He has no cervical adenopathy.  Neurological: He is alert and oriented to person, place, and time.  President-- "Daisy Floro, ?" [phone] [id number] Recall 1/3  Decreased sensation (very little) in feet  Skin: No rash noted. No erythema.  No foot lesions          Assessment & Plan:

## 2017-07-28 NOTE — Assessment & Plan Note (Signed)
Seems euthyroid ?Will check labs ?

## 2017-08-02 ENCOUNTER — Encounter: Payer: Self-pay | Admitting: General Surgery

## 2017-08-02 ENCOUNTER — Ambulatory Visit (INDEPENDENT_AMBULATORY_CARE_PROVIDER_SITE_OTHER): Payer: Medicare Other | Admitting: General Surgery

## 2017-08-02 VITALS — BP 144/70 | HR 72 | Resp 14 | Ht 68.0 in | Wt 201.0 lb

## 2017-08-02 DIAGNOSIS — K648 Other hemorrhoids: Secondary | ICD-10-CM

## 2017-08-02 NOTE — Patient Instructions (Signed)
The patient is aware to call back for any questions or concerns.  

## 2017-08-02 NOTE — Progress Notes (Signed)
Patient ID: Randy Land., male   DOB: [birth year], 76 y.o.   MRN: [id number]  Chief Complaint  Patient presents with  . Hemorrhoids    HPI Randy Metallo. is a 76 y.o. male       HPI patient is postop multiple internal hemorrhoid removal and currently is doing well with no bleeding reported.  Past Medical History:  Diagnosis Date  . Anemia   . Anxiety   . Dementia   . Depression    recurrent and severe  . Diabetes mellitus    Type II with nephropathy  . GI bleed   . HLD (hyperlipidemia)   . Hypertension   . Hypothyroidism   . Internal hemorrhoid   . Osteoarthritis   . Personal history of colonic adenoma 12/03/2012    Past Surgical History:  Procedure Laterality Date  . COLONOSCOPY    . COLONOSCOPY WITH PROPOFOL N/A 12/20/2016   Procedure: COLONOSCOPY WITH PROPOFOL;  Surgeon: Jonathon Bellows, MD;  Location[facility] ENDOSCOPY;  Service: Endoscopy;  Laterality: N/A;  . ESOPHAGOGASTRODUODENOSCOPY (EGD) WITH PROPOFOL N/A 12/17/2016   Procedure: ESOPHAGOGASTRODUODENOSCOPY (EGD) WITH PROPOFOL;  Surgeon: Wilford Corner, MD;  Location: [facility] ENDOSCOPY;  Service: Endoscopy;  Laterality: N/A;  . FLEXIBLE SIGMOIDOSCOPY N/A 12/17/2016   Procedure: FLEXIBLE SIGMOIDOSCOPY;  Surgeon: Wilford Corner, MD;  Location[facility] ENDOSCOPY;  Service: Endoscopy;  Laterality: N/A;  . HEMORRHOID BANDING  04/2017  . HEMORRHOID SURGERY N/A 06/14/2017   Procedure: HEMORRHOIDECTOMY;  Surgeon: Christene Lye, MD;  Location: [facility] ORS;  Service: General;  Laterality: N/A;  . HERNIA REPAIR    . JOINT REPLACEMENT Bilateral   . PROCTOSCOPY N/A 06/14/2017   Procedure: PROCTOSCOPY;  Surgeon: Christene Lye, MD;  Location: [facility] ORS;  Service: General;  Laterality: N/A;  . TOTAL KNEE ARTHROPLASTY  11/09   Right-Dr. Clair Gulling ([facility])  . TOTAL KNEE ARTHROPLASTY  6/10   Left-Dr. Clair Gulling ([facility])  . UMBILICAL HERNIA REPAIR  2000    Family History  Problem Relation Age of Onset  . Heart failure  Father   . Diabetes Father   . Depression Father   . Coronary artery disease Father   . Breast cancer Sister 33  . Lung cancer Sister   . Depression Sister   . Stomach cancer Mother 66  . Stroke Unknown        Paternal side  . Parkinsonism Unknown        Cousin  . Colon cancer Neg Hx   . Rectal cancer Neg Hx   . Esophageal cancer Neg Hx   . Colon polyps Neg Hx     Social History Social History   Tobacco Use  . Smoking status: Never Smoker  . Smokeless tobacco: Former Systems developer    Types: Chew  Substance Use Topics  . Alcohol use: No    Alcohol/week: 0.0 oz  . Drug use: No    Allergies  Allergen Reactions  . Vytorin [Ezetimibe-Simvastatin] Nausea Only    patient nor wife does not remember    Current Outpatient Medications  Medication Sig Dispense Refill  . acetaminophen (TYLENOL) 650 MG CR tablet Take 650 mg by mouth every 8 (eight) hours as needed for pain.     Marland Kitchen atorvastatin (LIPITOR) 20 MG tablet TAKE 1 TABLET BY MOUTH ONCE DAILY (Patient taking differently: TAKE 20mg  BY MOUTH ONCE DAILY) 90 tablet 0  . escitalopram (LEXAPRO) 20 MG tablet Take 1 tablet (20 mg total) by mouth every morning. (Patient taking differently:  Take 20 mg by mouth 2 (two) times daily. ) 1 tablet 0  . Ferrous Sulfate (IRON SLOW RELEASE PO) Take 1 tablet by mouth daily.     Marland Kitchen gabapentin (NEURONTIN) 300 MG capsule TAKE 1 TO 2 CAPSULES BY MOUTH AT BEDTIME (Patient taking differently: TAKE 300-600mg  BY MOUTH AT BEDTIME) 60 capsule 11  . glipiZIDE (GLUCOTROL) 5 MG tablet TAKE 1 TABLET BY MOUTH TWICE A DAY BEFORE A MEAL (Patient taking differently: TAKE 5mg  BY MOUTH TWICE A DAY BEFORE A MEAL) 180 tablet 1  . hydrocortisone-pramoxine (ANALPRAM HC) 2.5-1 % rectal cream Place 1 application rectally 2 (two) times daily. (Patient taking differently: Place 1 application rectally 2 (two) times daily as needed. ) 30 g 0  . levothyroxine (SYNTHROID, LEVOTHROID) 75 MCG tablet TAKE 1 TABLET BY MOUTH ONCE A DAY (Patient  taking differently: TAKE 74mcg BY MOUTH ONCE A DAY) 90 tablet 3  . lisinopril (PRINIVIL,ZESTRIL) 10 MG tablet Take 10 mg by mouth daily.    . metFORMIN (GLUCOPHAGE) 500 MG tablet TAKE 1 TABLET BY MOUTH 3 TIMES DAILY (Patient taking differently: TAKE 500mg  BY MOUTH 3 TIMES DAILY) 270 tablet 1  . methylphenidate (RITALIN) 5 MG tablet Take 1 tablet (5 mg total) by mouth 2 (two) times daily. 60 tablet 0  . OLANZapine (ZYPREXA) 10 MG tablet TAKE 1 TABLET BY MOUTH EVERY NIGHT AT BEDTIME 30 tablet 0  . polyethylene glycol (MIRALAX / GLYCOLAX) packet Take 17 g by mouth daily as needed for moderate constipation.     . tamsulosin (FLOMAX) 0.4 MG CAPS capsule Take 1 capsule (0.4 mg total) by mouth daily. 90 capsule 3  . vitamin B-12 (CYANOCOBALAMIN) 1000 MCG tablet Take 1,000 mcg by mouth daily.       No current facility-administered medications for this visit.     Review of Systems Review of Systems  Constitutional: Negative.   Respiratory: Negative.   Cardiovascular: Negative.     Blood pressure (!) 144/70, pulse 72, resp. rate 14, height 5\' 8"  (1.727 m), weight 201 lb (91.2 kg).  Physical Exam Physical Exam  Constitutional: He is oriented to person, place, and time. He appears well-developed and well-nourished.  Genitourinary: Rectal exam shows internal hemorrhoid.  Genitourinary Comments: One internal hemorrhoid remains, left side 3 o'clock, unchanged from prior exam.  Neurological: He is alert and oriented to person, place, and time.  Skin: Skin is warm.  Psychiatric: His behavior is normal.    Data Reviewed Prior notes reviewed   Assessment    Internal hemorrhoid post excision of multiple hemorrhoids. Bleeding has resolved.  He does have one small internal hemorrhoid at 3:00 which is not symptomatic    Plan    Follow up as needed. The patient is aware to call back for any questions or new concerns.       HPI, Physical Exam, Assessment and Plan have been scribed under the  direction and in the presence of Randy Jewel, MD  Gaspar Cola, CMA I have completed the exam and reviewed the above documentation for accuracy and completeness.  I agree with the above.  Randy Mcdaniel has been used and any errors in dictation or transcription are unintentional.  Lurdes Haltiwanger G. Jamal Collin, M.D., F.A.C.S.  Junie Panning G 08/11/2017, 10:59 AM

## 2017-08-14 ENCOUNTER — Other Ambulatory Visit: Payer: Self-pay | Admitting: Internal Medicine

## 2017-08-28 ENCOUNTER — Other Ambulatory Visit: Payer: Self-pay | Admitting: Internal Medicine

## 2017-08-30 NOTE — Telephone Encounter (Signed)
Pt is out of medication and calling to check on refill status

## 2017-09-25 ENCOUNTER — Other Ambulatory Visit: Payer: Self-pay | Admitting: Internal Medicine

## 2017-09-25 MED ORDER — METHYLPHENIDATE HCL 5 MG PO TABS: 5.0000 mg | ORAL_TABLET | Freq: Two times a day (BID) | ORAL | 0 refills | Status: DC

## 2017-09-25 NOTE — Telephone Encounter (Signed)
Last written 07-28-17 #60 Last OV 07-28-17 Next OV 01-26-18

## 2017-09-25 NOTE — Telephone Encounter (Signed)
Spoke to pt's wife per DPR that rx is was sent to the pharmacy

## 2017-09-25 NOTE — Telephone Encounter (Signed)
Copied from [place]. Topic: Quick Communication - Rx Refill/Question >> Sep 25, 2017  9:28 AM Lolita Rieger, RMA wrote: Medication: ritalin 5 mg   Has the patient contacted their pharmacy? no   (Agent: If no, request that the patient contact the pharmacy for the refill.)   Preferred Pharmacy (with phone number or street name): [facility]: Please be advised that RX refills may take up to 3 business days. We ask that you follow-up with your pharmacy.

## 2017-10-03 ENCOUNTER — Other Ambulatory Visit: Payer: Self-pay | Admitting: Internal Medicine

## 2017-11-16 ENCOUNTER — Ambulatory Visit: Payer: Self-pay | Admitting: *Deleted

## 2017-11-16 ENCOUNTER — Telehealth: Payer: Self-pay | Admitting: Internal Medicine

## 2017-11-16 NOTE — Telephone Encounter (Signed)
I triaged Randy Mcdaniel.

## 2017-11-16 NOTE — Telephone Encounter (Signed)
Attempted to return his call.

## 2017-11-16 NOTE — Telephone Encounter (Signed)
His wife called in first and gave me the number to call him back at because she is at work.   [phone].   (We have been playing phone tag this morning; he calls and does not answer the phone, we leave a message, he calls back and we are busy on the phone).     He answered and c/o having diarrhea since Sunday morning.   He has taken some Imodium which has slowed it down some but not made it go away.  He c/o being weak.  I have made him an appt with Dr. Silvio Pate (he insisted he would only see Dr. Silvio Pate) for 11/17/17 at 9:15 on 11/17/17.    He is going to check with his wife and see if this works.   If he does not I instructed him to either he or his wife to call us back and reschedule.   He agreed to this plan.    Reason for Disposition . [1] MODERATE diarrhea (e.g., 4-6 times / day more than normal) AND [2] age > 70 years  Answer Assessment - Initial Assessment Questions 1. DIARRHEA SEVERITY: "How bad is the diarrhea?" "How many extra stools have you had in the past 24 hours than normal?"    - MILD: Few loose or mushy BMs; increase of 1-3 stools over normal daily number of stools; mild increase in ostomy output.   - MODERATE: Increase of 4-6 stools daily over normal; moderate increase in ostomy output.   - SEVERE (or Worst Possible): Increase of 7 or more stools daily over normal; moderate increase in ostomy output; incontinence.    I've been 3 times this morning 2. ONSET: "When did the diarrhea begin?"      Sunday morning off and on since then 3. BM CONSISTENCY: "How loose or watery is the diarrhea?"      Loose more than mushy 4. VOMITING: "Are you also vomiting?" If so, ask: "How many times in the past 24 hours?"      I took some Imodium.   It slowed it down some but still have it.   No vomiting. 5. ABDOMINAL PAIN: "Are you having any abdominal pain?" If yes: "What does it feel like?" (e.g., crampy, dull, intermittent, constant)      No pain 6. ABDOMINAL PAIN SEVERITY: If present, ask:  "How bad is the pain?"  (e.g., Scale 1-10; mild, moderate, or severe)    - MILD (1-3): doesn't interfere with normal activities, abdomen soft and not tender to touch     - MODERATE (4-7): interferes with normal activities or awakens from sleep, tender to touch     - SEVERE (8-10): excruciating pain, doubled over, unable to do any normal activities       None 7. ORAL INTAKE: If vomiting, "Have you been able to drink liquids?" "How much fluids have you had in the past 24 hours?"     I'm eating fine.   I drank a Diet Coke and Gator aid.   8. HYDRATION: "Any signs of dehydration?" (e.g., dry mouth [not just dry lips], too weak to stand, dizziness, new weight loss) "When did you last urinate?"     I feel weak but not dizzy. 9. EXPOSURE: "Have you traveled to a foreign country recently?" "Have you been exposed to anyone with diarrhea?" "Could you have eaten any food that was spoiled?"     No exposures.    I woke up with it Sunday morning.    No new medications  or change in diet lately. 10. OTHER SYMPTOMS: "Do you have any other symptoms?" (e.g., fever, blood in stool)       Brown loose stools. 11. PREGNANCY: "Is there any chance you are pregnant?" "When was your last menstrual period?"       N/A  Protocols used: DIARRHEA-A-AH

## 2017-11-16 NOTE — Telephone Encounter (Signed)
Okay I will plan to check him in the morning--doesn't sound like an emergency

## 2017-11-16 NOTE — Telephone Encounter (Signed)
Copied from [place]. Topic: General - Other >> Nov 16, 2017 12:22 PM Carolyn Stare wrote:  Pt returned a triage call back . Triage line was busy

## 2017-11-17 ENCOUNTER — Ambulatory Visit: Payer: Medicare Other | Admitting: Internal Medicine

## 2017-11-17 ENCOUNTER — Ambulatory Visit (INDEPENDENT_AMBULATORY_CARE_PROVIDER_SITE_OTHER)
Admission: RE | Admit: 2017-11-17 | Discharge: 2017-11-17 | Disposition: A | Discharge status: Home / Self Care | Payer: Medicare Other | Source: Ambulatory Visit | Attending: Internal Medicine | Admitting: Internal Medicine

## 2017-11-17 ENCOUNTER — Encounter: Payer: Self-pay | Admitting: Internal Medicine

## 2017-11-17 VITALS — BP 94/60 | HR 101 | Temp 98.3°F | Ht 68.0 in | Wt 198.0 lb

## 2017-11-17 DIAGNOSIS — R197 Diarrhea, unspecified: Secondary | ICD-10-CM

## 2017-11-17 NOTE — Patient Instructions (Signed)
Please stop the miralax.  Take senna -s 2 tabs twice a day. If no good stool in 2-3 days, try milk of magnesia, dulcolax pills or an enema. email me with concerns and progress

## 2017-11-17 NOTE — Assessment & Plan Note (Addendum)
Clearly not infectious Not sure if he could be having an overflow phenomenon---will check KUB Shows plenty of stool but no obstructive symptoms Will stop the miralax--I don't think he likes it Regular senna- s---then dulcolax prn

## 2017-11-17 NOTE — Progress Notes (Signed)
Subjective:    Patient ID: Randy Mcdaniel., male    DOB: [birth year], 77 y.o.   MRN: [id number]  HPI Here with wife due to diarrhea Has been constant since 5 days ago Going 3-4 times a day--small quantities Watery with clumps Had been using miralax --stopped it 5 days ago (then things got worse)  Has had troubles before--never tried off metformin  No set pain but has gas No N/V  Current Outpatient Medications on File Prior to Visit  Medication Sig Dispense Refill  . acetaminophen (TYLENOL) 650 MG CR tablet Take 650 mg by mouth every 8 (eight) hours as needed for pain.     Marland Kitchen atorvastatin (LIPITOR) 20 MG tablet TAKE 1 TABLET BY MOUTH ONCE DAILY 90 tablet 3  . escitalopram (LEXAPRO) 20 MG tablet Take 1 tablet (20 mg total) by mouth every morning. (Patient taking differently: Take 20 mg by mouth 2 (two) times daily. ) 1 tablet 0  . Ferrous Sulfate (IRON SLOW RELEASE PO) Take 1 tablet by mouth every other day.     . gabapentin (NEURONTIN) 300 MG capsule TAKE 1 TO 2 CAPSULES BY MOUTH AT BEDTIME (Patient taking differently: TAKE 300-600mg  BY MOUTH AT BEDTIME) 60 capsule 11  . glipiZIDE (GLUCOTROL) 5 MG tablet TAKE 1 TABLET BY MOUTH TWICE A DAY BEFORE A MEAL (Patient taking differently: TAKE 5mg  BY MOUTH TWICE A DAY BEFORE A MEAL) 180 tablet 1  . hydrocortisone-pramoxine (ANALPRAM HC) 2.5-1 % rectal cream Place 1 application rectally 2 (two) times daily. (Patient taking differently: Place 1 application rectally 2 (two) times daily as needed. ) 30 g 0  . levothyroxine (SYNTHROID, LEVOTHROID) 75 MCG tablet TAKE 1 TABLET BY MOUTH ONCE A DAY (Patient taking differently: TAKE 10mcg BY MOUTH ONCE A DAY) 90 tablet 3  . lisinopril (PRINIVIL,ZESTRIL) 10 MG tablet Take 10 mg by mouth daily.    . metFORMIN (GLUCOPHAGE) 500 MG tablet TAKE 1 TABLET BY MOUTH 3 TIMES DAILY (Patient taking differently: TAKE 500mg  BY MOUTH 3 TIMES DAILY) 270 tablet 1  . methylphenidate (RITALIN) 5 MG tablet Take 1 tablet (5  mg total) by mouth 2 (two) times daily. 60 tablet 0  . OLANZapine (ZYPREXA) 10 MG tablet TAKE 1 TABLET BY MOUTH EVERY NIGHT AT BEDTIME 30 tablet 0  . polyethylene glycol (MIRALAX / GLYCOLAX) packet Take 17 g by mouth daily as needed for moderate constipation.     . vitamin B-12 (CYANOCOBALAMIN) 1000 MCG tablet Take 1,000 mcg by mouth daily.       No current facility-administered medications on file prior to visit.     Allergies  Allergen Reactions  . Vytorin [Ezetimibe-Simvastatin] Nausea Only    patient nor wife does not remember    Past Medical History:  Diagnosis Date  . Anemia   . Anxiety   . Dementia   . Depression    recurrent and severe  . Diabetes mellitus    Type II with nephropathy  . GI bleed   . HLD (hyperlipidemia)   . Hypertension   . Hypothyroidism   . Internal hemorrhoid   . Osteoarthritis   . Personal history of colonic adenoma 12/03/2012    Past Surgical History:  Procedure Laterality Date  . COLONOSCOPY    . COLONOSCOPY WITH PROPOFOL N/A 12/20/2016   Procedure: COLONOSCOPY WITH PROPOFOL;  Surgeon: Jonathon Bellows, MD;  Location: [facility] ENDOSCOPY;  Service: Endoscopy;  Laterality: N/A;  . ESOPHAGOGASTRODUODENOSCOPY (EGD) WITH PROPOFOL N/A 12/17/2016   Procedure: ESOPHAGOGASTRODUODENOSCOPY (EGD)  WITH PROPOFOL;  Surgeon: Wilford Corner, MD;  Location: [facility] ENDOSCOPY;  Service: Endoscopy;  Laterality: N/A;  . FLEXIBLE SIGMOIDOSCOPY N/A 12/17/2016   Procedure: FLEXIBLE SIGMOIDOSCOPY;  Surgeon: Wilford Corner, MD;  Location: [facility] ENDOSCOPY;  Service: Endoscopy;  Laterality: N/A;  . HEMORRHOID BANDING  04/2017  . HEMORRHOID SURGERY N/A 06/14/2017   Procedure: HEMORRHOIDECTOMY;  Surgeon: Christene Lye, MD;  Location: [facility] ORS;  Service: General;  Laterality: N/A;  . HERNIA REPAIR    . JOINT REPLACEMENT Bilateral   . PROCTOSCOPY N/A 06/14/2017   Procedure: PROCTOSCOPY;  Surgeon: Christene Lye, MD;  Location: [facility] ORS;  Service: General;  Laterality:  N/A;  . TOTAL KNEE ARTHROPLASTY  11/09   Right-Dr. Clair Gulling ([facility])  . TOTAL KNEE ARTHROPLASTY  6/10   Left-Dr. Clair Gulling ([facility])  . UMBILICAL HERNIA REPAIR  2000    Family History  Problem Relation Age of Onset  . Heart failure Father   . Diabetes Father   . Depression Father   . Coronary artery disease Father   . Breast cancer Sister 23  . Lung cancer Sister   . Depression Sister   . Stomach cancer Mother 81  . Stroke Unknown        Paternal side  . Parkinsonism Unknown        Cousin  . Colon cancer Neg Hx   . Rectal cancer Neg Hx   . Esophageal cancer Neg Hx   . Colon polyps Neg Hx     Social History   Socioeconomic History  . Marital status: Married    Spouse name: [name]  . Number of children: 2  . Years of education: Not on file  . Highest education level: Not on file  Occupational History  . Occupation: Retired  Scientific laboratory technician  . Financial resource strain: Not on file  . Food insecurity:    Worry: Not on file    Inability: Not on file  . Transportation needs:    Medical: Not on file    Non-medical: Not on file  Tobacco Use  . Smoking status: Never Smoker  . Smokeless tobacco: Former Systems developer    Types: Chew  Substance and Sexual Activity  . Alcohol use: No    Alcohol/week: 0.0 oz  . Drug use: No  . Sexual activity: Not on file  Lifestyle  . Physical activity:    Days per week: Not on file    Minutes per session: Not on file  . Stress: Not on file  Relationships  . Social connections:    Talks on phone: Not on file    Gets together: Not on file    Attends religious service: Not on file    Active member of club or organization: Not on file    Attends meetings of clubs or organizations: Not on file    Relationship status: Not on file  . Intimate partner violence:    Fear of current or ex partner: Not on file    Emotionally abused: Not on file    Physically abused: Not on file    Forced sexual activity: Not on file  Other Topics Concern  . Not on file    Social History Narrative   Has living will.   No formal health care POA---requests wife to be. Daughter [name] is alternate   Would accept resuscitation attempts   No tube feeds if cognitively unaware   Review of Systems Appetite is off Weight down a few pounds No cough or SOB  Objective:   Physical Exam  Constitutional: He appears well-developed. No distress.  Pulmonary/Chest: Effort normal and breath sounds normal. No respiratory distress. He has no wheezes. He has no rales.  Abdominal: Soft. Bowel sounds are normal. He exhibits no distension and no mass. There is no tenderness. There is no rebound and no guarding.          Assessment & Plan:

## 2017-11-20 ENCOUNTER — Other Ambulatory Visit: Payer: Self-pay | Admitting: Internal Medicine

## 2017-11-27 ENCOUNTER — Other Ambulatory Visit: Payer: Self-pay | Admitting: Internal Medicine

## 2017-11-27 NOTE — Telephone Encounter (Signed)
Last filled 09-25-17 #60 Last OV 11-17-17 Next OV 01-26-18

## 2017-12-25 ENCOUNTER — Other Ambulatory Visit: Payer: Self-pay | Admitting: Internal Medicine

## 2018-01-26 ENCOUNTER — Encounter: Payer: Self-pay | Admitting: Internal Medicine

## 2018-01-26 ENCOUNTER — Ambulatory Visit: Payer: Medicare Other | Admitting: Internal Medicine

## 2018-01-26 ENCOUNTER — Other Ambulatory Visit: Payer: Self-pay | Admitting: Internal Medicine

## 2018-01-26 VITALS — BP 110/68 | HR 90 | Temp 98.2°F | Ht 68.0 in | Wt 199.0 lb

## 2018-01-26 DIAGNOSIS — R0989 Other specified symptoms and signs involving the circulatory and respiratory systems: Secondary | ICD-10-CM

## 2018-01-26 DIAGNOSIS — N401 Enlarged prostate with lower urinary tract symptoms: Secondary | ICD-10-CM

## 2018-01-26 DIAGNOSIS — N138 Other obstructive and reflux uropathy: Secondary | ICD-10-CM | POA: Diagnosis not present

## 2018-01-26 DIAGNOSIS — K591 Functional diarrhea: Secondary | ICD-10-CM

## 2018-01-26 DIAGNOSIS — E1149 Type 2 diabetes mellitus with other diabetic neurological complication: Secondary | ICD-10-CM | POA: Diagnosis not present

## 2018-01-26 DIAGNOSIS — F3341 Major depressive disorder, recurrent, in partial remission: Secondary | ICD-10-CM | POA: Diagnosis not present

## 2018-01-26 LAB — POCT GLYCOSYLATED HEMOGLOBIN (HGB A1C): Hemoglobin A1C: 7 % — AB (ref 4.0–5.6)

## 2018-01-26 NOTE — Patient Instructions (Signed)
Please increase the gabapentin 300mg  to 3 capsules at bedtime. If the pain is not better, you can even increase to 4 capsules (after a week)---let me know how that works out (and I can send a new prescription for the changed dose)

## 2018-01-26 NOTE — Assessment & Plan Note (Signed)
Voids okay 

## 2018-01-26 NOTE — Assessment & Plan Note (Signed)
Doesn't check but control is good Lab Results  Component Value Date   HGBA1C 7.0 (A) 01/26/2018   Increased neuropathy pain Will try increasing gabapentin Pedal pulses not palpable--will check ABI

## 2018-01-26 NOTE — Progress Notes (Signed)
Subjective:    Patient ID: Randy Livings., male    DOB: [birth year], 77 y.o.   MRN: [id number]  HPI Here with wife for follow up of diabetes and other medical conditions  Hs diarrhea is better Didn't have to stop metformin Stopped miralax and now just on senna  Not checking sugars No apparent problems Foot neuropathy pain is worse---keeping him up at night  Mood is better Depression is quiet now Wife notes he always has some down times--but nothing worrisome  No chest pain No SOB No dizziness or syncope  Current Outpatient Medications on File Prior to Visit  Medication Sig Dispense Refill  . acetaminophen (TYLENOL) 650 MG CR tablet Take 650 mg by mouth every 8 (eight) hours as needed for pain.     Marland Kitchen atorvastatin (LIPITOR) 20 MG tablet TAKE 1 TABLET BY MOUTH ONCE DAILY 90 tablet 3  . escitalopram (LEXAPRO) 20 MG tablet Take 1 tablet (20 mg total) by mouth every morning. (Patient taking differently: Take 20 mg by mouth 2 (two) times daily. ) 1 tablet 0  . Ferrous Sulfate (IRON SLOW RELEASE PO) Take 1 tablet by mouth every other day.     . gabapentin (NEURONTIN) 300 MG capsule TAKE 1 TO 2 CAPSULES BY MOUTH AT BEDTIME 60 capsule 6  . glipiZIDE (GLUCOTROL) 5 MG tablet TAKE 1 TABLET BY MOUTH TWICE A DAY BEFORE A MEAL 180 tablet 3  . hydrocortisone-pramoxine (ANALPRAM HC) 2.5-1 % rectal cream Place 1 application rectally 2 (two) times daily. (Patient taking differently: Place 1 application rectally 2 (two) times daily as needed. ) 30 g 0  . levothyroxine (SYNTHROID, LEVOTHROID) 75 MCG tablet TAKE 68mcg BY MOUTH ONCE A DAY 90 tablet 3  . lisinopril (PRINIVIL,ZESTRIL) 10 MG tablet Take 10 mg by mouth daily.    . metFORMIN (GLUCOPHAGE) 500 MG tablet TAKE 1 TABLET BY MOUTH 3 TIMES DAILY (Patient taking differently: TAKE 500mg  BY MOUTH 3 TIMES DAILY) 270 tablet 1  . methylphenidate (RITALIN) 5 MG tablet TAKE 1 TABLET BY MOUTH TWICE (2) DAILY 60 tablet 0  . OLANZapine (ZYPREXA) 10 MG  tablet TAKE 1 TABLET BY MOUTH EVERY NIGHT AT BEDTIME 30 tablet 0  . sennosides-docusate sodium (SENOKOT-S) 8.6-50 MG tablet Take 2 tablets by mouth daily as needed.     . vitamin B-12 (CYANOCOBALAMIN) 1000 MCG tablet Take 1,000 mcg by mouth daily.       No current facility-administered medications on file prior to visit.     Allergies  Allergen Reactions  . Vytorin [Ezetimibe-Simvastatin] Nausea Only    patient nor wife does not remember    Past Medical History:  Diagnosis Date  . Anemia   . Anxiety   . Dementia   . Depression    recurrent and severe  . Diabetes mellitus    Type II with nephropathy  . GI bleed   . HLD (hyperlipidemia)   . Hypertension   . Hypothyroidism   . Internal hemorrhoid   . Osteoarthritis   . Personal history of colonic adenoma 12/03/2012    Past Surgical History:  Procedure Laterality Date  . COLONOSCOPY    . COLONOSCOPY WITH PROPOFOL N/A 12/20/2016   Procedure: COLONOSCOPY WITH PROPOFOL;  Surgeon: Jonathon Bellows, MD;  Location[facility] ENDOSCOPY;  Service: Endoscopy;  Laterality: N/A;  . ESOPHAGOGASTRODUODENOSCOPY (EGD) WITH PROPOFOL N/A 12/17/2016   Procedure: ESOPHAGOGASTRODUODENOSCOPY (EGD) WITH PROPOFOL;  Surgeon: Wilford Corner, MD;  Location: [facility] ENDOSCOPY;  Service: Endoscopy;  Laterality: N/A;  .  FLEXIBLE SIGMOIDOSCOPY N/A 12/17/2016   Procedure: FLEXIBLE SIGMOIDOSCOPY;  Surgeon: Wilford Corner, MD;  Location: [facility] ENDOSCOPY;  Service: Endoscopy;  Laterality: N/A;  . HEMORRHOID BANDING  04/2017  . HEMORRHOID SURGERY N/A 06/14/2017   Procedure: HEMORRHOIDECTOMY;  Surgeon: Christene Lye, MD;  Location: [facility] ORS;  Service: General;  Laterality: N/A;  . HERNIA REPAIR    . JOINT REPLACEMENT Bilateral   . PROCTOSCOPY N/A 06/14/2017   Procedure: PROCTOSCOPY;  Surgeon: Christene Lye, MD;  Location: [facility] ORS;  Service: General;  Laterality: N/A;  . TOTAL KNEE ARTHROPLASTY  11/09   Right-Dr. Clair Gulling ([facility])  . TOTAL KNEE ARTHROPLASTY   6/10   Left-Dr. Clair Gulling ([facility])  . UMBILICAL HERNIA REPAIR  2000    Family History  Problem Relation Age of Onset  . Heart failure Father   . Diabetes Father   . Depression Father   . Coronary artery disease Father   . Breast cancer Sister 59  . Lung cancer Sister   . Depression Sister   . Stomach cancer Mother 63  . Stroke Unknown        Paternal side  . Parkinsonism Unknown        Cousin  . Colon cancer Neg Hx   . Rectal cancer Neg Hx   . Esophageal cancer Neg Hx   . Colon polyps Neg Hx     Social History   Socioeconomic History  . Marital status: Married    Spouse name: [name]  . Number of children: 2  . Years of education: Not on file  . Highest education level: Not on file  Occupational History  . Occupation: Retired  Scientific laboratory technician  . Financial resource strain: Not on file  . Food insecurity:    Worry: Not on file    Inability: Not on file  . Transportation needs:    Medical: Not on file    Non-medical: Not on file  Tobacco Use  . Smoking status: Never Smoker  . Smokeless tobacco: Former Systems developer    Types: Chew  Substance and Sexual Activity  . Alcohol use: No    Alcohol/week: 0.0 oz  . Drug use: No  . Sexual activity: Not on file  Lifestyle  . Physical activity:    Days per week: Not on file    Minutes per session: Not on file  . Stress: Not on file  Relationships  . Social connections:    Talks on phone: Not on file    Gets together: Not on file    Attends religious service: Not on file    Active member of club or organization: Not on file    Attends meetings of clubs or organizations: Not on file    Relationship status: Not on file  . Intimate partner violence:    Fear of current or ex partner: Not on file    Emotionally abused: Not on file    Physically abused: Not on file    Forced sexual activity: Not on file  Other Topics Concern  . Not on file  Social History Narrative   Has living will.   No formal health care POA---requests wife to be.  Daughter [name] is alternate   Would accept resuscitation attempts   No tube feeds if cognitively unaware   Review of Systems Will sleep in chair after getting up --if he has not slept well Appetite is fine Weight is stable Uses cane in house, walker when out Voids okay    Objective:  Physical Exam  Constitutional: He appears well-developed. No distress.  Neck: No thyromegaly present.  Cardiovascular: Normal rate, regular rhythm and normal heart sounds. Exam reveals no gallop.  No murmur heard. Pedal pulses not palpable  Respiratory: No respiratory distress. He has no wheezes.  Slight dry crackles right base  Musculoskeletal: He exhibits no edema.  Lymphadenopathy:    He has no cervical adenopathy.  Skin:  Feet purplish but no ulcers  Psychiatric: He has a normal mood and affect. His behavior is normal.           Assessment & Plan:

## 2018-01-26 NOTE — Assessment & Plan Note (Signed)
Doing okay on medication

## 2018-01-26 NOTE — Assessment & Plan Note (Signed)
Better now 

## 2018-01-29 ENCOUNTER — Other Ambulatory Visit: Payer: Self-pay | Admitting: Internal Medicine

## 2018-01-29 NOTE — Telephone Encounter (Signed)
Last filled 11-27-17 #60 Last OV 01-26-18 Next OV 08-08-18

## 2018-01-31 ENCOUNTER — Ambulatory Visit (INDEPENDENT_AMBULATORY_CARE_PROVIDER_SITE_OTHER): Payer: Medicare Other

## 2018-01-31 DIAGNOSIS — R0989 Other specified symptoms and signs involving the circulatory and respiratory systems: Secondary | ICD-10-CM | POA: Diagnosis not present

## 2018-02-02 LAB — HM DIABETES EYE EXAM

## 2018-02-03 ENCOUNTER — Encounter: Payer: Self-pay | Admitting: Internal Medicine

## 2018-02-05 MED ORDER — GABAPENTIN 300 MG PO CAPS: 900.0000 mg | ORAL_CAPSULE | Freq: Every day | ORAL | 11 refills | Status: DC

## 2018-02-06 ENCOUNTER — Encounter: Payer: Self-pay | Admitting: Internal Medicine

## 2018-02-19 ENCOUNTER — Other Ambulatory Visit: Payer: Self-pay | Admitting: Internal Medicine

## 2018-02-27 ENCOUNTER — Other Ambulatory Visit: Payer: Self-pay | Admitting: Internal Medicine

## 2018-03-26 ENCOUNTER — Other Ambulatory Visit: Payer: Self-pay | Admitting: Internal Medicine

## 2018-03-26 NOTE — Telephone Encounter (Signed)
Last filled 01-29-18 #60 Last OV 01-26-18 Next OV 08-08-18  [place]

## 2018-04-17 ENCOUNTER — Other Ambulatory Visit: Payer: Self-pay | Admitting: Internal Medicine

## 2018-04-17 NOTE — Telephone Encounter (Signed)
Spoke to pt. He said he is taking the lisinopril.

## 2018-05-22 ENCOUNTER — Other Ambulatory Visit: Payer: Self-pay | Admitting: Internal Medicine

## 2018-05-22 NOTE — Telephone Encounter (Signed)
Last filled 03-26-18 #60 Last OV 01-26-18 Next OV 08-08-18

## 2018-07-20 IMAGING — DX DG ABD PORTABLE 1V
2 series · 2 of 2 positions shown · non-contrast
Comparison: None.

CLINICAL DATA: Gastrointestinal bleeding

EXAM:
PORTABLE ABDOMEN - 1 VIEW

[abdomen kub (1 of 2)]
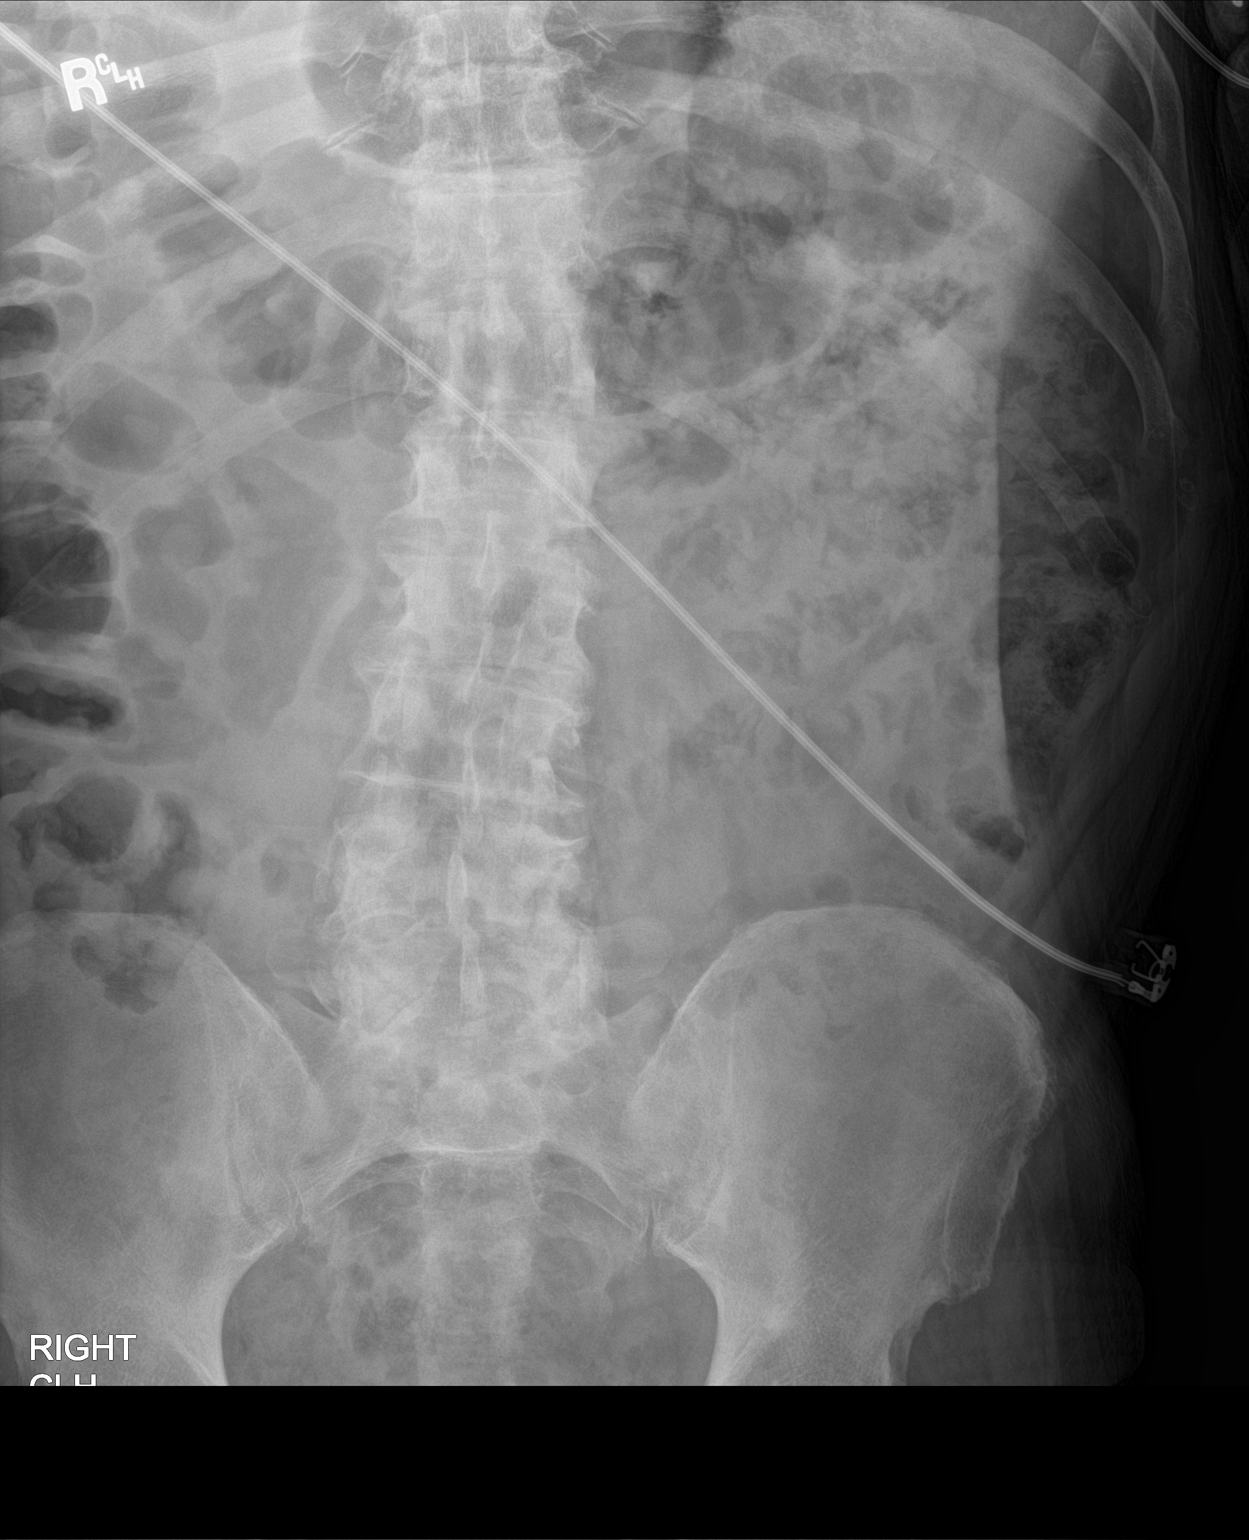

[abdomen kub (2 of 2)]
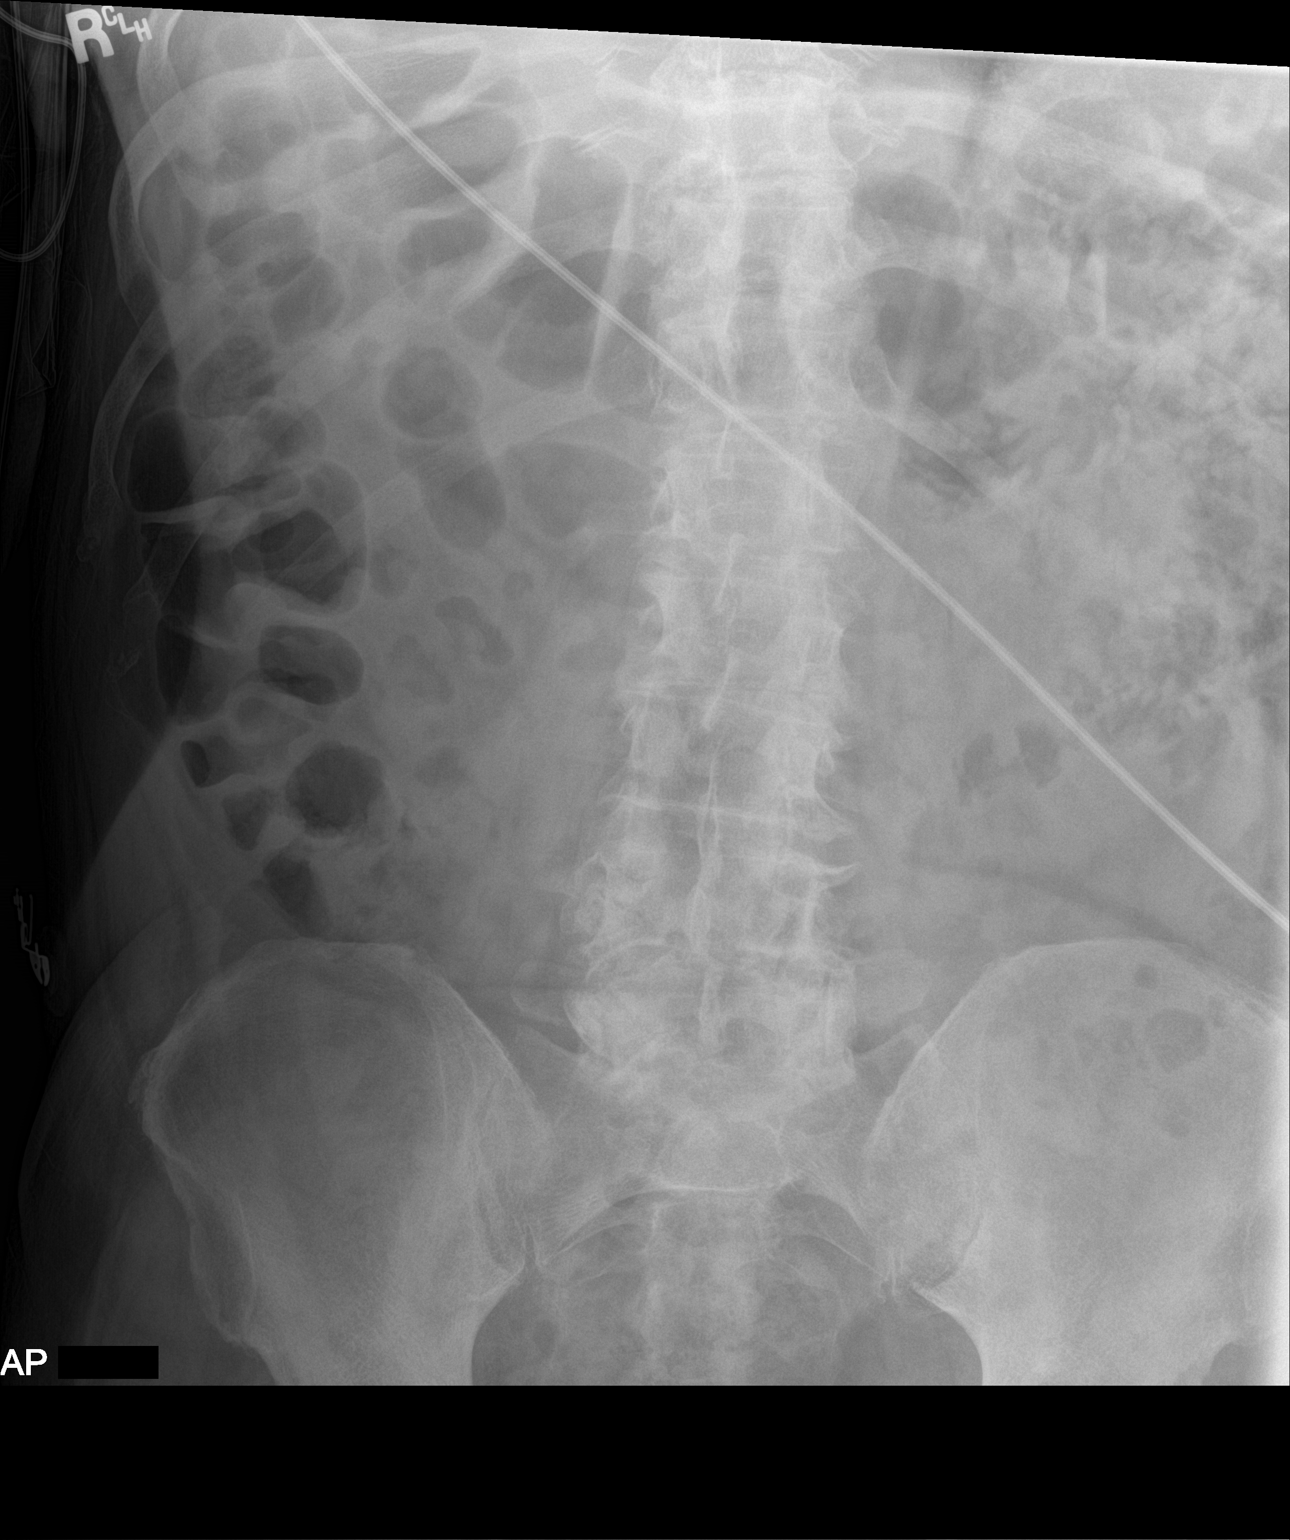

[2 of 2 positions shown; findings below may reference images not displayed]

FINDINGS: Bowel gas pattern does not show evidence of ileus or obstruction. No
abnormal calcifications. Chronic spinal degenerative changes.
IMPRESSION: Negative radiographs. No finding specific 2 gastrointestinal
bleeding.

## 2018-07-23 ENCOUNTER — Other Ambulatory Visit: Payer: Self-pay | Admitting: Internal Medicine

## 2018-07-23 NOTE — Telephone Encounter (Signed)
Last filled 05-22-18 #60 Last OV 01-26-18 Next OV 08-08-18 [place]

## 2018-08-08 ENCOUNTER — Encounter: Payer: Self-pay | Admitting: Internal Medicine

## 2018-08-08 ENCOUNTER — Ambulatory Visit (INDEPENDENT_AMBULATORY_CARE_PROVIDER_SITE_OTHER): Payer: Medicare Other | Admitting: Internal Medicine

## 2018-08-08 VITALS — BP 118/70 | HR 99 | Temp 98.5°F | Ht 69.0 in | Wt 197.0 lb

## 2018-08-08 DIAGNOSIS — Z0000 Encounter for general adult medical examination without abnormal findings: Secondary | ICD-10-CM

## 2018-08-08 DIAGNOSIS — Z7189 Other specified counseling: Secondary | ICD-10-CM | POA: Diagnosis not present

## 2018-08-08 DIAGNOSIS — E1149 Type 2 diabetes mellitus with other diabetic neurological complication: Secondary | ICD-10-CM

## 2018-08-08 DIAGNOSIS — E039 Hypothyroidism, unspecified: Secondary | ICD-10-CM

## 2018-08-08 DIAGNOSIS — N1831 Chronic kidney disease, stage 3a: Secondary | ICD-10-CM | POA: Insufficient documentation

## 2018-08-08 DIAGNOSIS — N183 Chronic kidney disease, stage 3 unspecified: Secondary | ICD-10-CM | POA: Insufficient documentation

## 2018-08-08 DIAGNOSIS — F3341 Major depressive disorder, recurrent, in partial remission: Secondary | ICD-10-CM

## 2018-08-08 DIAGNOSIS — Z23 Encounter for immunization: Secondary | ICD-10-CM

## 2018-08-08 LAB — LIPID PANEL
Cholesterol: 152 mg/dL (ref 0–200)
HDL: 50.9 mg/dL (ref >39.00)
LDL Cholesterol: 74 mg/dL (ref 0–99)
NonHDL: 100.72
Total CHOL/HDL Ratio: 3
Triglycerides: 132 mg/dL (ref 0.0–149.0)
VLDL: 26.4 mg/dL (ref 0.0–40.0)

## 2018-08-08 LAB — COMPREHENSIVE METABOLIC PANEL
ALT: 11 U/L (ref 0–53)
AST: 22 U/L (ref 0–37)
Albumin: 4.4 g/dL (ref 3.5–5.2)
Alkaline Phosphatase: 64 U/L (ref 39–117)
BUN: 30 mg/dL — AB (ref 6–23)
CALCIUM: 10 mg/dL (ref 8.4–10.5)
CO2: 26 mEq/L (ref 19–32)
CREATININE: 1.28 mg/dL (ref 0.40–1.50)
Chloride: 104 mEq/L (ref 96–112)
GFR: 57.66 mL/min — ABNORMAL LOW (ref >60.00)
Glucose, Bld: 223 mg/dL — ABNORMAL HIGH (ref 70–99)
Potassium: 4.5 mEq/L (ref 3.5–5.1)
Sodium: 139 mEq/L (ref 135–145)
Total Bilirubin: 0.4 mg/dL (ref 0.2–1.2)
Total Protein: 7.3 g/dL (ref 6.0–8.3)

## 2018-08-08 LAB — CBC
HCT: 42 % (ref 39.0–52.0)
Hemoglobin: 14.1 g/dL (ref 13.0–17.0)
MCHC: 33.5 g/dL (ref 30.0–36.0)
MCV: 91 fl (ref 78.0–100.0)
Platelets: 276 10*3/uL (ref 150.0–400.0)
RBC: 4.62 Mil/uL (ref 4.22–5.81)
RDW: 14.6 % (ref 11.5–15.5)
WBC: 7.6 10*3/uL (ref 4.0–10.5)

## 2018-08-08 LAB — TSH: TSH: 5.39 u[IU]/mL — ABNORMAL HIGH (ref 0.35–4.50)

## 2018-08-08 LAB — T4, FREE: Free T4: 0.75 ng/dL (ref 0.60–1.60)

## 2018-08-08 LAB — HM DIABETES FOOT EXAM

## 2018-08-08 LAB — HEMOGLOBIN A1C: Hgb A1c MFr Bld: 7.1 % — ABNORMAL HIGH (ref 4.6–6.5)

## 2018-08-08 NOTE — Addendum Note (Signed)
Addended by: Viviana Simpler I on: 08/08/2018 01:42 PM   Modules accepted: Orders

## 2018-08-08 NOTE — Assessment & Plan Note (Signed)
Seems euthryoid Will check labs 

## 2018-08-08 NOTE — Progress Notes (Addendum)
Subjective:    Patient ID: Randy Livings., male    DOB: September 16, 1940, 77 y.o.   MRN: [id number]  HPI Here with wife for Medicare wellness visit and follow up of chronic health conditions Reviewed form and advanced directives Reviewed other doctors No alcohol or tobacco Still not able to exercise--walks with walker all the time now. Too shaky for cane Stand by assist for showers. Independent with other ADLs Has chair lift to get to 2nd floor [place] Wife does all bills, shopping, instrumental ADLs No falls Chronic depression Vision is fine. Hearing okay Memory seems to be okay  Finally not bleeding Did have the surgery for hemorrhoids ---Jamal Collin  Not checking sugars No hypoglycemic episodes Ongoing neuropathy---the gabapentin helps some (1200mg  at bedtime)  Depression is controlled Only taking the ritalin once a day now No longer using xanax Still on the zyprexa  Energy levels are fine Thyroid seems fine  Past CKD 3 GFR borderline lately  Current Outpatient Medications on File Prior to Visit  Medication Sig Dispense Refill  . acetaminophen (TYLENOL) 650 MG CR tablet Take 650 mg by mouth every 8 (eight) hours as needed for pain.     Marland Kitchen atorvastatin (LIPITOR) 20 MG tablet TAKE 1 TABLET BY MOUTH ONCE DAILY 90 tablet 3  . escitalopram (LEXAPRO) 20 MG tablet Take 1 tablet (20 mg total) by mouth every morning. (Patient taking differently: Take 20 mg by mouth 2 (two) times daily. ) 1 tablet 0  . Ferrous Sulfate (IRON SLOW RELEASE PO) Take 1 tablet by mouth every other day.     . gabapentin (NEURONTIN) 300 MG capsule Take 3-4 capsules (900-1,200 mg total) by mouth at bedtime. 120 capsule 11  . glipiZIDE (GLUCOTROL) 5 MG tablet TAKE 1 TABLET BY MOUTH TWICE A DAY BEFORE A MEAL 180 tablet 3  . hydrocortisone-pramoxine (ANALPRAM HC) 2.5-1 % rectal cream Place 1 application rectally 2 (two) times daily. (Patient taking differently: Place 1 application rectally 2 (two) times  daily as needed. ) 30 g 0  . levothyroxine (SYNTHROID, LEVOTHROID) 75 MCG tablet TAKE 38mcg BY MOUTH ONCE A DAY 90 tablet 3  . lisinopril (PRINIVIL,ZESTRIL) 10 MG tablet TAKE 1 TABLET BY MOUTH ONCE A DAY 90 tablet 3  . metFORMIN (GLUCOPHAGE) 500 MG tablet TAKE 500mg  BY MOUTH 3 TIMES DAILY 270 tablet 3  . methylphenidate (RITALIN) 5 MG tablet TAKE 1 TABLET BY MOUTH TWICE A DAY 60 tablet 0  . OLANZapine (ZYPREXA) 10 MG tablet TAKE 1 TABLET BY MOUTH EVERY NIGHT AT BEDTIME 30 tablet 11  . sennosides-docusate sodium (SENOKOT-S) 8.6-50 MG tablet Take 2 tablets by mouth daily as needed.     . vitamin B-12 (CYANOCOBALAMIN) 1000 MCG tablet Take 1,000 mcg by mouth daily.       No current facility-administered medications on file prior to visit.     Allergies  Allergen Reactions  . Vytorin [Ezetimibe-Simvastatin] Nausea Only    patient nor wife does not remember    Past Medical History:  Diagnosis Date  . Anemia   . Anxiety   . Dementia ([place])   . Depression    recurrent and severe  . Diabetes mellitus    Type II with nephropathy  . GI bleed   . HLD (hyperlipidemia)   . Hypertension   . Hypothyroidism   . Internal hemorrhoid   . Osteoarthritis   . Personal history of colonic adenoma 12/03/2012    Past Surgical History:  Procedure Laterality Date  .  COLONOSCOPY    . COLONOSCOPY WITH PROPOFOL N/A 12/20/2016   Procedure: COLONOSCOPY WITH PROPOFOL;  Surgeon: Jonathon Bellows, MD;  Location: [facility] ENDOSCOPY;  Service: Endoscopy;  Laterality: N/A;  . ESOPHAGOGASTRODUODENOSCOPY (EGD) WITH PROPOFOL N/A 12/17/2016   Procedure: ESOPHAGOGASTRODUODENOSCOPY (EGD) WITH PROPOFOL;  Surgeon: Wilford Corner, MD;  Location: [facility] ENDOSCOPY;  Service: Endoscopy;  Laterality: N/A;  . FLEXIBLE SIGMOIDOSCOPY N/A 12/17/2016   Procedure: FLEXIBLE SIGMOIDOSCOPY;  Surgeon: Wilford Corner, MD;  Location: [facility] ENDOSCOPY;  Service: Endoscopy;  Laterality: N/A;  . HEMORRHOID BANDING  04/2017  . HEMORRHOID SURGERY N/A  06/14/2017   Procedure: HEMORRHOIDECTOMY;  Surgeon: Christene Lye, MD;  Location: [facility] ORS;  Service: General;  Laterality: N/A;  . HERNIA REPAIR    . JOINT REPLACEMENT Bilateral   . PROCTOSCOPY N/A 06/14/2017   Procedure: PROCTOSCOPY;  Surgeon: Christene Lye, MD;  Location: [facility] ORS;  Service: General;  Laterality: N/A;  . TOTAL KNEE ARTHROPLASTY  11/09   Right-Dr. Clair Gulling ([facility])  . TOTAL KNEE ARTHROPLASTY  6/10   Left-Dr. Clair Gulling ([facility])  . UMBILICAL HERNIA REPAIR  2000    Family History  Problem Relation Age of Onset  . Heart failure Father   . Diabetes Father   . Depression Father   . Coronary artery disease Father   . Breast cancer Sister 77  . Lung cancer Sister   . Depression Sister   . Stomach cancer Mother 24  . Stroke Unknown        Paternal side  . Parkinsonism Unknown        Cousin  . Colon cancer Neg Hx   . Rectal cancer Neg Hx   . Esophageal cancer Neg Hx   . Colon polyps Neg Hx     Social History   Socioeconomic History  . Marital status: Married    Spouse name: Randy Mcdaniel  . Number of children: 2  . Years of education: Not on file  . Highest education level: Not on file  Occupational History  . Occupation: Retired  Scientific laboratory technician  . Financial resource strain: Not on file  . Food insecurity:    Worry: Not on file    Inability: Not on file  . Transportation needs:    Medical: Not on file    Non-medical: Not on file  Tobacco Use  . Smoking status: Never Smoker  . Smokeless tobacco: Former Systems developer    Types: Chew  Substance and Sexual Activity  . Alcohol use: No    Alcohol/week: 0.0 standard drinks  . Drug use: No  . Sexual activity: Not on file  Lifestyle  . Physical activity:    Days per week: Not on file    Minutes per session: Not on file  . Stress: Not on file  Relationships  . Social connections:    Talks on phone: Not on file    Gets together: Not on file    Attends religious service: Not on file    Active member of club or  organization: Not on file    Attends meetings of clubs or organizations: Not on file    Relationship status: Not on file  . Intimate partner violence:    Fear of current or ex partner: Not on file    Emotionally abused: Not on file    Physically abused: Not on file    Forced sexual activity: Not on file  Other Topics Concern  . Not on file  Social History Narrative   Has living will.  Wife is formal health care POA.  Daughter Randy Mcdaniel is alternate   Would accept resuscitation attempts   No tube feeds if cognitively unaware   Review of Systems  Appetite is fine Weight is fairly stable Still not sleeping well Wears seat belt Bowels "iffy"----varies from constipation/diarrhea (discussed avoiding imodium) Voids okay---empties slowly.  No heartburn or dysphagia Bruises easily. No suspicious lesions  Keeps up with dentist     Objective:   Physical Exam  Constitutional: He is oriented to person, place, and time. He appears well-developed. No distress.  HENT:  Mouth/Throat: Oropharynx is clear and moist. No oropharyngeal exudate.  Neck: No thyromegaly present.  Cardiovascular: Normal rate, regular rhythm, normal heart sounds and intact distal pulses. Exam reveals no gallop.  No murmur heard. Respiratory: Effort normal and breath sounds normal. No respiratory distress. He has no wheezes. He has no rales.  GI: Soft. There is no tenderness.  Musculoskeletal: He exhibits no edema or tenderness.  Lymphadenopathy:    He has no cervical adenopathy.  Neurological: He is alert and oriented to person, place, and time.  President--- "Gerarda Fraction Trump, Obama, Bush" [phone] D-l-r-o-w Recall 0/3  Decreased sensation in feet  Skin: No rash noted.  No foot lesions  Psychiatric: He has a normal mood and affect.           Assessment & Plan:

## 2018-08-08 NOTE — Assessment & Plan Note (Signed)
I have personally reviewed the Medicare Annual Wellness questionnaire and have noted 1. The patient's medical and social history 2. Their use of alcohol, tobacco or illicit drugs 3. Their current medications and supplements 4. The patient's functional ability including ADL's, fall risks, home safety risks and hearing or visual             impairment. 5. Diet and physical activities 6. Evidence for depression or mood disorders  The patients weight, height, BMI and visual acuity have been recorded in the chart I have made referrals, counseling and provided education to the patient based review of the above and I have provided the pt with a written personalized care plan for preventive services.  I have provided you with a copy of your personalized plan for preventive services. Please take the time to review along with your updated medication list.  Flu vaccine today Td booster if any injury No cancer screening due to age Discussed leg strengthening

## 2018-08-08 NOTE — Assessment & Plan Note (Signed)
See social history 

## 2018-08-08 NOTE — Assessment & Plan Note (Signed)
GFR just under 60 again On ACEI No other action needed

## 2018-08-08 NOTE — Assessment & Plan Note (Signed)
Doing well on triple Rx No wean now

## 2018-08-08 NOTE — Assessment & Plan Note (Signed)
Hopefully still good control Gabapentin for neuropathy

## 2018-08-08 NOTE — Addendum Note (Signed)
Addended by: Pilar Grammes on: 08/08/2018 09:07 AM   Modules accepted: Orders

## 2018-09-25 ENCOUNTER — Other Ambulatory Visit: Payer: Self-pay | Admitting: Internal Medicine

## 2018-09-25 NOTE — Telephone Encounter (Signed)
Last filled 07-23-18 #60 Last OV 12-11-9 Next OV 08-13-19 [place]

## 2018-10-08 MED ORDER — GLUCOSE BLOOD VI STRP: ORAL_STRIP | 4 refills | Status: DC

## 2018-10-08 MED ORDER — ONETOUCH VERIO W/DEVICE KIT: 1.0000 | PACK | Freq: Once | 0 refills | Status: AC

## 2018-10-08 MED ORDER — ONETOUCH DELICA LANCETS 33G MISC: 3 refills | Status: DC

## 2018-10-08 NOTE — Telephone Encounter (Signed)
OneTouch products sent to pharmacy.

## 2018-10-17 NOTE — Telephone Encounter (Signed)
Please see if he will qualify for Freestyle Libre--based on inability to use the fingersticks appropriately

## 2018-11-12 ENCOUNTER — Other Ambulatory Visit: Payer: Self-pay | Admitting: Internal Medicine

## 2018-11-19 ENCOUNTER — Other Ambulatory Visit: Payer: Self-pay | Admitting: Internal Medicine

## 2018-11-19 NOTE — Telephone Encounter (Signed)
Last filled 09-26-18 #60 Last OV 08-08-18 Next OV 08-13-19 [place]

## 2019-01-22 ENCOUNTER — Other Ambulatory Visit: Payer: Self-pay | Admitting: Internal Medicine

## 2019-01-22 NOTE — Telephone Encounter (Signed)
Last filled 11-19-18 #60 Last OV 08-08-18 Next OV 08-13-19 [place]

## 2019-03-18 ENCOUNTER — Other Ambulatory Visit: Payer: Self-pay | Admitting: Internal Medicine

## 2019-03-18 NOTE — Telephone Encounter (Signed)
LAst filled 01-22-19 #60 Last OV 08-08-18 Next OV 08-13-19 [place]

## 2019-05-22 ENCOUNTER — Other Ambulatory Visit: Payer: Self-pay | Admitting: Internal Medicine

## 2019-05-22 NOTE — Telephone Encounter (Signed)
Last filled 03-18-19 #60 Last OV 08-08-18 Next OV 08-13-19 [place]

## 2019-07-08 ENCOUNTER — Other Ambulatory Visit: Payer: Self-pay | Admitting: Internal Medicine

## 2019-07-08 NOTE — Telephone Encounter (Signed)
Name of Medication: Ritalin 5 mg Name of Pharmacy: [facility] or Written Date and Quantity: 05/23/2019 #60 with 0 refill. Last Office Visit and Type: 08/08/2018 CPE Next Office Visit and Type: 08/13/2019 CPE Last Controlled Substance Agreement Date: none Last UDS: none

## 2019-08-13 ENCOUNTER — Encounter: Payer: Self-pay | Admitting: Internal Medicine

## 2019-08-13 ENCOUNTER — Ambulatory Visit (INDEPENDENT_AMBULATORY_CARE_PROVIDER_SITE_OTHER): Payer: Medicare Other | Admitting: Internal Medicine

## 2019-08-13 ENCOUNTER — Other Ambulatory Visit: Payer: Self-pay

## 2019-08-13 VITALS — BP 110/60 | HR 108 | Temp 97.6°F | Ht 69.0 in | Wt 199.0 lb

## 2019-08-13 DIAGNOSIS — N1831 Chronic kidney disease, stage 3a: Secondary | ICD-10-CM | POA: Diagnosis not present

## 2019-08-13 DIAGNOSIS — Z23 Encounter for immunization: Secondary | ICD-10-CM

## 2019-08-13 DIAGNOSIS — Z0000 Encounter for general adult medical examination without abnormal findings: Secondary | ICD-10-CM | POA: Diagnosis not present

## 2019-08-13 DIAGNOSIS — E1149 Type 2 diabetes mellitus with other diabetic neurological complication: Secondary | ICD-10-CM | POA: Diagnosis not present

## 2019-08-13 DIAGNOSIS — E039 Hypothyroidism, unspecified: Secondary | ICD-10-CM | POA: Diagnosis not present

## 2019-08-13 DIAGNOSIS — Z7189 Other specified counseling: Secondary | ICD-10-CM

## 2019-08-13 DIAGNOSIS — F3341 Major depressive disorder, recurrent, in partial remission: Secondary | ICD-10-CM

## 2019-08-13 LAB — HEPATIC FUNCTION PANEL
ALT: 17 U/L (ref 0–53)
AST: 23 U/L (ref 0–37)
Albumin: 3.9 g/dL (ref 3.5–5.2)
Alkaline Phosphatase: 74 U/L (ref 39–117)
Bilirubin, Direct: 0 mg/dL (ref 0.0–0.3)
Total Bilirubin: 0.4 mg/dL (ref 0.2–1.2)
Total Protein: 6.8 g/dL (ref 6.0–8.3)

## 2019-08-13 LAB — LIPID PANEL
Cholesterol: 140 mg/dL (ref 0–200)
HDL: 47.1 mg/dL (ref >39.00)
LDL Cholesterol: 69 mg/dL (ref 0–99)
NonHDL: 92.99
Total CHOL/HDL Ratio: 3
Triglycerides: 118 mg/dL (ref 0.0–149.0)
VLDL: 23.6 mg/dL (ref 0.0–40.0)

## 2019-08-13 LAB — CBC
HCT: 38.7 % — ABNORMAL LOW (ref 39.0–52.0)
Hemoglobin: 12.7 g/dL — ABNORMAL LOW (ref 13.0–17.0)
MCHC: 32.7 g/dL (ref 30.0–36.0)
MCV: 91.6 fl (ref 78.0–100.0)
Platelets: 280 10*3/uL (ref 150.0–400.0)
RBC: 4.23 Mil/uL (ref 4.22–5.81)
RDW: 14.4 % (ref 11.5–15.5)
WBC: 8.2 10*3/uL (ref 4.0–10.5)

## 2019-08-13 LAB — RENAL FUNCTION PANEL
Albumin: 3.9 g/dL (ref 3.5–5.2)
BUN: 24 mg/dL — ABNORMAL HIGH (ref 6–23)
CO2: 27 mEq/L (ref 19–32)
Calcium: 10 mg/dL (ref 8.4–10.5)
Chloride: 103 mEq/L (ref 96–112)
Creatinine, Ser: 1.27 mg/dL (ref 0.40–1.50)
GFR: 54.6 mL/min — ABNORMAL LOW (ref >60.00)
Glucose, Bld: 167 mg/dL — ABNORMAL HIGH (ref 70–99)
Phosphorus: 3.3 mg/dL (ref 2.3–4.6)
Potassium: 4.5 mEq/L (ref 3.5–5.1)
Sodium: 138 mEq/L (ref 135–145)

## 2019-08-13 LAB — TSH: TSH: 5.57 u[IU]/mL — ABNORMAL HIGH (ref 0.35–4.50)

## 2019-08-13 LAB — HEMOGLOBIN A1C: Hgb A1c MFr Bld: 7.9 % — ABNORMAL HIGH (ref 4.6–6.5)

## 2019-08-13 LAB — T4, FREE: Free T4: 0.95 ng/dL (ref 0.60–1.60)

## 2019-08-13 LAB — HM DIABETES FOOT EXAM

## 2019-08-13 NOTE — Progress Notes (Signed)
Hearing Screening   Method: Audiometry   125Hz  250Hz  500Hz  1000Hz  2000Hz  3000Hz  4000Hz  6000Hz  8000Hz   Right ear:   0 0 0  0    Left ear:   40 0 0  0    Vision Screening Comments: September 2020

## 2019-08-13 NOTE — Assessment & Plan Note (Signed)
Seems euthyroid Labs today

## 2019-08-13 NOTE — Assessment & Plan Note (Signed)
See social history 

## 2019-08-13 NOTE — Progress Notes (Signed)
Subjective:    Patient ID: Randy Mcdaniel., male    DOB: [birth year], 78 y.o.   MRN: [id number]  HPI Here with wife for Medicare wellness visit and follow up of chronic health conditions  This visit occurred during the SARS-CoV-2 public health emergency.  Safety protocols were in place, including screening questions prior to the visit, additional usage of staff PPE, and extensive cleaning of exam room while observing appropriate contact time as indicated for disinfecting solutions.   Reviewed form and advanced directives Reviewed other doctors No alcohol or tobacco Still unable to exercise He stays in all the time [facility] with walker. Needs help with showering and some with dressing Wife still does all instrumental ADLs No falls Vision is okay Hearing is not good--recommended audiology evaluation No memory problems  Depression mostly controlled Wife feels he has been "pretty good" They moved to safer place----no steps Continues on the same meds  Checks sugars daily Usually 106-170 Cut metformin to 2 a day---diarrhea is better Ongoing numbness and foot pain is stable. Continues on gabapentin  Known GFR in 50's BP generally okay on lisinopril  Urinary flow is slow and flow is poor Nocturia intermittently  No chest pain or SOB No palpitations No dizziness or syncope No edema No headaches  Current Outpatient Medications on File Prior to Visit  Medication Sig Dispense Refill  . acetaminophen (TYLENOL) 650 MG CR tablet Take 650 mg by mouth every 8 (eight) hours as needed for pain.     Marland Kitchen atorvastatin (LIPITOR) 20 MG tablet TAKE 1 TABLET BY MOUTH ONCE DAILY 90 tablet 3  . escitalopram (LEXAPRO) 20 MG tablet TAKE 2 TABLETS BY MOUTH EVERY MORNING 180 tablet 3  . gabapentin (NEURONTIN) 300 MG capsule TAKE 3 TO 4 CAPSULES (900-1,200 MG TOTAL) BY MOUTH AT BEDTIME 120 capsule 11  . glipiZIDE (GLUCOTROL) 5 MG tablet TAKE 1 TABLET BY MOUTH TWICE (2) DAILY BEFORE A MEAL 180 tablet  3  . glucose blood (ONETOUCH VERIO) test strip Use to check blood sugar once a day. Dx Code E11.49 100 each 4  . hydrocortisone-pramoxine (ANALPRAM HC) 2.5-1 % rectal cream Place 1 application rectally 2 (two) times daily. (Patient taking differently: Place 1 application rectally 2 (two) times daily as needed. ) 30 g 0  . levothyroxine (SYNTHROID, LEVOTHROID) 75 MCG tablet TAKE 1 TABLET BY MOUTH ONCE DAILY 90 tablet 3  . lisinopril (PRINIVIL,ZESTRIL) 10 MG tablet TAKE 1 TABLET BY MOUTH ONCE A DAY 90 tablet 3  . metFORMIN (GLUCOPHAGE) 500 MG tablet TAKE 1 TABLET BY MOUTH 3 TIMES DAILY (Patient taking differently: 1,000 mg. TAKE 1 TABLET BY MOUTH 3 TIMES DAILY) 270 tablet 0  . methylphenidate (RITALIN) 5 MG tablet TAKE 1 TABLET BY MOUTH TWICE (2) DAILY 60 tablet 0  . OLANZapine (ZYPREXA) 10 MG tablet TAKE 1 TABLET BY MOUTH EVERY NIGHT AT BEDTIME 30 tablet 11  . ONETOUCH DELICA LANCETS 99991111 MISC Use to obtain blood sugar sample once a day. Dx Code E11.49 100 each 3  . sennosides-docusate sodium (SENOKOT-S) 8.6-50 MG tablet Take 2 tablets by mouth daily as needed.     . vitamin B-12 (CYANOCOBALAMIN) 1000 MCG tablet Take 1,000 mcg by mouth daily.       No current facility-administered medications on file prior to visit.    Allergies  Allergen Reactions  . Vytorin [Ezetimibe-Simvastatin] Nausea Only    patient nor wife does not remember    Past Medical History:  Diagnosis Date  .  Anemia   . Anxiety   . Dementia ([place])   . Depression    recurrent and severe  . Diabetes mellitus    Type II with nephropathy  . GI bleed   . HLD (hyperlipidemia)   . Hypertension   . Hypothyroidism   . Internal hemorrhoid   . Osteoarthritis   . Personal history of colonic adenoma 12/03/2012    Past Surgical History:  Procedure Laterality Date  . COLONOSCOPY    . COLONOSCOPY WITH PROPOFOL N/A 12/20/2016   Procedure: COLONOSCOPY WITH PROPOFOL;  Surgeon: Jonathon Bellows, MD;  Location[facility] ENDOSCOPY;  Service:  Endoscopy;  Laterality: N/A;  . ESOPHAGOGASTRODUODENOSCOPY (EGD) WITH PROPOFOL N/A 12/17/2016   Procedure: ESOPHAGOGASTRODUODENOSCOPY (EGD) WITH PROPOFOL;  Surgeon: Wilford Corner, MD;  Location: [facility] ENDOSCOPY;  Service: Endoscopy;  Laterality: N/A;  . FLEXIBLE SIGMOIDOSCOPY N/A 12/17/2016   Procedure: FLEXIBLE SIGMOIDOSCOPY;  Surgeon: Wilford Corner, MD;  Location: [facility] ENDOSCOPY;  Service: Endoscopy;  Laterality: N/A;  . HEMORRHOID BANDING  04/2017  . HEMORRHOID SURGERY N/A 06/14/2017   Procedure: HEMORRHOIDECTOMY;  Surgeon: Christene Lye, MD;  Location: [facility] ORS;  Service: General;  Laterality: N/A;  . HERNIA REPAIR    . JOINT REPLACEMENT Bilateral   . PROCTOSCOPY N/A 06/14/2017   Procedure: PROCTOSCOPY;  Surgeon: Christene Lye, MD;  Location: [facility] ORS;  Service: General;  Laterality: N/A;  . TOTAL KNEE ARTHROPLASTY  11/09   Right-Dr. Clair Gulling ([facility])  . TOTAL KNEE ARTHROPLASTY  6/10   Left-Dr. Clair Gulling ([facility])  . UMBILICAL HERNIA REPAIR  2000    Family History  Problem Relation Age of Onset  . Heart failure Father   . Diabetes Father   . Depression Father   . Coronary artery disease Father   . Breast cancer Sister 60  . Lung cancer Sister   . Depression Sister   . Stomach cancer Mother 52  . Stroke Unknown        Paternal side  . Parkinsonism Unknown        Cousin  . Colon cancer Neg Hx   . Rectal cancer Neg Hx   . Esophageal cancer Neg Hx   . Colon polyps Neg Hx     Social History   Socioeconomic History  . Marital status: Married    Spouse name: Randy Mcdaniel  . Number of children: 2  . Years of education: Not on file  . Highest education level: Not on file  Occupational History  . Occupation: Retired  Tobacco Use  . Smoking status: Never Smoker  . Smokeless tobacco: Former Systems developer    Types: Chew  Substance and Sexual Activity  . Alcohol use: No    Alcohol/week: 0.0 standard drinks  . Drug use: No  . Sexual activity: Not on file  Other Topics  Concern  . Not on file  Social History Narrative   Has living will.   Wife is formal health care POA.  Daughter [name] is alternate   Would accept resuscitation attempts   No tube feeds if cognitively unaware   Social Determinants of Health   Financial Resource Strain:   . Difficulty of Paying Living Expenses: Not on file  Food Insecurity:   . Worried About Charity fundraiser in the Last Year: Not on file  . Ran Out of Food in the Last Year: Not on file  Transportation Needs:   . Lack of Transportation (Medical): Not on file  . Lack of Transportation (Non-Medical): Not on file  Physical Activity:   .  Days of Exercise per Week: Not on file  . Minutes of Exercise per Session: Not on file  Stress:   . Feeling of Stress : Not on file  Social Connections:   . Frequency of Communication with Friends and Family: Not on file  . Frequency of Social Gatherings with Friends and Family: Not on file  . Attends Religious Services: Not on file  . Active Member of Clubs or Organizations: Not on file  . Attends Archivist Meetings: Not on file  . Marital Status: Not on file  Intimate Partner Violence:   . Fear of Current or Ex-Partner: Not on file  . Emotionally Abused: Not on file  . Physically Abused: Not on file  . Sexually Abused: Not on file   Review of Systems Appetite is fair Weight stable Trouble initiating sleep due to neuropathy (despite 1200mg  of gabapentin) Wears seat belt Teeth okay--keeps up with dentist No rash or suspicious skin lesions No heartburn or dysphagia Still gets diarrhea at times. No more blood No sig back or joint pains    Objective:   Physical Exam  Constitutional: He is oriented to person, place, and time. He appears well-developed. No distress.  HENT:  Mouth/Throat: Oropharynx is clear and moist. No oropharyngeal exudate.  Full upper plate  Neck: No thyromegaly present.  Cardiovascular: Normal rate, regular rhythm, normal heart sounds and  intact distal pulses. Exam reveals no gallop.  No murmur heard. Faint pedal pulses  Respiratory: Effort normal. No respiratory distress. He has no wheezes. He has no rales.  Slight rhonchi right base  GI: Soft. There is no abdominal tenderness.  Musculoskeletal:        General: No tenderness or edema.  Lymphadenopathy:    He has no cervical adenopathy.  Neurological: He is alert and oriented to person, place, and time.  President--- "Daisy Floro, Obama, Bush" [phone] D-l-r-o-w Recall 2/3  Little sensation in feet  Skin: No rash noted. No erythema.  No foot lesions  Psychiatric: He has a normal mood and affect. His behavior is normal.           Assessment & Plan:

## 2019-08-13 NOTE — Assessment & Plan Note (Signed)
On ACEI Will check today

## 2019-08-13 NOTE — Assessment & Plan Note (Signed)
I have personally reviewed the Medicare Annual Wellness questionnaire and have noted 1. The patient's medical and social history 2. Their use of alcohol, tobacco or illicit drugs 3. Their current medications and supplements 4. The patient's functional ability including ADL's, fall risks, home safety risks and hearing or visual             impairment. 5. Diet and physical activities 6. Evidence for depression or mood disorders  The patients weight, height, BMI and visual acuity have been recorded in the chart I have made referrals, counseling and provided education to the patient based review of the above and I have provided the pt with a written personalized care plan for preventive services.  I have provided you with a copy of your personalized plan for preventive services. Please take the time to review along with your updated medication list.  Flu vaccine today Td if any injury Done with cancer screening Discussed quad strengthening Consider shingrix at pharmacy COVID vaccine as soon as available for him

## 2019-08-13 NOTE — Assessment & Plan Note (Signed)
Seems to have good control Will check labs 

## 2019-08-13 NOTE — Assessment & Plan Note (Signed)
Doing okay with current regimen No wean appropriate

## 2019-09-12 ENCOUNTER — Telehealth: Payer: Self-pay

## 2019-09-12 DIAGNOSIS — E1149 Type 2 diabetes mellitus with other diabetic neurological complication: Secondary | ICD-10-CM

## 2019-09-12 MED ORDER — ACCU-CHEK SOFT TOUCH LANCETS MISC: 3 refills | Status: DC

## 2019-09-12 MED ORDER — ACCU-CHEK AVIVA PLUS VI STRP: ORAL_STRIP | 3 refills | Status: DC

## 2019-09-12 MED ORDER — ACCU-CHEK AVIVA PLUS W/DEVICE KIT: PACK | 0 refills | Status: DC

## 2019-09-12 NOTE — Telephone Encounter (Signed)
[place] Night - Client Nonclinical Telephone Record AccessNurse Client [place] Night - Client Client Site [place] Physician Viviana Simpler - MD Contact Type Call Who Is Calling Patient / Member / Family / Caregiver Caller Name Phineas Semen Phone Number [phone] Patient Name Randy Mcdaniel Patient DOB 1940/09/02 Call Type Message Only Information Provided Reason for Call Request for General Office Information Initial Comment Caller states she is calling with [facility] needing a new Rx sent in for a patient. The patient is requesting Accu-check Aviva Plus meter and test strips along with the Accu-check soft click lancets. Additional Comment Caller wanted to leave message for the office. Office hours were provided. Disp. Time Disposition Final User 09/11/2019 7:18:31 PM General Information Provided Yes Christel Mormon Call Closed By: Christel Mormon Transaction Date/Time: 09/11/2019 7:10:28 PM (ET)

## 2019-09-12 NOTE — Telephone Encounter (Signed)
Rx for Accu-check Aviva Plus Meter, test strips and lancets sent to [place] as requested.

## 2019-09-17 ENCOUNTER — Other Ambulatory Visit: Payer: Self-pay | Admitting: Internal Medicine

## 2019-09-17 NOTE — Telephone Encounter (Signed)
Name of Medication: Methylphenidate Name of Pharmacy: [facility] or Written Date and Quantity: 07/08/19, #60 Last Office Visit and Type: 08/13/19, AWV Next Office Visit and Type: 02/10/20, 6 mo f/u Last Controlled Substance Agreement Date: 02/07/13 Last UDS: 02/07/13

## 2019-10-21 ENCOUNTER — Other Ambulatory Visit: Payer: Self-pay | Admitting: Internal Medicine

## 2019-10-29 ENCOUNTER — Ambulatory Visit
Admission: EM | Admit: 2019-10-29 | Discharge: 2019-10-29 | Disposition: A | Discharge status: Home / Self Care | Payer: Medicare PPO | Attending: Emergency Medicine | Admitting: Emergency Medicine

## 2019-10-29 ENCOUNTER — Other Ambulatory Visit: Payer: Self-pay

## 2019-10-29 ENCOUNTER — Encounter: Payer: Self-pay | Admitting: Emergency Medicine

## 2019-10-29 DIAGNOSIS — R739 Hyperglycemia, unspecified: Secondary | ICD-10-CM | POA: Diagnosis not present

## 2019-10-29 DIAGNOSIS — R350 Frequency of micturition: Secondary | ICD-10-CM | POA: Diagnosis not present

## 2019-10-29 LAB — POCT FASTING CBG KUC MANUAL ENTRY: POCT Glucose (KUC): 210 mg/dL — AB (ref 70–99)

## 2019-10-29 LAB — POCT URINALYSIS DIP (MANUAL ENTRY)
Glucose, UA: 100 mg/dL — AB
Leukocytes, UA: NEGATIVE
Nitrite, UA: NEGATIVE
Protein Ur, POC: 30 mg/dL — AB
Spec Grav, UA: 1.025 (ref 1.010–1.025)
Urobilinogen, UA: 1 E.U./dL
pH, UA: 5.5 (ref 5.0–8.0)

## 2019-10-29 NOTE — ED Provider Notes (Signed)
Randy Mcdaniel    CSN: [id number] Arrival date & time: 10/29/19  1342      History   Chief Complaint Chief Complaint  Patient presents with  . Urinary Frequency  . Altered Mental Status    HPI Randy Mcdaniel. is a 79 y.o. male.  Accompanied by his wife, patient presents with urinary frequency, confusion, decreased appetite x2 days.  The symptoms are reported by his wife.  Patient denies symptoms, including dysuria, but has dementia.  Patient and his wife deny fever, chills, abdominal pain, back pain, or other symptoms.  No treatments attempted at home.  The history is provided by the patient and the spouse.    Past Medical History:  Diagnosis Date  . Anemia   . Anxiety   . Dementia ([place])   . Depression    recurrent and severe  . Diabetes mellitus    Type II with nephropathy  . GI bleed   . HLD (hyperlipidemia)   . Hypertension   . Hypothyroidism   . Internal hemorrhoid   . Osteoarthritis   . Personal history of colonic adenoma 12/03/2012    Patient Active Problem List   Diagnosis Date Noted  . Chronic renal disease, stage III 08/08/2018  . BPH with obstruction/lower urinary tract symptoms 07/28/2017  . Iron deficiency anemia due to chronic blood loss 05/15/2017  . Advance directive discussed with patient 05/22/2015  . Diarrhea 01/06/2015  . Spinal stenosis, lumbar region, with neurogenic claudication 09/17/2014  . Routine general medical examination at a health care facility 10/16/2012  . Constipation 01/10/2011  . B12 DEFICIENCY 08/18/2010  . Type 2 diabetes mellitus with neurological manifestations, controlled ([place]) 02/17/2007  . OSTEOARTHRITIS 02/17/2007  . Hypothyroidism 02/15/2007  . Hyperlipemia 02/15/2007  . Depression, major, recurrent, in partial remission ([place]) 02/15/2007    Past Surgical History:  Procedure Laterality Date  . COLONOSCOPY    . COLONOSCOPY WITH PROPOFOL N/A 12/20/2016   Procedure: COLONOSCOPY WITH PROPOFOL;  Surgeon:  Jonathon Bellows, MD;  Location: [facility] ENDOSCOPY;  Service: Endoscopy;  Laterality: N/A;  . ESOPHAGOGASTRODUODENOSCOPY (EGD) WITH PROPOFOL N/A 12/17/2016   Procedure: ESOPHAGOGASTRODUODENOSCOPY (EGD) WITH PROPOFOL;  Surgeon: Wilford Corner, MD;  Location: [facility] ENDOSCOPY;  Service: Endoscopy;  Laterality: N/A;  . FLEXIBLE SIGMOIDOSCOPY N/A 12/17/2016   Procedure: FLEXIBLE SIGMOIDOSCOPY;  Surgeon: Wilford Corner, MD;  Location: [facility] ENDOSCOPY;  Service: Endoscopy;  Laterality: N/A;  . HEMORRHOID BANDING  04/2017  . HEMORRHOID SURGERY N/A 06/14/2017   Procedure: HEMORRHOIDECTOMY;  Surgeon: Christene Lye, MD;  Location: [facility] ORS;  Service: General;  Laterality: N/A;  . HERNIA REPAIR    . JOINT REPLACEMENT Bilateral   . PROCTOSCOPY N/A 06/14/2017   Procedure: PROCTOSCOPY;  Surgeon: Christene Lye, MD;  Location: [facility] ORS;  Service: General;  Laterality: N/A;  . TOTAL KNEE ARTHROPLASTY  11/09   Right-Dr. Clair Gulling ([facility])  . TOTAL KNEE ARTHROPLASTY  6/10   Left-Dr. Clair Gulling ([facility])  . UMBILICAL HERNIA REPAIR  2000       Home Medications    Prior to Admission medications   Medication Sig Start Date End Date Taking? Authorizing Provider  acetaminophen (TYLENOL) 650 MG CR tablet Take 650 mg by mouth every 8 (eight) hours as needed for pain.    Yes [provider]  atorvastatin (LIPITOR) 20 MG tablet TAKE 1 TABLET BY MOUTH ONCE DAILY 11/12/18  Yes Viviana Simpler I, MD  escitalopram (LEXAPRO) 20 MG tablet TAKE 2 TABLETS BY MOUTH EVERY MORNING 03/18/19  Yes  Venia Carbon, MD  gabapentin (NEURONTIN) 300 MG capsule TAKE 3 TO 4 CAPSULES (900-1,200 MG TOTAL) BY MOUTH AT BEDTIME 03/18/19  Yes Viviana Simpler I, MD  glipiZIDE (GLUCOTROL) 5 MG tablet TAKE 1 TABLET BY MOUTH TWICE (2) DAILY BEFORE A MEAL 10/21/19  Yes Viviana Simpler I, MD  glucose blood (ACCU-CHEK AVIVA PLUS) test strip Use to check blood sugars daily 09/12/19  Yes Venia Carbon, MD  hydrocortisone-pramoxine (ANALPRAM  HC) 2.5-1 % rectal cream Place 1 application rectally 2 (two) times daily. Patient taking differently: Place 1 application rectally 2 (two) times daily as needed.  05/18/17  Yes Sankar, Andreas Newport, MD  Lancets (ACCU-CHEK SOFT TOUCH) lancets Use to check blood sugars daily 09/12/19  Yes Viviana Simpler I, MD  levothyroxine (SYNTHROID, LEVOTHROID) 75 MCG tablet TAKE 1 TABLET BY MOUTH ONCE DAILY 11/12/18  Yes Viviana Simpler I, MD  lisinopril (PRINIVIL,ZESTRIL) 10 MG tablet TAKE 1 TABLET BY MOUTH ONCE A DAY 07/23/18  Yes Viviana Simpler I, MD  metFORMIN (GLUCOPHAGE) 500 MG tablet TAKE 1 TABLET BY MOUTH 3 TIMES DAILY Patient taking differently: 1,000 mg. TAKE 1 TABLET BY MOUTH 3 TIMES DAILY 07/08/19  Yes Viviana Simpler I, MD  methylphenidate (RITALIN) 5 MG tablet TAKE 1 TABLET BY MOUTH TWICE (2) DAILY 09/18/19  Yes Viviana Simpler I, MD  OLANZapine (ZYPREXA) 10 MG tablet TAKE 1 TABLET BY MOUTH EVERY NIGHT AT BEDTIME 03/18/19  Yes Venia Carbon, MD  sennosides-docusate sodium (SENOKOT-S) 8.6-50 MG tablet Take 2 tablets by mouth daily as needed.    Yes [provider]  vitamin B-12 (CYANOCOBALAMIN) 1000 MCG tablet Take 1,000 mcg by mouth daily.     Yes [provider]  Blood Glucose Monitoring Suppl (ACCU-CHEK AVIVA PLUS) w/Device KIT Use to check blood sugar daily 09/12/19   Venia Carbon, MD    Family History Family History  Problem Relation Age of Onset  . Heart failure Father   . Diabetes Father   . Depression Father   . Coronary artery disease Father   . Breast cancer Sister 18  . Lung cancer Sister   . Depression Sister   . Stomach cancer Mother 73  . Stroke Other        Paternal side  . Parkinsonism Other        Cousin  . Colon cancer Neg Hx   . Rectal cancer Neg Hx   . Esophageal cancer Neg Hx   . Colon polyps Neg Hx     Social History Social History   Tobacco Use  . Smoking status: Never Smoker  . Smokeless tobacco: Former Systems developer    Types: Chew    Substance Use Topics  . Alcohol use: No    Alcohol/week: 0.0 standard drinks  . Drug use: No     Allergies   Vytorin [ezetimibe-simvastatin]   Review of Systems Review of Systems  Constitutional: Positive for appetite change. Negative for chills and fever.  HENT: Negative for ear pain and sore throat.   Eyes: Negative for pain and visual disturbance.  Respiratory: Negative for cough and shortness of breath.   Cardiovascular: Negative for chest pain and palpitations.  Gastrointestinal: Negative for abdominal pain and vomiting.  Genitourinary: Positive for frequency. Negative for dysuria, flank pain and hematuria.  Musculoskeletal: Negative for arthralgias and back pain.  Skin: Negative for color change and rash.  Neurological: Negative for seizures and syncope.  Psychiatric/Behavioral: Positive for confusion.  All other systems reviewed and are negative.  Physical Exam Triage Vital Signs ED Triage Vitals  Enc Vitals Group     BP      Pulse      Resp      Temp      Temp src      SpO2      Weight      Height      Head Circumference      Peak Flow      Pain Score      Pain Loc      Pain Edu?      Excl. in [place]?    No data found.  Updated Vital Signs BP 139/81 (BP Location: Left Arm)   Pulse (!) 103   Temp 98.3 F (36.8 C) (Oral)   Resp 20   Ht '5\' 8"'  (1.727 m)   Wt 200 lb (90.7 kg)   SpO2 94%   BMI 30.41 kg/m   Visual Acuity Right Eye Distance:   Left Eye Distance:   Bilateral Distance:    Right Eye Near:   Left Eye Near:    Bilateral Near:     Physical Exam Vitals and nursing note reviewed.  Constitutional:      General: He is not in acute distress.    Appearance: He is well-developed.  HENT:     Head: Normocephalic and atraumatic.     Mouth/Throat:     Mouth: Mucous membranes are moist.     Pharynx: Oropharynx is clear.  Eyes:     Conjunctiva/sclera: Conjunctivae normal.  Cardiovascular:     Rate and Rhythm: Normal rate and regular  rhythm.     Heart sounds: No murmur.  Pulmonary:     Effort: Pulmonary effort is normal. No respiratory distress.     Breath sounds: Normal breath sounds.  Abdominal:     General: Bowel sounds are normal.     Palpations: Abdomen is soft.     Tenderness: There is no abdominal tenderness. There is no right CVA tenderness, left CVA tenderness, guarding or rebound.  Musculoskeletal:     Cervical back: Neck supple.  Skin:    General: Skin is warm and dry.  Neurological:     Mental Status: He is alert.     Comments: Ambulatory with walker.      UC Treatments / Results  Labs (all labs ordered are listed, but only abnormal results are displayed) Labs Reviewed  POCT URINALYSIS DIP (MANUAL ENTRY) - Abnormal; Notable for the following components:      Result Value   Glucose, UA =100 (*)    Bilirubin, UA small (*)    Ketones, POC UA trace (5) (*)    Blood, UA trace-lysed (*)    Protein Ur, POC =30 (*)    All other components within normal limits  POCT FASTING CBG [facility] MANUAL ENTRY - Abnormal; Notable for the following components:   POCT Glucose ([facility]) 210 (*)    All other components within normal limits  URINE CULTURE    EKG   Radiology No results found.  Procedures Procedures (including critical care time)  Medications Ordered in UC Medications - No data to display  Initial Impression / Assessment and Plan / UC Course  I have reviewed the triage vital signs and the nursing notes.  Pertinent labs & imaging results that were available during my care of the patient were reviewed by me and considered in my medical decision making (see chart for details).   Urinary frequency, Hyperglycemia.  Urine culture pending.  CBG 210.  Instructed patient and his wife to schedule a follow-up appointment as soon as possible with his PCP to discuss his elevated blood sugar and urinary frequency.  Discussed with them that he should go to the emergency department if he has acute worsening  symptoms.  Patient and his wife agree to this plan of care.     Final Clinical Impressions(s) / UC Diagnoses   Final diagnoses:  Urinary frequency  Hyperglycemia     Discharge Instructions     A urine culture is pending.      Call your primary care provider's office to schedule an appointment for your husband tomorrow to discuss his high blood sugar.    Go to the emergency department if he has acute worsening symptoms.        ED Prescriptions    None     PDMP not reviewed this encounter.   Sharion Balloon, NP 10/29/19 [phone]

## 2019-10-29 NOTE — ED Triage Notes (Signed)
Pt wife states that pt has been acting confused, no appetite, urinary frequency. Started about 2 days ago. His PCP sent him here to have a UA to check for UTI. Pt denies dysuria.

## 2019-10-29 NOTE — Discharge Instructions (Addendum)
A urine culture is pending.      Call your primary care provider's office to schedule an appointment for your husband tomorrow to discuss his high blood sugar.    Go to the emergency department if he has acute worsening symptoms.

## 2019-10-30 NOTE — Telephone Encounter (Signed)
I do think he needs to be checked---either in the office or urgent care. If he can't get in the office, advise urgent care. I will send her that message

## 2019-10-31 LAB — URINE CULTURE: Culture: <10000 — AB

## 2019-11-05 ENCOUNTER — Ambulatory Visit: Payer: Medicare PPO | Admitting: Internal Medicine

## 2019-11-05 ENCOUNTER — Other Ambulatory Visit: Payer: Self-pay

## 2019-11-05 ENCOUNTER — Encounter: Payer: Self-pay | Admitting: Internal Medicine

## 2019-11-05 DIAGNOSIS — R4182 Altered mental status, unspecified: Secondary | ICD-10-CM | POA: Diagnosis not present

## 2019-11-05 DIAGNOSIS — E1149 Type 2 diabetes mellitus with other diabetic neurological complication: Secondary | ICD-10-CM

## 2019-11-05 NOTE — Assessment & Plan Note (Signed)
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

## 2019-11-05 NOTE — Patient Instructions (Signed)
You can try an over the counter lidocaine patch on the most painful areas----or capsaicin cream (this would take a while to work)

## 2019-11-05 NOTE — Progress Notes (Signed)
Subjective:    Patient ID: Randy Mcdaniel., male    DOB: May 21, 1941, 79 y.o.   MRN: [id number]  HPI Here with wife due to behavior changes This visit occurred during the SARS-CoV-2 public health emergency.  Safety protocols were in place, including screening questions prior to the visit, additional usage of staff PPE, and extensive cleaning of exam room while observing appropriate contact time as indicated for disinfecting solutions.   "I went crazy" "He was so weird"---per wife Had increased urinary frequency---then one day stayed on commode for 3 hours Rescue came---he wouldn't go for evaluation. Was saying "I can't get clean"--but paper was clean Then wife took him to urgent care---urine was negative  He is still "tingling all over"  Wife had started "Relief Factor" 3 weeks before all this started in hopes of it helping his neuropathy Wife stopped it and now the symptoms have relieved  Current Outpatient Medications on File Prior to Visit  Medication Sig Dispense Refill  . acetaminophen (TYLENOL) 650 MG CR tablet Take 650 mg by mouth every 8 (eight) hours as needed for pain.     Marland Kitchen atorvastatin (LIPITOR) 20 MG tablet TAKE 1 TABLET BY MOUTH ONCE DAILY 90 tablet 3  . Blood Glucose Monitoring Suppl (ACCU-CHEK AVIVA PLUS) w/Device KIT Use to check blood sugar daily 1 kit 0  . escitalopram (LEXAPRO) 20 MG tablet TAKE 2 TABLETS BY MOUTH EVERY MORNING 180 tablet 3  . gabapentin (NEURONTIN) 300 MG capsule TAKE 3 TO 4 CAPSULES (900-1,200 MG TOTAL) BY MOUTH AT BEDTIME 120 capsule 11  . glipiZIDE (GLUCOTROL) 5 MG tablet TAKE 1 TABLET BY MOUTH TWICE (2) DAILY BEFORE A MEAL 180 tablet 3  . glucose blood (ACCU-CHEK AVIVA PLUS) test strip Use to check blood sugars daily 100 each 3  . hydrocortisone-pramoxine (ANALPRAM HC) 2.5-1 % rectal cream Place 1 application rectally 2 (two) times daily. (Patient taking differently: Place 1 application rectally 2 (two) times daily as needed. ) 30 g 0  .  Lancets (ACCU-CHEK SOFT TOUCH) lancets Use to check blood sugars daily 100 each 3  . levothyroxine (SYNTHROID, LEVOTHROID) 75 MCG tablet TAKE 1 TABLET BY MOUTH ONCE DAILY 90 tablet 3  . lisinopril (PRINIVIL,ZESTRIL) 10 MG tablet TAKE 1 TABLET BY MOUTH ONCE A DAY 90 tablet 3  . metFORMIN (GLUCOPHAGE) 500 MG tablet TAKE 1 TABLET BY MOUTH 3 TIMES DAILY (Patient taking differently: 1,000 mg. TAKE 1 TABLET BY MOUTH 3 TIMES DAILY) 270 tablet 0  . methylphenidate (RITALIN) 5 MG tablet TAKE 1 TABLET BY MOUTH TWICE (2) DAILY 60 tablet 0  . OLANZapine (ZYPREXA) 10 MG tablet TAKE 1 TABLET BY MOUTH EVERY NIGHT AT BEDTIME 30 tablet 11  . sennosides-docusate sodium (SENOKOT-S) 8.6-50 MG tablet Take 2 tablets by mouth daily as needed.     . vitamin B-12 (CYANOCOBALAMIN) 1000 MCG tablet Take 1,000 mcg by mouth daily.       No current facility-administered medications on file prior to visit.    Allergies  Allergen Reactions  . Vytorin [Ezetimibe-Simvastatin] Nausea Only    patient nor wife does not remember    Past Medical History:  Diagnosis Date  . Anemia   . Anxiety   . Dementia ([place])   . Depression    recurrent and severe  . Diabetes mellitus    Type II with nephropathy  . GI bleed   . HLD (hyperlipidemia)   . Hypertension   . Hypothyroidism   . Internal hemorrhoid   .  Osteoarthritis   . Personal history of colonic adenoma 12/03/2012    Past Surgical History:  Procedure Laterality Date  . COLONOSCOPY    . COLONOSCOPY WITH PROPOFOL N/A 12/20/2016   Procedure: COLONOSCOPY WITH PROPOFOL;  Surgeon: Jonathon Bellows, MD;  Location: [facility] ENDOSCOPY;  Service: Endoscopy;  Laterality: N/A;  . ESOPHAGOGASTRODUODENOSCOPY (EGD) WITH PROPOFOL N/A 12/17/2016   Procedure: ESOPHAGOGASTRODUODENOSCOPY (EGD) WITH PROPOFOL;  Surgeon: Wilford Corner, MD;  Location: [facility] ENDOSCOPY;  Service: Endoscopy;  Laterality: N/A;  . FLEXIBLE SIGMOIDOSCOPY N/A 12/17/2016   Procedure: FLEXIBLE SIGMOIDOSCOPY;  Surgeon: Wilford Corner, MD;  Location: [facility] ENDOSCOPY;  Service: Endoscopy;  Laterality: N/A;  . HEMORRHOID BANDING  04/2017  . HEMORRHOID SURGERY N/A 06/14/2017   Procedure: HEMORRHOIDECTOMY;  Surgeon: Christene Lye, MD;  Location: [facility] ORS;  Service: General;  Laterality: N/A;  . HERNIA REPAIR    . JOINT REPLACEMENT Bilateral   . PROCTOSCOPY N/A 06/14/2017   Procedure: PROCTOSCOPY;  Surgeon: Christene Lye, MD;  Location: [facility] ORS;  Service: General;  Laterality: N/A;  . TOTAL KNEE ARTHROPLASTY  11/09   Right-Dr. Clair Gulling ([facility])  . TOTAL KNEE ARTHROPLASTY  6/10   Left-Dr. Clair Gulling ([facility])  . UMBILICAL HERNIA REPAIR  2000    Family History  Problem Relation Age of Onset  . Heart failure Father   . Diabetes Father   . Depression Father   . Coronary artery disease Father   . Breast cancer Sister 39  . Lung cancer Sister   . Depression Sister   . Stomach cancer Mother 29  . Stroke Other        Paternal side  . Parkinsonism Other        Cousin  . Colon cancer Neg Hx   . Rectal cancer Neg Hx   . Esophageal cancer Neg Hx   . Colon polyps Neg Hx     Social History   Socioeconomic History  . Marital status: Married    Spouse name: [name]  . Number of children: 2  . Years of education: Not on file  . Highest education level: Not on file  Occupational History  . Occupation: Retired  Tobacco Use  . Smoking status: Never Smoker  . Smokeless tobacco: Former Systems developer    Types: Chew  Substance and Sexual Activity  . Alcohol use: No    Alcohol/week: 0.0 standard drinks  . Drug use: No  . Sexual activity: Not on file  Other Topics Concern  . Not on file  Social History Narrative   Has living will.   Wife is formal health care POA.  Daughter [name] is alternate   Would accept resuscitation attempts   No tube feeds if cognitively unaware   Social Determinants of Health   Financial Resource Strain:   . Difficulty of Paying Living Expenses: Not on file  Food Insecurity:   .  Worried About Charity fundraiser in the Last Year: Not on file  . Ran Out of Food in the Last Year: Not on file  Transportation Needs:   . Lack of Transportation (Medical): Not on file  . Lack of Transportation (Non-Medical): Not on file  Physical Activity:   . Days of Exercise per Week: Not on file  . Minutes of Exercise per Session: Not on file  Stress:   . Feeling of Stress : Not on file  Social Connections:   . Frequency of Communication with Friends and Family: Not on file  . Frequency of Social Gatherings with Friends  and Family: Not on file  . Attends Religious Services: Not on file  . Active Member of Clubs or Organizations: Not on file  . Attends Archivist Meetings: Not on file  . Marital Status: Not on file  Intimate Partner Violence:   . Fear of Current or Ex-Partner: Not on file  . Emotionally Abused: Not on file  . Physically Abused: Not on file  . Sexually Abused: Not on file   Review of Systems Sugars okay in general Appetite was off--but now close to back to normal Sleeping okay---goes to bed late and sleeps in chair in bed frequently    Objective:   Physical Exam  Constitutional: No distress.  Neck: No thyromegaly present.  Cardiovascular: Normal rate, regular rhythm and normal heart sounds. Exam reveals no gallop.  No murmur heard. Respiratory: Effort normal and breath sounds normal. No respiratory distress. He has no wheezes. He has no rales.  Musculoskeletal:        General: No edema.  Lymphadenopathy:    He has no cervical adenopathy.  Neurological:  Normal interaction now  Psychiatric: He has a normal mood and affect. His behavior is normal.           Assessment & Plan:

## 2019-11-05 NOTE — Assessment & Plan Note (Signed)
Sugars generally okay Discussed trying some topical Rx for the neuropathy

## 2019-11-15 ENCOUNTER — Telehealth: Payer: Self-pay

## 2019-11-15 DIAGNOSIS — E1149 Type 2 diabetes mellitus with other diabetic neurological complication: Secondary | ICD-10-CM

## 2019-11-15 MED ORDER — ACCU-CHEK AVIVA PLUS VI STRP: ORAL_STRIP | 3 refills | Status: DC

## 2019-11-15 NOTE — Telephone Encounter (Signed)
Pt wants test strips sent to [facility]

## 2019-11-19 ENCOUNTER — Other Ambulatory Visit: Payer: Self-pay | Admitting: Internal Medicine

## 2019-11-19 NOTE — Telephone Encounter (Signed)
Last filled 09-18-19 #60 Last OV 11-05-19 Next OV 02-10-20 [place]

## 2019-11-21 MED ORDER — ACCU-CHEK SOFTCLIX LANCET DEV KIT: 1.0000 | PACK | Freq: Once | 0 refills | Status: AC

## 2019-11-21 MED ORDER — ACCU-CHEK SOFTCLIX LANCETS MISC: 12 refills | Status: DC

## 2019-11-21 NOTE — Addendum Note (Signed)
Addended by: Pilar Grammes on: 11/21/2019 09:30 AM   Modules accepted: Orders

## 2020-01-21 ENCOUNTER — Other Ambulatory Visit: Payer: Self-pay | Admitting: Internal Medicine

## 2020-01-21 NOTE — Telephone Encounter (Signed)
Last filled 11-20-19 #60 Last OV 11-20-19 Next OV 02-10-20 [place]

## 2020-02-10 ENCOUNTER — Other Ambulatory Visit: Payer: Self-pay

## 2020-02-10 ENCOUNTER — Encounter: Payer: Self-pay | Admitting: Internal Medicine

## 2020-02-10 ENCOUNTER — Ambulatory Visit: Payer: Medicare PPO | Admitting: Internal Medicine

## 2020-02-10 VITALS — BP 118/70 | HR 97 | Temp 98.1°F | Ht 69.0 in | Wt 193.0 lb

## 2020-02-10 DIAGNOSIS — N1831 Chronic kidney disease, stage 3a: Secondary | ICD-10-CM | POA: Diagnosis not present

## 2020-02-10 DIAGNOSIS — F3341 Major depressive disorder, recurrent, in partial remission: Secondary | ICD-10-CM | POA: Diagnosis not present

## 2020-02-10 DIAGNOSIS — E1149 Type 2 diabetes mellitus with other diabetic neurological complication: Secondary | ICD-10-CM | POA: Diagnosis not present

## 2020-02-10 DIAGNOSIS — N401 Enlarged prostate with lower urinary tract symptoms: Secondary | ICD-10-CM

## 2020-02-10 LAB — POCT GLYCOSYLATED HEMOGLOBIN (HGB A1C): Hemoglobin A1C: 7.3 % — AB (ref 4.0–5.6)

## 2020-02-10 NOTE — Assessment & Plan Note (Signed)
Lab Results  Component Value Date   HGBA1C 7.3 (A) 02/10/2020   Control is good No new medications needed Neuropathy persists---she will add the OTC med (consider higher doses of just alpha lipoic acid) Would consider lyrica if ongoing issues

## 2020-02-10 NOTE — Assessment & Plan Note (Signed)
Stable on low dose ACEI Check again at next visit

## 2020-02-10 NOTE — Progress Notes (Signed)
Subjective:    Patient ID: Randy Livings., male    DOB: 16-Nov-1940, 79 y.o.   MRN: [id number]  HPI Here for follow up of diabetes and other chronic health conditions Wife is here as usual This visit occurred during the SARS-CoV-2 public health emergency.  Safety protocols were in place, including screening questions prior to the visit, additional usage of staff PPE, and extensive cleaning of exam room while observing appropriate contact time as indicated for disinfecting solutions.   Checks sugars daily 119-140's No low sugar reactions  Ongoing foot pain at night--despite gabapentin 1218m Mental state did return to normal after stopping the other supplement Now wants to try another supplement ("healthy feet and nerves")  Mood is okay No flare of the depression  No chest pain No SOB No dizziness or syncope No edema  Current Outpatient Medications on File Prior to Visit  Medication Sig Dispense Refill  . Accu-Chek Softclix Lancets lancets Use to obtain blood sample. E11.49 100 each 12  . acetaminophen (TYLENOL) 650 MG CR tablet Take 650 mg by mouth every 8 (eight) hours as needed for pain.     .Marland Kitchenatorvastatin (LIPITOR) 20 MG tablet TAKE 1 TABLET BY MOUTH ONCE DAILY 90 tablet 3  . Blood Glucose Monitoring Suppl (ACCU-CHEK AVIVA PLUS) w/Device KIT Use to check blood sugar daily 1 kit 0  . escitalopram (LEXAPRO) 20 MG tablet TAKE 2 TABLETS BY MOUTH EVERY MORNING 180 tablet 3  . gabapentin (NEURONTIN) 300 MG capsule TAKE 3 TO 4 CAPSULES (900-1,200 MG TOTAL) BY MOUTH AT BEDTIME 120 capsule 11  . glipiZIDE (GLUCOTROL) 5 MG tablet TAKE 1 TABLET BY MOUTH TWICE (2) DAILY BEFORE A MEAL 180 tablet 3  . glucose blood (ACCU-CHEK AVIVA PLUS) test strip Use to check blood sugars daily. Dx E11.49 100 each 3  . hydrocortisone-pramoxine (ANALPRAM HC) 2.5-1 % rectal cream Place 1 application rectally 2 (two) times daily. (Patient taking differently: Place 1 application rectally 2 (two) times  daily as needed. ) 30 g 0  . Lancets (ACCU-CHEK SOFT TOUCH) lancets Use to check blood sugars daily 100 each 3  . levothyroxine (SYNTHROID) 75 MCG tablet TAKE 1 TABLET BY MOUTH ONCE DAILY 90 tablet 3  . lisinopril (PRINIVIL,ZESTRIL) 10 MG tablet TAKE 1 TABLET BY MOUTH ONCE A DAY 90 tablet 3  . metFORMIN (GLUCOPHAGE) 500 MG tablet TAKE 1 TABLET BY MOUTH 3 TIMES DAILY 270 tablet 3  . methylphenidate (RITALIN) 5 MG tablet TAKE 1 TABLET BY MOUTH TWICE (2) DAILY (Patient taking differently: Take 5 mg by mouth daily. ) 60 tablet 0  . OLANZapine (ZYPREXA) 10 MG tablet TAKE 1 TABLET BY MOUTH EVERY NIGHT AT BEDTIME 30 tablet 11  . sennosides-docusate sodium (SENOKOT-S) 8.6-50 MG tablet Take 2 tablets by mouth daily as needed.     . vitamin B-12 (CYANOCOBALAMIN) 1000 MCG tablet Take 1,000 mcg by mouth daily.       No current facility-administered medications on file prior to visit.    Allergies  Allergen Reactions  . Vytorin [Ezetimibe-Simvastatin] Nausea Only    patient nor wife does not remember    Past Medical History:  Diagnosis Date  . Anemia   . Anxiety   . [place]   . Depression    recurrent and severe  . Diabetes mellitus    Type II with nephropathy  . GI bleed   . HLD (hyperlipidemia)   . Hypertension   . Hypothyroidism   . Internal hemorrhoid   .  Osteoarthritis   . Personal history of colonic adenoma 12/03/2012    Past Surgical History:  Procedure Laterality Date  . COLONOSCOPY    . COLONOSCOPY WITH PROPOFOL N/A 12/20/2016   Procedure: COLONOSCOPY WITH PROPOFOL;  Surgeon: Jonathon Bellows, MD;  Location: [facility] ENDOSCOPY;  Service: Endoscopy;  Laterality: N/A;  . ESOPHAGOGASTRODUODENOSCOPY (EGD) WITH PROPOFOL N/A 12/17/2016   Procedure: ESOPHAGOGASTRODUODENOSCOPY (EGD) WITH PROPOFOL;  Surgeon: Wilford Corner, MD;  Location: [facility] ENDOSCOPY;  Service: Endoscopy;  Laterality: N/A;  . FLEXIBLE SIGMOIDOSCOPY N/A 12/17/2016   Procedure: FLEXIBLE SIGMOIDOSCOPY;  Surgeon: Wilford Corner, MD;  Location: [facility] ENDOSCOPY;  Service: Endoscopy;  Laterality: N/A;  . HEMORRHOID BANDING  04/2017  . HEMORRHOID SURGERY N/A 06/14/2017   Procedure: HEMORRHOIDECTOMY;  Surgeon: Christene Lye, MD;  Location: [facility] ORS;  Service: General;  Laterality: N/A;  . HERNIA REPAIR    . JOINT REPLACEMENT Bilateral   . PROCTOSCOPY N/A 06/14/2017   Procedure: PROCTOSCOPY;  Surgeon: Christene Lye, MD;  Location: [facility] ORS;  Service: General;  Laterality: N/A;  . TOTAL KNEE ARTHROPLASTY  11/09   Right-Dr. Clair Gulling ([facility])  . TOTAL KNEE ARTHROPLASTY  6/10   Left-Dr. Clair Gulling ([facility])  . UMBILICAL HERNIA REPAIR  2000    Family History  Problem Relation Age of Onset  . Heart failure Father   . Diabetes Father   . Depression Father   . Coronary artery disease Father   . Breast cancer Sister 78  . Lung cancer Sister   . Depression Sister   . Stomach cancer Mother 54  . Stroke Other        Paternal side  . Parkinsonism Other        Cousin  . Colon cancer Neg Hx   . Rectal cancer Neg Hx   . Esophageal cancer Neg Hx   . Colon polyps Neg Hx     Social History   Socioeconomic History  . Marital status: Married    Spouse name: [name]  . Number of children: 2  . Years of education: Not on file  . Highest education level: Not on file  Occupational History  . Occupation: Retired  Tobacco Use  . Smoking status: Never Smoker  . Smokeless tobacco: Former Systems developer    Types: Secondary school teacher  . Vaping Use: Never used  Substance and Sexual Activity  . Alcohol use: No    Alcohol/week: 0.0 standard drinks  . Drug use: No  . Sexual activity: Not on file  Other Topics Concern  . Not on file  Social History Narrative   Has living will.   Wife is formal health care POA.  Daughter [name] is alternate   Would accept resuscitation attempts   No tube feeds if cognitively unaware   Social Determinants of Health   Financial Resource Strain:   . Difficulty of Paying Living  Expenses:   Food Insecurity:   . Worried About Charity fundraiser in the Last Year:   . Arboriculturist in the Last Year:   Transportation Needs:   . Film/video editor (Medical):   Marland Kitchen Lack of Transportation (Non-Medical):   Physical Activity:   . Days of Exercise per Week:   . Minutes of Exercise per Session:   Stress:   . Feeling of Stress :   Social Connections:   . Frequency of Communication with Friends and Family:   . Frequency of Social Gatherings with Friends and Family:   . Attends Religious Services:   .  Active Member of Clubs or Organizations:   . Attends Archivist Meetings:   Marland Kitchen Marital Status:   Intimate Partner Violence:   . Fear of Current or Ex-Partner:   . Emotionally Abused:   Marland Kitchen Physically Abused:   . 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 Just "trickles". Does wear pull ups GFR 54    Objective:   Physical Exam  Constitutional: No distress.  Cardiovascular: Normal rate and regular rhythm. Exam reveals no gallop.  No murmur heard. Faint pedal pulses  Respiratory: Effort normal and breath sounds normal. He has no wheezes. He has no rales.  GI: Normal appearance.  Musculoskeletal:        General: No swelling.     Right lower leg: No edema.     Left lower leg: No edema.     Comments: Needs help getting up and down from table  Lymphadenopathy:    He has no cervical adenopathy.  Neurological: He is alert.  Skin: No rash noted.  No foot lesions  Psychiatric: His behavior is normal. Mood normal.           Assessment & Plan:

## 2020-02-10 NOTE — Assessment & Plan Note (Signed)
Doing okay on lexapro, ritalin and zyrexa

## 2020-02-10 NOTE — Assessment & Plan Note (Signed)
Increased symptoms Not ready for meds---will start tamsulosin if worsens

## 2020-03-16 ENCOUNTER — Other Ambulatory Visit: Payer: Self-pay | Admitting: Internal Medicine

## 2020-03-17 ENCOUNTER — Other Ambulatory Visit: Payer: Self-pay | Admitting: Internal Medicine

## 2020-03-17 NOTE — Telephone Encounter (Signed)
Last filled 01-22-20 #60 Last OV 02-10-20 Next OV 08-17-20 [place] to [facility] in Dr Alla German absence

## 2020-03-24 ENCOUNTER — Other Ambulatory Visit: Payer: Self-pay | Admitting: Internal Medicine

## 2020-03-24 MED ORDER — PREGABALIN 25 MG PO CAPS: 25.0000 mg | ORAL_CAPSULE | Freq: Three times a day (TID) | ORAL | 1 refills | Status: DC

## 2020-04-07 ENCOUNTER — Other Ambulatory Visit: Payer: Self-pay | Admitting: Internal Medicine

## 2020-05-01 ENCOUNTER — Ambulatory Visit: Payer: Self-pay

## 2020-05-18 ENCOUNTER — Other Ambulatory Visit: Payer: Self-pay | Admitting: Internal Medicine

## 2020-05-19 NOTE — Telephone Encounter (Signed)
Last filled 03-17-20 #60 Last OV 02-10-20 Next OV 08-17-20 [place]

## 2020-05-22 ENCOUNTER — Ambulatory Visit: Payer: Self-pay

## 2020-07-20 ENCOUNTER — Other Ambulatory Visit: Payer: Self-pay | Admitting: Internal Medicine

## 2020-07-20 NOTE — Telephone Encounter (Signed)
Last filled 05-18-20 #60 Last OV 02-10-20 Next OV 08-17-20 Randy Mcdaniel

## 2020-08-17 ENCOUNTER — Encounter: Payer: Self-pay | Admitting: Internal Medicine

## 2020-08-17 ENCOUNTER — Other Ambulatory Visit: Payer: Self-pay

## 2020-08-17 ENCOUNTER — Ambulatory Visit (INDEPENDENT_AMBULATORY_CARE_PROVIDER_SITE_OTHER): Payer: Medicare PPO | Admitting: Internal Medicine

## 2020-08-17 VITALS — BP 120/68 | HR 81 | Temp 97.7°F | Ht 69.0 in | Wt 207.0 lb

## 2020-08-17 DIAGNOSIS — F3341 Major depressive disorder, recurrent, in partial remission: Secondary | ICD-10-CM

## 2020-08-17 DIAGNOSIS — N401 Enlarged prostate with lower urinary tract symptoms: Secondary | ICD-10-CM | POA: Diagnosis not present

## 2020-08-17 DIAGNOSIS — E538 Deficiency of other specified B group vitamins: Secondary | ICD-10-CM | POA: Diagnosis not present

## 2020-08-17 DIAGNOSIS — E039 Hypothyroidism, unspecified: Secondary | ICD-10-CM | POA: Diagnosis not present

## 2020-08-17 DIAGNOSIS — Z0000 Encounter for general adult medical examination without abnormal findings: Secondary | ICD-10-CM | POA: Diagnosis not present

## 2020-08-17 DIAGNOSIS — Z23 Encounter for immunization: Secondary | ICD-10-CM | POA: Diagnosis not present

## 2020-08-17 DIAGNOSIS — I739 Peripheral vascular disease, unspecified: Secondary | ICD-10-CM

## 2020-08-17 DIAGNOSIS — R3912 Poor urinary stream: Secondary | ICD-10-CM

## 2020-08-17 DIAGNOSIS — E1149 Type 2 diabetes mellitus with other diabetic neurological complication: Secondary | ICD-10-CM | POA: Diagnosis not present

## 2020-08-17 DIAGNOSIS — N1831 Chronic kidney disease, stage 3a: Secondary | ICD-10-CM | POA: Diagnosis not present

## 2020-08-17 LAB — HM DIABETES FOOT EXAM

## 2020-08-17 MED ORDER — TAMSULOSIN HCL 0.4 MG PO CAPS
0.4000 mg | ORAL_CAPSULE | Freq: Every day | ORAL | 3 refills | Status: DC
Start: 2020-08-17 — End: 2021-09-03

## 2020-08-17 MED ORDER — GABAPENTIN 300 MG PO CAPS: 600.0000 mg | ORAL_CAPSULE | Freq: Every day | ORAL | 0 refills | Status: DC

## 2020-08-17 NOTE — Assessment & Plan Note (Signed)
Controlled with escitalopram, olanzapine and methylphenidate

## 2020-08-17 NOTE — Assessment & Plan Note (Signed)
Seems to be euthyroid on levothyroxine 

## 2020-08-17 NOTE — Progress Notes (Signed)
Subjective:    Patient ID: Randy Livings., male    DOB: 1941/04/28, 79 y.o.   MRN: [id number]  HPI Here with wife for Medicare wellness visit and follow up of chronic health conditions This visit occurred during the SARS-CoV-2 public health emergency.  Safety protocols were in place, including screening questions prior to the visit, additional usage of staff PPE, and extensive cleaning of exam room while observing appropriate contact time as indicated for disinfecting solutions.   Reviewed advanced directives Reviewed other doctors--Dr Patterson---dentist, Dr George Ina ---ophthal Vision is okay--overdue for exam Hearing is off--no aides (bought some from [employer]) No hospitalizations or surgery Fell off bed once--no injury Chronic depression Still won't do any exercise Wife does all the housework--he has never done it [place] with walker Wife helps with shower.  No incontinence Memory seems to be okay  Depression is stable [place]oved [place] a retirement community--condo. This is working out  Checks sugars daily 120-130 lyrica is helping for nerve pain--taking bid. Also taking gabapentin and OTC healthy feet at bedtime (couldn't take gabapentin in day due to sedation)  No chest pain or SOB No dizziness or syncope No edema No palpitations No headaches  Last GFR 54  Has slow stream--urgency then can't go Nocturia only once  Current Outpatient Medications on File Prior to Visit  Medication Sig Dispense Refill  . Accu-Chek Softclix Lancets lancets Use to obtain blood sample. E11.49 100 each 12  . acetaminophen (TYLENOL) 650 MG CR tablet Take 650 mg by mouth every 8 (eight) hours as needed for pain.     Marland Kitchen atorvastatin (LIPITOR) 20 MG tablet TAKE 1 TABLET BY MOUTH ONCE DAILY 90 tablet 3  . Blood Glucose Monitoring Suppl (ACCU-CHEK AVIVA PLUS) w/Device KIT Use to check blood sugar daily 1 kit 0  . escitalopram (LEXAPRO) 20 MG tablet TAKE 2 TABLETS BY MOUTH EVERY MORNING 180 tablet 3   . glipiZIDE (GLUCOTROL) 5 MG tablet TAKE 1 TABLET BY MOUTH TWICE (2) DAILY BEFORE A MEAL 180 tablet 3  . glucose blood (ACCU-CHEK AVIVA PLUS) test strip Use to check blood sugars daily. Dx E11.49 100 each 3  . hydrocortisone-pramoxine (ANALPRAM HC) 2.5-1 % rectal cream Place 1 application rectally 2 (two) times daily. (Patient taking differently: Place 1 application rectally 2 (two) times daily as needed.) 30 g 0  . Lancets (ACCU-CHEK SOFT TOUCH) lancets Use to check blood sugars daily 100 each 3  . levothyroxine (SYNTHROID) 75 MCG tablet TAKE 1 TABLET BY MOUTH ONCE DAILY 90 tablet 3  . lisinopril (ZESTRIL) 10 MG tablet TAKE 1 TABLET BY MOUTH ONCE DAILY 90 tablet 3  . metFORMIN (GLUCOPHAGE) 500 MG tablet TAKE 1 TABLET BY MOUTH 3 TIMES DAILY 270 tablet 3  . methylphenidate (RITALIN) 5 MG tablet TAKE 1 TABLET BY MOUTH TWICE (2) DAILY (Patient taking differently: Take 5 mg by mouth daily.) 60 tablet 0  . OLANZapine (ZYPREXA) 10 MG tablet TAKE ONE TABLET BY MOUTH EVERY NIGHT AT BEDTIME 30 tablet 11  . pregabalin (LYRICA) 25 MG capsule TAKE 1 CAPSULE BY MOUTH 3 TIMES A DAY (Patient taking differently: Take 25 mg by mouth 2 (two) times daily.) 90 capsule 5  . sennosides-docusate sodium (SENOKOT-S) 8.6-50 MG tablet Take 2 tablets by mouth daily as needed.     . vitamin B-12 (CYANOCOBALAMIN) 1000 MCG tablet Take 1,000 mcg by mouth daily.     No current facility-administered medications on file prior to visit.    Allergies  Allergen Reactions  .  Vytorin [Ezetimibe-Simvastatin] Nausea Only    patient nor wife does not remember    Past Medical History:  Diagnosis Date  . Anemia   . Anxiety   . Dementia ([place])   . Depression    recurrent and severe  . Diabetes mellitus    Type II with nephropathy  . GI bleed   . HLD (hyperlipidemia)   . Hypertension   . Hypothyroidism   . Internal hemorrhoid   . Osteoarthritis   . Personal history of colonic adenoma 12/03/2012    Past Surgical History:   Procedure Laterality Date  . COLONOSCOPY    . COLONOSCOPY WITH PROPOFOL N/A 12/20/2016   Procedure: COLONOSCOPY WITH PROPOFOL;  Surgeon: Jonathon Bellows, MD;  Location: [facility] ENDOSCOPY;  Service: Endoscopy;  Laterality: N/A;  . ESOPHAGOGASTRODUODENOSCOPY (EGD) WITH PROPOFOL N/A 12/17/2016   Procedure: ESOPHAGOGASTRODUODENOSCOPY (EGD) WITH PROPOFOL;  Surgeon: Wilford Corner, MD;  [facility] ENDOSCOPY;  Service: Endoscopy;  Laterality: N/A;  . FLEXIBLE SIGMOIDOSCOPY N/A 12/17/2016   Procedure: FLEXIBLE SIGMOIDOSCOPY;  Surgeon: Wilford Corner, MD;  Location: [facility] ENDOSCOPY;  Service: Endoscopy;  Laterality: N/A;  . HEMORRHOID BANDING  04/2017  . HEMORRHOID SURGERY N/A 06/14/2017   Procedure: HEMORRHOIDECTOMY;  Surgeon: Christene Lye, MD;  Location: [facility] ORS;  Service: General;  Laterality: N/A;  . HERNIA REPAIR    . JOINT REPLACEMENT Bilateral   . PROCTOSCOPY N/A 06/14/2017   Procedure: PROCTOSCOPY;  Surgeon: Christene Lye, MD;  Location: [facility] ORS;  Service: General;  Laterality: N/A;  . TOTAL KNEE ARTHROPLASTY  11/09   Right-Dr. Clair Gulling ([facility])  . TOTAL KNEE ARTHROPLASTY  6/10   Left-Dr. Clair Gulling ([facility])  . UMBILICAL HERNIA REPAIR  2000    Family History  Problem Relation Age of Onset  . Heart failure Father   . Diabetes Father   . Depression Father   . Coronary artery disease Father   . Breast cancer Sister 80  . Lung cancer Sister   . Depression Sister   . Stomach cancer Mother 30  . Stroke Other        Paternal side  . Parkinsonism Other        Cousin  . Colon cancer Neg Hx   . Rectal cancer Neg Hx   . Esophageal cancer Neg Hx   . Colon polyps Neg Hx     Social History   Socioeconomic History  . Marital status: Married    Spouse name: [name]  . Number of children: 2  . Years of education: Not on file  . Highest education level: Not on file  Occupational History  . Occupation: Retired  Tobacco Use  . Smoking status: Never Smoker  . Smokeless tobacco:  Former Systems developer    Types: Secondary school teacher  . Vaping Use: Never used  Substance and Sexual Activity  . Alcohol use: No    Alcohol/week: 0.0 standard drinks  . Drug use: No  . Sexual activity: Not on file  Other Topics Concern  . Not on file  Social History Narrative   Has living will.   Wife is formal health care POA.  Daughter [name] is alternate   Would accept resuscitation attempts   No tube feeds if cognitively unaware   Social Determinants of Health   Financial Resource Strain: Not on file  Food Insecurity: Not on file  Transportation Needs: Not on file  Physical Activity: Not on file  Stress: Not on file  Social Connections: Not on file  Intimate Partner Violence:  Not on file   Review of Systems No sig back pain. Occasional shoulder pain Some constipation---then diarrhea Appetite is okay Has regained some weight--discussed Wears seat belt Teeth are okay---recent dentist visit No suspicious skin lesions No heartburn or dysphagia     Objective:   Physical Exam Constitutional:      Appearance: Normal appearance.  HENT:     Mouth/Throat:     Comments: No lesions Upper denture Eyes:     Conjunctiva/sclera: Conjunctivae normal.     Pupils: Pupils are equal, round, and reactive to light.  Cardiovascular:     Rate and Rhythm: Normal rate and regular rhythm.     Heart sounds: No murmur heard. No gallop.      Comments: Absent pedal pulses Pulmonary:     Effort: Pulmonary effort is normal.     Breath sounds: No wheezing.     Comments: Some coarse crackles at right base Abdominal:     Palpations: Abdomen is soft.     Tenderness: There is no abdominal tenderness.  Skin:    Findings: No rash.     Comments: Feet cold and no pulses No ulcers  Neurological:     Mental Status: He is alert and oriented to person, place, and time.     Comments: President--- "Waymond Cera, Obama" [phone] Recall 2/3  Little sensation in feet   Psychiatric:        Mood and Affect: Mood normal.        Behavior: Behavior normal.            Assessment & Plan:

## 2020-08-17 NOTE — Assessment & Plan Note (Signed)
Seems to still have good control on metformin and glipizide Will check A1c Neuropathy better with lyrica in AM, gabapentin at night (600)

## 2020-08-17 NOTE — Assessment & Plan Note (Signed)
Having more problems Will try tamsulosin

## 2020-08-17 NOTE — Progress Notes (Signed)
Hearing Screening   Method: Audiometry   125Hz  250Hz  500Hz  1000Hz  2000Hz  3000Hz  4000Hz  6000Hz  8000Hz   Right ear:   40 40 40  0    Left ear:   40 0 40  0      Visual Acuity Screening   Right eye Left eye Both eyes  Without correction:     With correction: 20/20 20/20 20/20

## 2020-08-17 NOTE — Assessment & Plan Note (Signed)
Continues on oral supplement

## 2020-08-17 NOTE — Assessment & Plan Note (Signed)
Clear decrease in circulation to feet No obvious pain other than the neuropathy Will hold off on testing unless symptoms

## 2020-08-17 NOTE — Addendum Note (Signed)
Addended by: Pilar Grammes on: 08/17/2020 04:19 PM   Modules accepted: Orders

## 2020-08-17 NOTE — Assessment & Plan Note (Signed)
GFR 54  Is on lisinopril

## 2020-08-17 NOTE — Assessment & Plan Note (Signed)
I have personally reviewed the Medicare Annual Wellness questionnaire and have noted 1. The patient's medical and social history 2. Their use of alcohol, tobacco or illicit drugs 3. Their current medications and supplements 4. The patient's functional ability including ADL's, fall risks, home safety risks and hearing or visual             impairment. 5. Diet and physical activities 6. Evidence for depression or mood disorders  The patients weight, height, BMI and visual acuity have been recorded in the chart I have made referrals, counseling and provided education to the patient based review of the above and I have provided the pt with a written personalized care plan for preventive services.  I have provided you with a copy of your personalized plan for preventive services. Please take the time to review along with your updated medication list.  No cancer screening due to age 79 for COVID booster Flu vaccine yearly Urged him to do some exercise--even chair exercises

## 2020-08-18 LAB — CBC
HCT: 38.9 % — ABNORMAL LOW (ref 39.0–52.0)
Hemoglobin: 12.9 g/dL — ABNORMAL LOW (ref 13.0–17.0)
MCHC: 33 g/dL (ref 30.0–36.0)
MCV: 91.5 fl (ref 78.0–100.0)
Platelets: 264 10*3/uL (ref 150.0–400.0)
RBC: 4.26 Mil/uL (ref 4.22–5.81)
RDW: 14 % (ref 11.5–15.5)
WBC: 6.8 10*3/uL (ref 4.0–10.5)

## 2020-08-18 LAB — LIPID PANEL
Cholesterol: 140 mg/dL (ref 0–200)
HDL: 46.9 mg/dL (ref >39.00)
LDL Cholesterol: 64 mg/dL (ref 0–99)
NonHDL: 93.55
Total CHOL/HDL Ratio: 3
Triglycerides: 150 mg/dL — ABNORMAL HIGH (ref 0.0–149.0)
VLDL: 30 mg/dL (ref 0.0–40.0)

## 2020-08-18 LAB — RENAL FUNCTION PANEL
Albumin: 4.2 g/dL (ref 3.5–5.2)
BUN: 23 mg/dL (ref 6–23)
CO2: 30 mEq/L (ref 19–32)
Calcium: 9.6 mg/dL (ref 8.4–10.5)
Chloride: 105 mEq/L (ref 96–112)
Creatinine, Ser: 1.18 mg/dL (ref 0.40–1.50)
GFR: 58.17 mL/min — ABNORMAL LOW (ref >60.00)
Glucose, Bld: 114 mg/dL — ABNORMAL HIGH (ref 70–99)
Phosphorus: 3.2 mg/dL (ref 2.3–4.6)
Potassium: 4.5 mEq/L (ref 3.5–5.1)
Sodium: 141 mEq/L (ref 135–145)

## 2020-08-18 LAB — HEMOGLOBIN A1C: Hgb A1c MFr Bld: 7.5 % — ABNORMAL HIGH (ref 4.6–6.5)

## 2020-08-18 LAB — HEPATIC FUNCTION PANEL
ALT: 16 U/L (ref 0–53)
AST: 24 U/L (ref 0–37)
Albumin: 4.2 g/dL (ref 3.5–5.2)
Alkaline Phosphatase: 75 U/L (ref 39–117)
Bilirubin, Direct: 0.1 mg/dL (ref 0.0–0.3)
Total Bilirubin: 0.4 mg/dL (ref 0.2–1.2)
Total Protein: 6.9 g/dL (ref 6.0–8.3)

## 2020-08-18 LAB — T4, FREE: Free T4: 0.77 ng/dL (ref 0.60–1.60)

## 2020-08-18 LAB — TSH: TSH: 7.9 u[IU]/mL — ABNORMAL HIGH (ref 0.35–4.50)

## 2020-08-18 LAB — VITAMIN B12: Vitamin B-12: >1526 pg/mL — ABNORMAL HIGH (ref 211–911)

## 2020-09-15 ENCOUNTER — Other Ambulatory Visit: Payer: Self-pay | Admitting: Internal Medicine

## 2020-09-15 NOTE — Telephone Encounter (Signed)
Last filled9-20-21 #60 Last OV 08-17-20 Next OV 01-27-21 [place]

## 2020-11-03 ENCOUNTER — Other Ambulatory Visit: Payer: Self-pay | Admitting: Family Medicine

## 2020-11-03 ENCOUNTER — Other Ambulatory Visit: Payer: Self-pay | Admitting: Internal Medicine

## 2020-11-17 ENCOUNTER — Other Ambulatory Visit: Payer: Self-pay | Admitting: Family Medicine

## 2020-11-17 ENCOUNTER — Other Ambulatory Visit: Payer: Self-pay | Admitting: Internal Medicine

## 2020-11-17 NOTE — Telephone Encounter (Signed)
Last filled 09-15-20 #60 Last OV 08-17-20 Next OV 01-27-21 [place]

## 2020-12-15 ENCOUNTER — Other Ambulatory Visit: Payer: Self-pay | Admitting: Internal Medicine

## 2020-12-15 NOTE — Telephone Encounter (Signed)
Last written 05-19-20 #90/1 Last OV 08-17-20 Next OV 01-27-21 [place]

## 2020-12-30 DIAGNOSIS — E119 Type 2 diabetes mellitus without complications: Secondary | ICD-10-CM | POA: Diagnosis not present

## 2021-01-05 ENCOUNTER — Other Ambulatory Visit: Payer: Self-pay | Admitting: Family Medicine

## 2021-01-05 NOTE — Telephone Encounter (Signed)
Pharmacy requests refill on: Metformin 500 mg   LAST REFILL: 11/19/2019 (Q-270, R-3) LAST OV: 08/17/2020 NEXT OV: 01/27/2021 PHARMACY: [facility]  Hgb A1C (08/17/2020): 7.5

## 2021-01-19 ENCOUNTER — Other Ambulatory Visit: Payer: Self-pay | Admitting: Internal Medicine

## 2021-01-19 NOTE — Telephone Encounter (Signed)
Refill request Ritalin Last refill 11/17/20 Last office visit 08/17/20 Upcoming appointment 01/27/21

## 2021-01-27 ENCOUNTER — Ambulatory Visit: Payer: Medicare PPO | Admitting: Internal Medicine

## 2021-02-23 ENCOUNTER — Other Ambulatory Visit: Payer: Self-pay | Admitting: Internal Medicine

## 2021-02-23 NOTE — Telephone Encounter (Signed)
Rx failed. Please send again. Thanks

## 2021-02-23 NOTE — Addendum Note (Signed)
Addended by: Pilar Grammes on: 02/23/2021 02:37 PM   Modules accepted: Orders

## 2021-02-23 NOTE — Telephone Encounter (Signed)
Last filled 01-19-21 #60 Last OV 08-17-20 Next OV 02-26-21 [place]

## 2021-02-24 ENCOUNTER — Ambulatory Visit: Payer: Medicare PPO | Attending: Critical Care Medicine

## 2021-02-24 DIAGNOSIS — Z23 Encounter for immunization: Secondary | ICD-10-CM

## 2021-02-24 MED ORDER — METHYLPHENIDATE HCL 5 MG PO TABS: ORAL_TABLET | ORAL | 0 refills | Status: DC

## 2021-02-24 NOTE — Progress Notes (Signed)
   [id number]  Name:  Randy Mcdaniel.    MRN: [id number] DOB: 1941-07-07  02/24/2021  Randy Mcdaniel was observed post Covid-19 immunization for 15 minutes without incident. He was provided with Vaccine Information Sheet and instruction to access the V-Safe system.   Randy Mcdaniel was instructed to call 911 with any severe reactions post vaccine: Difficulty breathing  Swelling of face and throat  A fast heartbeat  A bad rash all over body  Dizziness and weakness   Immunizations Administered     Name Date Dose VIS Date Route   Moderna Covid-19 Booster Vaccine 02/24/2021 11:09 AM 0.25 mL 06/17/2020 Intramuscular   Manufacturer: Moderna   Lot: [id number]   [place]: [id number]

## 2021-02-24 NOTE — Addendum Note (Signed)
Addended by: Viviana Simpler I on: 02/24/2021 07:33 AM   Modules accepted: Orders

## 2021-02-26 ENCOUNTER — Other Ambulatory Visit: Payer: Self-pay

## 2021-02-26 ENCOUNTER — Encounter: Payer: Self-pay | Admitting: Internal Medicine

## 2021-02-26 ENCOUNTER — Ambulatory Visit: Payer: Medicare PPO | Admitting: Internal Medicine

## 2021-02-26 VITALS — BP 110/62 | HR 109 | Temp 99.6°F | Ht 69.0 in | Wt 207.5 lb

## 2021-02-26 DIAGNOSIS — E1149 Type 2 diabetes mellitus with other diabetic neurological complication: Secondary | ICD-10-CM | POA: Diagnosis not present

## 2021-02-26 DIAGNOSIS — F3341 Major depressive disorder, recurrent, in partial remission: Secondary | ICD-10-CM | POA: Diagnosis not present

## 2021-02-26 DIAGNOSIS — K5909 Other constipation: Secondary | ICD-10-CM

## 2021-02-26 LAB — POCT GLYCOSYLATED HEMOGLOBIN (HGB A1C): Hemoglobin A1C: 7.8 % — AB (ref 4.0–5.6)

## 2021-02-26 NOTE — Assessment & Plan Note (Signed)
Urged him to not use the imodium

## 2021-02-26 NOTE — Assessment & Plan Note (Signed)
Lab Results  Component Value Date   HGBA1C 7.8 (A) 02/26/2021   Still acceptable control on the glipizide Will try to wean the lyrica  On gabapentin also

## 2021-02-26 NOTE — Progress Notes (Signed)
Subjective:    Patient ID: Randy Livings., male    DOB: Feb 29, 1941, 80 y.o.   MRN: [id number]  HPI Here with wife for follow up of diabetes and other chronic medical conditions This visit occurred during the SARS-CoV-2 public health emergency.  Safety protocols were in place, including screening questions prior to the visit, additional usage of staff PPE, and extensive cleaning of exam room while observing appropriate contact time as indicated for disinfecting solutions.   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 E11.49 100 each 12   acetaminophen (TYLENOL) 650 MG CR tablet Take 650 mg by mouth every 8 (eight) hours as needed for pain.      atorvastatin (LIPITOR) 20 MG tablet TAKE 1 TABLET BY MOUTH ONCE DAILY 90 tablet 1   Blood Glucose Monitoring Suppl (ACCU-CHEK AVIVA PLUS) w/Device KIT Use to check blood sugar daily 1 kit 0   escitalopram (LEXAPRO) 20 MG tablet TAKE 2 TABLETS BY MOUTH EVERY MORNING 180 tablet 3   gabapentin (NEURONTIN) 300 MG capsule Take 2 capsules (600 mg total) by mouth at bedtime. 1 capsule 0   glipiZIDE (GLUCOTROL) 5 MG tablet TAKE 1 TABLET BY MOUTH TWICE (2) DAILY BEFORE A MEAL 180 tablet 3   glucose blood (ACCU-CHEK AVIVA PLUS)  test strip Use to check blood sugars daily. Dx E11.49 100 each 3   hydrocortisone-pramoxine (ANALPRAM HC) 2.5-1 % rectal cream Place 1 application rectally 2 (two) times daily. (Patient taking differently: Place 1 application rectally 2 (two) times daily as needed.) 30 g 0   Lancets (ACCU-CHEK SOFT TOUCH) lancets Use to check blood sugars daily 100 each 3   levothyroxine (SYNTHROID) 75 MCG tablet TAKE 1 TABLET BY MOUTH ONCE DAILY 90 tablet 3   lisinopril (ZESTRIL) 10 MG tablet TAKE 1 TABLET BY MOUTH ONCE DAILY 90 tablet 3   metFORMIN (GLUCOPHAGE) 500 MG tablet TAKE 1 TABLET BY MOUTH 3 TIMES DAILY 270 tablet 0   methylphenidate (RITALIN) 5 MG tablet TAKE 1 TABLET BY MOUTH TWICE (2) DAILY 60 tablet 0   OLANZapine (ZYPREXA) 10 MG tablet TAKE ONE TABLET BY MOUTH EVERY NIGHT AT BEDTIME 30 tablet 11   pregabalin (LYRICA) 25 MG capsule Take 1 capsule (25 mg total) by mouth 2 (two) times daily. 60 capsule 5   sennosides-docusate sodium (SENOKOT-S) 8.6-50 MG tablet Take 2 tablets by mouth daily as needed.      tamsulosin (FLOMAX) 0.4 MG CAPS capsule Take 1 capsule (0.4 mg total) by mouth daily. 90 capsule 3   vitamin B-12 (CYANOCOBALAMIN) 1000 MCG tablet Take 1,000 mcg by mouth daily.     No current facility-administered medications on file prior to visit.    Allergies  Allergen Reactions   Vytorin [Ezetimibe-Simvastatin]  Nausea Only    patient nor wife does not remember    Past Medical History:  Diagnosis Date   Anemia    Anxiety    Dementia ([place])    Depression    recurrent and severe   Diabetes mellitus    Type II with nephropathy   GI bleed    HLD (hyperlipidemia)    Hypertension    Hypothyroidism    Internal hemorrhoid    Osteoarthritis    Personal history of colonic adenoma 12/03/2012    Past Surgical History:  Procedure Laterality Date   COLONOSCOPY     COLONOSCOPY WITH PROPOFOL N/A 12/20/2016   Procedure: COLONOSCOPY WITH PROPOFOL;  Surgeon: Jonathon Bellows, MD;  Location: [facility]  ENDOSCOPY;  Service: Endoscopy;  Laterality: N/A;   ESOPHAGOGASTRODUODENOSCOPY (EGD) WITH PROPOFOL N/A 12/17/2016   Procedure: ESOPHAGOGASTRODUODENOSCOPY (EGD) WITH PROPOFOL;  Surgeon: Wilford Corner, MD;  Location: [facility] ENDOSCOPY;  Service: Endoscopy;  Laterality: N/A;   FLEXIBLE SIGMOIDOSCOPY N/A 12/17/2016   Procedure: FLEXIBLE SIGMOIDOSCOPY;  Surgeon: Wilford Corner, MD;  Location: [facility] ENDOSCOPY;  Service: Endoscopy;  Laterality: N/A;   HEMORRHOID BANDING  04/2017   HEMORRHOID SURGERY N/A 06/14/2017   Procedure: HEMORRHOIDECTOMY;  Surgeon: Christene Lye, MD;  Location: [facility] ORS;  Service: General;  Laterality: N/A;   HERNIA REPAIR     JOINT REPLACEMENT Bilateral    PROCTOSCOPY N/A 06/14/2017   Procedure: PROCTOSCOPY;  Surgeon: Christene Lye, MD;  Location: [facility] ORS;  Service: General;  Laterality: N/A;   TOTAL KNEE ARTHROPLASTY  11/09   Right-Dr. Clair Gulling ([facility])   TOTAL KNEE ARTHROPLASTY  6/10   Left-Dr. Clair Gulling ([facility])   UMBILICAL HERNIA REPAIR  2000    Family History  Problem Relation Age of Onset   Heart failure Father    Diabetes Father    Depression Father    Coronary artery disease Father    Breast cancer Sister 51   Lung cancer Sister    Depression Sister    Stomach cancer Mother 3   Stroke Other        Paternal side   Parkinsonism Other        Cousin   Colon cancer Neg Hx    Rectal cancer Neg Hx    Esophageal cancer Neg Hx    Colon polyps Neg Hx     Social History   Socioeconomic History   Marital status: Married    Spouse name: [name]   Number of children: 2   Years of education: Not on file   Highest education level: Not on file  Occupational History   Occupation: Retired  Tobacco Use   Smoking status: Never   Smokeless tobacco: Former    Types: Chew    Quit date: 05/15/2002  Vaping Use   Vaping Use: Never used  Substance and Sexual Activity   Alcohol use: No    Alcohol/week: 0.0 standard drinks   Drug use: No   Sexual  activity: Not on file  Other Topics Concern   Not on file  Social History Narrative   Has living will.   Wife is formal health care POA.  Daughter [name] is alternate   Would accept resuscitation attempts   No tube feeds if cognitively unaware   Social Determinants of Health   Financial Resource Strain: Not on file  Food Insecurity: Not on file  Transportation Needs: Not on file  Physical Activity: Not on file  Stress: Not on file  Social Connections: Not on file  Intimate  Partner Violence: Not on file   Review of Systems Did go to the beach for a week---got up the stairs and then never left the place the whole week Eating okay---weight stable Sleeps okay in general---spells of problems. Sleeps a lot in chair    Objective:   Physical Exam Constitutional:      Appearance: Normal appearance.  Cardiovascular:     Rate and Rhythm: Normal rate and regular rhythm.     Heart sounds: No murmur heard.   No gallop.     Comments: Faint pedal pulses Pulmonary:     Effort: Pulmonary effort is normal.     Breath sounds: Normal breath sounds. No wheezing or rales.  Musculoskeletal:     Right lower leg: No edema.     Left lower leg: No edema.  Neurological:     Mental Status: He is alert.     Comments: Can get up himself, but slow. Slow gait--not shuffling  Psychiatric:     Comments: Not clearly depressed but some psychomotor retartdation           Assessment & Plan:

## 2021-02-26 NOTE — Patient Instructions (Signed)
Please stop the morning lyrica. If you have no problems after 2 weeks---stop the evening dose also. In a month, if you are still doing well, cut the olanzapine in half (to 5mg )

## 2021-02-26 NOTE — Assessment & Plan Note (Signed)
Lacks motivation, etc--but non clear exacerbation On escitalopram and ritalin Consider trying to wean olanzapine

## 2021-03-02 ENCOUNTER — Telehealth: Payer: Self-pay

## 2021-03-02 NOTE — Telephone Encounter (Signed)
Message received on triage line to follow up on prior auth that was faxed over requesting that form and office notes be faxed to [phone]

## 2021-03-02 NOTE — Telephone Encounter (Signed)
I have the forms but tried to call the number listed to clarify the validity of the forms as they look a bit off. It seems as though they did not leave a phone number during this call either.

## 2021-03-05 NOTE — Telephone Encounter (Signed)
Called pt's wife to let her know I received the requests, but the sheets looked like ones we get from Medicare frauds. So I had called once and could not get anyone to answer. I will take care of this Monday.

## 2021-03-08 MED ORDER — LINACLOTIDE 72 MCG PO CAPS: 72.0000 ug | ORAL_CAPSULE | Freq: Every day | ORAL | 3 refills | Status: DC

## 2021-03-16 NOTE — Telephone Encounter (Signed)
[facility] (person did not leave a name calling) left v/m requesting status of faxed physician written order form for freestyle libre reader and sensors. Also will need clinical notes. Form was refaxed on 03/12/21. Please fax info requested to [phone]. Use ref # [id number]. Sending note to [facility] CMA.

## 2021-03-17 NOTE — Telephone Encounter (Signed)
I faxed his most recent clinical note with the form, already.

## 2021-03-22 ENCOUNTER — Other Ambulatory Visit: Payer: Self-pay | Admitting: Internal Medicine

## 2021-03-25 DIAGNOSIS — E1149 Type 2 diabetes mellitus with other diabetic neurological complication: Secondary | ICD-10-CM | POA: Diagnosis not present

## 2021-04-05 ENCOUNTER — Other Ambulatory Visit: Payer: Self-pay | Admitting: Internal Medicine

## 2021-05-11 ENCOUNTER — Other Ambulatory Visit: Payer: Self-pay | Admitting: Internal Medicine

## 2021-05-11 NOTE — Telephone Encounter (Signed)
Last filled 02-24-21 #60 Last OV 02-26-21 Next OV 09-03-21 [place]

## 2021-06-25 DIAGNOSIS — E1149 Type 2 diabetes mellitus with other diabetic neurological complication: Secondary | ICD-10-CM | POA: Diagnosis not present

## 2021-06-28 ENCOUNTER — Other Ambulatory Visit: Payer: Self-pay | Admitting: Internal Medicine

## 2021-06-29 NOTE — Telephone Encounter (Signed)
Last filled 05-31-21 #60 Last OV 02-26-21 Next OV 09-03-21 [place]

## 2021-07-06 ENCOUNTER — Other Ambulatory Visit: Payer: Self-pay | Admitting: Internal Medicine

## 2021-07-06 NOTE — Telephone Encounter (Signed)
Last filled 05-11-21 #60 Last OV 02-26-21 Next OV 09-03-21 [place]

## 2021-07-30 ENCOUNTER — Encounter: Payer: Self-pay | Admitting: Internal Medicine

## 2021-08-09 ENCOUNTER — Encounter: Payer: Self-pay | Admitting: Internal Medicine

## 2021-08-09 MED ORDER — EMPAGLIFLOZIN 10 MG PO TABS: 10.0000 mg | ORAL_TABLET | Freq: Every day | ORAL | 5 refills | Status: DC

## 2021-08-30 ENCOUNTER — Other Ambulatory Visit: Payer: Self-pay | Admitting: Internal Medicine

## 2021-08-31 ENCOUNTER — Other Ambulatory Visit: Payer: Self-pay | Admitting: Internal Medicine

## 2021-08-31 NOTE — Telephone Encounter (Signed)
Dr Silvio Pate has already sent the refill in. Epic does not allow me to refuse controlled substances.

## 2021-08-31 NOTE — Telephone Encounter (Signed)
Last filled 07-06-21 #60 Last OV 02-26-21 Next OV 09-03-21 [place]

## 2021-09-03 ENCOUNTER — Encounter: Payer: Self-pay | Admitting: Internal Medicine

## 2021-09-03 ENCOUNTER — Other Ambulatory Visit: Payer: Self-pay

## 2021-09-03 ENCOUNTER — Ambulatory Visit: Payer: Medicare PPO | Admitting: Internal Medicine

## 2021-09-03 VITALS — BP 62/48 | HR 94 | Temp 98.3°F | Ht 69.0 in | Wt 204.0 lb

## 2021-09-03 DIAGNOSIS — K5909 Other constipation: Secondary | ICD-10-CM

## 2021-09-03 DIAGNOSIS — F3341 Major depressive disorder, recurrent, in partial remission: Secondary | ICD-10-CM | POA: Diagnosis not present

## 2021-09-03 DIAGNOSIS — Z23 Encounter for immunization: Secondary | ICD-10-CM

## 2021-09-03 DIAGNOSIS — E1149 Type 2 diabetes mellitus with other diabetic neurological complication: Secondary | ICD-10-CM

## 2021-09-03 DIAGNOSIS — N1831 Chronic kidney disease, stage 3a: Secondary | ICD-10-CM | POA: Diagnosis not present

## 2021-09-03 LAB — POCT GLYCOSYLATED HEMOGLOBIN (HGB A1C): Hemoglobin A1C: 8.5 % — AB (ref 4.0–5.6)

## 2021-09-03 MED ORDER — LINACLOTIDE 145 MCG PO CAPS: 145.0000 ug | ORAL_CAPSULE | Freq: Every day | ORAL | 3 refills | Status: DC

## 2021-09-03 MED ORDER — EMPAGLIFLOZIN 25 MG PO TABS: 25.0000 mg | ORAL_TABLET | Freq: Every day | ORAL | 3 refills | Status: DC

## 2021-09-03 NOTE — Addendum Note (Signed)
Addended by: Pilar Grammes on: 09/03/2021 04:25 PM   Modules accepted: Orders

## 2021-09-03 NOTE — Progress Notes (Signed)
Subjective:    Patient ID: Randy Mcdaniel., male    DOB: 06/09/1941, 81 y.o.   MRN: [id number]  HPI Here with wife for follow up of diabetes and other medical issues  No trouble tolerating jardiance Drinking more water---doesn't notice improvement Checking frequently with the Freestyle Libre GI problems are really better off the metformin Eating fairly healthy---only a few sweets  Lyrica has helped the neuropathy  Feels a bit woozy No true dizziness No chest pain or SOB  Current Outpatient Medications on File Prior to Visit  Medication Sig Dispense Refill   acetaminophen (TYLENOL) 650 MG CR tablet Take 650 mg by mouth every 8 (eight) hours as needed for pain.      atorvastatin (LIPITOR) 20 MG tablet TAKE 1 TABLET BY MOUTH ONCE DAILY 90 tablet 3   empagliflozin (JARDIANCE) 10 MG TABS tablet Take 1 tablet (10 mg total) by mouth daily before breakfast. 30 tablet 5   escitalopram (LEXAPRO) 20 MG tablet TAKE 2 TABLETS BY MOUTH EVERY MORNING 180 tablet 3   gabapentin (NEURONTIN) 300 MG capsule TAKE 3 TO 4 CAPSULES [phone] MG TOTAL)BY MOUTH AT BEDTIME 120 capsule 5   glipiZIDE (GLUCOTROL) 5 MG tablet TAKE 1 TABLET BY MOUTH TWICE (2) DAILY BEFORE A MEAL 180 tablet 3   levothyroxine (SYNTHROID) 75 MCG tablet TAKE 1 TABLET BY MOUTH ONCE DAILY 90 tablet 3   LINZESS 72 MCG capsule TAKE 1 CAPSULE BY MOUTH ONCE DAILY BEFORE BREAKFAST. 30 capsule 3   lisinopril (ZESTRIL) 10 MG tablet TAKE 1 TABLET BY MOUTH ONCE DAILY 90 tablet 3   methylphenidate (RITALIN) 5 MG tablet TAKE 1 TABLET BY MOUTH TWICE A DAY 60 tablet 0   OLANZapine (ZYPREXA) 10 MG tablet TAKE ONE TABLET BY MOUTH EVERY NIGHT AT BEDTIME 30 tablet 11   pregabalin (LYRICA) 25 MG capsule TAKE 1 CAPSULE BY MOUTH TWICE DAILY 60 capsule 5   vitamin B-12 (CYANOCOBALAMIN) 1000 MCG tablet Take 1,000 mcg by mouth daily.     No current facility-administered medications on file prior to visit.    Allergies  Allergen Reactions   Vytorin  [Ezetimibe-Simvastatin] Nausea Only    patient nor wife does not remember    Past Medical History:  Diagnosis Date   Anemia    Anxiety    Dementia ([place])    Depression    recurrent and severe   Diabetes mellitus    Type II with nephropathy   GI bleed    HLD (hyperlipidemia)    Hypertension    Hypothyroidism    Internal hemorrhoid    Osteoarthritis    Personal history of colonic adenoma 12/03/2012    Past Surgical History:  Procedure Laterality Date   COLONOSCOPY     COLONOSCOPY WITH PROPOFOL N/A 12/20/2016   Procedure: COLONOSCOPY WITH PROPOFOL;  Surgeon: Jonathon Bellows, MD;  Location: [facility] ENDOSCOPY;  Service: Endoscopy;  Laterality: N/A;   ESOPHAGOGASTRODUODENOSCOPY (EGD) WITH PROPOFOL N/A 12/17/2016   Procedure: ESOPHAGOGASTRODUODENOSCOPY (EGD) WITH PROPOFOL;  Surgeon: Wilford Corner, MD;  Location: [facility] ENDOSCOPY;  Service: Endoscopy;  Laterality: N/A;   FLEXIBLE SIGMOIDOSCOPY N/A 12/17/2016   Procedure: FLEXIBLE SIGMOIDOSCOPY;  Surgeon: Wilford Corner, MD;  Location: [facility] ENDOSCOPY;  Service: Endoscopy;  Laterality: N/A;   HEMORRHOID BANDING  04/2017   HEMORRHOID SURGERY N/A 06/14/2017   Procedure: HEMORRHOIDECTOMY;  Surgeon: Christene Lye, MD;  Location: [facility] ORS;  Service: General;  Laterality: N/A;   HERNIA REPAIR     JOINT REPLACEMENT Bilateral  PROCTOSCOPY N/A 06/14/2017   Procedure: PROCTOSCOPY;  Surgeon: Christene Lye, MD;  Location: [facility] ORS;  Service: General;  Laterality: N/A;   TOTAL KNEE ARTHROPLASTY  11/09   Right-Dr. Clair Gulling ([facility])   TOTAL KNEE ARTHROPLASTY  6/10   Left-Dr. Clair Gulling ([facility])   UMBILICAL HERNIA REPAIR  2000    Family History  Problem Relation Age of Onset   Heart failure Father    Diabetes Father    Depression Father    Coronary artery disease Father    Breast cancer Sister 3   Lung cancer Sister    Depression Sister    Stomach cancer Mother 61   Stroke Other        Paternal side   Parkinsonism Other        Cousin    Colon cancer Neg Hx    Rectal cancer Neg Hx    Esophageal cancer Neg Hx    Colon polyps Neg Hx     Social History   Socioeconomic History   Marital status: Married    Spouse name: [name]   Number of children: 2   Years of education: Not on file   Highest education level: Not on file  Occupational History   Occupation: Retired  Tobacco Use   Smoking status: Never   Smokeless tobacco: Former    Types: Chew    Quit date: 05/15/2002  Vaping Use   Vaping Use: Never used  Substance and Sexual Activity   Alcohol use: No    Alcohol/week: 0.0 standard drinks   Drug use: No   Sexual activity: Not on file  Other Topics Concern   Not on file  Social History Narrative   Has living will.   Wife is formal health care POA.  Daughter [name] is alternate   Would accept resuscitation attempts   No tube feeds if cognitively unaware   Social Determinants of Health   Financial Resource Strain: Not on file  Food Insecurity: Not on file  Transportation Needs: Not on file  Physical Activity: Not on file  Stress: Not on file  Social Connections: Not on file  Intimate Partner Violence: Not on file   Review of Systems Sleeping okay Appetite is good Bowels are slow --and hard. Even with linzess (is better)     Objective:   Physical Exam Constitutional:      Appearance: Normal appearance.  Cardiovascular:     Rate and Rhythm: Normal rate and regular rhythm.     Comments: Faint pedal pulses Pulmonary:     Effort: Pulmonary effort is normal.     Breath sounds: Normal breath sounds. No wheezing or rales.  Musculoskeletal:     Cervical back: Neck supple.     Right lower leg: No edema.     Left lower leg: No edema.  Lymphadenopathy:     Cervical: No cervical adenopathy.  Neurological:     Mental Status: He is alert.     Comments: Slightly unstable  Psychiatric:        Mood and Affect: Mood normal.        Behavior: Behavior normal.           Assessment & Plan:

## 2021-09-03 NOTE — Assessment & Plan Note (Signed)
Lab Results  Component Value Date   HGBA1C 8.5 (A) 09/03/2021   Still up since off the metformin Will continue the glipizide Tolerates the jardiance so will increase to the 25mg  dose daily Gabapentin and lyrica help with the neuropathy

## 2021-09-03 NOTE — Assessment & Plan Note (Signed)
Ongoing mild depression Reactive symptoms now also cotinues on the escitalopram, olanzapine and methylphenidate

## 2021-09-03 NOTE — Assessment & Plan Note (Signed)
Ongoing issues Will increase the linzess to 145mg  daily

## 2021-09-03 NOTE — Assessment & Plan Note (Signed)
This was better the last time BP low---will stop the lisinopril

## 2021-09-13 DIAGNOSIS — E1149 Type 2 diabetes mellitus with other diabetic neurological complication: Secondary | ICD-10-CM | POA: Diagnosis not present

## 2021-10-22 ENCOUNTER — Encounter: Payer: Self-pay | Admitting: Internal Medicine

## 2021-10-22 MED ORDER — METFORMIN HCL ER (OSM) 500 MG PO TB24: 1000.0000 mg | ORAL_TABLET | Freq: Every day | ORAL | 3 refills | Status: DC

## 2021-10-25 ENCOUNTER — Other Ambulatory Visit: Payer: Self-pay | Admitting: Internal Medicine

## 2021-11-01 ENCOUNTER — Other Ambulatory Visit: Payer: Self-pay | Admitting: Internal Medicine

## 2021-11-08 ENCOUNTER — Other Ambulatory Visit: Payer: Self-pay | Admitting: Internal Medicine

## 2021-12-02 DIAGNOSIS — E1149 Type 2 diabetes mellitus with other diabetic neurological complication: Secondary | ICD-10-CM | POA: Diagnosis not present

## 2021-12-08 ENCOUNTER — Ambulatory Visit (INDEPENDENT_AMBULATORY_CARE_PROVIDER_SITE_OTHER): Payer: Medicare PPO | Admitting: Internal Medicine

## 2021-12-08 ENCOUNTER — Encounter: Payer: Self-pay | Admitting: Internal Medicine

## 2021-12-08 VITALS — BP 128/80 | HR 99 | Ht 69.0 in | Wt 198.7 lb

## 2021-12-08 DIAGNOSIS — F3341 Major depressive disorder, recurrent, in partial remission: Secondary | ICD-10-CM | POA: Diagnosis not present

## 2021-12-08 DIAGNOSIS — N401 Enlarged prostate with lower urinary tract symptoms: Secondary | ICD-10-CM

## 2021-12-08 DIAGNOSIS — E039 Hypothyroidism, unspecified: Secondary | ICD-10-CM

## 2021-12-08 DIAGNOSIS — E1149 Type 2 diabetes mellitus with other diabetic neurological complication: Secondary | ICD-10-CM

## 2021-12-08 DIAGNOSIS — I739 Peripheral vascular disease, unspecified: Secondary | ICD-10-CM

## 2021-12-08 DIAGNOSIS — E538 Deficiency of other specified B group vitamins: Secondary | ICD-10-CM | POA: Diagnosis not present

## 2021-12-08 DIAGNOSIS — Z0000 Encounter for general adult medical examination without abnormal findings: Secondary | ICD-10-CM | POA: Diagnosis not present

## 2021-12-08 DIAGNOSIS — N1831 Chronic kidney disease, stage 3a: Secondary | ICD-10-CM | POA: Diagnosis not present

## 2021-12-08 LAB — CBC
HCT: 45.4 % (ref 39.0–52.0)
Hemoglobin: 14.8 g/dL (ref 13.0–17.0)
MCHC: 32.7 g/dL (ref 30.0–36.0)
MCV: 90.8 fl (ref 78.0–100.0)
Platelets: 267 10*3/uL (ref 150.0–400.0)
RBC: 5 Mil/uL (ref 4.22–5.81)
RDW: 14.6 % (ref 11.5–15.5)
WBC: 7.8 10*3/uL (ref 4.0–10.5)

## 2021-12-08 LAB — RENAL FUNCTION PANEL
Albumin: 4.2 g/dL (ref 3.5–5.2)
BUN: 20 mg/dL (ref 6–23)
CO2: 28 mEq/L (ref 19–32)
Calcium: 10.1 mg/dL (ref 8.4–10.5)
Chloride: 103 mEq/L (ref 96–112)
Creatinine, Ser: 1.35 mg/dL (ref 0.40–1.50)
GFR: 49.05 mL/min — ABNORMAL LOW (ref >60.00)
Glucose, Bld: 196 mg/dL — ABNORMAL HIGH (ref 70–99)
Phosphorus: 3.6 mg/dL (ref 2.3–4.6)
Potassium: 4.5 mEq/L (ref 3.5–5.1)
Sodium: 141 mEq/L (ref 135–145)

## 2021-12-08 LAB — HEPATIC FUNCTION PANEL
ALT: 18 U/L (ref 0–53)
AST: 27 U/L (ref 0–37)
Albumin: 4.2 g/dL (ref 3.5–5.2)
Alkaline Phosphatase: 79 U/L (ref 39–117)
Bilirubin, Direct: 0.1 mg/dL (ref 0.0–0.3)
Total Bilirubin: 0.6 mg/dL (ref 0.2–1.2)
Total Protein: 6.9 g/dL (ref 6.0–8.3)

## 2021-12-08 LAB — VITAMIN B12: Vitamin B-12: >1504 pg/mL — ABNORMAL HIGH (ref 211–911)

## 2021-12-08 LAB — POCT GLYCOSYLATED HEMOGLOBIN (HGB A1C): Hemoglobin A1C: 8.3 % — AB (ref 4.0–5.6)

## 2021-12-08 LAB — LIPID PANEL
Cholesterol: 167 mg/dL (ref 0–200)
HDL: 62.5 mg/dL (ref >39.00)
LDL Cholesterol: 81 mg/dL (ref 0–99)
NonHDL: 104.22
Total CHOL/HDL Ratio: 3
Triglycerides: 118 mg/dL (ref 0.0–149.0)
VLDL: 23.6 mg/dL (ref 0.0–40.0)

## 2021-12-08 LAB — T4, FREE: Free T4: 0.98 ng/dL (ref 0.60–1.60)

## 2021-12-08 LAB — HM DIABETES FOOT EXAM

## 2021-12-08 LAB — TSH: TSH: 3.83 u[IU]/mL (ref 0.35–5.50)

## 2021-12-08 MED ORDER — GABAPENTIN 300 MG PO CAPS: 600.0000 mg | ORAL_CAPSULE | Freq: Every day | ORAL | 0 refills | Status: DC

## 2021-12-08 MED ORDER — OLANZAPINE 5 MG PO TABS: 5.0000 mg | ORAL_TABLET | Freq: Every day | ORAL | 3 refills | Status: DC

## 2021-12-08 MED ORDER — TAMSULOSIN HCL 0.4 MG PO CAPS: 0.4000 mg | ORAL_CAPSULE | Freq: Every day | ORAL | 3 refills | Status: DC

## 2021-12-08 NOTE — Progress Notes (Signed)
? ?Subjective:  ? ? Patient ID: Randy Lynch., male    DOB: 1941-08-29, 81 y.o.   MRN: [id number] ? ?HPI ?Here with wife for Medicare wellness visit and follow up of chronic health conditions ?Reviewed advanced directives ?Reviewed other doctors---Dr Legrand Rams Vision ?Vision is okay ?Hearing is okay--wife not so sure ?No alcohol or tobacco ?No exercise ?No falls--walks with walker ?Chronic depression ?Wife does instrumental ADLs ?Wife notes some memory issues ? ?Doing pretty well ?Checks sugars frequent with Freestyle Libre-can be over 200 in the morning. Lower later in the day ?Neuropathy persists---satisfied with lyrica bid and night gabapentin ? ?Blood pressure better off the lisinopril ?No dizziness now ?No chest pain or SOB ?No edema ?No headaches ? ?Depression controlled ?Continues on the same regimen ? ?Constipation is better on the linzess ?No abdominal pain ? ?Last GFR 58 ? ?Current Outpatient Medications on File Prior to Visit  ?Medication Sig Dispense Refill  ? acetaminophen (TYLENOL) 650 MG CR tablet Take 650 mg by mouth every 8 (eight) hours as needed for pain.     ? atorvastatin (LIPITOR) 20 MG tablet TAKE 1 TABLET BY MOUTH ONCE DAILY 90 tablet 3  ? empagliflozin (JARDIANCE) 25 MG TABS tablet Take 1 tablet (25 mg total) by mouth daily before breakfast. 90 tablet 3  ? escitalopram (LEXAPRO) 20 MG tablet TAKE 2 TABLETS BY MOUTH EVERY MORNING 180 tablet 3  ? glipiZIDE (GLUCOTROL) 5 MG tablet TAKE 1 TABLET BY MOUTH TWICE (2) DAILY BEFORE A MEAL 180 tablet 3  ? levothyroxine (SYNTHROID) 75 MCG tablet TAKE 1 TABLET BY MOUTH ONCE DAILY 90 tablet 0  ? linaclotide (LINZESS) 145 MCG CAPS capsule Take 1 capsule (145 mcg total) by mouth daily before breakfast. 90 capsule 3  ? metformin (FORTAMET) 500 MG (OSM) 24 hr tablet Take 2 tablets (1,000 mg total) by mouth daily with breakfast. 180 tablet 3  ? methylphenidate (RITALIN) 5 MG tablet TAKE 1 TABLET BY MOUTH TWICE A DAY 60 tablet 0  ?  pregabalin (LYRICA) 25 MG capsule TAKE 1 CAPSULE BY MOUTH TWICE DAILY 60 capsule 5  ? vitamin B-12 (CYANOCOBALAMIN) 1000 MCG tablet Take 1,000 mcg by mouth daily.    ? ?No current facility-administered medications on file prior to visit.  ? ? ?Allergies  ?Allergen Reactions  ? Vytorin [Ezetimibe-Simvastatin] Nausea Only  ?  patient nor wife does not remember  ? ? ?Past Medical History:  ?Diagnosis Date  ? Anemia   ? Anxiety   ? Dementia ([place])   ? Depression   ? recurrent and severe  ? Diabetes mellitus   ? Type II with nephropathy  ? GI bleed   ? HLD (hyperlipidemia)   ? Hypertension   ? Hypothyroidism   ? Internal hemorrhoid   ? Osteoarthritis   ? Personal history of colonic adenoma 12/03/2012  ? ? ?Past Surgical History:  ?Procedure Laterality Date  ? COLONOSCOPY    ? COLONOSCOPY WITH PROPOFOL N/A 12/20/2016  ? Procedure: COLONOSCOPY WITH PROPOFOL;  Surgeon: Jonathon Bellows, MD;  [facility] END[facility]COPY;  Service: Endoscopy;  Laterality: N/A;  ? ESOPHAGOGASTRODUODENOSCOPY (EGD) WITH PROPOFOL N/A 12/17/2016  ? Procedure: ESOPHAGOGASTRODUODENOSCOPY (EGD) WITH PROPOFOL;  Surgeon: Wilford Corner, MD;  Location: [facility] ENDOSCOPY;  Service: Endoscopy;  Laterality: N/A;  ? FLEXIBLE SIGMOIDOSCOPY N/A 12/17/2016  ? Procedure: FLEXIBLE SIGMOIDOSCOPY;  Surgeon: Wilford Corner, MD;  Location: [facility] ENDOSCOPY;  Service: Endoscopy;  Laterality: N/A;  ? HEMORRHOID BANDING  04/2017  ? HEMORRHOID SURGERY N/A 06/14/2017  ?  Procedure: HEMORRHOIDECTOMY;  Surgeon: Christene Lye, MD;  Location: [facility] ORS;  Service: General;  Laterality: N/A;  ? HERNIA REPAIR    ? JOINT REPLACEMENT Bilateral   ? PROCTOSCOPY N/A 06/14/2017  ? Procedure: PROCTOSCOPY;  Surgeon: Christene Lye, MD;  Location: [facility] ORS;  Service: General;  Laterality: N/A;  ? TOTAL KNEE ARTHROPLASTY  11/09  ? Right-Dr. Clair Gulling ([facility])  ? TOTAL KNEE ARTHROPLASTY  6/10  ? Left-Dr. Clair Gulling ([facility])  ? UMBILICAL HERNIA REPAIR  2000  ? ? ?Family History  ?Problem Relation Age  of Onset  ? Heart failure Father   ? Diabetes Father   ? Depression Father   ? Coronary artery disease Father   ? Breast cancer Sister 81  ? Lung cancer Sister   ? Depression Sister   ? Stomach cancer Mother 38  ? Stroke Other   ?     Paternal side  ? Parkinsonism Other   ?     Cousin  ? Colon cancer Neg Hx   ? Rectal cancer Neg Hx   ? Esophageal cancer Neg Hx   ? Colon polyps Neg Hx   ? ? ?Social History  ? ?Socioeconomic History  ? Marital status: Married  ?  Spouse name: Randy Mcdaniel  ? Number of children: 2  ? Years of education: Not on file  ? Highest education level: Not on file  ?Occupational History  ? Occupation: Retired  ?Tobacco Use  ? Smoking status: Never  ? Smokeless tobacco: Former  ?  Types: Chew  ?  Quit date: 05/15/2002  ?Vaping Use  ? Vaping Use: Never used  ?Substance and Sexual Activity  ? Alcohol use: No  ?  Alcohol/week: 0.0 standard drinks  ? Drug use: No  ? Sexual activity: Not on file  ?Other Topics Concern  ? Not on file  ?Social History Narrative  ? Has living will.  ? Wife is formal health care POA.  Daughter Randy Mcdaniel is alternate  ? Would accept resuscitation attempts  ? No tube feeds if cognitively unaware  ? ?Social Determinants of Health  ? ?Financial Resource Strain: Not on file  ?Food Insecurity: Not on file  ?Transportation Needs: Not on file  ?Physical Activity: Not on file  ?Stress: Not on file  ?Social Connections: Not on file  ?Intimate Partner Violence: Not on file  ? ?Review of Systems ?Appetite is good ?weight down a few pounds from last time ?Sleeps okay ?Wears seat belt ?Teeth okay---sees dentist ?No suspicious skin lesions ?No heartburn or dysphagia ?Slow urinary stream and dribbling. Not on tamsulosin now. Urgency at night--and then can't void ?No blood in stools ?Some back pain ---with new chairs. No new pains (but sedentary) ?   ?Objective:  ? Physical Exam ?Constitutional:   ?   Appearance: Normal appearance.  ?HENT:  ?   Mouth/Throat:  ?   Comments: No lesions ?Eyes:  ?    Conjunctiva/sclera: Conjunctivae normal.  ?   Pupils: Pupils are equal, round, and reactive to light.  ?Cardiovascular:  ?   Rate and Rhythm: Normal rate and regular rhythm.  ?   Heart sounds: No murmur heard. ?  No gallop.  ?   Comments: Feet cool and no pulses ?Pulmonary:  ?   Effort: Pulmonary effort is normal.  ?   Breath sounds: Normal breath sounds. No wheezing or rales.  ?Abdominal:  ?   Palpations: Abdomen is soft.  ?   Tenderness: There is no abdominal tenderness.  ?Musculoskeletal:  ?  Cervical back: Neck supple.  ?   Right lower leg: No edema.  ?   Left lower leg: No edema.  ?Lymphadenopathy:  ?   Cervical: No cervical adenopathy.  ?Skin: ?   Findings: No rash.  ?   Comments: No foot lesions  ?Neurological:  ?   General: No focal deficit present.  ?   Mental Status: He is alert and oriented to person, place, and time.  ?   Comments: Mini-cog---clock okay but hands show 10:55 instead of 11:10 ?Recall 3/3 ?Decreased sensation in feet  ?Psychiatric:     ?   Mood and Affect: Mood normal.     ?   Behavior: Behavior normal.  ?  ? ? ? ? ?   ?Assessment & Plan:  ? ?

## 2021-12-08 NOTE — Assessment & Plan Note (Signed)
Ongoing cool feet, etc-----no pain (is sedentary) ?On statin--aotrvastatin '20mg'$  daily ?

## 2021-12-08 NOTE — Addendum Note (Signed)
Addended by: Tammi Sou on: 12/08/2021 12:42 PM ? ? Modules accepted: Orders ? ?

## 2021-12-08 NOTE — Assessment & Plan Note (Signed)
Mild ?Off the lisinopril due to hypotension ?

## 2021-12-08 NOTE — Assessment & Plan Note (Signed)
Doing okay with lexapro 20, ritalin 5 bid, zyprexa 5 bedtime ?

## 2021-12-08 NOTE — Assessment & Plan Note (Signed)
I have personally reviewed the Medicare Annual Wellness questionnaire and have noted ?1. The patient's medical and social history ?2. Their use of alcohol, tobacco or illicit drugs ?3. Their current medications and supplements ?4. The patient's functional ability including ADL's, fall risks, home safety risks and hearing or visual ?            impairment. ?5. Diet and physical activities ?6. Evidence for depression or mood disorders ? ?The patients weight, height, BMI and visual acuity have been recorded in the chart ?I have made referrals, counseling and provided education to the patient based review of the above and I have provided the pt with a written personalized care plan for preventive services. ? ?I have provided you with a copy of your personalized plan for preventive services. Please take the time to review along with your updated medication list. ? ?Done with cancer screening ?Urged him to do some activity--but he just won't ?COVID and flu vaccines in the fall ?Can get the shingrix at pharmacy ?

## 2021-12-08 NOTE — Assessment & Plan Note (Addendum)
Lab Results  ?Component Value Date  ? HGBA1C 8.3 (A) 12/08/2021  ? ?Slightly better and acceptable for age ?He is going to increase the metformin to 500 tid though ?Glipizide 5 bid, jardiance '25mg'$  daily ? ?lyrica 25 bid, gabapentin '600mg'$  bedtime for neuropathy ?

## 2021-12-08 NOTE — Assessment & Plan Note (Signed)
Will check labs on levothyroxine 75 mcg daily ?

## 2021-12-08 NOTE — Assessment & Plan Note (Signed)
Symptoms worse ?Will try the tamsulosin 0.'4mg'$  daily again ?

## 2021-12-09 LAB — MICROALBUMIN / CREATININE URINE RATIO
Creatinine,U: 75.5 mg/dL
Microalb Creat Ratio: 1.2 mg/g (ref 0.0–30.0)
Microalb, Ur: 0.9 mg/dL (ref 0.0–1.9)

## 2021-12-27 ENCOUNTER — Other Ambulatory Visit: Payer: Self-pay | Admitting: Internal Medicine

## 2022-01-04 DIAGNOSIS — E119 Type 2 diabetes mellitus without complications: Secondary | ICD-10-CM | POA: Diagnosis not present

## 2022-01-14 ENCOUNTER — Other Ambulatory Visit: Payer: Self-pay | Admitting: Internal Medicine

## 2022-01-17 ENCOUNTER — Telehealth: Payer: Self-pay | Admitting: Internal Medicine

## 2022-01-17 NOTE — Telephone Encounter (Signed)
This was requested through MyChart and Dr Silvio Pate took care of it.

## 2022-01-17 NOTE — Telephone Encounter (Signed)
Encourage patient to contact the pharmacy for refills or they can request refills through [facility]  Did the patient contact the pharmacy:  Yes  LAST APPOINTMENT DATE:  12/08/2021  NEXT APPOINTMENT DATE:  06/14/2022  MEDICATION: methylphenidate (RITALIN) 5 MG tablet  Is the patient out of medication? Yes  If not, how much is left? N/A  Is this a 90 day supply: No  PHARMACY:  [place], [place] Phone Number: [phone]  Let patient know to contact pharmacy at the end of the day to make sure medication is ready.  Please notify patient to allow 48-72 hours to process

## 2022-01-17 NOTE — Telephone Encounter (Signed)
Last written 11-08-21 #60 Last OV 12-08-21 Next OV 06-14-22 [place]

## 2022-01-25 ENCOUNTER — Other Ambulatory Visit: Payer: Self-pay | Admitting: Internal Medicine

## 2022-02-10 ENCOUNTER — Telehealth: Payer: Self-pay

## 2022-02-10 NOTE — Telephone Encounter (Signed)
Received fax from [place] for diabetic testing materials; I spoke with Mrs Clasby and she said pt already uses this type diabetic meter so the pt does want this [employer] diabetic equipment. Sending note to Dr Silvio Pate and fax is in Dr Alla German in box. Mrs Kober said ins covers part of cost and pt takes care of the rest.

## 2022-02-11 NOTE — Telephone Encounter (Signed)
I do not see the forms anywhere. I have been out of the office a few days this week. Im pretty sure someone has faxed it back.

## 2022-02-21 DIAGNOSIS — E1149 Type 2 diabetes mellitus with other diabetic neurological complication: Secondary | ICD-10-CM | POA: Diagnosis not present

## 2022-03-14 ENCOUNTER — Other Ambulatory Visit: Payer: Self-pay | Admitting: Internal Medicine

## 2022-03-14 NOTE — Telephone Encounter (Signed)
Last written 01-17-22 #60 Last OV 12-08-21 Next OV 06-14-22 [place]

## 2022-05-09 ENCOUNTER — Other Ambulatory Visit: Payer: Self-pay | Admitting: Internal Medicine

## 2022-05-09 NOTE — Telephone Encounter (Signed)
Last written 03-14-22 #60 Last OV 12-08-21 Next OV 06-14-22 [place]

## 2022-05-12 DIAGNOSIS — E1149 Type 2 diabetes mellitus with other diabetic neurological complication: Secondary | ICD-10-CM | POA: Diagnosis not present

## 2022-05-30 ENCOUNTER — Other Ambulatory Visit: Payer: Self-pay | Admitting: Internal Medicine

## 2022-06-13 ENCOUNTER — Other Ambulatory Visit: Payer: Self-pay | Admitting: Internal Medicine

## 2022-06-14 ENCOUNTER — Ambulatory Visit: Payer: Medicare PPO | Admitting: Internal Medicine

## 2022-06-14 ENCOUNTER — Encounter: Payer: Self-pay | Admitting: Internal Medicine

## 2022-06-14 VITALS — BP 102/60 | HR 98 | Temp 97.8°F | Ht 69.0 in | Wt 201.0 lb

## 2022-06-14 DIAGNOSIS — K5909 Other constipation: Secondary | ICD-10-CM

## 2022-06-14 DIAGNOSIS — E1149 Type 2 diabetes mellitus with other diabetic neurological complication: Secondary | ICD-10-CM

## 2022-06-14 DIAGNOSIS — Z23 Encounter for immunization: Secondary | ICD-10-CM | POA: Diagnosis not present

## 2022-06-14 DIAGNOSIS — N1831 Chronic kidney disease, stage 3a: Secondary | ICD-10-CM

## 2022-06-14 DIAGNOSIS — F3341 Major depressive disorder, recurrent, in partial remission: Secondary | ICD-10-CM

## 2022-06-14 LAB — POCT GLYCOSYLATED HEMOGLOBIN (HGB A1C): Hemoglobin A1C: 7.6 % — AB (ref 4.0–5.6)

## 2022-06-14 MED ORDER — LINACLOTIDE 145 MCG PO CAPS: 145.0000 ug | ORAL_CAPSULE | ORAL | 0 refills | Status: DC

## 2022-06-14 NOTE — Assessment & Plan Note (Signed)
A1c down to 7.6% with the increase in metformin 500 tid Continue the glipizide 5 bid and jardiance 25 daily lyrica 25 bid/glabapentin 600 bedtime for the neuropathy

## 2022-06-14 NOTE — Progress Notes (Signed)
Subjective:    Patient ID: Randy Livings., male    DOB: [birth year], 81 y.o.   MRN: [id number]  HPI Here with wife for follow up of diabetes and other chronic health issues  Biggest issue is loose stools Gets urgency---but often can't go---but does have 2-3 per day often Then using pepto bismol for that No blood No abdominal pain  Did increase the metformin to '500mg'$  tid Glipizide bid and jardiance Neuropathy is reasonably controlled  Depression has been controlled Satisfied with this  Last GFR 49 Current Outpatient Medications on File Prior to Visit  Medication Sig Dispense Refill   acetaminophen (TYLENOL) 650 MG CR tablet Take 650 mg by mouth every 8 (eight) hours as needed for pain.      atorvastatin (LIPITOR) 20 MG tablet TAKE 1 TABLET BY MOUTH ONCE DAILY 90 tablet 3   empagliflozin (JARDIANCE) 25 MG TABS tablet Take 1 tablet (25 mg total) by mouth daily before breakfast. 90 tablet 3   escitalopram (LEXAPRO) 20 MG tablet TAKE 2 TABLETS BY MOUTH EVERY MORNING 180 tablet 3   gabapentin (NEURONTIN) 300 MG capsule TAKE 3 TO 4 CAPSULES [phone] MG TOTAL)BY MOUTH AT BEDTIME 120 capsule 11   glipiZIDE (GLUCOTROL) 5 MG tablet TAKE 1 TABLET BY MOUTH TWICE (2) DAILY BEFORE A MEAL 180 tablet 3   levothyroxine (SYNTHROID) 75 MCG tablet TAKE 1 TABLET BY MOUTH ONCE DAILY 90 tablet 3   linaclotide (LINZESS) 145 MCG CAPS capsule Take 1 capsule (145 mcg total) by mouth daily before breakfast. 90 capsule 3   metformin (FORTAMET) 500 MG (OSM) 24 hr tablet Take 2 tablets (1,000 mg total) by mouth daily with breakfast. 180 tablet 3   metFORMIN (GLUCOPHAGE) 500 MG tablet TAKE 1 TABLET BY MOUTH 3 TIMES DAILY 270 tablet 3   methylphenidate (RITALIN) 5 MG tablet TAKE 1 TABLET BY MOUTH TWICE A DAY 60 tablet 0   OLANZapine (ZYPREXA) 5 MG tablet Take 1 tablet (5 mg total) by mouth at bedtime. 90 tablet 3   pregabalin (LYRICA) 25 MG capsule TAKE 1 CAPSULE BY MOUTH TWICE DAILY 60 capsule 5    tamsulosin (FLOMAX) 0.4 MG CAPS capsule Take 1 capsule (0.4 mg total) by mouth daily. 90 capsule 3   vitamin B-12 (CYANOCOBALAMIN) 1000 MCG tablet Take 1,000 mcg by mouth daily.     No current facility-administered medications on file prior to visit.    Allergies  Allergen Reactions   Vytorin [Ezetimibe-Simvastatin] Nausea Only    patient nor wife does not remember    Past Medical History:  Diagnosis Date   Anemia    Anxiety    Dementia ([place])    Depression    recurrent and severe   Diabetes mellitus    Type II with nephropathy   GI bleed    HLD (hyperlipidemia)    Hypertension    Hypothyroidism    Internal hemorrhoid    Osteoarthritis    Personal history of colonic adenoma 12/03/2012    Past Surgical History:  Procedure Laterality Date   COLONOSCOPY     COLONOSCOPY WITH PROPOFOL N/A 12/20/2016   Procedure: COLONOSCOPY WITH PROPOFOL;  Surgeon: Jonathon Bellows, MD;  Location: [facility] ENDOSCOPY;  Service: Endoscopy;  Laterality: N/A;   ESOPHAGOGASTRODUODENOSCOPY (EGD) WITH PROPOFOL N/A 12/17/2016   Procedure: ESOPHAGOGASTRODUODENOSCOPY (EGD) WITH PROPOFOL;  Surgeon: Wilford Corner, MD;  Location: [facility] ENDOSCOPY;  Service: Endoscopy;  Laterality: N/A;   FLEXIBLE SIGMOIDOSCOPY N/A 12/17/2016   Procedure: FLEXIBLE SIGMOIDOSCOPY;  Surgeon: Wilford Corner,  MD;  Location: [facility] ENDOSCOPY;  Service: Endoscopy;  Laterality: N/A;   HEMORRHOID BANDING  04/2017   HEMORRHOID SURGERY N/A 06/14/2017   Procedure: HEMORRHOIDECTOMY;  Surgeon: Christene Lye, MD;  Location: [facility] ORS;  Service: General;  Laterality: N/A;   HERNIA REPAIR     JOINT REPLACEMENT Bilateral    PROCTOSCOPY N/A 06/14/2017   Procedure: PROCTOSCOPY;  Surgeon: Christene Lye, MD;  Location: [facility] ORS;  Service: General;  Laterality: N/A;   TOTAL KNEE ARTHROPLASTY  11/09   Right-Dr. Clair Gulling ([facility])   TOTAL KNEE ARTHROPLASTY  6/10   Left-Dr. Clair Gulling ([facility])   UMBILICAL HERNIA REPAIR  2000    Family History   Problem Relation Age of Onset   Heart failure Father    Diabetes Father    Depression Father    Coronary artery disease Father    Breast cancer Sister 59   Lung cancer Sister    Depression Sister    Stomach cancer Mother 51   Stroke Other        Paternal side   Parkinsonism Other        Cousin   Colon cancer Neg Hx    Rectal cancer Neg Hx    Esophageal cancer Neg Hx    Colon polyps Neg Hx     Social History   Socioeconomic History   Marital status: Married    Spouse name: [name]   Number of children: 2   Years of education: Not on file   Highest education level: Not on file  Occupational History   Occupation: Retired  Tobacco Use   Smoking status: Never    Passive exposure: Past   Smokeless tobacco: Former    Types: Chew    Quit date: 05/15/2002  Vaping Use   Vaping Use: Never used  Substance and Sexual Activity   Alcohol use: No    Alcohol/week: 0.0 standard drinks of alcohol   Drug use: No   Sexual activity: Not on file  Other Topics Concern   Not on file  Social History Narrative   Has living will.   Wife is formal health care POA.  Daughter [name] is alternate   Would accept resuscitation attempts   No tube feeds if cognitively unaware   Social Determinants of Health   Financial Resource Strain: Not on file  Food Insecurity: Not on file  Transportation Needs: Not on file  Physical Activity: Not on file  Stress: Not on file  Social Connections: Not on file  Intimate Partner Violence: Not on file   Review of Systems Appetite is off some---due to the bowel changes Weight is stable    Objective:   Physical Exam         Assessment & Plan:

## 2022-06-14 NOTE — Assessment & Plan Note (Signed)
Loose bowels sound like it is from metformin Will have him decrease the linzess to 145 every other day (and change to 72 if that works better) If persists, will decrease the metformin to 1000 daily ---and XR

## 2022-06-14 NOTE — Addendum Note (Signed)
Addended by: Pilar Grammes on: 06/14/2022 10:17 AM   Modules accepted: Orders

## 2022-06-14 NOTE — Patient Instructions (Addendum)
We will decrease the linzess to '145mg'$  every other day (if that works, when you finish this bottle I can prescribe the lower dose) If the loose stools continue, cut the metformin to '500mg'$  just twice a day.

## 2022-06-14 NOTE — Assessment & Plan Note (Signed)
Off the lisinopril due to dizziness  But still on the jardiance

## 2022-06-14 NOTE — Assessment & Plan Note (Addendum)
Mood has been good on the olanzapine 5/ritalin 5 bid/lexapro 40

## 2022-07-04 ENCOUNTER — Other Ambulatory Visit: Payer: Self-pay | Admitting: Internal Medicine

## 2022-07-04 NOTE — Telephone Encounter (Signed)
Last written 12-27-21 #60/5 Last OV 06-14-22 Next OV 12-14-22 [facility]

## 2022-07-11 ENCOUNTER — Other Ambulatory Visit: Payer: Self-pay | Admitting: Internal Medicine

## 2022-07-11 NOTE — Telephone Encounter (Signed)
Last written 05-09-22 #60 Last OV 06-14-22 Next OV 12-14-22 [place]

## 2022-08-01 DIAGNOSIS — E1149 Type 2 diabetes mellitus with other diabetic neurological complication: Secondary | ICD-10-CM | POA: Diagnosis not present

## 2022-08-30 ENCOUNTER — Other Ambulatory Visit: Payer: Self-pay | Admitting: Internal Medicine

## 2022-09-12 ENCOUNTER — Other Ambulatory Visit: Payer: Self-pay | Admitting: Internal Medicine

## 2022-09-12 NOTE — Telephone Encounter (Signed)
Last written 07-11-22 #60 Last OV 06-14-22 Next OV 12-14-22 [place]

## 2022-10-17 ENCOUNTER — Other Ambulatory Visit: Payer: Self-pay | Admitting: Internal Medicine

## 2022-10-30 DIAGNOSIS — E1149 Type 2 diabetes mellitus with other diabetic neurological complication: Secondary | ICD-10-CM | POA: Diagnosis not present

## 2022-10-31 ENCOUNTER — Other Ambulatory Visit: Payer: Self-pay | Admitting: Internal Medicine

## 2022-10-31 NOTE — Addendum Note (Signed)
Addended by: Pilar Grammes on: 10/31/2022 01:08 PM   Modules accepted: Orders

## 2022-10-31 NOTE — Telephone Encounter (Signed)
Prescription Request  10/31/2022  LOV: 06/14/2022  What is the name of the medication or equipment?  methylphenidate (RITALIN) 5 MG tablet  Have you contacted your pharmacy to request a refill? No   Which pharmacy would you like this sent to?  [facility] [address] [place] [address] Phone: [phone] Fax: [phone]    Patient notified that their request is being sent to the clinical staff for review and that they should receive a response within 2 business days.   Please advise at Mobile [phone] (mobile)

## 2022-10-31 NOTE — Telephone Encounter (Signed)
Last written 09-12-22 #60 Last OV 06-14-22 Next OV 12-14-22 [place]: Able to accept controlled meds electronically, again.

## 2022-11-01 ENCOUNTER — Other Ambulatory Visit: Payer: Self-pay | Admitting: Nurse Practitioner

## 2022-11-01 MED ORDER — METHYLPHENIDATE HCL 5 MG PO TABS: 5.0000 mg | ORAL_TABLET | Freq: Two times a day (BID) | ORAL | 0 refills | Status: DC

## 2022-11-01 NOTE — Telephone Encounter (Signed)
The system will not allow me to print it. Matt, do you mind doing this and I will call the pt to pick-up. Thanks!

## 2022-11-01 NOTE — Telephone Encounter (Signed)
Spoke to pt's wife. She stated he was not completely out, yet. She will send a message at the end of the week to see if the refill will go electronically. If not, she will come get the written rx next week.

## 2022-11-01 NOTE — Addendum Note (Signed)
Addended by: Pilar Grammes on: 11/01/2022 08:43 AM   Modules accepted: Orders

## 2022-11-07 ENCOUNTER — Other Ambulatory Visit: Payer: Self-pay

## 2022-11-07 ENCOUNTER — Encounter: Payer: Self-pay | Admitting: Oncology

## 2022-11-07 ENCOUNTER — Other Ambulatory Visit: Payer: Self-pay | Admitting: Nurse Practitioner

## 2022-11-07 ENCOUNTER — Other Ambulatory Visit: Payer: Self-pay | Admitting: Internal Medicine

## 2022-11-07 MED ORDER — METHYLPHENIDATE HCL 5 MG PO TABS
5.0000 mg | ORAL_TABLET | Freq: Two times a day (BID) | ORAL | 0 refills | Status: DC
  Filled 2022-11-07: qty 60, 30d supply, fill #0

## 2022-11-07 MED ORDER — METHYLPHENIDATE HCL 5 MG PO TABS: 5.0000 mg | ORAL_TABLET | Freq: Two times a day (BID) | ORAL | 0 refills | Status: DC

## 2022-11-07 NOTE — Telephone Encounter (Signed)
Wants to see if it will go through electronically instead of picking it up.

## 2022-11-08 NOTE — Telephone Encounter (Signed)
They are still having issues with our rxs. I have called and left a message for wife, Tessie Fass, that I placed the written rx up front for her to pickup since the pharmacy continues to have problems.  It will not allow me to refuse the rx since it is controlled.

## 2022-11-08 NOTE — Telephone Encounter (Signed)
Randy Mcdaniel called stating they never received the prescription & is asking can it be resent? Call back # [phone]

## 2022-11-08 NOTE — Telephone Encounter (Signed)
It will not allow me to cancel or refuse it since it is controlled. Rx was sent yesterday.

## 2022-11-09 ENCOUNTER — Other Ambulatory Visit: Payer: Self-pay

## 2022-11-15 ENCOUNTER — Encounter: Payer: Self-pay | Admitting: Internal Medicine

## 2022-11-15 NOTE — Telephone Encounter (Signed)
Spoke to pt's wife. Cough started 11-13-22. Having a deep cough, runny nose. No fever or body aches. Taking some left over benzonatate that is helping. Also taking Mucinex. Not struggling to sleep due to cough. Said he can wait for Dr Silvio Pate to see the message tomorrow. ER precautions given if he starts to have trouble breathing or worsening symptoms. Also gave info on [email or web address] for a virtual visit if needed today.

## 2022-11-16 NOTE — Telephone Encounter (Signed)
Spoke to pt's wife. She said he is feeling okay. Not nearly as bad as he felt when he had Covid in the past. Would rather not take something if it isn't needed. She said if he takes a turn for the worst, she will call us for a virtual visit to get medication.

## 2022-11-16 NOTE — Telephone Encounter (Signed)
Left a message on VM to see if wife thinks he needs the antiviral. If so, call and schedule a virtual visit. Also asked for an update if she thinks he does not need the antiviral.

## 2022-11-28 ENCOUNTER — Other Ambulatory Visit: Payer: Self-pay | Admitting: Internal Medicine

## 2022-12-14 ENCOUNTER — Ambulatory Visit: Payer: Medicare PPO | Admitting: Internal Medicine

## 2022-12-14 ENCOUNTER — Encounter: Payer: Self-pay | Admitting: Internal Medicine

## 2022-12-14 VITALS — BP 132/72 | HR 114 | Temp 98.1°F | Ht 69.0 in | Wt 200.0 lb

## 2022-12-14 DIAGNOSIS — E1149 Type 2 diabetes mellitus with other diabetic neurological complication: Secondary | ICD-10-CM

## 2022-12-14 DIAGNOSIS — R269 Unspecified abnormalities of gait and mobility: Secondary | ICD-10-CM | POA: Diagnosis not present

## 2022-12-14 DIAGNOSIS — I739 Peripheral vascular disease, unspecified: Secondary | ICD-10-CM

## 2022-12-14 DIAGNOSIS — F3341 Major depressive disorder, recurrent, in partial remission: Secondary | ICD-10-CM

## 2022-12-14 LAB — POCT GLYCOSYLATED HEMOGLOBIN (HGB A1C): Hemoglobin A1C: 8.2 % — AB (ref 4.0–5.6)

## 2022-12-14 NOTE — Assessment & Plan Note (Signed)
Lab Results  Component Value Date   HGBA1C 8.2 (A) 12/14/2022   Control worse---due to poor eating---he will improve Continue the jardiance 10, metformin 500 tid and glipizide 5 mg No new meds for now

## 2022-12-14 NOTE — Progress Notes (Signed)
Subjective:    Patient ID: Randy Mcdaniel., male    DOB: 1941-08-14, 82 y.o.   MRN: [id number]  HPI Here with wife for follow up of diabetes and other chronic health conditions  Having more trouble walking---balance Uses the walker--but still has trouble just getting out of a chair Uses shower chair ---does it himself Wife helps with dressing Independent with bathroom--no incontinence Does have foot pain at night---affects his sleep at times  Bowels some better Still on metformin 500 tid Sugars running a little high---not that compliant with proper eating  Depression is okay Still has episodic problems per wife  Current Outpatient Medications on File Prior to Visit  Medication Sig Dispense Refill   acetaminophen (TYLENOL) 650 MG CR tablet Take 650 mg by mouth every 8 (eight) hours as needed for pain.      atorvastatin (LIPITOR) 20 MG tablet TAKE 1 TABLET BY MOUTH ONCE DAILY 90 tablet 3   empagliflozin (JARDIANCE) 25 MG TABS tablet TAKE 1 TABLET BY MOUTH ONCE A DAY BEFOREBREAKFAST 100 tablet 3   escitalopram (LEXAPRO) 20 MG tablet TAKE 2 TABLETS BY MOUTH EVERY MORNING 180 tablet 3   gabapentin (NEURONTIN) 300 MG capsule TAKE 3 TO 4 CAPSULES [phone] MG TOTAL)BY MOUTH AT BEDTIME 120 capsule 11   glipiZIDE (GLUCOTROL) 5 MG tablet TAKE 1 TABLET BY MOUTH TWICE (2) DAILY BEFORE A MEAL 180 tablet 3   levothyroxine (SYNTHROID) 75 MCG tablet TAKE 1 TABLET BY MOUTH ONCE DAILY 90 tablet 3   linaclotide (LINZESS) 145 MCG CAPS capsule Take 1 capsule (145 mcg total) by mouth every other day. 1 capsule 0   metFORMIN (GLUCOPHAGE) 500 MG tablet TAKE 1 TABLET BY MOUTH 3 TIMES DAILY 270 tablet 3   OLANZapine (ZYPREXA) 5 MG tablet Take 1 tablet (5 mg total) by mouth at bedtime. 90 tablet 3   pregabalin (LYRICA) 25 MG capsule TAKE ONE CAPSULE BY MOUTH TWICE DAILY 60 capsule 5   tamsulosin (FLOMAX) 0.4 MG CAPS capsule Take 1 capsule (0.4 mg total) by mouth daily. 90 capsule 3   vitamin B-12  (CYANOCOBALAMIN) 1000 MCG tablet Take 1,000 mcg by mouth daily.     [DISCONTINUED] methylphenidate (RITALIN) 5 MG tablet Take 1 tablet (5 mg total) by mouth 2 (two) times daily. 60 tablet 0   No current facility-administered medications on file prior to visit.    Allergies  Allergen Reactions   Vytorin [Ezetimibe-Simvastatin] Nausea Only    patient nor wife does not remember    Past Medical History:  Diagnosis Date   Anemia    Anxiety    Dementia    Depression    recurrent and severe   Diabetes mellitus    Type II with nephropathy   GI bleed    HLD (hyperlipidemia)    Hypertension    Hypothyroidism    Internal hemorrhoid    Osteoarthritis    Personal history of colonic adenoma 12/03/2012    Past Surgical History:  Procedure Laterality Date   COLONOSCOPY     COLONOSCOPY WITH PROPOFOL N/A 12/20/2016   Procedure: COLONOSCOPY WITH PROPOFOL;  Surgeon: Wyline Mood, MD;  Location: [facility] ENDOSCOPY;  Service: Endoscopy;  Laterality: N/A;   ESOPHAGOGASTRODUODENOSCOPY (EGD) WITH PROPOFOL N/A 12/17/2016   Procedure: ESOPHAGOGASTRODUODENOSCOPY (EGD) WITH PROPOFOL;  Surgeon: Charlott Rakes, MD;  Location[facility] ENDOSCOPY;  Service: Endoscopy;  Laterality: N/A;   FLEXIBLE SIGMOIDOSCOPY N/A 12/17/2016   Procedure: FLEXIBLE SIGMOIDOSCOPY;  Surgeon: Charlott Rakes, MD;  Location: [facility] ENDOSCOPY;  Service:  Endoscopy;  Laterality: N/A;   HEMORRHOID BANDING  04/2017   HEMORRHOID SURGERY N/A 06/14/2017   Procedure: HEMORRHOIDECTOMY;  Surgeon: Kieth Brightly, MD;  Location: [facility] ORS;  Service: General;  Laterality: N/A;   HERNIA REPAIR     JOINT REPLACEMENT Bilateral    PROCTOSCOPY N/A 06/14/2017   Procedure: PROCTOSCOPY;  Surgeon: Kieth Brightly, MD;  Location: [facility] ORS;  Service: General;  Laterality: N/A;   TOTAL KNEE ARTHROPLASTY  11/09   Right-Dr. Remer Macho ([facility])   TOTAL KNEE ARTHROPLASTY  6/10   Left-Dr. Remer Macho ([facility])   UMBILICAL HERNIA REPAIR  2000    Family History   Problem Relation Age of Onset   Heart failure Father    Diabetes Father    Depression Father    Coronary artery disease Father    Breast cancer Sister 32   Lung cancer Sister    Depression Sister    Stomach cancer Mother 39   Stroke Other        Paternal side   Parkinsonism Other        Cousin   Colon cancer Neg Hx    Rectal cancer Neg Hx    Esophageal cancer Neg Hx    Colon polyps Neg Hx     Social History   Socioeconomic History   Marital status: Married    Spouse name: [name]   Number of children: 2   Years of education: Not on file   Highest education level: Not on file  Occupational History   Occupation: Retired  Tobacco Use   Smoking status: Never    Passive exposure: Past   Smokeless tobacco: Former    Types: Chew    Quit date: 05/15/2002  Vaping Use   Vaping Use: Never used  Substance and Sexual Activity   Alcohol use: No    Alcohol/week: 0.0 standard drinks of alcohol   Drug use: No   Sexual activity: Not on file  Other Topics Concern   Not on file  Social History Narrative   Has living will.   Wife is formal health care POA.  Daughter [name] is alternate   Would accept resuscitation attempts   No tube feeds if cognitively unaware   Social Determinants of Health   Financial Resource Strain: Not on file  Food Insecurity: Not on file  Transportation Needs: Not on file  Physical Activity: Not on file  Stress: Not on file  Social Connections: Not on file  Intimate Partner Violence: Not on file   Review of Systems Not sleeping well---"habits out of control" per wife Doesn't get to sleep till 3AM---sleeps till 3PM at times    Objective:   Physical Exam Constitutional:      Appearance: Normal appearance.  Cardiovascular:     Rate and Rhythm: Normal rate and regular rhythm.     Heart sounds: No murmur heard.    No gallop.     Comments: Feet warm--very faint pulses Pulmonary:     Effort: Pulmonary effort is normal.     Breath sounds: Normal  breath sounds. No wheezing or rales.  Musculoskeletal:     Cervical back: Neck supple.  Lymphadenopathy:     Cervical: No cervical adenopathy.  Skin:    Comments: Dependent rubor in feet--delayed refill No lesions  Neurological:     Mental Status: He is alert.     Comments: Decreased sensation in feet Overall leg weakness and poor balance  Assessment & Plan:

## 2022-12-14 NOTE — Assessment & Plan Note (Signed)
No pain with limited walking

## 2022-12-14 NOTE — Assessment & Plan Note (Signed)
No major worsening but sig sleep disturbance Discussed that he needs to get out of bed regularly at 10AM or so Limit time in bed Continue the escitalopram 20, olanzapine 5

## 2022-12-14 NOTE — Assessment & Plan Note (Signed)
Largely due to neuropathy--despite the lyrica and gabapentin Also with leg weakness Will refer for PT/OT at home (very hard for him to get out)

## 2022-12-15 DIAGNOSIS — E1151 Type 2 diabetes mellitus with diabetic peripheral angiopathy without gangrene: Secondary | ICD-10-CM | POA: Diagnosis not present

## 2022-12-15 DIAGNOSIS — F0284 Dementia in other diseases classified elsewhere, unspecified severity, with anxiety: Secondary | ICD-10-CM | POA: Diagnosis not present

## 2022-12-15 DIAGNOSIS — I10 Essential (primary) hypertension: Secondary | ICD-10-CM | POA: Diagnosis not present

## 2022-12-15 DIAGNOSIS — E1149 Type 2 diabetes mellitus with other diabetic neurological complication: Secondary | ICD-10-CM | POA: Diagnosis not present

## 2022-12-15 DIAGNOSIS — E1121 Type 2 diabetes mellitus with diabetic nephropathy: Secondary | ICD-10-CM | POA: Diagnosis not present

## 2022-12-15 DIAGNOSIS — F3341 Major depressive disorder, recurrent, in partial remission: Secondary | ICD-10-CM | POA: Diagnosis not present

## 2022-12-15 DIAGNOSIS — F0283 Dementia in other diseases classified elsewhere, unspecified severity, with mood disturbance: Secondary | ICD-10-CM | POA: Diagnosis not present

## 2022-12-15 DIAGNOSIS — M1990 Unspecified osteoarthritis, unspecified site: Secondary | ICD-10-CM | POA: Diagnosis not present

## 2022-12-15 DIAGNOSIS — E039 Hypothyroidism, unspecified: Secondary | ICD-10-CM | POA: Diagnosis not present

## 2022-12-16 ENCOUNTER — Telehealth: Payer: Self-pay | Admitting: Internal Medicine

## 2022-12-16 DIAGNOSIS — I10 Essential (primary) hypertension: Secondary | ICD-10-CM | POA: Diagnosis not present

## 2022-12-16 DIAGNOSIS — E1151 Type 2 diabetes mellitus with diabetic peripheral angiopathy without gangrene: Secondary | ICD-10-CM | POA: Diagnosis not present

## 2022-12-16 DIAGNOSIS — F0284 Dementia in other diseases classified elsewhere, unspecified severity, with anxiety: Secondary | ICD-10-CM | POA: Diagnosis not present

## 2022-12-16 DIAGNOSIS — F0283 Dementia in other diseases classified elsewhere, unspecified severity, with mood disturbance: Secondary | ICD-10-CM | POA: Diagnosis not present

## 2022-12-16 DIAGNOSIS — M1990 Unspecified osteoarthritis, unspecified site: Secondary | ICD-10-CM | POA: Diagnosis not present

## 2022-12-16 DIAGNOSIS — E1121 Type 2 diabetes mellitus with diabetic nephropathy: Secondary | ICD-10-CM | POA: Diagnosis not present

## 2022-12-16 DIAGNOSIS — F3341 Major depressive disorder, recurrent, in partial remission: Secondary | ICD-10-CM | POA: Diagnosis not present

## 2022-12-16 DIAGNOSIS — E1149 Type 2 diabetes mellitus with other diabetic neurological complication: Secondary | ICD-10-CM | POA: Diagnosis not present

## 2022-12-16 DIAGNOSIS — E039 Hypothyroidism, unspecified: Secondary | ICD-10-CM | POA: Diagnosis not present

## 2022-12-16 NOTE — Telephone Encounter (Signed)
Home Health verbal orders Caller Name:Soni Agency Name: [facility]  Callback nuHaynes [phone]  Requesting OT/PT/Skilled nursing/Social Work/Speech:PT  Reason:strength training,balance training,gate and transfer  Frequency:1wk7  Please forward to [facility] pool or providers CMA

## 2022-12-19 ENCOUNTER — Telehealth: Payer: Self-pay | Admitting: Internal Medicine

## 2022-12-19 NOTE — Telephone Encounter (Signed)
Home Health verbal orders Caller Name: Judeth Corn[place], [place]  Agency Name: [facility]   Callback number: [phone]  Requesting OT/PT/Skilled nursing/Social Work/Speech: Occupational Therapy  Reason: Increase abilities with self care tasks   Frequency: 1/wk 6   Please forward to [facility] pool or providers CMA

## 2022-12-19 NOTE — Telephone Encounter (Signed)
Verbal orders given to Soni. 

## 2022-12-19 NOTE — Telephone Encounter (Signed)
Left verbal orders on confirmed VM for [facility].

## 2022-12-20 DIAGNOSIS — I10 Essential (primary) hypertension: Secondary | ICD-10-CM | POA: Diagnosis not present

## 2022-12-20 DIAGNOSIS — F0283 Dementia in other diseases classified elsewhere, unspecified severity, with mood disturbance: Secondary | ICD-10-CM | POA: Diagnosis not present

## 2022-12-20 DIAGNOSIS — E1151 Type 2 diabetes mellitus with diabetic peripheral angiopathy without gangrene: Secondary | ICD-10-CM | POA: Diagnosis not present

## 2022-12-20 DIAGNOSIS — E1149 Type 2 diabetes mellitus with other diabetic neurological complication: Secondary | ICD-10-CM | POA: Diagnosis not present

## 2022-12-20 DIAGNOSIS — F3341 Major depressive disorder, recurrent, in partial remission: Secondary | ICD-10-CM | POA: Diagnosis not present

## 2022-12-20 DIAGNOSIS — E1121 Type 2 diabetes mellitus with diabetic nephropathy: Secondary | ICD-10-CM | POA: Diagnosis not present

## 2022-12-20 DIAGNOSIS — E039 Hypothyroidism, unspecified: Secondary | ICD-10-CM | POA: Diagnosis not present

## 2022-12-20 DIAGNOSIS — F0284 Dementia in other diseases classified elsewhere, unspecified severity, with anxiety: Secondary | ICD-10-CM | POA: Diagnosis not present

## 2022-12-20 DIAGNOSIS — M1990 Unspecified osteoarthritis, unspecified site: Secondary | ICD-10-CM | POA: Diagnosis not present

## 2022-12-21 ENCOUNTER — Telehealth: Payer: Self-pay | Admitting: Internal Medicine

## 2022-12-21 DIAGNOSIS — I10 Essential (primary) hypertension: Secondary | ICD-10-CM | POA: Diagnosis not present

## 2022-12-21 DIAGNOSIS — E039 Hypothyroidism, unspecified: Secondary | ICD-10-CM | POA: Diagnosis not present

## 2022-12-21 DIAGNOSIS — F0284 Dementia in other diseases classified elsewhere, unspecified severity, with anxiety: Secondary | ICD-10-CM | POA: Diagnosis not present

## 2022-12-21 DIAGNOSIS — E1121 Type 2 diabetes mellitus with diabetic nephropathy: Secondary | ICD-10-CM | POA: Diagnosis not present

## 2022-12-21 DIAGNOSIS — M1990 Unspecified osteoarthritis, unspecified site: Secondary | ICD-10-CM | POA: Diagnosis not present

## 2022-12-21 DIAGNOSIS — E1151 Type 2 diabetes mellitus with diabetic peripheral angiopathy without gangrene: Secondary | ICD-10-CM | POA: Diagnosis not present

## 2022-12-21 DIAGNOSIS — E1149 Type 2 diabetes mellitus with other diabetic neurological complication: Secondary | ICD-10-CM | POA: Diagnosis not present

## 2022-12-21 DIAGNOSIS — F3341 Major depressive disorder, recurrent, in partial remission: Secondary | ICD-10-CM | POA: Diagnosis not present

## 2022-12-21 DIAGNOSIS — F0283 Dementia in other diseases classified elsewhere, unspecified severity, with mood disturbance: Secondary | ICD-10-CM | POA: Diagnosis not present

## 2022-12-21 NOTE — Telephone Encounter (Signed)
Spoke to pt. Said it started this morning about 10 am. [place] Spaniel he does not feel too bad and will let us know if things change. Gave ER precautions, too.

## 2022-12-21 NOTE — Telephone Encounter (Signed)
Randy Mcdaniel from [facility] called to let Randy Mcdaniel know that today, 4/24, she visited the pt. Randy Mcdaniel stated the pt states after breakfast, he felt nauseous, had diarrhea & a upset stomach. Randy Mcdaniel requested for a nurse or [place] to reach out to the pt? Call back # [phone]

## 2022-12-21 NOTE — Telephone Encounter (Signed)
Randy Mcdaniel also stated the pt's oxygen level was 93

## 2022-12-26 ENCOUNTER — Other Ambulatory Visit: Payer: Self-pay | Admitting: Internal Medicine

## 2022-12-26 DIAGNOSIS — F0283 Dementia in other diseases classified elsewhere, unspecified severity, with mood disturbance: Secondary | ICD-10-CM | POA: Diagnosis not present

## 2022-12-26 DIAGNOSIS — E1149 Type 2 diabetes mellitus with other diabetic neurological complication: Secondary | ICD-10-CM | POA: Diagnosis not present

## 2022-12-26 DIAGNOSIS — E1121 Type 2 diabetes mellitus with diabetic nephropathy: Secondary | ICD-10-CM | POA: Diagnosis not present

## 2022-12-26 DIAGNOSIS — F3341 Major depressive disorder, recurrent, in partial remission: Secondary | ICD-10-CM | POA: Diagnosis not present

## 2022-12-26 DIAGNOSIS — E039 Hypothyroidism, unspecified: Secondary | ICD-10-CM | POA: Diagnosis not present

## 2022-12-26 DIAGNOSIS — I10 Essential (primary) hypertension: Secondary | ICD-10-CM | POA: Diagnosis not present

## 2022-12-26 DIAGNOSIS — F0284 Dementia in other diseases classified elsewhere, unspecified severity, with anxiety: Secondary | ICD-10-CM | POA: Diagnosis not present

## 2022-12-26 DIAGNOSIS — E1151 Type 2 diabetes mellitus with diabetic peripheral angiopathy without gangrene: Secondary | ICD-10-CM | POA: Diagnosis not present

## 2022-12-26 DIAGNOSIS — M1990 Unspecified osteoarthritis, unspecified site: Secondary | ICD-10-CM | POA: Diagnosis not present

## 2022-12-27 DIAGNOSIS — E1149 Type 2 diabetes mellitus with other diabetic neurological complication: Secondary | ICD-10-CM | POA: Diagnosis not present

## 2022-12-27 DIAGNOSIS — I10 Essential (primary) hypertension: Secondary | ICD-10-CM | POA: Diagnosis not present

## 2022-12-27 DIAGNOSIS — F0284 Dementia in other diseases classified elsewhere, unspecified severity, with anxiety: Secondary | ICD-10-CM | POA: Diagnosis not present

## 2022-12-27 DIAGNOSIS — F3341 Major depressive disorder, recurrent, in partial remission: Secondary | ICD-10-CM | POA: Diagnosis not present

## 2022-12-27 DIAGNOSIS — E039 Hypothyroidism, unspecified: Secondary | ICD-10-CM | POA: Diagnosis not present

## 2022-12-27 DIAGNOSIS — M1990 Unspecified osteoarthritis, unspecified site: Secondary | ICD-10-CM | POA: Diagnosis not present

## 2022-12-27 DIAGNOSIS — F0283 Dementia in other diseases classified elsewhere, unspecified severity, with mood disturbance: Secondary | ICD-10-CM | POA: Diagnosis not present

## 2022-12-27 DIAGNOSIS — E1151 Type 2 diabetes mellitus with diabetic peripheral angiopathy without gangrene: Secondary | ICD-10-CM | POA: Diagnosis not present

## 2022-12-27 DIAGNOSIS — E1121 Type 2 diabetes mellitus with diabetic nephropathy: Secondary | ICD-10-CM | POA: Diagnosis not present

## 2022-12-28 DIAGNOSIS — E1151 Type 2 diabetes mellitus with diabetic peripheral angiopathy without gangrene: Secondary | ICD-10-CM | POA: Diagnosis not present

## 2022-12-28 DIAGNOSIS — I10 Essential (primary) hypertension: Secondary | ICD-10-CM | POA: Diagnosis not present

## 2022-12-28 DIAGNOSIS — E1149 Type 2 diabetes mellitus with other diabetic neurological complication: Secondary | ICD-10-CM | POA: Diagnosis not present

## 2022-12-28 DIAGNOSIS — E1121 Type 2 diabetes mellitus with diabetic nephropathy: Secondary | ICD-10-CM | POA: Diagnosis not present

## 2022-12-28 DIAGNOSIS — F0283 Dementia in other diseases classified elsewhere, unspecified severity, with mood disturbance: Secondary | ICD-10-CM | POA: Diagnosis not present

## 2022-12-28 DIAGNOSIS — F0284 Dementia in other diseases classified elsewhere, unspecified severity, with anxiety: Secondary | ICD-10-CM | POA: Diagnosis not present

## 2022-12-28 DIAGNOSIS — M1990 Unspecified osteoarthritis, unspecified site: Secondary | ICD-10-CM | POA: Diagnosis not present

## 2022-12-28 DIAGNOSIS — F3341 Major depressive disorder, recurrent, in partial remission: Secondary | ICD-10-CM | POA: Diagnosis not present

## 2022-12-28 DIAGNOSIS — E039 Hypothyroidism, unspecified: Secondary | ICD-10-CM | POA: Diagnosis not present

## 2023-01-02 ENCOUNTER — Other Ambulatory Visit: Payer: Self-pay | Admitting: Internal Medicine

## 2023-01-02 ENCOUNTER — Other Ambulatory Visit: Payer: Self-pay | Admitting: Nurse Practitioner

## 2023-01-02 DIAGNOSIS — F0283 Dementia in other diseases classified elsewhere, unspecified severity, with mood disturbance: Secondary | ICD-10-CM | POA: Diagnosis not present

## 2023-01-02 DIAGNOSIS — F3341 Major depressive disorder, recurrent, in partial remission: Secondary | ICD-10-CM | POA: Diagnosis not present

## 2023-01-02 DIAGNOSIS — E1149 Type 2 diabetes mellitus with other diabetic neurological complication: Secondary | ICD-10-CM | POA: Diagnosis not present

## 2023-01-02 DIAGNOSIS — I10 Essential (primary) hypertension: Secondary | ICD-10-CM | POA: Diagnosis not present

## 2023-01-02 DIAGNOSIS — M1990 Unspecified osteoarthritis, unspecified site: Secondary | ICD-10-CM | POA: Diagnosis not present

## 2023-01-02 DIAGNOSIS — F0284 Dementia in other diseases classified elsewhere, unspecified severity, with anxiety: Secondary | ICD-10-CM | POA: Diagnosis not present

## 2023-01-02 DIAGNOSIS — E1151 Type 2 diabetes mellitus with diabetic peripheral angiopathy without gangrene: Secondary | ICD-10-CM | POA: Diagnosis not present

## 2023-01-02 DIAGNOSIS — E1121 Type 2 diabetes mellitus with diabetic nephropathy: Secondary | ICD-10-CM | POA: Diagnosis not present

## 2023-01-02 DIAGNOSIS — E039 Hypothyroidism, unspecified: Secondary | ICD-10-CM | POA: Diagnosis not present

## 2023-01-05 DIAGNOSIS — E039 Hypothyroidism, unspecified: Secondary | ICD-10-CM | POA: Diagnosis not present

## 2023-01-05 DIAGNOSIS — F0284 Dementia in other diseases classified elsewhere, unspecified severity, with anxiety: Secondary | ICD-10-CM | POA: Diagnosis not present

## 2023-01-05 DIAGNOSIS — F3341 Major depressive disorder, recurrent, in partial remission: Secondary | ICD-10-CM | POA: Diagnosis not present

## 2023-01-05 DIAGNOSIS — E1121 Type 2 diabetes mellitus with diabetic nephropathy: Secondary | ICD-10-CM | POA: Diagnosis not present

## 2023-01-05 DIAGNOSIS — M1990 Unspecified osteoarthritis, unspecified site: Secondary | ICD-10-CM | POA: Diagnosis not present

## 2023-01-05 DIAGNOSIS — E1151 Type 2 diabetes mellitus with diabetic peripheral angiopathy without gangrene: Secondary | ICD-10-CM | POA: Diagnosis not present

## 2023-01-05 DIAGNOSIS — E1149 Type 2 diabetes mellitus with other diabetic neurological complication: Secondary | ICD-10-CM | POA: Diagnosis not present

## 2023-01-05 DIAGNOSIS — F0283 Dementia in other diseases classified elsewhere, unspecified severity, with mood disturbance: Secondary | ICD-10-CM | POA: Diagnosis not present

## 2023-01-05 DIAGNOSIS — I10 Essential (primary) hypertension: Secondary | ICD-10-CM | POA: Diagnosis not present

## 2023-01-09 DIAGNOSIS — E1121 Type 2 diabetes mellitus with diabetic nephropathy: Secondary | ICD-10-CM | POA: Diagnosis not present

## 2023-01-09 DIAGNOSIS — E1149 Type 2 diabetes mellitus with other diabetic neurological complication: Secondary | ICD-10-CM | POA: Diagnosis not present

## 2023-01-09 DIAGNOSIS — F3341 Major depressive disorder, recurrent, in partial remission: Secondary | ICD-10-CM | POA: Diagnosis not present

## 2023-01-09 DIAGNOSIS — F0283 Dementia in other diseases classified elsewhere, unspecified severity, with mood disturbance: Secondary | ICD-10-CM | POA: Diagnosis not present

## 2023-01-09 DIAGNOSIS — E039 Hypothyroidism, unspecified: Secondary | ICD-10-CM | POA: Diagnosis not present

## 2023-01-09 DIAGNOSIS — E1151 Type 2 diabetes mellitus with diabetic peripheral angiopathy without gangrene: Secondary | ICD-10-CM | POA: Diagnosis not present

## 2023-01-09 DIAGNOSIS — I10 Essential (primary) hypertension: Secondary | ICD-10-CM | POA: Diagnosis not present

## 2023-01-09 DIAGNOSIS — F0284 Dementia in other diseases classified elsewhere, unspecified severity, with anxiety: Secondary | ICD-10-CM | POA: Diagnosis not present

## 2023-01-09 DIAGNOSIS — M1990 Unspecified osteoarthritis, unspecified site: Secondary | ICD-10-CM | POA: Diagnosis not present

## 2023-01-10 DIAGNOSIS — E119 Type 2 diabetes mellitus without complications: Secondary | ICD-10-CM | POA: Diagnosis not present

## 2023-01-10 LAB — HM DIABETES EYE EXAM

## 2023-01-11 DIAGNOSIS — I10 Essential (primary) hypertension: Secondary | ICD-10-CM | POA: Diagnosis not present

## 2023-01-11 DIAGNOSIS — E039 Hypothyroidism, unspecified: Secondary | ICD-10-CM | POA: Diagnosis not present

## 2023-01-11 DIAGNOSIS — F0283 Dementia in other diseases classified elsewhere, unspecified severity, with mood disturbance: Secondary | ICD-10-CM | POA: Diagnosis not present

## 2023-01-11 DIAGNOSIS — E1151 Type 2 diabetes mellitus with diabetic peripheral angiopathy without gangrene: Secondary | ICD-10-CM | POA: Diagnosis not present

## 2023-01-11 DIAGNOSIS — E1121 Type 2 diabetes mellitus with diabetic nephropathy: Secondary | ICD-10-CM | POA: Diagnosis not present

## 2023-01-11 DIAGNOSIS — E1149 Type 2 diabetes mellitus with other diabetic neurological complication: Secondary | ICD-10-CM | POA: Diagnosis not present

## 2023-01-11 DIAGNOSIS — F3341 Major depressive disorder, recurrent, in partial remission: Secondary | ICD-10-CM | POA: Diagnosis not present

## 2023-01-11 DIAGNOSIS — M1990 Unspecified osteoarthritis, unspecified site: Secondary | ICD-10-CM | POA: Diagnosis not present

## 2023-01-11 DIAGNOSIS — F0284 Dementia in other diseases classified elsewhere, unspecified severity, with anxiety: Secondary | ICD-10-CM | POA: Diagnosis not present

## 2023-01-16 ENCOUNTER — Other Ambulatory Visit: Payer: Self-pay | Admitting: Internal Medicine

## 2023-01-17 DIAGNOSIS — F0283 Dementia in other diseases classified elsewhere, unspecified severity, with mood disturbance: Secondary | ICD-10-CM | POA: Diagnosis not present

## 2023-01-17 DIAGNOSIS — I10 Essential (primary) hypertension: Secondary | ICD-10-CM | POA: Diagnosis not present

## 2023-01-17 DIAGNOSIS — F0284 Dementia in other diseases classified elsewhere, unspecified severity, with anxiety: Secondary | ICD-10-CM | POA: Diagnosis not present

## 2023-01-17 DIAGNOSIS — F3341 Major depressive disorder, recurrent, in partial remission: Secondary | ICD-10-CM | POA: Diagnosis not present

## 2023-01-17 DIAGNOSIS — E1151 Type 2 diabetes mellitus with diabetic peripheral angiopathy without gangrene: Secondary | ICD-10-CM | POA: Diagnosis not present

## 2023-01-17 DIAGNOSIS — E1121 Type 2 diabetes mellitus with diabetic nephropathy: Secondary | ICD-10-CM | POA: Diagnosis not present

## 2023-01-17 DIAGNOSIS — E039 Hypothyroidism, unspecified: Secondary | ICD-10-CM | POA: Diagnosis not present

## 2023-01-17 DIAGNOSIS — E1149 Type 2 diabetes mellitus with other diabetic neurological complication: Secondary | ICD-10-CM | POA: Diagnosis not present

## 2023-01-17 DIAGNOSIS — M1990 Unspecified osteoarthritis, unspecified site: Secondary | ICD-10-CM | POA: Diagnosis not present

## 2023-01-19 DIAGNOSIS — I10 Essential (primary) hypertension: Secondary | ICD-10-CM | POA: Diagnosis not present

## 2023-01-19 DIAGNOSIS — E1121 Type 2 diabetes mellitus with diabetic nephropathy: Secondary | ICD-10-CM | POA: Diagnosis not present

## 2023-01-19 DIAGNOSIS — F0284 Dementia in other diseases classified elsewhere, unspecified severity, with anxiety: Secondary | ICD-10-CM | POA: Diagnosis not present

## 2023-01-19 DIAGNOSIS — E039 Hypothyroidism, unspecified: Secondary | ICD-10-CM | POA: Diagnosis not present

## 2023-01-19 DIAGNOSIS — F0283 Dementia in other diseases classified elsewhere, unspecified severity, with mood disturbance: Secondary | ICD-10-CM | POA: Diagnosis not present

## 2023-01-19 DIAGNOSIS — E1151 Type 2 diabetes mellitus with diabetic peripheral angiopathy without gangrene: Secondary | ICD-10-CM | POA: Diagnosis not present

## 2023-01-19 DIAGNOSIS — E1149 Type 2 diabetes mellitus with other diabetic neurological complication: Secondary | ICD-10-CM | POA: Diagnosis not present

## 2023-01-19 DIAGNOSIS — M1990 Unspecified osteoarthritis, unspecified site: Secondary | ICD-10-CM | POA: Diagnosis not present

## 2023-01-19 DIAGNOSIS — F3341 Major depressive disorder, recurrent, in partial remission: Secondary | ICD-10-CM | POA: Diagnosis not present

## 2023-01-20 DIAGNOSIS — F0284 Dementia in other diseases classified elsewhere, unspecified severity, with anxiety: Secondary | ICD-10-CM | POA: Diagnosis not present

## 2023-01-20 DIAGNOSIS — M1990 Unspecified osteoarthritis, unspecified site: Secondary | ICD-10-CM | POA: Diagnosis not present

## 2023-01-20 DIAGNOSIS — E1149 Type 2 diabetes mellitus with other diabetic neurological complication: Secondary | ICD-10-CM | POA: Diagnosis not present

## 2023-01-20 DIAGNOSIS — E1121 Type 2 diabetes mellitus with diabetic nephropathy: Secondary | ICD-10-CM | POA: Diagnosis not present

## 2023-01-20 DIAGNOSIS — E1151 Type 2 diabetes mellitus with diabetic peripheral angiopathy without gangrene: Secondary | ICD-10-CM | POA: Diagnosis not present

## 2023-01-20 DIAGNOSIS — F3341 Major depressive disorder, recurrent, in partial remission: Secondary | ICD-10-CM | POA: Diagnosis not present

## 2023-01-20 DIAGNOSIS — F0283 Dementia in other diseases classified elsewhere, unspecified severity, with mood disturbance: Secondary | ICD-10-CM | POA: Diagnosis not present

## 2023-01-20 DIAGNOSIS — E039 Hypothyroidism, unspecified: Secondary | ICD-10-CM | POA: Diagnosis not present

## 2023-01-20 DIAGNOSIS — I10 Essential (primary) hypertension: Secondary | ICD-10-CM | POA: Diagnosis not present

## 2023-01-24 DIAGNOSIS — I10 Essential (primary) hypertension: Secondary | ICD-10-CM | POA: Diagnosis not present

## 2023-01-24 DIAGNOSIS — M1990 Unspecified osteoarthritis, unspecified site: Secondary | ICD-10-CM | POA: Diagnosis not present

## 2023-01-24 DIAGNOSIS — E1149 Type 2 diabetes mellitus with other diabetic neurological complication: Secondary | ICD-10-CM | POA: Diagnosis not present

## 2023-01-24 DIAGNOSIS — E1151 Type 2 diabetes mellitus with diabetic peripheral angiopathy without gangrene: Secondary | ICD-10-CM | POA: Diagnosis not present

## 2023-01-24 DIAGNOSIS — E1121 Type 2 diabetes mellitus with diabetic nephropathy: Secondary | ICD-10-CM | POA: Diagnosis not present

## 2023-01-24 DIAGNOSIS — F3341 Major depressive disorder, recurrent, in partial remission: Secondary | ICD-10-CM | POA: Diagnosis not present

## 2023-01-24 DIAGNOSIS — F0283 Dementia in other diseases classified elsewhere, unspecified severity, with mood disturbance: Secondary | ICD-10-CM | POA: Diagnosis not present

## 2023-01-24 DIAGNOSIS — F0284 Dementia in other diseases classified elsewhere, unspecified severity, with anxiety: Secondary | ICD-10-CM | POA: Diagnosis not present

## 2023-01-24 DIAGNOSIS — E039 Hypothyroidism, unspecified: Secondary | ICD-10-CM | POA: Diagnosis not present

## 2023-01-28 DIAGNOSIS — E1149 Type 2 diabetes mellitus with other diabetic neurological complication: Secondary | ICD-10-CM | POA: Diagnosis not present

## 2023-01-30 ENCOUNTER — Other Ambulatory Visit: Payer: Self-pay | Admitting: Internal Medicine

## 2023-01-30 ENCOUNTER — Telehealth: Payer: Medicare PPO | Admitting: Internal Medicine

## 2023-01-30 NOTE — Telephone Encounter (Signed)
Pt called in requesting a call back stated he found another Dr . Was want to know the process . Please advise # [phone]

## 2023-02-02 NOTE — Telephone Encounter (Signed)
I called and spoke to pt. Advised him there really is not anything he needs to do besides let us know when he finally sees the new PCP so we can remove Dr Alphonsus Sias. I advised with his health conditions, he will want to wait until he actually sees the new PCP so that he can continue to get his medications are care in between. He stated he understood and would let us know.

## 2023-02-02 NOTE — Telephone Encounter (Signed)
Pt stated he found a new PCP wanted to know if there is anything he needed to do . Wanted a call to discuss with Dr Alphonsus Sias or CMA refused to give any other information .

## 2023-02-06 ENCOUNTER — Encounter: Payer: Self-pay | Admitting: Internal Medicine

## 2023-02-07 ENCOUNTER — Emergency Department: Payer: Medicare PPO

## 2023-02-07 ENCOUNTER — Encounter: Payer: Self-pay | Admitting: Internal Medicine

## 2023-02-07 ENCOUNTER — Inpatient Hospital Stay
Admission: EM | Admit: 2023-02-07 | Discharge: 2023-02-13 | DRG: 193 | Disposition: A | Discharge status: Skilled Nursing Facility | Payer: Medicare PPO | Attending: Internal Medicine | Admitting: Internal Medicine

## 2023-02-07 ENCOUNTER — Other Ambulatory Visit: Payer: Self-pay

## 2023-02-07 DIAGNOSIS — Z79890 Hormone replacement therapy (postmenopausal): Secondary | ICD-10-CM | POA: Diagnosis not present

## 2023-02-07 DIAGNOSIS — R2681 Unsteadiness on feet: Secondary | ICD-10-CM | POA: Diagnosis present

## 2023-02-07 DIAGNOSIS — D631 Anemia in chronic kidney disease: Secondary | ICD-10-CM | POA: Diagnosis not present

## 2023-02-07 DIAGNOSIS — Z7984 Long term (current) use of oral hypoglycemic drugs: Secondary | ICD-10-CM

## 2023-02-07 DIAGNOSIS — Z803 Family history of malignant neoplasm of breast: Secondary | ICD-10-CM

## 2023-02-07 DIAGNOSIS — N400 Enlarged prostate without lower urinary tract symptoms: Secondary | ICD-10-CM | POA: Diagnosis present

## 2023-02-07 DIAGNOSIS — Z801 Family history of malignant neoplasm of trachea, bronchus and lung: Secondary | ICD-10-CM

## 2023-02-07 DIAGNOSIS — R4182 Altered mental status, unspecified: Principal | ICD-10-CM

## 2023-02-07 DIAGNOSIS — Z833 Family history of diabetes mellitus: Secondary | ICD-10-CM | POA: Diagnosis not present

## 2023-02-07 DIAGNOSIS — F0390 Unspecified dementia without behavioral disturbance: Secondary | ICD-10-CM | POA: Diagnosis present

## 2023-02-07 DIAGNOSIS — F331 Major depressive disorder, recurrent, moderate: Secondary | ICD-10-CM | POA: Diagnosis present

## 2023-02-07 DIAGNOSIS — F3341 Major depressive disorder, recurrent, in partial remission: Secondary | ICD-10-CM | POA: Diagnosis present

## 2023-02-07 DIAGNOSIS — J168 Pneumonia due to other specified infectious organisms: Secondary | ICD-10-CM | POA: Diagnosis not present

## 2023-02-07 DIAGNOSIS — K5909 Other constipation: Secondary | ICD-10-CM | POA: Diagnosis not present

## 2023-02-07 DIAGNOSIS — F419 Anxiety disorder, unspecified: Secondary | ICD-10-CM | POA: Diagnosis present

## 2023-02-07 DIAGNOSIS — R059 Cough, unspecified: Secondary | ICD-10-CM | POA: Diagnosis not present

## 2023-02-07 DIAGNOSIS — G9341 Metabolic encephalopathy: Secondary | ICD-10-CM | POA: Diagnosis not present

## 2023-02-07 DIAGNOSIS — N179 Acute kidney failure, unspecified: Secondary | ICD-10-CM | POA: Diagnosis not present

## 2023-02-07 DIAGNOSIS — R2689 Other abnormalities of gait and mobility: Secondary | ICD-10-CM | POA: Diagnosis not present

## 2023-02-07 DIAGNOSIS — Z8249 Family history of ischemic heart disease and other diseases of the circulatory system: Secondary | ICD-10-CM

## 2023-02-07 DIAGNOSIS — E039 Hypothyroidism, unspecified: Secondary | ICD-10-CM | POA: Diagnosis present

## 2023-02-07 DIAGNOSIS — E1142 Type 2 diabetes mellitus with diabetic polyneuropathy: Secondary | ICD-10-CM | POA: Diagnosis present

## 2023-02-07 DIAGNOSIS — J189 Pneumonia, unspecified organism: Principal | ICD-10-CM | POA: Diagnosis present

## 2023-02-07 DIAGNOSIS — Z818 Family history of other mental and behavioral disorders: Secondary | ICD-10-CM | POA: Diagnosis not present

## 2023-02-07 DIAGNOSIS — Z823 Family history of stroke: Secondary | ICD-10-CM

## 2023-02-07 DIAGNOSIS — R0902 Hypoxemia: Secondary | ICD-10-CM | POA: Diagnosis not present

## 2023-02-07 DIAGNOSIS — R278 Other lack of coordination: Secondary | ICD-10-CM | POA: Diagnosis not present

## 2023-02-07 DIAGNOSIS — E1140 Type 2 diabetes mellitus with diabetic neuropathy, unspecified: Secondary | ICD-10-CM | POA: Diagnosis not present

## 2023-02-07 DIAGNOSIS — Z800 Family history of malignant neoplasm of digestive organs: Secondary | ICD-10-CM

## 2023-02-07 DIAGNOSIS — E1122 Type 2 diabetes mellitus with diabetic chronic kidney disease: Secondary | ICD-10-CM | POA: Diagnosis present

## 2023-02-07 DIAGNOSIS — J181 Lobar pneumonia, unspecified organism: Secondary | ICD-10-CM | POA: Diagnosis present

## 2023-02-07 DIAGNOSIS — N1831 Chronic kidney disease, stage 3a: Secondary | ICD-10-CM | POA: Diagnosis present

## 2023-02-07 DIAGNOSIS — I129 Hypertensive chronic kidney disease with stage 1 through stage 4 chronic kidney disease, or unspecified chronic kidney disease: Secondary | ICD-10-CM | POA: Diagnosis present

## 2023-02-07 DIAGNOSIS — E1149 Type 2 diabetes mellitus with other diabetic neurological complication: Secondary | ICD-10-CM | POA: Diagnosis not present

## 2023-02-07 DIAGNOSIS — G629 Polyneuropathy, unspecified: Secondary | ICD-10-CM

## 2023-02-07 DIAGNOSIS — R410 Disorientation, unspecified: Secondary | ICD-10-CM

## 2023-02-07 DIAGNOSIS — Z9189 Other specified personal risk factors, not elsewhere classified: Secondary | ICD-10-CM

## 2023-02-07 DIAGNOSIS — M6281 Muscle weakness (generalized): Secondary | ICD-10-CM | POA: Diagnosis not present

## 2023-02-07 DIAGNOSIS — Z79899 Other long term (current) drug therapy: Secondary | ICD-10-CM | POA: Diagnosis not present

## 2023-02-07 DIAGNOSIS — Z7401 Bed confinement status: Secondary | ICD-10-CM | POA: Diagnosis not present

## 2023-02-07 DIAGNOSIS — R531 Weakness: Secondary | ICD-10-CM | POA: Diagnosis not present

## 2023-02-07 DIAGNOSIS — Z96653 Presence of artificial knee joint, bilateral: Secondary | ICD-10-CM | POA: Diagnosis present

## 2023-02-07 DIAGNOSIS — E785 Hyperlipidemia, unspecified: Secondary | ICD-10-CM | POA: Diagnosis present

## 2023-02-07 DIAGNOSIS — R000 Tachycardia, unspecified: Secondary | ICD-10-CM | POA: Diagnosis not present

## 2023-02-07 LAB — DIFFERENTIAL
Abs Immature Granulocytes: 0.04 10*3/uL (ref 0.00–0.07)
Basophils Absolute: 0.1 10*3/uL (ref 0.0–0.1)
Basophils Relative: 1 %
Eosinophils Absolute: 0.1 10*3/uL (ref 0.0–0.5)
Eosinophils Relative: 1 %
Immature Granulocytes: 0 %
Lymphocytes Relative: 12 %
Lymphs Abs: 1.1 10*3/uL (ref 0.7–4.0)
Monocytes Absolute: 0.7 10*3/uL (ref 0.1–1.0)
Monocytes Relative: 8 %
Neutro Abs: 7.2 10*3/uL (ref 1.7–7.7)
Neutrophils Relative %: 78 %

## 2023-02-07 LAB — URINALYSIS, ROUTINE W REFLEX MICROSCOPIC
Bilirubin Urine: NEGATIVE
Glucose, UA: >=500 mg/dL — AB
Ketones, ur: NEGATIVE mg/dL
Leukocytes,Ua: NEGATIVE
Nitrite: NEGATIVE
Protein, ur: NEGATIVE mg/dL
Specific Gravity, Urine: 1.03 (ref 1.005–1.030)
Squamous Epithelial / HPF: NONE SEEN /HPF (ref 0–5)
pH: 5 (ref 5.0–8.0)

## 2023-02-07 LAB — CBC
HCT: 48.6 % (ref 39.0–52.0)
Hemoglobin: 15.9 g/dL (ref 13.0–17.0)
MCH: 29.8 pg (ref 26.0–34.0)
MCHC: 32.7 g/dL (ref 30.0–36.0)
MCV: 91.2 fL (ref 80.0–100.0)
Platelets: 309 10*3/uL (ref 150–400)
RBC: 5.33 MIL/uL (ref 4.22–5.81)
RDW: 14.4 % (ref 11.5–15.5)
WBC: 9.2 10*3/uL (ref 4.0–10.5)
nRBC: 0 % (ref 0.0–0.2)

## 2023-02-07 LAB — ETHANOL: Alcohol, Ethyl (B): <10 mg/dL (ref <10)

## 2023-02-07 LAB — GLUCOSE, CAPILLARY: Glucose-Capillary: 148 mg/dL — ABNORMAL HIGH (ref 70–99)

## 2023-02-07 LAB — PROTIME-INR
INR: 1 (ref 0.8–1.2)
Prothrombin Time: 13.2 seconds (ref 11.4–15.2)

## 2023-02-07 LAB — COMPREHENSIVE METABOLIC PANEL
ALT: 32 U/L (ref 0–44)
AST: 75 U/L — ABNORMAL HIGH (ref 15–41)
Albumin: 4.6 g/dL (ref 3.5–5.0)
Alkaline Phosphatase: 67 U/L (ref 38–126)
Anion gap: 14 (ref 5–15)
BUN: 38 mg/dL — ABNORMAL HIGH (ref 8–23)
CO2: 21 mmol/L — ABNORMAL LOW (ref 22–32)
Calcium: 9.8 mg/dL (ref 8.9–10.3)
Chloride: 101 mmol/L (ref 98–111)
Creatinine, Ser: 1.68 mg/dL — ABNORMAL HIGH (ref 0.61–1.24)
GFR, Estimated: 40 mL/min — ABNORMAL LOW (ref >60)
Glucose, Bld: 273 mg/dL — ABNORMAL HIGH (ref 70–99)
Potassium: 4.2 mmol/L (ref 3.5–5.1)
Sodium: 136 mmol/L (ref 135–145)
Total Bilirubin: 1.2 mg/dL (ref 0.3–1.2)
Total Protein: 7.9 g/dL (ref 6.5–8.1)

## 2023-02-07 LAB — APTT: aPTT: 25 seconds (ref 24–36)

## 2023-02-07 MED ORDER — ALBUTEROL SULFATE (2.5 MG/3ML) 0.083% IN NEBU: 2.5000 mg | INHALATION_SOLUTION | Freq: Four times a day (QID) | RESPIRATORY_TRACT | Status: AC | PRN

## 2023-02-07 MED ORDER — INSULIN ASPART 100 UNIT/ML IJ SOLN
0.0000 [IU] | Freq: Every day | INTRAMUSCULAR | Status: DC
  Administered 2023-02-11: 2 [IU] via SUBCUTANEOUS
  Administered 2023-02-12: 3 [IU] via SUBCUTANEOUS
  Filled 2023-02-07: qty 1

## 2023-02-07 MED ORDER — ACETAMINOPHEN 325 MG PO TABS: 650.0000 mg | ORAL_TABLET | Freq: Four times a day (QID) | ORAL | Status: AC | PRN

## 2023-02-07 MED ORDER — SODIUM CHLORIDE 0.9 % IV SOLN
500.0000 mg | Freq: Once | INTRAVENOUS | Status: AC
  Administered 2023-02-07: 500 mg via INTRAVENOUS
  Filled 2023-02-07: qty 5

## 2023-02-07 MED ORDER — ONDANSETRON HCL 4 MG/2ML IJ SOLN: 4.0000 mg | Freq: Four times a day (QID) | INTRAMUSCULAR | Status: AC | PRN

## 2023-02-07 MED ORDER — PREGABALIN 25 MG PO CAPS
25.0000 mg | ORAL_CAPSULE | Freq: Two times a day (BID) | ORAL | Status: DC
  Administered 2023-02-07 – 2023-02-13 (×11): 25 mg via ORAL
  Filled 2023-02-07 (×12): qty 1

## 2023-02-07 MED ORDER — ONDANSETRON HCL 4 MG PO TABS: 4.0000 mg | ORAL_TABLET | Freq: Four times a day (QID) | ORAL | Status: AC | PRN

## 2023-02-07 MED ORDER — SODIUM CHLORIDE 0.9 % IV SOLN
1.0000 g | INTRAVENOUS | Status: DC
  Administered 2023-02-08 – 2023-02-10 (×3): 1 g via INTRAVENOUS
  Filled 2023-02-07 (×3): qty 10

## 2023-02-07 MED ORDER — METHYLPHENIDATE HCL 10 MG PO TABS: 5.0000 mg | ORAL_TABLET | Freq: Two times a day (BID) | ORAL | Status: DC

## 2023-02-07 MED ORDER — ESCITALOPRAM OXALATE 10 MG PO TABS
40.0000 mg | ORAL_TABLET | Freq: Every morning | ORAL | Status: DC
  Administered 2023-02-09 – 2023-02-13 (×5): 40 mg via ORAL
  Filled 2023-02-07 (×6): qty 4

## 2023-02-07 MED ORDER — GABAPENTIN 300 MG PO CAPS
400.0000 mg | ORAL_CAPSULE | Freq: Every day | ORAL | Status: AC
  Administered 2023-02-07: 400 mg via ORAL
  Filled 2023-02-07: qty 1

## 2023-02-07 MED ORDER — SODIUM CHLORIDE 0.9 % IV SOLN
1.0000 g | Freq: Once | INTRAVENOUS | Status: AC
  Administered 2023-02-07: 1 g via INTRAVENOUS
  Filled 2023-02-07: qty 10

## 2023-02-07 MED ORDER — LEVOTHYROXINE SODIUM 75 MCG PO TABS
75.0000 ug | ORAL_TABLET | Freq: Every day | ORAL | Status: DC
  Administered 2023-02-08 – 2023-02-13 (×6): 75 ug via ORAL
  Filled 2023-02-07 (×2): qty 1
  Filled 2023-02-07 (×4): qty 3
  Filled 2023-02-07 (×4): qty 1
  Filled 2023-02-07: qty 3

## 2023-02-07 MED ORDER — METHYLPHENIDATE HCL 10 MG PO TABS
5.0000 mg | ORAL_TABLET | Freq: Every day | ORAL | Status: DC
  Administered 2023-02-08 – 2023-02-12 (×5): 5 mg via ORAL
  Filled 2023-02-07 (×5): qty 1

## 2023-02-07 MED ORDER — HYDROCERIN EX CREA: TOPICAL_CREAM | Freq: Two times a day (BID) | CUTANEOUS | Status: AC | PRN

## 2023-02-07 MED ORDER — VITAMIN B-12 1000 MCG PO TABS
1000.0000 ug | ORAL_TABLET | Freq: Every day | ORAL | Status: DC
  Administered 2023-02-09 – 2023-02-13 (×5): 1000 ug via ORAL
  Filled 2023-02-07 (×6): qty 1

## 2023-02-07 MED ORDER — OLANZAPINE 5 MG PO TABS
5.0000 mg | ORAL_TABLET | Freq: Every day | ORAL | Status: DC
  Administered 2023-02-08 – 2023-02-12 (×5): 5 mg via ORAL
  Filled 2023-02-07 (×5): qty 1

## 2023-02-07 MED ORDER — ACETAMINOPHEN 650 MG RE SUPP: 650.0000 mg | Freq: Four times a day (QID) | RECTAL | Status: AC | PRN

## 2023-02-07 MED ORDER — HEPARIN SODIUM (PORCINE) 5000 UNIT/ML IJ SOLN
5000.0000 [IU] | Freq: Three times a day (TID) | INTRAMUSCULAR | Status: DC
  Administered 2023-02-07 – 2023-02-13 (×17): 5000 [IU] via SUBCUTANEOUS
  Filled 2023-02-07 (×16): qty 1

## 2023-02-07 MED ORDER — SODIUM CHLORIDE 0.9 % IV SOLN
500.0000 mg | INTRAVENOUS | Status: DC
  Administered 2023-02-08 – 2023-02-10 (×2): 500 mg via INTRAVENOUS
  Filled 2023-02-07 (×3): qty 5

## 2023-02-07 MED ORDER — ATORVASTATIN CALCIUM 20 MG PO TABS
20.0000 mg | ORAL_TABLET | Freq: Every day | ORAL | Status: DC
  Administered 2023-02-07 – 2023-02-12 (×6): 20 mg via ORAL
  Filled 2023-02-07 (×6): qty 1

## 2023-02-07 MED ORDER — INSULIN ASPART 100 UNIT/ML IJ SOLN
0.0000 [IU] | Freq: Three times a day (TID) | INTRAMUSCULAR | Status: DC
  Administered 2023-02-08: 3 [IU] via SUBCUTANEOUS
  Administered 2023-02-08 – 2023-02-09 (×3): 2 [IU] via SUBCUTANEOUS
  Administered 2023-02-09: 9 [IU] via SUBCUTANEOUS
  Administered 2023-02-10: 3 [IU] via SUBCUTANEOUS
  Administered 2023-02-10: 7 [IU] via SUBCUTANEOUS
  Administered 2023-02-10: 5 [IU] via SUBCUTANEOUS
  Administered 2023-02-11 (×2): 1 [IU] via SUBCUTANEOUS
  Administered 2023-02-11 – 2023-02-12 (×3): 3 [IU] via SUBCUTANEOUS
  Administered 2023-02-12 – 2023-02-13 (×2): 1 [IU] via SUBCUTANEOUS
  Filled 2023-02-07 (×15): qty 1

## 2023-02-07 MED ORDER — SODIUM CHLORIDE 0.9% FLUSH
3.0000 mL | Freq: Once | INTRAVENOUS | Status: AC
  Administered 2023-02-07: 3 mL via INTRAVENOUS

## 2023-02-07 NOTE — ED Triage Notes (Signed)
Arrives via [facility] from home.  Patient c/o AMS x several days and possible UTI. VS wnl.

## 2023-02-07 NOTE — Assessment & Plan Note (Signed)
Escitalopram 40 mg daily resumed

## 2023-02-07 NOTE — Telephone Encounter (Signed)
Called Randy Mcdaniel and she has called 911 to come evaluate him.

## 2023-02-07 NOTE — Assessment & Plan Note (Signed)
Pt takes Lyrica 25 mg BID, gabapentin 400 mg QHS - these are continued, but should be held if pt is not awake and alert.

## 2023-02-07 NOTE — Assessment & Plan Note (Addendum)
Insulin SSI with agents coverage ordered Home metformin, glipizide not resumed on admission Goal inpatient blood glucose level is 140-180

## 2023-02-07 NOTE — ED Triage Notes (Signed)
Wife states patient not acting normal x 3 days.  Not eating or drinking to normal. Sleeping most of the day.  NOt taking medicines as he should.  Patient unable to tell wife what he needs.  Awake and alert to person.  Unable to verbalize place and time.  Able to verbalize an object, pen.  MAE equally and strong.

## 2023-02-07 NOTE — Hospital Course (Signed)
[name] is a 82 year old male with hyperlipidemia, hypertension, hypothyroid, who presents to the emergency department for chief concerns of altered mental status.  Vitals in the ED showed temperature of 98, respiration rate of 18, heart rate of 83, blood pressure 121/73, SpO2 94% on room air.  Serum sodium is 136, potassium 4.2, chloride 101, bicarb 21, BUN of 38, serum creatinine 1.68, EGFR 40, nonfasting blood glucose 273, WBC 9.2, hemoglobin 15.9, platelets of 309.  Portable chest x-ray: Left basilar patchy opacity, may represent atelectasis, aspiration, pneumonia  CT head wo contrast: Was read as no acute intracranial abnormality  ED treatment: Azithromycin 500 mg IV one-time dose, ceftriaxone 1 g IV one-time dose.

## 2023-02-07 NOTE — Assessment & Plan Note (Signed)
Home Lyrica 25 mg twice daily resumed Patient takes gabapentin 900 to 1200 mg nightly and given that patient is presenting for altered mental status, I have resumed gabapentin at 400 mg nightly for the first night on admission

## 2023-02-07 NOTE — Assessment & Plan Note (Signed)
-  Continue Lipitor °

## 2023-02-07 NOTE — ED Notes (Signed)
Pts O2 sats @90 % on RA, placed on 2L [place] and O2 improved to 95%. MD notified

## 2023-02-07 NOTE — Assessment & Plan Note (Addendum)
Most likely due to pneumonia. CT head was non-acute. 6/13, 6/14: mentation at baseline 6/15: pt lethargic today. No focal deficits on neuro exam.  Suspect hospital delirium 6/16, 17: mentation at baseline --Mgmt of pneumonia as outlined, see CAP --Delirium, fall & aspiration precautions --Hold / avoid sedating meds if lethargic

## 2023-02-07 NOTE — Assessment & Plan Note (Addendum)
Treated with IV Rocephin, Zithromax Transition to PO Ceftin, Zithromax to complete 7 day course. Scheduled Mucinex BID Albuterol nebs PRN IS & Flutter valve SLP evaluated swallow & cleared for regular diet, thin liquids. No signs of aspiration on evaluation. O2 per protocol -- pt on 2-3 L/min, but no charted O2 sats below 90%.  No clear diagnosis of acute respiratory failure at this time.

## 2023-02-07 NOTE — ED Provider Notes (Signed)
[facility] Provider Note    Event Date/Time   First MD Initiated Contact with Patient 02/07/23 1501     (approximate)   History   Chief Complaint Altered Mental Status  HPI  Randy Mcdaniel. is a 81 y.o. male with past medical history of hypertension, hyperlipidemia, hypothyroidism, CKD, anemia, and dementia who presents to the ED for altered mental status.  Wife at bedside states that patient has been increasingly confused and unsteady on his feet over the past 3 days.  She states that he has been "talking out of his head."  Patient currently states that he feels fine, reports a cough but denies any chest pain or shortness of breath.  He has not had any fevers, abdominal pain, nausea, vomiting, diarrhea, or dysuria.  Wife is concerned that he could have a UTI as he has had similar presentation in the past.      Physical Exam   Triage Vital Signs: ED Triage Vitals  Enc Vitals Group     BP 02/07/23 1233 102/80     Pulse Rate 02/07/23 1233 (!) 53     Resp 02/07/23 1233 16     Temp 02/07/23 1233 98.4 F (36.9 C)     Temp Source 02/07/23 1233 Oral     SpO2 02/07/23 1233 92 %     Weight 02/07/23 1233 200 lb (90.7 kg)     Height 02/07/23 1233 5\' 8"  (1.727 m)     Head Circumference --      Peak Flow --      Pain Score 02/07/23 1244 0     Pain Loc --      Pain Edu? --      Excl. in GC? --     Most recent vital signs: Vitals:   02/07/23 1641 02/07/23 1831  BP: 121/73   Pulse: 83   Resp: 18   Temp: 98 F (36.7 C)   SpO2: 94% 95%    Constitutional: Alert and oriented to person and place, but not time or situation. Eyes: Conjunctivae are normal. Head: Atraumatic. Nose: No congestion/rhinnorhea. Mouth/Throat: Mucous membranes are moist.  Cardiovascular: Normal rate, regular rhythm. Grossly normal heart sounds.  2+ radial pulses bilaterally. Respiratory: Normal respiratory effort.  No retractions. Lungs CTAB. Gastrointestinal: Soft and  nontender. No distention. Musculoskeletal: No lower extremity tenderness nor edema.  Neurologic:  Normal speech and language. No gross focal neurologic deficits are appreciated.    ED Results / Procedures / Treatments   Labs (all labs ordered are listed, but only abnormal results are displayed) Labs Reviewed  COMPREHENSIVE METABOLIC PANEL - Abnormal; Notable for the following components:      Result Value   CO2 21 (*)    Glucose, Bld 273 (*)    BUN 38 (*)    Creatinine, Ser 1.68 (*)    AST 75 (*)    GFR, Estimated 40 (*)    All other components within normal limits  URINALYSIS, ROUTINE W REFLEX MICROSCOPIC - Abnormal; Notable for the following components:   Color, Urine YELLOW (*)    APPearance CLEAR (*)    Glucose, UA >=500 (*)    Hgb urine dipstick SMALL (*)    Bacteria, UA RARE (*)    All other components within normal limits  PROTIME-INR  APTT  CBC  DIFFERENTIAL  ETHANOL  BASIC METABOLIC PANEL  CBC  I-STAT CREATININE, ED  CBG MONITORING, ED     EKG  ED ECG REPORT  Harriet Masson, the attending physician, personally viewed and interpreted this ECG.   Date: 02/07/2023  EKG Time: 13:17  Rate: 103  Rhythm: sinus tachycardia  Axis: Normal  Intervals:left bundle branch block  ST&T Change: None  RADIOLOGY CT head reviewed and interpreted by me with no hemorrhage or midline shift.  PROCEDURES:  Critical Care performed: No  Procedures   MEDICATIONS ORDERED IN ED: Medications  cefTRIAXone (ROCEPHIN) 1 g in sodium chloride 0.9 % 100 mL IVPB (has no administration in time range)  azithromycin (ZITHROMAX) 500 mg in sodium chloride 0.9 % 250 mL IVPB (has no administration in time range)  atorvastatin (LIPITOR) tablet 20 mg (has no administration in time range)  escitalopram (LEXAPRO) tablet 40 mg (has no administration in time range)  methylphenidate (RITALIN) tablet 5 mg (has no administration in time range)  OLANZapine (ZYPREXA) tablet 5 mg (has no  administration in time range)  levothyroxine (SYNTHROID) tablet 75 mcg (has no administration in time range)  cyanocobalamin (VITAMIN B12) tablet 1,000 mcg (has no administration in time range)  pregabalin (LYRICA) capsule 25 mg (has no administration in time range)  acetaminophen (TYLENOL) tablet 650 mg (has no administration in time range)    Or  acetaminophen (TYLENOL) suppository 650 mg (has no administration in time range)  ondansetron (ZOFRAN) tablet 4 mg (has no administration in time range)    Or  ondansetron (ZOFRAN) injection 4 mg (has no administration in time range)  heparin injection 5,000 Units (has no administration in time range)  azithromycin (ZITHROMAX) 500 mg in sodium chloride 0.9 % 250 mL IVPB (has no administration in time range)  cefTRIAXone (ROCEPHIN) 1 g in sodium chloride 0.9 % 100 mL IVPB (has no administration in time range)  albuterol (PROVENTIL) (2.5 MG/3ML) 0.083% nebulizer solution 2.5 mg (has no administration in time range)  sodium chloride flush (NS) 0.9 % injection 3 mL (3 mLs Intravenous Given 02/07/23 1656)     IMPRESSION / MDM / ASSESSMENT AND PLAN / ED COURSE  I reviewed the triage vital signs and the nursing notes.                              83 y.o. male with past medical history of hypertension, hyperlipidemia, hypothyroidism, CKD, anemia, and dementia who presents to the ED for increasing altered mental status over the past 3 days.  Patient's presentation is most consistent with acute presentation with potential threat to life or bodily function.  Differential diagnosis includes, but is not limited to, stroke, TIA, electrolyte abnormality, AKI, UTI, pneumonia.  Patient nontoxic-appearing and in no acute distress, vital signs are unremarkable.  He is awake and alert, but disoriented with no focal neurologic deficits.  CT head is negative for acute process and I have low suspicion for stroke.  We will further assess for infectious process with  chest x-ray and urinalysis.  Labs remarkable for mild AKI but no acute electrolyte abnormality, anemia, or leukocytosis noted.  LFTs are unremarkable.  Urinalysis does not appear concerning for infection, but chest x-ray does appear concerning for pneumonia.  We will start patient on Rocephin and azithromycin, case discussed with hospitalist for admission.      FINAL CLINICAL IMPRESSION(S) / ED DIAGNOSES   Final diagnoses:  Altered mental status, unspecified altered mental status type  Pneumonia due to infectious organism, unspecified laterality, unspecified part of lung     Rx / DC Orders   ED Discharge  Orders     None        Note:  This document was prepared using Dragon voice recognition software and may include unintentional dictation errors.   Chesley Noon, MD 02/07/23 1904

## 2023-02-07 NOTE — Assessment & Plan Note (Addendum)
Continue levothyroxine 

## 2023-02-07 NOTE — H&P (Signed)
History and Physical   Ilan Kehres. ZOX:[id number] DOB: 05-26-1941 DOA: 02/07/2023  PCP: Karie Schwalbe, MD  Patient coming from: home via EMS  I have personally briefly reviewed patient's old medical records in [facility] EMR.  Chief Concern: altered mental status  HPI: [name] is a 82 year old male with hyperlipidemia, hypertension, hypothyroid, who presents to the emergency department for chief concerns of altered mental status.  Vitals in the ED showed temperature of 98, respiration rate of 18, heart rate of 83, blood pressure 121/73, SpO2 94% on room air.  Serum sodium is 136, potassium 4.2, chloride 101, bicarb 21, BUN of 38, serum creatinine 1.68, EGFR 40, nonfasting blood glucose 273, WBC 9.2, hemoglobin 15.9, platelets of 309.  Portable chest x-ray: Left basilar patchy opacity, may represent atelectasis, aspiration, pneumonia  CT head wo contrast: Was read as no acute intracranial abnormality  ED treatment: Azithromycin 500 mg IV one-time dose, ceftriaxone 1 g IV one-time dose. ------------------------ At bedside, patient is able to tell me his name, age, current month, and current location. He did not know the year.   He reports he has been coughing with increase shortness of breath for 3-5 days. He is not sure.   Social history: He lives at home with his wife. He denies tobacco, etoh, recreational drug use. He is retired and formerly taught high school.  ROS: Constitutional: no weight change, no fever ENT/Mouth: no sore throat, no rhinorrhea Eyes: no eye pain, no vision changes Cardiovascular: no chest pain, no dyspnea,  no edema, no palpitations Respiratory: no cough, no sputum, no wheezing Gastrointestinal: no nausea, no vomiting, no diarrhea, no constipation Genitourinary: no urinary incontinence, no dysuria, no hematuria Musculoskeletal: no arthralgias, no myalgias Skin: no skin lesions, no pruritus, Neuro: + weakness, no loss of  consciousness, no syncope Psych: no anxiety, no depression, + decrease appetite Heme/Lymph: no bruising, no bleeding  ED Course: Discussed with emergency medicine provider, patient requiring hospitalization for chief concerns of community-acquired pneumonia.  Assessment/Plan  Principal Problem:   Altered mental status Active Problems:   Hypothyroidism   Type 2 diabetes mellitus with neurological manifestations, controlled (HCC)   Hyperlipemia   Depression, major, recurrent, in partial remission (HCC)   Chronic constipation   Benign enlargement of prostate   Stage 3a chronic kidney disease (HCC)   CAP (community acquired pneumonia)   Neuropathy   At risk for polypharmacy   Assessment and Plan:  * Altered mental status Presumed secondary to CAP Treat per pneumonia Aspiration precaution, fall precaution  At risk for polypharmacy Lyrica 25 mg p.o. twice daily resumed and gabapentin 400 mg nightly ordered on admission  Neuropathy Home Lyrica 25 mg twice daily resumed Patient takes gabapentin 900 to 1200 mg nightly and given that patient is presenting for altered mental status, I have resumed gabapentin at 400 mg nightly for the first night on admission  CAP (community acquired pneumonia) Azithromycin 500 mg IV ordered for 02/08/2023 and ceftriaxone 1 g IV daily, to continue for 5-day course Incentive spirometry, flutter valve Albuterol nebulizer q6h prn shortness of breath  Depression, major, recurrent, in partial remission (HCC) Escitalopram 40 mg daily resumed  Hyperlipemia Atorvastatin 20 mg nightly resumed  Type 2 diabetes mellitus with neurological manifestations, controlled (HCC) Insulin SSI with agents coverage ordered Home metformin, glipizide not resumed on admission Goal inpatient blood glucose level is 140-180  Hypothyroidism Levothyroxine 75 mcg daily resumed  Chart reviewed.   DVT prophylaxis: heparin 5000 units  q8h Code Status: full code Diet: heart  healthy/carb modified Family Communication: a phone call was offered, patient declined stating his wife already knows he is in the hospital Disposition Plan: pending clinical course Consults called: none at this time Admission status: telemetry medical, inpatient  Past Medical History:  Diagnosis Date   Anemia    Anxiety    Dementia (HCC)    Depression    recurrent and severe   Diabetes mellitus    Type II with nephropathy   GI bleed    HLD (hyperlipidemia)    Hypertension    Hypothyroidism    Internal hemorrhoid    Osteoarthritis    Personal history of colonic adenoma 12/03/2012   Past Surgical History:  Procedure Laterality Date   COLONOSCOPY     COLONOSCOPY WITH PROPOFOL N/A 12/20/2016   Procedure: COLONOSCOPY WITH PROPOFOL;  Surgeon: Wyline Mood, MD;  Location: [facility] ENDOSCOPY;  Service: Endoscopy;  Laterality: N/A;   ESOPHAGOGASTRODUODENOSCOPY (EGD) WITH PROPOFOL N/A 12/17/2016   Procedure: ESOPHAGOGASTRODUODENOSCOPY (EGD) WITH PROPOFOL;  Surgeon: Charlott Rakes, MD;  Location: [facility] ENDOSCOPY;  Service: Endoscopy;  Laterality: N/A;   FLEXIBLE SIGMOIDOSCOPY N/A 12/17/2016   Procedure: FLEXIBLE SIGMOIDOSCOPY;  Surgeon: Charlott Rakes, MD;  Location: [facility] ENDOSCOPY;  Service: Endoscopy;  Laterality: N/A;   HEMORRHOID BANDING  04/2017   HEMORRHOID SURGERY N/A 06/14/2017   Procedure: HEMORRHOIDECTOMY;  Surgeon: Kieth Brightly, MD;  Location: [facility] ORS;  Service: General;  Laterality: N/A;   HERNIA REPAIR     JOINT REPLACEMENT Bilateral    PROCTOSCOPY N/A 06/14/2017   Procedure: PROCTOSCOPY;  Surgeon: Kieth Brightly, MD;  Location: [facility] ORS;  Service: General;  Laterality: N/A;   TOTAL KNEE ARTHROPLASTY  11/09   Right-Dr. Remer Macho ([facility])   TOTAL KNEE ARTHROPLASTY  6/10   Left-Dr. Remer Macho ([facility])   UMBILICAL HERNIA REPAIR  2000   Social History:  reports that he has never smoked. He has been exposed to tobacco smoke. He quit smokeless tobacco use about 20 years ago.   His smokeless tobacco use included chew. He reports that he does not drink alcohol and does not use drugs.  Allergies  Allergen Reactions   Vytorin [Ezetimibe-Simvastatin] Nausea Only    patient nor wife does not remember   Family History  Problem Relation Age of Onset   Heart failure Father    Diabetes Father    Depression Father    Coronary artery disease Father    Breast cancer Sister 46   Lung cancer Sister    Depression Sister    Stomach cancer Mother 63   Stroke Other        Paternal side   Parkinsonism Other        Cousin   Colon cancer Neg Hx    Rectal cancer Neg Hx    Esophageal cancer Neg Hx    Colon polyps Neg Hx    Family history: Family history reviewed and not pertinent.  Prior to Admission medications   Medication Sig Start Date End Date Taking? Authorizing Provider  acetaminophen (TYLENOL) 650 MG CR tablet Take 650 mg by mouth every 8 (eight) hours as needed for pain.    Yes [provider]  atorvastatin (LIPITOR) 20 MG tablet TAKE ONE TABLET BY MOUTH ONCE DAILY 01/30/23  Yes Tillman Abide I, MD  escitalopram (LEXAPRO) 20 MG tablet TAKE 2 TABLETS BY MOUTH EVERY MORNING 10/18/22  Yes Karie Schwalbe, MD  glipiZIDE (GLUCOTROL) 5 MG tablet TAKE 1 TABLET BY MOUTH TWICE (  2) DAILY BEFORE A MEAL 10/18/22  Yes Karie Schwalbe, MD  levothyroxine (SYNTHROID) 75 MCG tablet TAKE ONE TABLET BY MOUTH ONCE DAILY 01/16/23  Yes Tillman Abide I, MD  metFORMIN (GLUCOPHAGE) 500 MG tablet TAKE 1 TABLET BY MOUTH 3 TIMES DAILY 05/30/22  Yes Karie Schwalbe, MD  methylphenidate (RITALIN) 5 MG tablet TAKE 1 TABLET BY MOUTH TWICE A DAY 01/02/23  Yes Karie Schwalbe, MD  OLANZapine (ZYPREXA) 5 MG tablet TAKE ONE TABLET BY MOUTH AT BEDTIME 01/02/23  Yes Karie Schwalbe, MD  pregabalin (LYRICA) 25 MG capsule TAKE ONE CAPSULE BY MOUTH TWICE DAILY 11/28/22  Yes Karie Schwalbe, MD  vitamin B-12 (CYANOCOBALAMIN) 1000 MCG tablet Take 1,000 mcg by mouth daily.   Yes [provider]  empagliflozin (JARDIANCE) 25 MG TABS tablet TAKE 1 TABLET BY MOUTH ONCE A DAY BEFOREBREAKFAST 08/30/22   Tillman Abide I, MD  gabapentin (NEURONTIN) 300 MG capsule TAKE 3 TO 4 CAPSULES [phone] MG TOTAL)BY MOUTH AT BEDTIME 06/13/22   Karie Schwalbe, MD  linaclotide [facility]) 145 MCG CAPS capsule Take 1 capsule (145 mcg total) by mouth every other day. Patient not taking: Reported on 02/07/2023 06/14/22   Karie Schwalbe, MD  tamsulosin (FLOMAX) 0.4 MG CAPS capsule TAKE ONE CAPSULE BY MOUTH DAILY Patient not taking: Reported on 02/07/2023 12/26/22   Karie Schwalbe, MD   Physical Exam: Vitals:   02/07/23 1233 02/07/23 1641 02/07/23 1831  BP: 102/80 121/73   Pulse: (!) 53 83   Resp: 16 18   Temp: 98.4 F (36.9 C) 98 F (36.7 C)   TempSrc: Oral Oral   SpO2: 92% 94% 95%  Weight: 90.7 kg    Height: 5\' 8"  (1.727 m)     Constitutional: appears age appropriate, frail, NAD, calm Eyes: PERRL, lids and conjunctivae normal ENMT: Mucous membranes are moist. Posterior pharynx clear of any exudate or lesions. Age-appropriate dentition. Hearing appropriate Neck: normal, supple, no masses, no thyromegaly Respiratory: clear to auscultation bilaterally, no wheezing, no crackles. Normal respiratory effort. No accessory muscle use.  Cardiovascular: Regular rate and rhythm, no murmurs / rubs / gallops. No extremity edema. 2+ pedal pulses. No carotid bruits.  Abdomen: obese abdomen, no tenderness, no masses palpated, no hepatosplenomegaly. Bowel sounds positive.  Musculoskeletal: no clubbing / cyanosis. No joint deformity upper and lower extremities. Good ROM, no contractures, no atrophy. Normal muscle tone.  Skin: no rashes, lesions, ulcers. No induration. Chronic skin changes in media Neurologic: Sensation intact. Strength 5/5 in all 4.  Psychiatric: Normal judgment and insight. Alert and oriented x 3. Normal mood.   EKG: independently reviewed, showing sinus tachycardia with rate  of 103, first-degree AV block, QTc 542  Chest x-ray on Admission: I personally reviewed and I agree with radiologist reading as below.  DG Chest 2 View  Result Date: 02/07/2023 CLINICAL DATA:  Altered mental status and cough EXAM: CHEST - 2 VIEW COMPARISON:  None Available. FINDINGS: Elevation of the right hemidiaphragm with interposition of colon. Normal left lung volume. Left basilar patchy opacities. No pleural effusion or pneumothorax. The heart size and mediastinal contours are within normal limits. Degenerative changes of the bilateral glenohumeral joints. IMPRESSION: Left basilar patchy opacities, which may represent atelectasis, aspiration, or pneumonia. Electronically Signed   By: Agustin Cree M.D.   On: 02/07/2023 18:03   CT HEAD WO CONTRAST  Result Date: 02/07/2023 CLINICAL DATA:  Altered mental status, nontraumatic (Ped 0-17y) EXAM: CT HEAD WITHOUT CONTRAST TECHNIQUE: Contiguous  axial images were obtained from the base of the skull through the vertex without intravenous contrast. RADIATION DOSE REDUCTION: This exam was performed according to the departmental dose-optimization program which includes automated exposure control, adjustment of the mA and/or kV according to patient size and/or use of iterative reconstruction technique. COMPARISON:  None Available. FINDINGS: Brain: No evidence of acute infarction, hemorrhage, hydrocephalus, extra-axial collection or mass lesion/mass effect. There is sequela of moderate chronic microvascular ischemic change with generalized volume loss. Vascular: No hyperdense vessel or unexpected calcification. Skull: Normal. Negative for fracture or focal lesion. Sinuses/Orbits: No middle ear or mastoid effusion. Paranasal sinuses are clear. Orbits are unremarkable. Other: None. IMPRESSION: No acute intracranial abnormality. Electronically Signed   By: Lorenza Cambridge M.D.   On: 02/07/2023 13:13    Labs on Admission: I have personally reviewed following  labs  CBC: Recent Labs  Lab 02/07/23 1243  WBC 9.2  NEUTROABS 7.2  HGB 15.9  HCT 48.6  MCV 91.2  PLT 309   Basic Metabolic Panel: Recent Labs  Lab 02/07/23 1243  NA 136  K 4.2  CL 101  CO2 21*  GLUCOSE 273*  BUN 38*  CREATININE 1.68*  CALCIUM 9.8   GFR: Estimated Creatinine Clearance: 36.4 mL/min (A) (by C-G formula based on SCr of 1.68 mg/dL (H)).  Liver Function Tests: Recent Labs  Lab 02/07/23 1243  AST 75*  ALT 32  ALKPHOS 67  BILITOT 1.2  PROT 7.9  ALBUMIN 4.6   Coagulation Profile: Recent Labs  Lab 02/07/23 1243  INR 1.0   Urine analysis:    Component Value Date/Time   COLORURINE YELLOW (A) 02/07/2023 1654   APPEARANCEUR CLEAR (A) 02/07/2023 1654   LABSPEC 1.030 02/07/2023 1654   PHURINE 5.0 02/07/2023 1654   GLUCOSEU >=500 (A) 02/07/2023 1654   GLUCOSEU NEGATIVE 06/06/2017 1256   HGBUR SMALL (A) 02/07/2023 1654   BILIRUBINUR NEGATIVE 02/07/2023 1654   BILIRUBINUR small (A) 10/29/2019 1557   KETONESUR NEGATIVE 02/07/2023 1654   PROTEINUR NEGATIVE 02/07/2023 1654   UROBILINOGEN 1.0 10/29/2019 1557   UROBILINOGEN 1.0 06/06/2017 1256   NITRITE NEGATIVE 02/07/2023 1654   LEUKOCYTESUR NEGATIVE 02/07/2023 1654   This document was prepared using Dragon Voice Recognition software and may include unintentional dictation errors.  Dr. Sedalia Muta [facility]  If 7PM-7AM, please contact overnight-coverage provider If 7AM-7PM, please contact day attending provider [email or web address]  02/07/2023, 8:35 PM

## 2023-02-08 DIAGNOSIS — N1831 Chronic kidney disease, stage 3a: Secondary | ICD-10-CM

## 2023-02-08 DIAGNOSIS — E1149 Type 2 diabetes mellitus with other diabetic neurological complication: Secondary | ICD-10-CM

## 2023-02-08 DIAGNOSIS — J189 Pneumonia, unspecified organism: Principal | ICD-10-CM

## 2023-02-08 DIAGNOSIS — R410 Disorientation, unspecified: Secondary | ICD-10-CM | POA: Diagnosis not present

## 2023-02-08 LAB — CBC
HCT: 43.7 % (ref 39.0–52.0)
Hemoglobin: 14 g/dL (ref 13.0–17.0)
MCH: 30 pg (ref 26.0–34.0)
MCHC: 32 g/dL (ref 30.0–36.0)
MCV: 93.6 fL (ref 80.0–100.0)
Platelets: 248 10*3/uL (ref 150–400)
RBC: 4.67 MIL/uL (ref 4.22–5.81)
RDW: 14.5 % (ref 11.5–15.5)
WBC: 8.3 10*3/uL (ref 4.0–10.5)
nRBC: 0 % (ref 0.0–0.2)

## 2023-02-08 LAB — BASIC METABOLIC PANEL
Anion gap: 8 (ref 5–15)
BUN: 36 mg/dL — ABNORMAL HIGH (ref 8–23)
CO2: 26 mmol/L (ref 22–32)
Calcium: 9 mg/dL (ref 8.9–10.3)
Chloride: 105 mmol/L (ref 98–111)
Creatinine, Ser: 1.47 mg/dL — ABNORMAL HIGH (ref 0.61–1.24)
GFR, Estimated: 47 mL/min — ABNORMAL LOW (ref >60)
Glucose, Bld: 115 mg/dL — ABNORMAL HIGH (ref 70–99)
Potassium: 3.6 mmol/L (ref 3.5–5.1)
Sodium: 139 mmol/L (ref 135–145)

## 2023-02-08 LAB — GLUCOSE, CAPILLARY
Glucose-Capillary: 118 mg/dL — ABNORMAL HIGH (ref 70–99)
Glucose-Capillary: 113 mg/dL — ABNORMAL HIGH (ref 70–99)
Glucose-Capillary: 226 mg/dL — ABNORMAL HIGH (ref 70–99)
Glucose-Capillary: 184 mg/dL — ABNORMAL HIGH (ref 70–99)
Glucose-Capillary: 135 mg/dL — ABNORMAL HIGH (ref 70–99)

## 2023-02-08 MED ORDER — SENNOSIDES-DOCUSATE SODIUM 8.6-50 MG PO TABS
1.0000 | ORAL_TABLET | Freq: Two times a day (BID) | ORAL | Status: DC
  Administered 2023-02-08 – 2023-02-13 (×11): 1 via ORAL
  Filled 2023-02-08 (×11): qty 1

## 2023-02-08 MED ORDER — GABAPENTIN 300 MG PO CAPS
400.0000 mg | ORAL_CAPSULE | Freq: Every day | ORAL | Status: AC
  Administered 2023-02-08: 400 mg via ORAL
  Filled 2023-02-08: qty 1

## 2023-02-08 MED ORDER — ENSURE ENLIVE PO LIQD: 237.0000 mL | Freq: Two times a day (BID) | ORAL | Status: DC

## 2023-02-08 MED ORDER — ENSURE ENLIVE PO LIQD
237.0000 mL | Freq: Three times a day (TID) | ORAL | Status: DC
  Administered 2023-02-08 – 2023-02-13 (×7): 237 mL via ORAL

## 2023-02-08 MED ORDER — GUAIFENESIN ER 600 MG PO TB12
600.0000 mg | ORAL_TABLET | Freq: Two times a day (BID) | ORAL | Status: DC
  Administered 2023-02-08 – 2023-02-13 (×10): 600 mg via ORAL
  Filled 2023-02-08 (×11): qty 1

## 2023-02-08 MED ORDER — ADULT MULTIVITAMIN W/MINERALS CH
1.0000 | ORAL_TABLET | Freq: Every day | ORAL | Status: DC
  Administered 2023-02-08 – 2023-02-13 (×6): 1 via ORAL
  Filled 2023-02-08 (×6): qty 1

## 2023-02-08 MED ORDER — POLYETHYLENE GLYCOL 3350 17 G PO PACK
17.0000 g | PACK | Freq: Every day | ORAL | Status: DC
  Administered 2023-02-08 – 2023-02-13 (×6): 17 g via ORAL
  Filled 2023-02-08 (×6): qty 1

## 2023-02-08 NOTE — Progress Notes (Addendum)
Progress Note   Patient: Randy Mcdaniel. GNF:[id number] DOB: 06-22-1941 DOA: 02/07/2023     1 DOS: the patient was seen and examined on 02/08/2023   Brief hospital course: Randy Mcdaniel is a 83 year old male with hyperlipidemia, hypertension, hypothyroid, who presented on 02/07/2023 for evaluation of altered mental status, fatigue and increased shortness of breath for 305 days.  Vitals in the ED were normal.  Labs were notable for glucose 273, BUN 38, Cr 1.68.  CBC was normal including WBC of 9.2k.  Chest xray was concerning for pneumonia, showing left basilar patchy opacity.  CT head was non-acute.   Pt was admitted to the hospital and started on empiric IV antibiotics for community-acquired versus aspiration pneumonia.  Assessment and Plan: * Altered mental status Most likely due to pneumonia. CT head was non-acute. --Mgmt of pneumonia as outlined, see CAP --Delirium, fall & aspiration precautions  CAP (community acquired pneumonia) Continue IV Rocephin, Zithromax Scheduled Mucinex BID Albuterol nebs PRN IS & Flutter valve SLP for swallow eval given report of signs of aspiration at home. O2 per protocol -- pt on 2-3 L/min, but no charted O2 sats below 90%.  No clear diagnosis of acute respiratory failure at this time.  At risk for polypharmacy Pt takes Lyrica 25 mg BID, gabapentin 400 mg QHS - these are continued, but should be held if pt is not awake and alert.  Neuropathy Continue home Lyrica 25 mg BID Pt also takes gabapentin [phone] mg nightly, but given AMS, was given reduced dose 400 mg night of admission.  Continue 400 mg gabapentin QHS.  --Hold both if pt has altered mental status / lethargy / not awake or alert   Stage 3a chronic kidney disease (HCC) AKI - Cr 1.68 on admission, up from recent baseline Cr of 1.18-1.35.  Due to pre-renal azotemia - wife reports 3-4 days poor PO intake at home. 6/12: Cr improved to 1.47 --Monitor BMP --Hold IV fluids &  encourage PO hydration  Benign enlargement of prostate No longer takes Flomax. Monitor for urinary retention PRN.  Chronic constipation No longer takes Linzess. Scheduled Miralax and Senna-S ordered.  Depression, major, recurrent, in partial remission (HCC) Continue home Lexapro  Hyperlipemia Continue Lipitor  Type 2 diabetes mellitus with neurological manifestations, controlled (HCC) Hold home metformin, glipizide. Sliding scale Novolog Titrate insulin for inpatient goal 140-180  Hypothyroidism Continue levothyroxine         Subjective: Pt awake sitting up in bed, wife at bedside this AM.  He reports feeling okay today. Wife reports he is very weak, was not able to sit up without significant assistance earlier.  He denies cough, but he and wife agree he's had some shortness of breath. Wife reports he's not been out of bed for 3-4 days, not eating and drinking much.  Wife endorses some occasional choking episodes with swallowing.   Physical Exam: Vitals:   02/08/23 0130 02/08/23 0618 02/08/23 0820 02/08/23 1139  BP: 101/65 113/83 (!) 125/56 117/70  Pulse: 79 64 63 64  Resp: 20 16 20  (!) 21  Temp: 98 F (36.7 C) (!) 97.4 F (36.3 C) 98.2 F (36.8 C) 97.7 F (36.5 C)  TempSrc: Oral Oral    SpO2: 95% 100% 98% 96%  Weight:      Height:       General exam: awake, appears drowsy, no acute distress HEENT: atraumatic, clear conjunctiva, anicteric sclera, moist mucus membranes, hearing grossly normal  Respiratory system: CTAB with diminished bases,  on 3 L/min [place] O2, no wheezes, mildly increased respiratory effort at rest with accessory muscle use. Cardiovascular system: normal S1/S2, RRR, no JVD, murmurs, rubs, gallops, no pedal edema.   Gastrointestinal system: soft, NT, ND, no HSM felt, +bowel sounds. Central nervous system: A&O x 2+. no gross focal neurologic deficits, normal speech Extremities: moves all, no edema, normal tone Skin: dry, intact, normal  temperature Psychiatry: normal mood, flat affect   Data Reviewed:  Notable labs --  glucose 115, BUN 38 >> 36, Cr 1.68 >> 1.47.  Normal CBC.   Family Communication: wife at bedside  Disposition: Status is: Inpatient Remains inpatient appropriate because: remains on IV antibiotics pending further clinical improvement and weaning off oxygen   Planned Discharge Destination: Home    Time spent: 42 minutes  Author: Pennie Banter, DO 02/08/2023 3:06 PM  For on call review [email or web address].

## 2023-02-08 NOTE — Assessment & Plan Note (Signed)
No longer takes Linzess. Scheduled Miralax and Senna-S ordered.

## 2023-02-08 NOTE — Assessment & Plan Note (Addendum)
AKI - Cr 1.68 on admission, up from recent baseline Cr of 1.18-1.35.  Due to pre-renal azotemia - wife reports 3-4 days poor PO intake at home. Cr improved 1.47 >> 1.35 >> 1.25 At baseline. --Monitor BMP --Renally dose meds & avoid nephrotoxins

## 2023-02-08 NOTE — Assessment & Plan Note (Addendum)
No longer takes Flomax. Monitor for urinary retention PRN. 6/17 -- pt unable to void this AM --Flomax resumed --Bladder scan PRN to check for retention --In/out cath in retaining > 400 cc  --Follow up with Urology if persistent despite Flomax

## 2023-02-08 NOTE — Progress Notes (Signed)
Mobility Specialist - Progress Note   02/08/23 1434  Mobility  Activity Stood at bedside;Dangled on edge of bed;Ambulated with assistance in room;Transferred from bed to chair  Level of Assistance +2 (takes two people)  Location manager Ambulated (ft) 2 ft  Activity Response Tolerated well  Mobility Referral Yes  $Mobility charge 1 Mobility  Mobility Specialist Start Time (ACUTE ONLY) [id number]  Mobility Specialist Stop Time (ACUTE ONLY) 0233  Mobility Specialist Time Calculation (min) (ACUTE ONLY) 10 min   Pt semi-supine in bed on RA upon arrival. Pt completes bed mobility MinA. Pt STS with ModA +2 and side steps from bed to recliner CGA +2 assist. Pt left in recliner with needs in reach and chair alarm activated.   Terrilyn Saver  Mobility Specialist  02/08/23 2:37 PM

## 2023-02-08 NOTE — Progress Notes (Signed)
Initial Nutrition Assessment  DOCUMENTATION CODES:   Not applicable  INTERVENTION:   -Liberalize diet to regular -MVI with minerals daily -Ensure Enlive po TID, each supplement provides 350 kcal and 20 grams of protein  NUTRITION DIAGNOSIS:   Inadequate oral intake related to lethargy/confusion as evidenced by meal completion < 25%, per patient/family report.  GOAL:   Patient will meet greater than or equal to 90% of their needs  MONITOR:   PO intake, Supplement acceptance  REASON FOR ASSESSMENT:   Malnutrition Screening Tool    ASSESSMENT:   Pt with hyperlipidemia, hypertension, hypothyroid, who presents for chief concerns of altered mental status.  Pt admitted with AMS secondary to CAP.   Reviewed I/O's: +355 ml x 24 hours  UOP: 375 ml x 24 hours  Pt very lethargic at time of visit and did not respond to voice or touch.   History obtained from pt wife at bedside. Per wife, pt has experienced a general decline in health over the past few months; he has been less active ("he mainly sits in the chair") and has had a decreased appetite. At baseline, pt is very active and plays tennis, but has been unable to do this for several months. Over the past 5 days, pt has mostly slept and ate only bites and sips- wife is concerned as pt is disinterested in food, even if she cooks his favorite meal. Pt is drinking well and is offered water and propel frequently.   Pt was so lethargic this morning that he was unable to take PO medications.   Pt wife agreeable to try supplements when he is awake enough to take PO's.   Reviewed wt hx; pt has experienced a 4.9% wt loss over the past 2 months, which is not significant for time frame. Pt wife suspects he has lost weight over the past month or so, but unsure how much.    Medications reviewed and include vitamin B-12, lexapro, and ritalin.   Lab Results  Component Value Date   HGBA1C 8.2 (A) 12/14/2022   PTA DM medications are 5 mg  glipizide BID, 500 mg metformin TID, and 25 mg jardiance daily.   Labs reviewed: CBGS: 113-118 (inpatient orders for glycemic control are 0-5 units insulin aspart daily at bedtime and 0-9 units insulin aspart TID with meals).    NUTRITION - FOCUSED PHYSICAL EXAM:  Flowsheet Row Most Recent Value  Orbital Region No depletion  Upper Arm Region Mild depletion  Thoracic and Lumbar Region No depletion  Buccal Region No depletion  Temple Region Mild depletion  Clavicle Bone Region No depletion  Clavicle and Acromion Bone Region No depletion  Scapular Bone Region No depletion  Dorsal Hand Moderate depletion  Patellar Region Moderate depletion  Anterior Thigh Region Moderate depletion  Posterior Calf Region Moderate depletion  Edema (RD Assessment) None  Hair Reviewed  Eyes Reviewed  Mouth Reviewed  Skin Reviewed  Nails Reviewed       Diet Order:   Diet Order             Diet regular Fluid consistency: Thin  Diet effective now                   EDUCATION NEEDS:   Education needs have been addressed  Skin:  Skin Assessment: Reviewed RN Assessment  Last BM:  Unknown  Height:   Ht Readings from Last 1 Encounters:  02/07/23 5\' 8"  (1.727 m)    Weight:   Wt Readings from  Last 1 Encounters:  02/07/23 86.3 kg    Ideal Body Weight:  70 kg  BMI:  Body mass index is 28.93 kg/m.  Estimated Nutritional Needs:   Kcal:  1700-1900  Protein:  90-105 grams  Fluid:  > 1.7 L    Levada Schilling, RD, LDN, CDCES Registered Dietitian II Certified Diabetes Care and Education Specialist Please refer to [facility] for RD and/or RD on-call/weekend/after hours pager

## 2023-02-09 ENCOUNTER — Encounter: Payer: Self-pay | Admitting: Internal Medicine

## 2023-02-09 DIAGNOSIS — J189 Pneumonia, unspecified organism: Secondary | ICD-10-CM | POA: Diagnosis not present

## 2023-02-09 DIAGNOSIS — K5909 Other constipation: Secondary | ICD-10-CM | POA: Diagnosis not present

## 2023-02-09 DIAGNOSIS — E1149 Type 2 diabetes mellitus with other diabetic neurological complication: Secondary | ICD-10-CM | POA: Diagnosis not present

## 2023-02-09 DIAGNOSIS — N1831 Chronic kidney disease, stage 3a: Secondary | ICD-10-CM | POA: Diagnosis not present

## 2023-02-09 LAB — GLUCOSE, CAPILLARY
Glucose-Capillary: 363 mg/dL — ABNORMAL HIGH (ref 70–99)
Glucose-Capillary: 159 mg/dL — ABNORMAL HIGH (ref 70–99)
Glucose-Capillary: 191 mg/dL — ABNORMAL HIGH (ref 70–99)
Glucose-Capillary: 138 mg/dL — ABNORMAL HIGH (ref 70–99)

## 2023-02-09 LAB — BASIC METABOLIC PANEL
Anion gap: 7 (ref 5–15)
BUN: 35 mg/dL — ABNORMAL HIGH (ref 8–23)
CO2: 26 mmol/L (ref 22–32)
Calcium: 9.1 mg/dL (ref 8.9–10.3)
Chloride: 104 mmol/L (ref 98–111)
Creatinine, Ser: 1.35 mg/dL — ABNORMAL HIGH (ref 0.61–1.24)
GFR, Estimated: 52 mL/min — ABNORMAL LOW (ref >60)
Glucose, Bld: 138 mg/dL — ABNORMAL HIGH (ref 70–99)
Potassium: 4 mmol/L (ref 3.5–5.1)
Sodium: 137 mmol/L (ref 135–145)

## 2023-02-09 LAB — MAGNESIUM: Magnesium: 2.2 mg/dL (ref 1.7–2.4)

## 2023-02-09 NOTE — Progress Notes (Signed)
Physical Therapy Treatment Patient Details Name: Randy Mcdaniel. MRN: [id number] DOB: 22-Jun-1941 Today's Date: 02/09/2023   History of Present Illness Mr. Yasir Kitner is a 82 year old male with hyperlipidemia, hypertension, hypothyroid, who presents to the emergency department for chief concerns of altered mental status.    PT Comments    Patient long sitting in bed with wife in the room upon arrival. Discussed with wife PLOF, home set up and support. Wife was previously handling grooming/bathing, dressing and IADLs. She is home with him 24/7 but states she cannot assist him with mobility. Patient was aware of person and place but could not state their birthday or reason for being in the hospital. Demonstrated increased lethargy/fatigue during the session. Patient was on 2L of O2 at 94% spo2. Patient was removed from oxygen for the session to test his response. Bed mobility was performed with min assist with rolling,mod assist +2 for sidelying to sitting. Spo2 after bed mobility dropped to 83 on RA, encouraged patient to perform pursed lip breathing. After a minute of rest Spo2 returned to 92% on RA. Patient was mod assist for 2 for sitting balance EOB, demonstrated decreased core strength and control. Max assist for stand pivot transfer to recliner. Patient was left in recliner, feet propped with call bell inr each and alarm set. Wife and nursing in room at end of session, communicated with nursing O2 stats and patients need for linen change. Pt will continue to receive skilled PT services while admitted and will defer to TOC/care team for updates regarding disposition planning.    Recommendations for follow up therapy are one component of a multi-disciplinary discharge planning process, led by the attending physician.  Recommendations may be updated based on patient status, additional functional criteria and insurance authorization.  Follow Up Recommendations  Can patient physically be  transported by private vehicle: No    Assistance Recommended at Discharge Frequent or constant Supervision/Assistance  Patient can return home with the following Two people to help with walking and/or transfers;A lot of help with bathing/dressing/bathroom;Assistance with cooking/housework;Direct supervision/assist for medications management;Direct supervision/assist for financial management;Assist for transportation;Help with stairs or ramp for entrance   Equipment Recommendations  None recommended by PT    Recommendations for Other Services       Precautions / Restrictions Precautions Precautions: Fall Restrictions Weight Bearing Restrictions: No     Mobility  Bed Mobility Overal bed mobility: Modified Independent Bed Mobility: Rolling, Sidelying to Sit Rolling: Min assist Sidelying to sit: +2 for physical assistance, Mod assist       General bed mobility comments: Patient sues bedrails to roll to the side with min assist at hip from SPT. + 2 mod assist for sidelyiing to sit. SPT needed to move b/b LE.    Transfers   Equipment used: None Transfers: Sit to/from Stand, Bed to chair/wheelchair/BSC Sit to Stand: Max assist Stand pivot transfers: Max assist         General transfer comment: Patient stood with SPT max assist for stand pivot transfer. patient able to reach back for chair arm when sitting.    Ambulation/Gait               General Gait Details: unable to assess at this time.   Stairs             Wheelchair Mobility    Modified Rankin (Stroke Patients Only)       Balance Overall balance assessment: Needs assistance Sitting-balance support: Feet supported, Bilateral  upper extremity supported Sitting balance-Leahy Scale: Poor Sitting balance - Comments: requires intermittent mod assist to maintain sitting balance.   Standing balance support: No upper extremity supported, During functional activity Standing balance-Leahy Scale:  Poor Standing balance comment: Stood with max assist +1 for stand pivot transfer. Cueing needed to encourage patient to participate in standing.                            Cognition Arousal/Alertness: Lethargic Behavior During Therapy: Flat affect, WFL for tasks assessed/performed Overall Cognitive Status: Impaired/Different from baseline Area of Impairment: Orientation, Attention, Memory, Awareness, Problem solving                 Orientation Level: Disoriented to, Time, Situation Current Attention Level: Alternating Memory: Decreased short-term memory     Awareness: Intellectual, Emergent Problem Solving: Slow processing, Decreased initiation, Difficulty sequencing, Requires verbal cues General Comments: Patient is aware of person and place. Unaware of time and reason for admission. Extremely lethargic through the session. Works with encouragement and cueing. Needs constant intervention to maintain attention to task.        Exercises      General Comments        Pertinent Vitals/Pain Pain Assessment Pain Assessment: No/denies pain    Home Living Family/patient expects to be discharged to:: Private residence Living Arrangements: Spouse/significant other Available Help at Discharge: Family Type of Home: Apartment Home Access: Level entry       Home Layout: One level Home Equipment: Agricultural consultant (2 wheels);Grab bars - tub/shower Additional Comments: Wife at home 24/7 but states she is unable to assist with mobility.    Prior Function            PT Goals (current goals can now be found in the care plan section) Acute Rehab PT Goals Patient Stated Goal: unable to state at this time PT Goal Formulation: With patient/family Time For Goal Achievement: 02/23/23 Potential to Achieve Goals: Fair    Frequency    Min 2X/week      PT Plan      Co-evaluation              AM-PAC PT "6 Clicks" Mobility   Outcome Measure  Help needed  turning from your back to your side while in a flat bed without using bedrails?: A Lot Help needed moving from lying on your back to sitting on the side of a flat bed without using bedrails?: A Lot Help needed moving to and from a bed to a chair (including a wheelchair)?: Total Help needed standing up from a chair using your arms (e.g., wheelchair or bedside chair)?: Total Help needed to walk in hospital room?: Total Help needed climbing 3-5 steps with a railing? : Total 6 Click Score: 8    End of Session Equipment Utilized During Treatment: Oxygen;Gait belt Activity Tolerance: Patient limited by fatigue Patient left: in chair;with call bell/phone within reach;with chair alarm set;with family/visitor present Nurse Communication: Mobility status;Other (comment) (O2 status) PT Visit Diagnosis: Unsteadiness on feet (R26.81);Other abnormalities of gait and mobility (R26.89);Muscle weakness (generalized) (M62.81);Difficulty in walking, not elsewhere classified (R26.2)     Time: 4098-1191 PT Time Calculation (min) (ACUTE ONLY): 23 min  Charges:                        Malachi Carl, SPT   Malachi Carl 02/09/2023, 12:46 PM

## 2023-02-09 NOTE — Progress Notes (Addendum)
Progress Note   Patient: Randy Mcdaniel. ZOX:[id number] DOB: 02-07-1941 DOA: 02/07/2023     2 DOS: the patient was seen and examined on 02/09/2023   Brief hospital course: Randy Mcdaniel is a 82 year old male with hyperlipidemia, hypertension, hypothyroid, who presented on 02/07/2023 for evaluation of altered mental status, fatigue and increased shortness of breath for 305 days.  Vitals in the ED were normal.  Labs were notable for glucose 273, BUN 38, Cr 1.68.  CBC was normal including WBC of 9.2k.  Chest xray was concerning for pneumonia, showing left basilar patchy opacity.  CT head was non-acute.   Pt was admitted to the hospital and started on empiric IV antibiotics for community-acquired versus aspiration pneumonia.  Assessment and Plan: * Acute metabolic encephalopathy Most likely due to pneumonia. CT head was non-acute. --Mgmt of pneumonia as outlined, see CAP --Delirium, fall & aspiration precautions  CAP (community acquired pneumonia) Continue IV Rocephin, Zithromax Scheduled Mucinex BID Albuterol nebs PRN IS & Flutter valve SLP for swallow eval given report of signs of aspiration at home. O2 per protocol -- pt on 2-3 L/min, but no charted O2 sats below 90%.  No clear diagnosis of acute respiratory failure at this time.  At risk for polypharmacy Pt takes Lyrica 25 mg BID, gabapentin 400 mg QHS - these are continued, but should be held if pt is not awake and alert.  Neuropathy Continue home Lyrica 25 mg BID Pt also takes gabapentin [phone] nightly, but given AMS, was given reduced dose 400 mg night of admission.  Continue 400 mg gabapentin QHS.  --Hold both if pt has altered mental status / lethargy / not awake or alert   Stage 3a chronic kidney disease (HCC) AKI - Cr 1.68 on admission, up from recent baseline Cr of 1.18-1.35.  Due to pre-renal azotemia - wife reports 3-4 days poor PO intake at home. 6/12: Cr improved to 1.47 6/13: Cr 1.35  improving --Monitor BMP --Hold IV fluids & encourage PO hydration  Benign enlargement of prostate No longer takes Flomax. Monitor for urinary retention PRN.  Chronic constipation No longer takes Linzess. Scheduled Miralax and Senna-S ordered.  Depression, major, recurrent, in partial remission (HCC) Continue home Lexapro, Olanzapine at bedtime  Hyperlipemia Continue Lipitor  Type 2 diabetes mellitus with neurological manifestations, controlled (HCC) Hold home metformin, glipizide. Sliding scale Novolog Titrate insulin for inpatient goal 140-180  Hypothyroidism Continue levothyroxine         Subjective: Pt was sleeping soundly when seen this AM.  Wife was not present at the time.  Pt woke briefly to voice, denied acute complaints.  Reports feeling very tired.  No other acute complaints or events reported.    Physical Exam: Vitals:   02/09/23 0751 02/09/23 1155 02/09/23 1201 02/09/23 1616  BP: 125/72   124/75  Pulse: 63   65  Resp: 18   18  Temp: 97.7 F (36.5 C)   98.7 F (37.1 C)  TempSrc:      SpO2: 92% 94% 92% 94%  Weight:      Height:       General exam: somnolent, wakes briefly, no acute distress HEENT: moist mucus membranes, hearing grossly normal  Respiratory system: diminished bases, on 2 L/min [place] O2, no wheezes, normal respiratory effort at rest. Cardiovascular system: normal S1/S2, RRR, no JVD, murmurs, rubs, gallops, no pedal edema.   Gastrointestinal system: soft, NT, ND Central nervous system: A&O x 2+. no gross focal neurologic deficits,  normal speech Extremities: moves all, no edema, normal tone Skin: dry, intact, normal temperature Psychiatry: normal mood, flat affect   Data Reviewed:  Notable labs --  glucose 138, BUN 35, Cr 1.68 >> 1.47 >> 1.35.   Family Communication: wife at bedside 6/12.  Not present on rounds today, will attempt to call.  Disposition: Status is: Inpatient Remains inpatient appropriate because: remains on IV  antibiotics pending further clinical improvement and weaning off oxygen   Planned Discharge Destination: Home    Time spent: 36 minutes  Author: Pennie Banter, DO 02/09/2023 6:36 PM  For on call review [email or web address].

## 2023-02-09 NOTE — Evaluation (Signed)
Clinical/Bedside Swallow Evaluation Patient Details  Name: Randy Mcdaniel. MRN: [id number] Date of Birth: 28-Oct-1940  Today's Date: 02/09/2023 Time: SLP Start Time (ACUTE ONLY): 0955 SLP Stop Time (ACUTE ONLY): 1010 SLP Time Calculation (min) (ACUTE ONLY): 15 min  Past Medical History:  Past Medical History:  Diagnosis Date   Anemia    Anxiety    Dementia (HCC)    Depression    recurrent and severe   Diabetes mellitus    Type II with nephropathy   GI bleed    HLD (hyperlipidemia)    Hypertension    Hypothyroidism    Internal hemorrhoid    Osteoarthritis    Personal history of colonic adenoma 12/03/2012   Past Surgical History:  Past Surgical History:  Procedure Laterality Date   COLONOSCOPY     COLONOSCOPY WITH PROPOFOL N/A 12/20/2016   Procedure: COLONOSCOPY WITH PROPOFOL;  Surgeon: Wyline Mood, MD;  Location: [facility] ENDOSCOPY;  Service: Endoscopy;  Laterality: N/A;   ESOPHAGOGASTRODUODENOSCOPY (EGD) WITH PROPOFOL N/A 12/17/2016   Procedure: ESOPHAGOGASTRODUODENOSCOPY (EGD) WITH PROPOFOL;  Surgeon: Charlott Rakes, MD;  Location: [facility] ENDOSCOPY;  Service: Endoscopy;  Laterality: N/A;   FLEXIBLE SIGMOIDOSCOPY N/A 12/17/2016   Procedure: FLEXIBLE SIGMOIDOSCOPY;  Surgeon: Charlott Rakes, MD;  Location: [facility] ENDOSCOPY;  Service: Endoscopy;  Laterality: N/A;   HEMORRHOID BANDING  04/2017   HEMORRHOID SURGERY N/A 06/14/2017   Procedure: HEMORRHOIDECTOMY;  Surgeon: Kieth Brightly, MD;  Location: [facility] ORS;  Service: General;  Laterality: N/A;   HERNIA REPAIR     JOINT REPLACEMENT Bilateral    PROCTOSCOPY N/A 06/14/2017   Procedure: PROCTOSCOPY;  Surgeon: Kieth Brightly, MD;  Location: [facility] ORS;  Service: General;  Laterality: N/A;   TOTAL KNEE ARTHROPLASTY  11/09   Right-Dr. Remer Macho ([facility])   TOTAL KNEE ARTHROPLASTY  6/10   Left-Dr. Remer Macho ([facility])   UMBILICAL HERNIA REPAIR  2000   HPI:  Per H&P: [name] is a 81 year old male with hyperlipidemia,  hypertension, hypothyroid, who presents to the emergency department for chief concerns of altered mental status. Portable chest x-ray: Left basilar patchy opacity, may represent atelectasis, aspiration, pneumonia. CT head wo contrast: Was read as no acute intracranial abnormality. Further, "Randy Mcdaniel endorses some occasional choking episodes with swallowing." Pt lethargic, on 3L nasal canula, and agreeable to ST evaluation.    Assessment / Plan / Recommendation  Clinical Impression  Pt seen for bedside swallow assessment in the setting of AMS and CAP. Pt seen with trials of regular solids and thin liquids (with limited solids completed secondary to increasing lethargy during evaluation). No overt or subtle s/sx pharyngeal dysphagia noted. No change to vocal quality across trials. Oral phase impacted lack of upper dentition. Pt reported inconsistent use of upper dentures in home setting- no present in hospital. Overall, mastication notably prolonged with min oral residue at completion. Lethargy increasing during trials leading to increased risk for aspiration/choking. Pt denied choking at home- spouse not present for verification.   Given inconsistent alertness and current pulmonary/respiratory status- pt is at increased risk for aspiration. Recommend continued regular solids (cut meats, extra sauce/gravies) and STRICT aspiration precautions (slow rate, small bites, elevated HOB, and alert for PO intake). Intermittent supervision to ensure compliance with aspiration precautions. Medications whole in thin liquids as able. SLP to follow up for education/aspiration precaution training. Physician and RN made aware of recommendations.  SLP Visit Diagnosis: Dysphagia, unspecified (R13.10)    Aspiration Risk  Moderate aspiration risk    Diet Recommendation  Regular solids (cut meats), thin liquids  Medication Administration: Whole meds with liquid    Other  Recommendations Oral Care Recommendations: Oral care  BID    Recommendations for follow up therapy are one component of a multi-disciplinary discharge planning process, led by the attending physician.  Recommendations may be updated based on patient status, additional functional criteria and insurance authorization.  Follow up Recommendations No SLP follow up (no follow up expected)      Assistance Recommended at Discharge  Follow PT/OT recommendations   Functional Status Assessment Patient has had a recent decline in their functional status and demonstrates the ability to make significant improvements in function in a reasonable and predictable amount of time.  Frequency and Duration min 2x/week  1 week       Prognosis Prognosis for improved oropharyngeal function: Good Barriers to Reach Goals:  (lethargy)      Swallow Study   General Date of Onset: 02/09/23 HPI: Per H&P: [name] is a 82 year old male with hyperlipidemia, hypertension, hypothyroid, who presents to the emergency department for chief concerns of altered mental status. Portable chest x-ray: Left basilar patchy opacity, may represent atelectasis, aspiration, pneumonia. CT head wo contrast: Was read as no acute intracranial abnormality. Further, "Randy Mcdaniel endorses some occasional choking episodes with swallowing." Pt lethargic, on 3L nasal canula, and agreeable to ST evaluation. Type of Study: Bedside Swallow Evaluation Previous Swallow Assessment: none in chart Diet Prior to this Study: Regular;Thin liquids (Level 0) Temperature Spikes Noted: No Respiratory Status: Nasal cannula (3L) History of Recent Intubation: No Behavior/Cognition: Lethargic/Drowsy;Distractible Oral Cavity Assessment: Within Functional Limits Oral Care Completed by SLP: Recent completion by staff Oral Cavity - Dentition: Missing dentition (dentures uppers- not present for eval) Vision: Functional for self-feeding Self-Feeding Abilities: Able to feed self Patient Positioning: Upright in  bed Baseline Vocal Quality: Normal Volitional Cough: Strong Volitional Swallow: Unable to elicit    Oral/Motor/Sensory Function Overall Oral Motor/Sensory Function: Within functional limits   Ice Chips Ice chips: Not tested   Thin Liquid Thin Liquid: Within functional limits    Nectar Thick Nectar Thick Liquid: Not tested   Honey Thick Honey Thick Liquid: Not tested   Puree Puree: Not tested   Solid     Solid: Impaired Presentation: Self Fed Oral Phase Impairments: Impaired mastication Oral Phase Functional Implications: Oral residue Pharyngeal Phase Impairments:  (none)     [place] Quavion Boule Clapp  MS [facility] SLP   [place] J Clapp 02/09/2023,10:27 AM

## 2023-02-09 NOTE — Progress Notes (Addendum)
Occupational Therapy Evaluation Patient Details Name: Randy Mcdaniel. MRN: [id number] DOB: 07/19/1941 Today's Date: 02/09/2023   History of Present Illness [name] is a 82 year old male with hyperlipidemia, hypertension, hypothyroid, who presents to the emergency department for chief concerns of altered mental status.   Clinical Impression   Patient presenting with generalized weakness, decreased activity tolerance and functional status decline in ADL/mobility performance. Patient lives with spouse in 1 level home PTA, was IND-MOD I with ADLs, spouse assists with IADLs (pt did not drive), uses RW. Spouse not present to clarify PLOF. Pt on 3L Sp02% via [place], 95% after bed mobility. Patient currently functioning below baseline, anticipate +2 for functional transfers, requires MIN A-MAX Afor bed mobility, and MIN A for UB ADLs/MAX A for LB ADLs. Patient will benefit from acute OT to increase overall independence in the areas of ADLs and functional mobility in order to safely discharge.       Recommendations for follow up therapy are one component of a multi-disciplinary discharge planning process, led by the attending physician.  Recommendations may be updated based on patient status, additional functional criteria and insurance authorization.   Assistance Recommended at Discharge Intermittent Supervision/Assistance  Patient can return home with the following Assistance with cooking/housework;Direct supervision/assist for financial management;Direct supervision/assist for medications management;Assist for transportation;Help with stairs or ramp for entrance;Two people to help with walking and/or transfers;Two people to help with bathing/dressing/bathroom    Functional Status Assessment  Patient has had a recent decline in their functional status and demonstrates the ability to make significant improvements in function in a reasonable and predictable amount of time.  Equipment  Recommendations  Other (comment) (Defer to next level of care)    Recommendations for Other Services       Precautions / Restrictions Precautions Precautions: Fall      Mobility Bed Mobility Overal bed mobility: Needs Assistance Bed Mobility: Supine to Sit, Sit to Sidelying     Supine to sit: Max assist   Sit to sidelying: Min assist General bed mobility comments: Pt performing bed mobility with min-max A, use of bed rails and able to bridge from hooklying position 1x to scoot towars HOB. Follows directions WFL.    Transfers                   General transfer comment: Pt declines further mobility at this time, needing to use urinal.      Balance Overall balance assessment: Needs assistance Sitting-balance support: Feet supported, Bilateral upper extremity supported Sitting balance-Leahy Scale: Poor Sitting balance - Comments: Requires min A posterior support to maintain sitting EOB balance Postural control: Posterior lean                                 ADL either performed or assessed with clinical judgement   ADL Overall ADL's : Needs assistance/impaired Eating/Feeding: Sitting;Set up (Pt able to feed himself with setup)                           Toileting- Clothing Manipulation and Hygiene: Bed level;Minimal assistance         General ADL Comments: Pt with generalized weakness impacting efficient performance of ADLs. Functional transfer not performed at this time, anticipate pt would require MAX A - MOD A +2 for transfers, MIN A for UB ADLs, MAX A +1 or MOD A +2  for LB ADLs seated EOB.     Vision Baseline Vision/History: 1 Wears glasses Ability to See in Adequate Light: 0 Adequate Patient Visual Report: No change from baseline Vision Assessment?: No apparent visual deficits     Perception     Praxis      Pertinent Vitals/Pain Pain Assessment Pain Assessment: No/denies pain     Hand Dominance Left   Extremity/Trunk  Assessment Upper Extremity Assessment Upper Extremity Assessment: Generalized weakness   Lower Extremity Assessment Lower Extremity Assessment: Generalized weakness       Communication Communication Communication: No difficulties   Cognition Arousal/Alertness: Lethargic (Pt initally very drowsy, follows 1-2 step commands, A&Ox4, states he did not sleep well last night, pleasant and willing to work with OT at this time despite fatigue/sleepiness) Behavior During Therapy: WFL for tasks assessed/performed Overall Cognitive Status: No family/caregiver present to determine baseline cognitive functioning                                                  Home Living Family/patient expects to be discharged to:: Private residence Living Arrangements: Spouse/significant other Available Help at Discharge: Family Type of Home: House Home Access: Level entry     Home Layout: One level     Bathroom Shower/Tub: Producer, television/film/video: Standard Bathroom Accessibility: Yes   Home Equipment: Agricultural consultant (2 wheels);Grab bars - tub/shower          Prior Functioning/Environment Prior Level of Function : Other (comment) (PTA, pt states he was MOD I - IND with ADLs, uses RW at baseline, does not drive, wife assists with IADLs.)             Mobility Comments: Pt uses RW at baseline ADLs Comments: Pt states he did not require assist for ADLs        OT Problem List: Decreased strength;Decreased activity tolerance;Impaired balance (sitting and/or standing)      OT Treatment/Interventions: Self-care/ADL training;Therapeutic exercise;Energy conservation;DME and/or AE instruction;Therapeutic activities;Patient/family education;Balance training    OT Goals(Current goals can be found in the care plan section) Acute Rehab OT Goals Patient Stated Goal: To go home OT Goal Formulation: With patient Time For Goal Achievement: 02/23/23 Potential to Achieve  Goals: Good ADL Goals Pt Will Perform Upper Body Dressing: with min assist;sitting Pt Will Perform Lower Body Dressing: with min assist;sitting/lateral leans;with adaptive equipment Pt Will Transfer to Toilet: stand pivot transfer;with mod assist;squat pivot transfer;with min assist  OT Frequency: Min 2X/week       AM-PAC OT "6 Clicks" Daily Activity     Outcome Measure Help from another person eating meals?: A Little Help from another person taking care of personal grooming?: A Little Help from another person toileting, which includes using toliet, bedpan, or urinal?: A Lot Help from another person bathing (including washing, rinsing, drying)?: A Lot Help from another person to put on and taking off regular upper body clothing?: A Little Help from another person to put on and taking off regular lower body clothing?: A Lot 6 Click Score: 15   End of Session Nurse Communication: Mobility status;Other (comment) (NT made aware of mobility status, urine void, and position left (sitting in bed eating breakfast).)  Activity Tolerance: Patient tolerated treatment well Patient left: in bed;with call bell/phone within reach;with bed alarm set  OT Visit Diagnosis: Unsteadiness on feet (R26.81);Muscle  weakness (generalized) (M62.81);Other abnormalities of gait and mobility (R26.89)                Time: 1610-9604 OT Time Calculation (min): 29 min Charges:  OT General Charges $OT Visit: 1 Visit OT Evaluation $OT Eval Low Complexity: 1 Low OT Treatments $Self Care/Home Management : 8-22 mins Nickey Kloepfer L. [place] Acklin, OTR/L  02/09/23, 10:04 AM

## 2023-02-10 DIAGNOSIS — G9341 Metabolic encephalopathy: Secondary | ICD-10-CM

## 2023-02-10 LAB — BASIC METABOLIC PANEL
Anion gap: 9 (ref 5–15)
BUN: 30 mg/dL — ABNORMAL HIGH (ref 8–23)
CO2: 27 mmol/L (ref 22–32)
Calcium: 9.5 mg/dL (ref 8.9–10.3)
Chloride: 103 mmol/L (ref 98–111)
Creatinine, Ser: 1.25 mg/dL — ABNORMAL HIGH (ref 0.61–1.24)
GFR, Estimated: 57 mL/min — ABNORMAL LOW (ref >60)
Glucose, Bld: 148 mg/dL — ABNORMAL HIGH (ref 70–99)
Potassium: 4.1 mmol/L (ref 3.5–5.1)
Sodium: 139 mmol/L (ref 135–145)

## 2023-02-10 LAB — GLUCOSE, CAPILLARY
Glucose-Capillary: 209 mg/dL — ABNORMAL HIGH (ref 70–99)
Glucose-Capillary: 275 mg/dL — ABNORMAL HIGH (ref 70–99)
Glucose-Capillary: 323 mg/dL — ABNORMAL HIGH (ref 70–99)
Glucose-Capillary: 143 mg/dL — ABNORMAL HIGH (ref 70–99)

## 2023-02-10 MED ORDER — LACTULOSE 10 GM/15ML PO SOLN
20.0000 g | Freq: Two times a day (BID) | ORAL | Status: DC | PRN
  Administered 2023-02-10: 20 g via ORAL
  Filled 2023-02-10: qty 30

## 2023-02-10 MED ORDER — GABAPENTIN 300 MG PO CAPS
400.0000 mg | ORAL_CAPSULE | Freq: Every day | ORAL | Status: DC
  Administered 2023-02-10 – 2023-02-12 (×3): 400 mg via ORAL
  Filled 2023-02-10 (×3): qty 1

## 2023-02-10 NOTE — NC FL2 (Signed)
[place] MEDICAID FL2 LEVEL OF CARE FORM     IDENTIFICATION  Patient Name: Randy Mcdaniel. Birthdate: [birth year] Sex: male Admission Date (Current Location): 02/07/2023  [facility] and [place] Number:  Chiropodist and Address:  [facility], [address], [place], [place] [address]      Provider Number: [id number]  Attending Physician Name and Address:  Pennie Banter, DO  Relative Name and Phone Number:  Tyone Huey [phone]    Current Level of Care: Hospital Recommended Level of Care: Skilled Nursing Facility Prior Approval Number:    Date Approved/Denied:   PASRR Number: [id number]  Discharge Plan: SNF    Current Diagnoses: Patient Active Problem List   Diagnosis Date Noted   Acute metabolic encephalopathy 02/07/2023   CAP (community acquired pneumonia) 02/07/2023   Neuropathy 02/07/2023   At risk for polypharmacy 02/07/2023   Peripheral vascular disease, unspecified (HCC) 08/17/2020   Change in mental state 11/05/2019   Stage 3a chronic kidney disease (HCC) 08/08/2018   Benign enlargement of prostate 07/28/2017   Iron deficiency anemia due to chronic blood loss 05/15/2017   Advance directive discussed with patient 05/22/2015   Spinal stenosis, lumbar region, with neurogenic claudication 09/17/2014   Gait disturbance 01/23/2014   Routine general medical examination at a health care facility 10/16/2012   Chronic constipation 01/10/2011   Vitamin B12 deficiency 08/18/2010   Type 2 diabetes mellitus with neurological manifestations, controlled (HCC) 02/17/2007   OSTEOARTHRITIS 02/17/2007   Hypothyroidism 02/15/2007   Hyperlipemia 02/15/2007   Depression, major, recurrent, in partial remission (HCC) 02/15/2007    Orientation RESPIRATION BLADDER Height & Weight     Self  O2 (2-3L per [place]) Continent Weight: 86.3 kg Height:  5\' 8"  (172.7 cm)  BEHAVIORAL SYMPTOMS/MOOD NEUROLOGICAL BOWEL NUTRITION STATUS       Continent  (See Discharge Summary)  AMBULATORY STATUS COMMUNICATION OF NEEDS Skin   Extensive Assist Verbally Normal                       Personal Care Assistance Level of Assistance  Bathing, Feeding, Dressing Bathing Assistance: Maximum assistance Feeding assistance: Maximum assistance Dressing Assistance: Maximum assistance     Functional Limitations Info  Sight, Hearing, Speech Sight Info: Impaired (Wears glasses) Hearing Info: Adequate Speech Info: Adequate    SPECIAL CARE FACTORS FREQUENCY  PT (By licensed PT), OT (By licensed OT), Speech therapy     PT Frequency: 5x Weekly OT Frequency: 5x Weekly     Speech Therapy Frequency: 1x weekly      Contractures Contractures Info: Not present    Additional Factors Info  Code Status, Allergies Code Status Info: Full Code Allergies Info: Vytorin (Ezetimibe-simvastatin)           Current Medications (02/10/2023):  This is the current hospital active medication list Current Facility-Administered Medications  Medication Dose Route Frequency Provider Last Rate Last Admin   acetaminophen (TYLENOL) tablet 650 mg  650 mg Oral Q6H PRN Cox, Amy N, DO       Or   acetaminophen (TYLENOL) suppository 650 mg  650 mg Rectal Q6H PRN Cox, Amy N, DO       albuterol (PROVENTIL) (2.5 MG/3ML) 0.083% nebulizer solution 2.5 mg  2.5 mg Nebulization Q6H PRN Cox, Amy N, DO       atorvastatin (LIPITOR) tablet 20 mg  20 mg Oral QHS Cox, Amy N, DO   20 mg at 02/09/23 2222   azithromycin (ZITHROMAX)  500 mg in sodium chloride 0.9 % 250 mL IVPB  500 mg Intravenous Q24H Cox, Amy N, DO 250 mL/hr at 02/08/23 1822 500 mg at 02/08/23 1822   cefTRIAXone (ROCEPHIN) 1 g in sodium chloride 0.9 % 100 mL IVPB  1 g Intravenous Q24H Cox, Amy N, DO 200 mL/hr at 02/09/23 1731 1 g at 02/09/23 1731   cyanocobalamin (VITAMIN B12) tablet 1,000 mcg  1,000 mcg Oral Daily Cox, Amy N, DO   1,000 mcg at 02/09/23 0902   escitalopram (LEXAPRO) tablet 40 mg  40 mg Oral q  morning Cox, Amy N, DO   40 mg at 02/09/23 0902   feeding supplement (ENSURE ENLIVE / ENSURE PLUS) liquid 237 mL  237 mL Oral TID BM Esaw Grandchild A, DO   237 mL at 02/09/23 2003   guaiFENesin (MUCINEX) 12 hr tablet 600 mg  600 mg Oral BID Esaw Grandchild A, DO   600 mg at 02/09/23 2222   heparin injection 5,000 Units  5,000 Units Subcutaneous Q8H Cox, Amy N, DO   5,000 Units at 02/10/23 4098   hydrocerin (EUCERIN) cream   Topical BID PRN Cox, Amy N, DO       insulin aspart (novoLOG) injection 0-5 Units  0-5 Units Subcutaneous QHS Cox, Amy N, DO       insulin aspart (novoLOG) injection 0-9 Units  0-9 Units Subcutaneous TID WC Cox, Amy N, DO   2 Units at 02/09/23 1728   levothyroxine (SYNTHROID) tablet 75 mcg  75 mcg Oral Q0600 Cox, Amy N, DO   75 mcg at 02/10/23 0610   methylphenidate (RITALIN) tablet 5 mg  5 mg Oral Q lunch Cox, Amy N, DO   5 mg at 02/09/23 1252   multivitamin with minerals tablet 1 tablet  1 tablet Oral Daily Esaw Grandchild A, DO   1 tablet at 02/09/23 0902   OLANZapine (ZYPREXA) tablet 5 mg  5 mg Oral QHS Cox, Amy N, DO   5 mg at 02/09/23 2224   ondansetron (ZOFRAN) tablet 4 mg  4 mg Oral Q6H PRN Cox, Amy N, DO       Or   ondansetron (ZOFRAN) injection 4 mg  4 mg Intravenous Q6H PRN Cox, Amy N, DO       polyethylene glycol (MIRALAX / GLYCOLAX) packet 17 g  17 g Oral Daily Esaw Grandchild A, DO   17 g at 02/09/23 0902   pregabalin (LYRICA) capsule 25 mg  25 mg Oral BID Cox, Amy N, DO   25 mg at 02/09/23 2222   senna-docusate (Senokot-S) tablet 1 tablet  1 tablet Oral BID Esaw Grandchild A, DO   1 tablet at 02/09/23 2223     Discharge Medications: Please see discharge summary for a list of discharge medications.  Relevant Imaging Results:  Relevant Lab Results:   Additional Information [id number]  Garret Reddish, RN

## 2023-02-10 NOTE — Progress Notes (Signed)
Progress Note   Patient: Randy Mcdaniel. OZH:[id number] DOB: [birth year] DOA: 02/07/2023     3 DOS: the patient was seen and examined on 02/10/2023   Brief hospital course: Mr. Sylvia Stanberry is a 82 year old male with hyperlipidemia, hypertension, hypothyroid, who presented on 02/07/2023 for evaluation of altered mental status, fatigue and increased shortness of breath for 305 days.  Vitals in the ED were normal.  Labs were notable for glucose 273, BUN 38, Cr 1.68.  CBC was normal including WBC of 9.2k.  Chest xray was concerning for pneumonia, showing left basilar patchy opacity.  CT head was non-acute.   Pt was admitted to the hospital and started on empiric IV antibiotics for community-acquired versus aspiration pneumonia.  Assessment and Plan: * Acute metabolic encephalopathy Most likely due to pneumonia. CT head was non-acute. --Mgmt of pneumonia as outlined, see CAP --Delirium, fall & aspiration precautions  CAP (community acquired pneumonia) Continue IV Rocephin, Zithromax Scheduled Mucinex BID Albuterol nebs PRN IS & Flutter valve SLP for swallow eval given report of signs of aspiration at home. O2 per protocol -- pt on 2-3 L/min, but no charted O2 sats below 90%.  No clear diagnosis of acute respiratory failure at this time.  At risk for polypharmacy Pt takes Lyrica 25 mg BID, gabapentin 400 mg QHS - these are continued, but should be held if pt is not awake and alert.  Neuropathy Continue home Lyrica 25 mg BID Pt also takes gabapentin [phone] nightly, but given AMS, was given reduced dose 400 mg night of admission.  Continue 400 mg gabapentin QHS.  --Hold both if pt has altered mental status / lethargy / not awake or alert   Stage 3a chronic kidney disease (HCC) AKI - Cr 1.68 on admission, up from recent baseline Cr of 1.18-1.35.  Due to pre-renal azotemia - wife reports 3-4 days poor PO intake at home. 6/12: Cr improved to 1.47 6/13: Cr 1.35  improving --Monitor BMP --Hold IV fluids & encourage PO hydration  Benign enlargement of prostate No longer takes Flomax. Monitor for urinary retention PRN.  Chronic constipation No longer takes Linzess. Scheduled Miralax and Senna-S ordered.  Depression, major, recurrent, in partial remission (HCC) Continue home Lexapro, Olanzapine at bedtime  Hyperlipemia Continue Lipitor  Type 2 diabetes mellitus with neurological manifestations, controlled (HCC) Hold home metformin, glipizide. Sliding scale Novolog Titrate insulin for inpatient goal 140-180  Hypothyroidism Continue levothyroxine         Subjective: Pt was up in recliner, wife at bedside this AM.  He reports feeling much better.  Agreeable to rehab, hopes he can go today.  Wife reports having a spot for him at [facility] and asking to speak to case manager.  Pt denies any acute complaints.    Physical Exam: Vitals:   02/09/23 1616 02/09/23 2025 02/10/23 0504 02/10/23 0736  BP: 124/75 130/79 122/68 136/81  Pulse: 65 63 66 (!) 58  Resp: 18 20 12  (!) 24  Temp: 98.7 F (37.1 C) 97.9 F (36.6 C) (!) 97.5 F (36.4 C) 97.8 F (36.6 C)  TempSrc:      SpO2: 94% 93% 93% 90%  Weight:      Height:       General exam: somnolent, wakes briefly, no acute distress HEENT: moist mucus membranes, hearing grossly normal  Respiratory system: CTAB, no wheezes or rhonchi, normal respiratory effort at rest, on room air. Cardiovascular system: normal S1/S2, RRR, no pedal edema.   Gastrointestinal system:  soft, NT, ND Central nervous system: A&O x 3. no gross focal neurologic deficits, normal speech Extremities: moves all, no edema, normal tone Skin: dry, intact, normal temperature Psychiatry: normal mood, normal affect, judgment and insight appear normal   Data Reviewed:  Notable labs --  glucose 148, BUN 30, Cr 1.68 >> 1.47 >> 1.35 >> 1.25 improving.    Family Communication: wife at bedside 6/12, today 6/14.      Disposition: Status is: Inpatient Remains inpatient appropriate because: remains on IV antibiotics pending further clinical improvement and weaning off oxygen   Planned Discharge Destination: Home    Time spent: 36 minutes  Author: Pennie Banter, DO 02/10/2023 1:49 PM  For on call review [email or web address].

## 2023-02-10 NOTE — Care Management Important Message (Signed)
Important Message  Patient Details  Name: Randy Mcdaniel. MRN: [id number] Date of Birth: 06-Jan-1941   Medicare Important Message Given:  Yes     Olegario Messier A [name] 02/10/2023, 2:01 PM

## 2023-02-10 NOTE — Progress Notes (Signed)
Physical Therapy Treatment Patient Details Name: Randy Mcdaniel. MRN: [id number] DOB: 10-Feb-1941 Today's Date: 02/10/2023   History of Present Illness [name] is a 82 year old male with hyperlipidemia, hypertension, hypothyroid, who presents to the emergency department for chief concerns of altered mental status.    PT Comments    Patient sitting in chair after transferring to bsc with nursing. Aox4 to person, place, time and why. Wife sitting in room asking about discharge to SNF, referred her to the [facility] team. Patient was mod assist +2 for sit to stand on first trial. Able to demonstrate lateral weight shifts with RW. Ambulated 5 ft with mod assist +1 and chair follow. Trial 2 patient ambulated 5 ft with increased postural sway. Rest break for 1 minute in between trials. Trial 3 patient ambulated 5ft with mod assist, RW, and chair follow. Patient was motivated through out the duration of the session. Requires sequencing cues for returning to sitting at this time. Communicated with TOC that the patients wife requested to speak to them. Patient left in chair with alarm on and phone in reach. Wife returning to room upon our exit.    Recommendations for follow up therapy are one component of a multi-disciplinary discharge planning process, led by the attending physician.  Recommendations may be updated based on patient status, additional functional criteria and insurance authorization.  Follow Up Recommendations  Can patient physically be transported by private vehicle: Yes    Assistance Recommended at Discharge Intermittent Supervision/Assistance  Patient can return home with the following Help with stairs or ramp for entrance;Assist for transportation;Assistance with cooking/housework;A lot of help with bathing/dressing/bathroom;A lot of help with walking and/or transfers   Equipment Recommendations  None recommended by PT    Recommendations for Other Services        Precautions / Restrictions Precautions Precautions: Fall Restrictions Weight Bearing Restrictions: No     Mobility  Bed Mobility               General bed mobility comments: Unable to assess at this time due to patient being in recliner at start and end of session.    Transfers Overall transfer level: Needs assistance Equipment used: Rolling walker (2 wheels) Transfers: Sit to/from Stand, Bed to chair/wheelchair/BSC Sit to Stand: +2 safety/equipment, Mod assist           General transfer comment: Patient stood with mod assist +2 to RW for first standing trial. 2nd trial patient stood mod assist +1. Patient demonstrates appropriate sequecing for standing but requires verbal cueing for reaching back with hands to sit.    Ambulation/Gait Ambulation/Gait assistance: Mod assist, +2 safety/equipment Gait Distance (Feet): 25 Feet Assistive device: Rolling walker (2 wheels) Gait Pattern/deviations: Step-to pattern, Shuffle, Staggering left, Staggering right, Leaning posteriorly, Narrow base of support, Knee flexed in stance - left, Knee flexed in stance - right, Decreased step length - right, Decreased step length - left       General Gait Details: Patient able to ambulate with mod assist of 1 with w/c follow for safety. Demonstrated a step to pattern with decreased step length, shuffling, narrow BOS, leaning posteriorly, and knees flexed in stance. Assist needed for patient to maintain balance. Demonstrated appropriate stepping response to LOB.   Stairs             Wheelchair Mobility    Modified Rankin (Stroke Patients Only)       Balance Overall balance assessment: Needs assistance Sitting-balance support: Feet supported,  Bilateral upper extremity supported Sitting balance-Leahy Scale: Fair   Postural control: Posterior lean Standing balance support: Bilateral upper extremity supported, During functional activity, Reliant on assistive device for  balance Standing balance-Leahy Scale: Poor Standing balance comment: Stood mod assist with b/l UE on rolling walker. increased sway and stepping response while standing.                            Cognition Arousal/Alertness: Awake/alert Behavior During Therapy: Flat affect, WFL for tasks assessed/performed Overall Cognitive Status: Within Functional Limits for tasks assessed                                 General Comments: Patient was AOx4 today. Was able to communicate clearly, state his goals, hold a conversation and follow commands.        Exercises      General Comments        Pertinent Vitals/Pain Pain Assessment Pain Assessment: No/denies pain    Home Living                          Prior Function            PT Goals (current goals can now be found in the care plan section) Acute Rehab PT Goals Patient Stated Goal: To go home PT Goal Formulation: With patient/family Time For Goal Achievement: 02/23/23 Potential to Achieve Goals: Fair Progress towards PT goals: Progressing toward goals    Frequency    Min 2X/week      PT Plan Current plan remains appropriate    Co-evaluation              AM-PAC PT "6 Clicks" Mobility   Outcome Measure  Help needed turning from your back to your side while in a flat bed without using bedrails?: A Little Help needed moving from lying on your back to sitting on the side of a flat bed without using bedrails?: A Little Help needed moving to and from a bed to a chair (including a wheelchair)?: A Lot Help needed standing up from a chair using your arms (e.g., wheelchair or bedside chair)?: A Lot Help needed to walk in hospital room?: A Lot Help needed climbing 3-5 steps with a railing? : A Lot 6 Click Score: 14    End of Session Equipment Utilized During Treatment: Oxygen;Gait belt Activity Tolerance: Patient tolerated treatment well Patient left: in chair;with call bell/phone  within reach;with chair alarm set;with family/visitor present Nurse Communication: Mobility status PT Visit Diagnosis: Unsteadiness on feet (R26.81);Other abnormalities of gait and mobility (R26.89);Muscle weakness (generalized) (M62.81);Difficulty in walking, not elsewhere classified (R26.2)     Time: 4098-1191 PT Time Calculation (min) (ACUTE ONLY): 18 min  Charges:                        Malachi Carl, SPT    Malachi Carl 02/10/2023, 10:26 AM

## 2023-02-10 NOTE — TOC Initial Note (Signed)
Transition of Care (TOC) - Initial/Assessment Note    Patient Details  Name: Randy Mcdaniel. MRN: [id number] Date of Birth: [birth year]  Transition of Care [facility]) CM/SW Contact:    Randy Reddish, RN Phone Number: 02/10/2023, 1:13 PM  Clinical Narrative:    Chart reviewed.  Noted that patient was admitted with possible PNA v/s Aspiration PNA.  Patient is currently on IV Rocephin and IV Zithromax.  He has been also placed on albuterol nebs prn, IS and flutter valve.  Speech therapy has also been consulted to see patient.    I have spoken with patient and his wife.  Patient's wife Randy Mcdaniel informs me that prior to admission patient lived at home with her.  She reports that she would have to assist with bathing and dressing, but he was able to assist with his ADL's.  Patient was able to get around his home with a walker.  She reports that he was doing fairly well get around his home till he became sick.  Randy Mcdaniel reports that patient become extremely weak once he become sick on Tuesday and she decided to call EMS.    I have spoken with Randy Mcdaniel about SNF placement process.  Randy Mcdaniel reports that she has spoken with someone at [facility] and her SNF preference is Randy Mcdaniel.  She has given me permission to complete bed search at [facility].    I have completed Bed search at [facility].  I have asked Randy Mcdaniel with [facility] to review referral.    TOC will follow for discharge planning.               Expected Discharge Plan: Skilled Nursing Facility Barriers to Discharge: No Barriers Identified   Patient Goals and CMS Choice   CMS Medicare.gov Compare Post Acute Care list provided to:: Patient Choice offered to / list presented to : Patient, Spouse      Expected Discharge Plan and Services   Discharge Planning Services: CM Consult                                          Prior Living Arrangements/Services   Lives with:: Spouse Patient language and need for interpreter reviewed::  Yes Do you feel safe going back to the place where you live?: Yes        Care giver support system in place?: Yes (comment) (Patient has a supportive wife) Current home services:  (Patient has a 2 wheeled rolling walker at home.)    Activities of Daily Living Home Assistive Devices/Equipment: CBG Meter, Eyeglasses, Walker (specify type) ADL Screening (condition at time of admission) Patient's cognitive ability adequate to safely complete daily activities?: Yes Is the patient deaf or have difficulty hearing?: No Does the patient have difficulty seeing, even when wearing glasses/contacts?: No Does the patient have difficulty concentrating, remembering, or making decisions?: No Patient able to express need for assistance with ADLs?: No Does the patient have difficulty dressing or bathing?: No Independently performs ADLs?: Yes (appropriate for developmental age) Does the patient have difficulty walking or climbing stairs?: No Weakness of Legs: None Weakness of Arms/Hands: None  Permission Sought/Granted   Permission granted to share information with : Yes, Verbal Permission Granted              Emotional Assessment       Orientation: : Oriented to Self, Oriented to Place,  Oriented to  Time, Oriented to Situation      Admission diagnosis:  Altered mental status [R41.82] Altered mental status, unspecified altered mental status type [R41.82] Pneumonia due to infectious organism, unspecified laterality, unspecified part of lung [J18.9] Patient Active Problem List   Diagnosis Date Noted   Acute metabolic encephalopathy 02/07/2023   CAP (community acquired pneumonia) 02/07/2023   Neuropathy 02/07/2023   At risk for polypharmacy 02/07/2023   Peripheral vascular disease, unspecified (HCC) 08/17/2020   Change in mental state 11/05/2019   Stage 3a chronic kidney disease (HCC) 08/08/2018   Benign enlargement of prostate 07/28/2017   Iron deficiency anemia due to chronic blood loss  05/15/2017   Advance directive discussed with patient 05/22/2015   Spinal stenosis, lumbar region, with neurogenic claudication 09/17/2014   Gait disturbance 01/23/2014   Routine general medical examination at a health care facility 10/16/2012   Chronic constipation 01/10/2011   Vitamin B12 deficiency 08/18/2010   Type 2 diabetes mellitus with neurological manifestations, controlled (HCC) 02/17/2007   OSTEOARTHRITIS 02/17/2007   Hypothyroidism 02/15/2007   Hyperlipemia 02/15/2007   Depression, major, recurrent, in partial remission (HCC) 02/15/2007   PCP:  Karie Schwalbe, MD Pharmacy:   [facility], [place] - [address] [place] [address] Phone: [phone] Fax: [phone]  [id number] [place], [place] - [address] AT [facility] [address] [place] [address] Phone: [phone] Fax: [phone]  [facility] - [place], [place] - [address] [place] [address] Phone: [phone] Fax: [phone]     Social Determinants of Health (SDOH) Social History: SDOH Screenings   Food Insecurity: No Food Insecurity (02/07/2023)  Housing: Low Risk  (02/07/2023)  Transportation Needs: No Transportation Needs (02/07/2023)  Utilities: Not At Risk (02/07/2023)  Tobacco Use: Medium Risk (02/07/2023)   SDOH Interventions:     Readmission Risk Interventions     No data to display

## 2023-02-10 NOTE — Progress Notes (Signed)
SLP F/U Note  Patient Details Name: Randy Mcdaniel. MRN: [id number] DOB: [birth year]   Cancelled treatment:       Reason Eval/Treat Not Completed: SLP screened, no needs identified, will sign off (chart reviewed; consulted NSG re: pt's status today. Met w/ pt.)  Met w/ NSG and pt re: his toleration of oral diet. NSG reported pt is swallowing Pills appropriately w/ liquids w/out difficulty. Pt stated he is drinking Ensure and eating a "little" at meals but not much appetite; pt denied any difficulty swallowing. NSG agreed. Discussed general aspiration precautions including sitting fully upright when eating/drinking.  No further skilled ST services indicated at this time. MD to reconsult if new needs arise while admitted.    Jerilynn Som, MS, CCC-SLP Speech Language Pathologist Rehab Services; [facility] [phone] (ascom) Teshara Moree 02/10/2023, 5:07 PM

## 2023-02-11 DIAGNOSIS — G9341 Metabolic encephalopathy: Secondary | ICD-10-CM | POA: Diagnosis not present

## 2023-02-11 LAB — GLUCOSE, CAPILLARY
Glucose-Capillary: 146 mg/dL — ABNORMAL HIGH (ref 70–99)
Glucose-Capillary: 129 mg/dL — ABNORMAL HIGH (ref 70–99)
Glucose-Capillary: 217 mg/dL — ABNORMAL HIGH (ref 70–99)
Glucose-Capillary: 230 mg/dL — ABNORMAL HIGH (ref 70–99)

## 2023-02-11 MED ORDER — CEFUROXIME AXETIL 500 MG PO TABS
500.0000 mg | ORAL_TABLET | Freq: Two times a day (BID) | ORAL | Status: DC
  Administered 2023-02-11 – 2023-02-13 (×4): 500 mg via ORAL
  Filled 2023-02-11 (×5): qty 1

## 2023-02-11 MED ORDER — GLIPIZIDE 5 MG PO TABS
5.0000 mg | ORAL_TABLET | Freq: Two times a day (BID) | ORAL | Status: DC
  Administered 2023-02-11 – 2023-02-13 (×5): 5 mg via ORAL
  Filled 2023-02-11 (×7): qty 1

## 2023-02-11 MED ORDER — AZITHROMYCIN 500 MG PO TABS
500.0000 mg | ORAL_TABLET | Freq: Every day | ORAL | Status: DC
  Administered 2023-02-11 – 2023-02-12 (×2): 500 mg via ORAL
  Filled 2023-02-11 (×2): qty 1

## 2023-02-11 MED ORDER — CEFADROXIL 500 MG PO CAPS: 500.0000 mg | ORAL_CAPSULE | Freq: Two times a day (BID) | ORAL | Status: DC

## 2023-02-11 NOTE — TOC Progression Note (Signed)
Transition of Care (TOC) - Progression Note    Patient Details  Name: Randy Mcdaniel. MRN: [id number] Date of Birth: [birth year]  Transition of Care [facility]) CM/SW Contact  Allena Katz, LCSW Phone Number: 02/11/2023, 11:46 AM  Clinical Narrative:     Berkley Harvey started for patient to discharge to [facility] on Monday.    Expected Discharge Plan: Skilled Nursing Facility Barriers to Discharge: No Barriers Identified  Expected Discharge Plan and Services   Discharge Planning Services: CM Consult                                           Social Determinants of Health (SDOH) Interventions SDOH Screenings   Food Insecurity: No Food Insecurity (02/07/2023)  Housing: Low Risk  (02/07/2023)  Transportation Needs: No Transportation Needs (02/07/2023)  Utilities: Not At Risk (02/07/2023)  Tobacco Use: Medium Risk (02/07/2023)    Readmission Risk Interventions     No data to display

## 2023-02-11 NOTE — Progress Notes (Addendum)
Progress Note   Patient: Randy Mcdaniel. ZOX:[id number] DOB: Jun 12, 1941 DOA: 02/07/2023     4 DOS: the patient was seen and examined on 02/11/2023   Brief hospital course: Mr. Ivar Persky is a 82 year old male with hyperlipidemia, hypertension, hypothyroid, who presented on 02/07/2023 for evaluation of altered mental status, fatigue and increased shortness of breath for 305 days.  Vitals in the ED were normal.  Labs were notable for glucose 273, BUN 38, Cr 1.68.  CBC was normal including WBC of 9.2k.  Chest xray was concerning for pneumonia, showing left basilar patchy opacity.  CT head was non-acute.   Pt was admitted to the hospital and started on empiric IV antibiotics for community-acquired versus aspiration pneumonia.  Further hospital course and management as outlined below.  Pt is medically stable awaiting SNF/rehab placement for d/c.  Anticipate d/c on Monday 6/17 to [facility].   Assessment and Plan: * Acute metabolic encephalopathy Most likely due to pneumonia. CT head was non-acute. 6/13, 6/14: mentation at baseline 6/15: pt lethargic today. No focal deficits on neuro exam.  Suspect hospital delirium --Mgmt of pneumonia as outlined, see CAP --Delirium, fall & aspiration precautions --Hold / avoid sedating meds if lethargic  CAP (community acquired pneumonia) Treated with IV Rocephin, Zithromax Transition to PO Ceftin, Zithromax to complete 7 day course. Scheduled Mucinex BID Albuterol nebs PRN IS & Flutter valve SLP evaluated swallow & cleared for regular diet, thin liquids. No signs of aspiration on evaluation. O2 per protocol -- pt on 2-3 L/min, but no charted O2 sats below 90%.  No clear diagnosis of acute respiratory failure at this time.  At risk for polypharmacy Pt takes Lyrica 25 mg BID, gabapentin 400 mg QHS - these are continued, but should be held if pt is not awake and alert.  Neuropathy Continue home Lyrica 25 mg BID Pt also takes gabapentin  [phone] mg nightly, but given AMS, was given reduced dose 400 mg night of admission.  Continue 400 mg gabapentin QHS.  --Hold both if pt has altered mental status / lethargy / not awake or alert   Stage 3a chronic kidney disease (HCC) AKI - Cr 1.68 on admission, up from recent baseline Cr of 1.18-1.35.  Due to pre-renal azotemia - wife reports 3-4 days poor PO intake at home. Cr improved 1.47 >> 1.35 >> 1.25 At baseline. --Monitor BMP --Renally dose meds & avoid nephrotoxins  Benign enlargement of prostate No longer takes Flomax. Monitor for urinary retention PRN.  Chronic constipation No longer takes Linzess. Scheduled Miralax and Senna-S ordered.  Depression, major, recurrent, in partial remission (HCC) Continue home Lexapro, Olanzapine at bedtime  Hyperlipemia Continue Lipitor  Type 2 diabetes mellitus with neurological manifestations, controlled (HCC) Hold home metformin, glipizide. Sliding scale Novolog Titrate insulin for inpatient goal 140-180  Hypothyroidism Continue levothyroxine         Subjective: Pt was seen with daughter at bedside this AM.  He is very tired, lethargic this morning.  Reports he slept okay overnight.  Denies cough, SOB or other complaints.  Says he's just very tired today.    Physical Exam: Vitals:   02/10/23 1538 02/10/23 2035 02/11/23 0440 02/11/23 0750  BP: 118/68 121/76 (!) 141/73 (!) 167/66  Pulse: 87 74 67 (!) 57  Resp: 16 18 18 16   Temp: (!) 97.3 F (36.3 C) 98.5 F (36.9 C) 98.7 F (37.1 C) 97.8 F (36.6 C)  TempSrc:  Oral Oral Oral  SpO2: 92% 93% 93%  93%  Weight:      Height:       General exam: awake but very drowsy, wakes briefly, no acute distress HEENT: moist mucus membranes, hearing grossly normal  Respiratory system: CTAB, no wheezes or rhonchi, normal respiratory effort at rest, on room air. Cardiovascular system: normal S1/S2, RRR, no pedal edema.   Gastrointestinal system: soft, NT, ND Central nervous system:  A&O x 3. normal speech, intact symmetric 5/5 motor in b/l upper and lower extremities Extremities: moves all, no edema, normal tone Skin: dry, intact, normal temperature Psychiatry: normal mood, normal affect, judgment and insight appear normal   Data Reviewed:  No new labs today.  CBG's 209 >> 275 >> 323 >>  143 >> 146 >> 129.    Family Communication: wife at bedside 6/12, 6/14.  Daughter at bedside today 6/15.   Disposition: Status is: Inpatient Remains inpatient appropriate because: remains on IV antibiotics pending further clinical improvement and weaning off oxygen   Planned Discharge Destination: Home    Time spent: 36 minutes  Author: Pennie Banter, DO 02/11/2023 1:03 PM  For on call review [email or web address].

## 2023-02-12 DIAGNOSIS — G9341 Metabolic encephalopathy: Secondary | ICD-10-CM | POA: Diagnosis not present

## 2023-02-12 LAB — CBC
HCT: 46 % (ref 39.0–52.0)
Hemoglobin: 14.5 g/dL (ref 13.0–17.0)
MCH: 29.8 pg (ref 26.0–34.0)
MCHC: 31.5 g/dL (ref 30.0–36.0)
MCV: 94.5 fL (ref 80.0–100.0)
Platelets: 233 10*3/uL (ref 150–400)
RBC: 4.87 MIL/uL (ref 4.22–5.81)
RDW: 14 % (ref 11.5–15.5)
WBC: 6.3 10*3/uL (ref 4.0–10.5)
nRBC: 0 % (ref 0.0–0.2)

## 2023-02-12 LAB — BASIC METABOLIC PANEL
Anion gap: 7 (ref 5–15)
BUN: 29 mg/dL — ABNORMAL HIGH (ref 8–23)
CO2: 28 mmol/L (ref 22–32)
Calcium: 9.1 mg/dL (ref 8.9–10.3)
Chloride: 102 mmol/L (ref 98–111)
Creatinine, Ser: 1.54 mg/dL — ABNORMAL HIGH (ref 0.61–1.24)
GFR, Estimated: 44 mL/min — ABNORMAL LOW (ref >60)
Glucose, Bld: 125 mg/dL — ABNORMAL HIGH (ref 70–99)
Potassium: 4.3 mmol/L (ref 3.5–5.1)
Sodium: 137 mmol/L (ref 135–145)

## 2023-02-12 LAB — AMMONIA: Ammonia: 13 umol/L (ref 9–35)

## 2023-02-12 LAB — TSH: TSH: 5.721 u[IU]/mL — ABNORMAL HIGH (ref 0.350–4.500)

## 2023-02-12 LAB — GLUCOSE, CAPILLARY
Glucose-Capillary: 144 mg/dL — ABNORMAL HIGH (ref 70–99)
Glucose-Capillary: 219 mg/dL — ABNORMAL HIGH (ref 70–99)
Glucose-Capillary: 249 mg/dL — ABNORMAL HIGH (ref 70–99)
Glucose-Capillary: 267 mg/dL — ABNORMAL HIGH (ref 70–99)

## 2023-02-12 NOTE — Progress Notes (Signed)
Progress Note   Patient: Randy Mcdaniel. ZOX:[id number] DOB: July 31, 1941 DOA: 02/07/2023     5 DOS: the patient was seen and examined on 02/12/2023   Brief hospital course: [name] is a 82 year old male with hyperlipidemia, hypertension, hypothyroid, who presented on 02/07/2023 for evaluation of altered mental status, fatigue and increased shortness of breath for 305 days.  Vitals in the ED were normal.  Labs were notable for glucose 273, BUN 38, Cr 1.68.  CBC was normal including WBC of 9.2k.  Chest xray was concerning for pneumonia, showing left basilar patchy opacity.  CT head was non-acute.   Pt was admitted to the hospital and started on empiric IV antibiotics for community-acquired versus aspiration pneumonia.  Further hospital course and management as outlined below.  Pt is medically stable awaiting SNF/rehab placement for d/c.  Anticipate d/c on Monday 6/17 to [facility].   Assessment and Plan: * Acute metabolic encephalopathy Most likely due to pneumonia. CT head was non-acute. 6/13, 6/14: mentation at baseline 6/15: pt lethargic today. No focal deficits on neuro exam.  Suspect hospital delirium 6/16: mentation at baseline --Mgmt of pneumonia as outlined, see CAP --Delirium, fall & aspiration precautions --Hold / avoid sedating meds if lethargic  CAP (community acquired pneumonia) Treated with IV Rocephin, Zithromax Transition to PO Ceftin, Zithromax to complete 7 day course. Scheduled Mucinex BID Albuterol nebs PRN IS & Flutter valve SLP evaluated swallow & cleared for regular diet, thin liquids. No signs of aspiration on evaluation. O2 per protocol -- pt on 2-3 L/min, but no charted O2 sats below 90%.  No clear diagnosis of acute respiratory failure at this time.  At risk for polypharmacy Pt takes Lyrica 25 mg BID, gabapentin 400 mg QHS - these are continued, but should be held if pt is not awake and alert.  Neuropathy Continue home Lyrica 25 mg  BID Pt also takes gabapentin [phone] mg nightly, but given AMS, was given reduced dose 400 mg night of admission.  Continue 400 mg gabapentin QHS.  --Hold both if pt has altered mental status / lethargy / not awake or alert   Stage 3a chronic kidney disease (HCC) AKI - Cr 1.68 on admission, up from recent baseline Cr of 1.18-1.35.  Due to pre-renal azotemia - wife reports 3-4 days poor PO intake at home. Cr improved 1.47 >> 1.35 >> 1.25 >> 1.54.   At baseline overall, slight bump in Cr today likely due to lethargy yesterday and less PO intake. --Monitor BMP --Encourage PO hydration --Renally dose meds & avoid nephrotoxins  Benign enlargement of prostate No longer takes Flomax. Monitor for urinary retention PRN.  Chronic constipation No longer takes Linzess. Scheduled Miralax and Senna-S ordered.  Depression, major, recurrent, in partial remission (HCC) Continue home Lexapro, Olanzapine at bedtime  Hyperlipemia Continue Lipitor  Type 2 diabetes mellitus with neurological manifestations, controlled (HCC) Hold home metformin, glipizide. Sliding scale Novolog Titrate insulin for inpatient goal 140-180  Hypothyroidism Continue levothyroxine         Subjective: Pt was seen with wife at bedside this AM, he was up in recliner.  He reports being tired but better than yesterday.  Otherwise just feels weak in general. No other acute complaints.  Ready to be out of this hospital. Says "nights here are rough".    Physical Exam: Vitals:   02/11/23 2043 02/11/23 2044 02/12/23 0634 02/12/23 0728  BP: 135/75  116/65 127/82  Pulse: 67 66 (!) 52 82  Resp:  18  16 18   Temp: 99.8 F (37.7 C)  98.3 F (36.8 C) 98 F (36.7 C)  TempSrc:      SpO2: (!) 87% 93% 97% 94%  Weight:      Height:       General exam: awake alert, no acute distress HEENT: moist mucus membranes, hearing grossly normal  Respiratory system: CTAB, no wheezes or rhonchi, normal respiratory effort at rest, on room  air. Cardiovascular system: normal S1/S2, RRR, no pedal edema.   Gastrointestinal system: soft, NT, ND Central nervous system: A&O x 3. normal speech, grossly non-focal exam Extremities: moves all, no edema, normal tone Skin: dry, intact, normal temperature Psychiatry: normal mood, normal affect, judgment and insight appear normal   Data Reviewed:  Notable labs -- glucose 125, BUN 29, Cr 1.54, GFR 44, normal CBC.  CBG's 129 >> 217 >> 230 >> 144.    Family Communication: wife at bedside 6/12, 6/14, 6/16.   Daughter at bedside 6/15.   Disposition: Status is: Inpatient Remains inpatient appropriate because: needs SNF/rehab placement   Planned Discharge Destination: Home    Time spent: 36 minutes  Author: Pennie Banter, DO 02/12/2023 11:41 AM  For on call review [email or web address].

## 2023-02-13 DIAGNOSIS — E1149 Type 2 diabetes mellitus with other diabetic neurological complication: Secondary | ICD-10-CM | POA: Diagnosis not present

## 2023-02-13 DIAGNOSIS — G9341 Metabolic encephalopathy: Secondary | ICD-10-CM | POA: Diagnosis not present

## 2023-02-13 DIAGNOSIS — J189 Pneumonia, unspecified organism: Secondary | ICD-10-CM | POA: Diagnosis not present

## 2023-02-13 DIAGNOSIS — N1831 Chronic kidney disease, stage 3a: Secondary | ICD-10-CM | POA: Diagnosis not present

## 2023-02-13 DIAGNOSIS — E039 Hypothyroidism, unspecified: Secondary | ICD-10-CM | POA: Diagnosis not present

## 2023-02-13 DIAGNOSIS — Z7401 Bed confinement status: Secondary | ICD-10-CM | POA: Diagnosis not present

## 2023-02-13 DIAGNOSIS — B353 Tinea pedis: Secondary | ICD-10-CM | POA: Diagnosis not present

## 2023-02-13 DIAGNOSIS — E1140 Type 2 diabetes mellitus with diabetic neuropathy, unspecified: Secondary | ICD-10-CM | POA: Diagnosis not present

## 2023-02-13 DIAGNOSIS — M6281 Muscle weakness (generalized): Secondary | ICD-10-CM | POA: Diagnosis not present

## 2023-02-13 DIAGNOSIS — Z9189 Other specified personal risk factors, not elsewhere classified: Secondary | ICD-10-CM | POA: Diagnosis not present

## 2023-02-13 DIAGNOSIS — K5909 Other constipation: Secondary | ICD-10-CM | POA: Diagnosis not present

## 2023-02-13 DIAGNOSIS — R2681 Unsteadiness on feet: Secondary | ICD-10-CM | POA: Diagnosis not present

## 2023-02-13 DIAGNOSIS — E1122 Type 2 diabetes mellitus with diabetic chronic kidney disease: Secondary | ICD-10-CM | POA: Diagnosis not present

## 2023-02-13 DIAGNOSIS — I739 Peripheral vascular disease, unspecified: Secondary | ICD-10-CM | POA: Diagnosis not present

## 2023-02-13 DIAGNOSIS — R278 Other lack of coordination: Secondary | ICD-10-CM | POA: Diagnosis not present

## 2023-02-13 DIAGNOSIS — F3341 Major depressive disorder, recurrent, in partial remission: Secondary | ICD-10-CM | POA: Diagnosis not present

## 2023-02-13 DIAGNOSIS — R2689 Other abnormalities of gait and mobility: Secondary | ICD-10-CM | POA: Diagnosis not present

## 2023-02-13 DIAGNOSIS — R531 Weakness: Secondary | ICD-10-CM | POA: Diagnosis not present

## 2023-02-13 LAB — GLUCOSE, CAPILLARY
Glucose-Capillary: 130 mg/dL — ABNORMAL HIGH (ref 70–99)
Glucose-Capillary: 245 mg/dL — ABNORMAL HIGH (ref 70–99)

## 2023-02-13 MED ORDER — LACTULOSE 10 GM/15ML PO SOLN: 20.0000 g | Freq: Two times a day (BID) | ORAL | 0 refills | Status: DC | PRN

## 2023-02-13 MED ORDER — ADULT MULTIVITAMIN W/MINERALS CH: 1.0000 | ORAL_TABLET | Freq: Every day | ORAL | Status: DC

## 2023-02-13 MED ORDER — CEFUROXIME AXETIL 500 MG PO TABS: 500.0000 mg | ORAL_TABLET | Freq: Two times a day (BID) | ORAL | 0 refills | Status: AC

## 2023-02-13 MED ORDER — AZITHROMYCIN 500 MG PO TABS: 500.0000 mg | ORAL_TABLET | Freq: Every day | ORAL | 0 refills | Status: AC

## 2023-02-13 MED ORDER — GUAIFENESIN ER 600 MG PO TB12: 600.0000 mg | ORAL_TABLET | Freq: Two times a day (BID) | ORAL | Status: DC | PRN

## 2023-02-13 MED ORDER — SENNOSIDES-DOCUSATE SODIUM 8.6-50 MG PO TABS: 1.0000 | ORAL_TABLET | Freq: Two times a day (BID) | ORAL | Status: DC

## 2023-02-13 MED ORDER — ENSURE ENLIVE PO LIQD: 237.0000 mL | Freq: Three times a day (TID) | ORAL | 12 refills | Status: DC

## 2023-02-13 MED ORDER — POLYETHYLENE GLYCOL 3350 17 G PO PACK: 17.0000 g | PACK | Freq: Every day | ORAL | 0 refills | Status: DC

## 2023-02-13 MED ORDER — TAMSULOSIN HCL 0.4 MG PO CAPS: 0.4000 mg | ORAL_CAPSULE | Freq: Every day | ORAL | Status: DC

## 2023-02-13 NOTE — Discharge Summary (Signed)
Physician Discharge Summary   Patient: Randy Mcdaniel. MRN: [id number] DOB: [birth year]  Admit date:     02/07/2023  Discharge date: 02/13/23  Discharge Physician: Pennie Banter   PCP: Randy Schwalbe, MD   Recommendations at discharge:    Follow up with Primary Care in 1-2 weeks Follow up with Urology if ongoing difficulty voiding despite taking Flomax daily Repeat CBC, BMP in 1-2 weeks  Discharge Diagnoses: Principal Problem:   Acute metabolic encephalopathy Active Problems:   CAP (community acquired pneumonia)   Hypothyroidism   Type 2 diabetes mellitus with neurological manifestations, controlled (HCC)   Hyperlipemia   Depression, major, recurrent, in partial remission (HCC)   Chronic constipation   Benign enlargement of prostate   Stage 3a chronic kidney disease (HCC)   Neuropathy   At risk for polypharmacy  Resolved Problems:   * No resolved hospital problems. [facility] Course:  Randy Mcdaniel is a 82 year old male with hyperlipidemia, hypertension, hypothyroid, who presented on 02/07/2023 for evaluation of altered mental status, fatigue and increased shortness of breath for 305 days.   Vitals in the ED were normal.  Labs were notable for glucose 273, BUN 38, Cr 1.68.  CBC was normal including WBC of 9.2k.   Chest xray was concerning for pneumonia, showing left basilar patchy opacity.  CT head was non-acute.     Pt was admitted to the hospital and started on empiric IV antibiotics for community-acquired versus aspiration pneumonia.   Further hospital course and management as outlined below.   6/17 -- pt has remained medically stable for dc to SNF/rehab at [facility].   Assessment and Plan: * Acute metabolic encephalopathy Most likely due to pneumonia. CT head was non-acute. 6/13, 6/14: mentation at baseline 6/15: pt lethargic today. No focal deficits on neuro exam.  Suspect hospital delirium 6/16, 17: mentation at baseline --Mgmt of pneumonia as  outlined, see CAP --Delirium, fall & aspiration precautions --Hold / avoid sedating meds if lethargic  CAP (community acquired pneumonia) Treated with IV Rocephin, Zithromax Transition to PO Ceftin, Zithromax to complete 7 day course. Scheduled Mucinex BID Albuterol nebs PRN IS & Flutter valve SLP evaluated swallow & cleared for regular diet, thin liquids. No signs of aspiration on evaluation. O2 per protocol -- pt on 2-3 L/min, but no charted O2 sats below 90%.  No clear diagnosis of acute respiratory failure at this time.  At risk for polypharmacy Pt takes Lyrica 25 mg BID, gabapentin 400 mg QHS - these are continued, but should be held if pt is not awake and alert.  Neuropathy Continue home Lyrica 25 mg BID Pt also takes gabapentin [phone] mg nightly, but given AMS, was given reduced dose 400 mg night of admission.  Continue 400 mg gabapentin QHS.  --Hold both if pt has altered mental status / lethargy / not awake or alert   Stage 3a chronic kidney disease (HCC) AKI - Cr 1.68 on admission, up from recent baseline Cr of 1.18-1.35.  Due to pre-renal azotemia - wife reports 3-4 days poor PO intake at home. Cr improved 1.47 >> 1.35 >> 1.25 >> 1.54.   At baseline overall, slight bump in Cr today likely due to lethargy yesterday and less PO intake. --Monitor BMP --Encourage PO hydration --Renally dose meds & avoid nephrotoxins  Benign enlargement of prostate No longer takes Flomax. Monitor for urinary retention PRN. 6/17 -- pt unable to void this AM --Flomax resumed --Bladder scan PRN to check  for retention --In/out cath in retaining > 400 cc  --Follow up with Urology if persistent despite Flomax  Chronic constipation No longer takes Linzess. Scheduled Miralax and Senna-S ordered.  Depression, major, recurrent, in partial remission (HCC) Continue home Lexapro, Olanzapine at bedtime  Hyperlipemia Continue Lipitor  Type 2 diabetes mellitus with neurological manifestations,  controlled (HCC) Hold home metformin, glipizide. Sliding scale Novolog Titrate insulin for inpatient goal 140-180  Hypothyroidism Continue levothyroxine          Consultants: None Procedures performed: None   Disposition: Skilled nursing facility  Diet recommendation:  Cardiac and Carb modified diet  DISCHARGE MEDICATION: Allergies as of 02/13/2023       Reactions   Vytorin [ezetimibe-simvastatin] Nausea Only   patient nor wife does not remember        Medication List     STOP taking these medications    linaclotide 145 MCG Caps capsule Commonly known as: LINZESS       TAKE these medications    acetaminophen 650 MG CR tablet Commonly known as: TYLENOL Take 650 mg by mouth every 8 (eight) hours as needed for pain.   atorvastatin 20 MG tablet Commonly known as: LIPITOR TAKE ONE TABLET BY MOUTH ONCE DAILY   azithromycin 500 MG tablet Commonly known as: ZITHROMAX Take 1 tablet (500 mg total) by mouth daily for 1 day.   cefUROXime 500 MG tablet Commonly known as: CEFTIN Take 1 tablet (500 mg total) by mouth 2 (two) times daily with a meal for 1 day.   cyanocobalamin 1000 MCG tablet Commonly known as: VITAMIN B12 Take 1,000 mcg by mouth daily.   escitalopram 20 MG tablet Commonly known as: LEXAPRO TAKE 2 TABLETS BY MOUTH EVERY MORNING   feeding supplement Liqd Take 237 mLs by mouth 3 (three) times daily between meals.   gabapentin 300 MG capsule Commonly known as: NEURONTIN TAKE 3 TO 4 CAPSULES [phone] MG TOTAL)BY MOUTH AT BEDTIME   glipiZIDE 5 MG tablet Commonly known as: GLUCOTROL TAKE 1 TABLET BY MOUTH TWICE (2) DAILY BEFORE A MEAL   guaiFENesin 600 MG 12 hr tablet Commonly known as: MUCINEX Take 1 tablet (600 mg total) by mouth 2 (two) times daily as needed.   Jardiance 25 MG Tabs tablet Generic drug: empagliflozin TAKE 1 TABLET BY MOUTH ONCE A DAY BEFOREBREAKFAST   lactulose 10 GM/15ML solution Commonly known as: CHRONULAC Take  30 mLs (20 g total) by mouth 2 (two) times daily as needed for mild constipation or moderate constipation.   levothyroxine 75 MCG tablet Commonly known as: SYNTHROID TAKE ONE TABLET BY MOUTH ONCE DAILY   metFORMIN 500 MG tablet Commonly known as: GLUCOPHAGE TAKE 1 TABLET BY MOUTH 3 TIMES DAILY   methylphenidate 5 MG tablet Commonly known as: RITALIN TAKE 1 TABLET BY MOUTH TWICE A DAY   multivitamin with minerals Tabs tablet Take 1 tablet by mouth daily. Start taking on: February 14, 2023   OLANZapine 5 MG tablet Commonly known as: ZYPREXA TAKE ONE TABLET BY MOUTH AT BEDTIME   polyethylene glycol 17 g packet Commonly known as: MIRALAX / GLYCOLAX Take 17 g by mouth daily. Start taking on: February 14, 2023   pregabalin 25 MG capsule Commonly known as: LYRICA TAKE ONE CAPSULE BY MOUTH TWICE DAILY   senna-docusate 8.6-50 MG tablet Commonly known as: Senokot-S Take 1 tablet by mouth 2 (two) times daily.   tamsulosin 0.4 MG Caps capsule Commonly known as: FLOMAX TAKE ONE CAPSULE BY MOUTH DAILY  Discharge Exam: Filed Weights   02/07/23 1233 02/07/23 2127  Weight: 90.7 kg 86.3 kg   General exam: awake, alert, no acute distress HEENT: atraumatic, clear conjunctiva, anicteric sclera, moist mucus membranes, hearing grossly normal  Respiratory system: CTAB, no wheezes, rales or rhonchi, normal respiratory effort. Cardiovascular system: normal S1/S2, RRR, no pedal edema.   Gastrointestinal system: soft, NT, ND, no HSM felt, +bowel sounds. Central nervous system: A&O x 3. no gross focal neurologic deficits, normal speech Extremities: moves all, no edema, normal tone Skin: dry, intact, normal temperature Psychiatry: normal mood, congruent affect, judgement and insight appear normal   Condition at discharge: stable  The results of significant diagnostics from this hospitalization (including imaging, microbiology, ancillary and laboratory) are listed below for reference.    Imaging Studies: DG Chest 2 View  Result Date: 02/07/2023 CLINICAL DATA:  Altered mental status and cough EXAM: CHEST - 2 VIEW COMPARISON:  None Available. FINDINGS: Elevation of the right hemidiaphragm with interposition of colon. Normal left lung volume. Left basilar patchy opacities. No pleural effusion or pneumothorax. The heart size and mediastinal contours are within normal limits. Degenerative changes of the bilateral glenohumeral joints. IMPRESSION: Left basilar patchy opacities, which may represent atelectasis, aspiration, or pneumonia. Electronically Signed   By: Agustin Cree M.D.   On: 02/07/2023 18:03   CT HEAD WO CONTRAST  Result Date: 02/07/2023 CLINICAL DATA:  Altered mental status, nontraumatic (Ped 0-17y) EXAM: CT HEAD WITHOUT CONTRAST TECHNIQUE: Contiguous axial images were obtained from the base of the skull through the vertex without intravenous contrast. RADIATION DOSE REDUCTION: This exam was performed according to the departmental dose-optimization program which includes automated exposure control, adjustment of the mA and/or kV according to patient size and/or use of iterative reconstruction technique. COMPARISON:  None Available. FINDINGS: Brain: No evidence of acute infarction, hemorrhage, hydrocephalus, extra-axial collection or mass lesion/mass effect. There is sequela of moderate chronic microvascular ischemic change with generalized volume loss. Vascular: No hyperdense vessel or unexpected calcification. Skull: Normal. Negative for fracture or focal lesion. Sinuses/Orbits: No middle ear or mastoid effusion. Paranasal sinuses are clear. Orbits are unremarkable. Other: None. IMPRESSION: No acute intracranial abnormality. Electronically Signed   By: Lorenza Cambridge M.D.   On: 02/07/2023 13:13    Microbiology: Results for orders placed or performed during the hospital encounter of 10/29/19  Urine Culture     Status: Abnormal   Collection Time: 10/29/19  3:57 PM   Specimen:  Urine, Random  Result Value Ref Range Status   Specimen Description URINE, RANDOM  Final   Special Requests NONE  Final   Culture (A)  Final    <10,000 COLONIES/mL INSIGNIFICANT GROWTH Performed at [facility], [address] [place], [place] [address]    Report Status 10/31/2019 FINAL  Final    Labs: CBC: Recent Labs  Lab 02/07/23 1243 02/08/23 0508 02/12/23 0449  WBC 9.2 8.3 6.3  NEUTROABS 7.2  --   --   HGB 15.9 14.0 14.5  HCT 48.6 43.7 46.0  MCV 91.2 93.6 94.5  PLT 309 248 233   Basic Metabolic Panel: Recent Labs  Lab 02/07/23 1243 02/08/23 0508 02/09/23 0440 02/10/23 0530 02/12/23 0449  NA 136 139 137 139 137  K 4.2 3.6 4.0 4.1 4.3  CL 101 105 104 103 102  CO2 21* 26 26 27 28   GLUCOSE 273* 115* 138* 148* 125*  BUN 38* 36* 35* 30* 29*  CREATININE 1.68* 1.47* 1.35* 1.25* 1.54*  CALCIUM 9.8 9.0  9.1 9.5 9.1  MG  --   --  2.2  --   --    Liver Function Tests: Recent Labs  Lab 02/07/23 1243  AST 75*  ALT 32  ALKPHOS 67  BILITOT 1.2  PROT 7.9  ALBUMIN 4.6   CBG: Recent Labs  Lab 02/12/23 0730 02/12/23 1225 02/12/23 1559 02/12/23 2158 02/13/23 0757  GLUCAP 144* 219* 249* 267* 130*    Discharge time spent: greater than 30 minutes.  Signed: Pennie Banter, DO [facility] 02/13/2023

## 2023-02-13 NOTE — Progress Notes (Signed)
Patient is alert and oriented  x3 forgetful. Need reminder. Get up with 1 assist. Complaint with care. Vital sign with in normal limit. Pt medically ready for discharge. PIV removed. Discharging to [facility]. Report called in to Nurse varnice RN. Patient will transport via EMS.

## 2023-02-13 NOTE — Progress Notes (Signed)
Physical Therapy Treatment Patient Details Name: Randy Mcdaniel. MRN: [id number] DOB: [birth year] Today's Date: 02/13/2023   History of Present Illness [name] is a 82 year old male with hyperlipidemia, hypertension, hypothyroid, who presents to the emergency department for chief concerns of altered mental status.    PT Comments    Patient sitting EOB with nursing for transfer, SPT took over transfer for nursing could complete a linen change. Mod assist for sit<>stand transfer with RW.Performed lateral weight shift x 20 in standing to prepare for walking. Ambulated 10 feet with RW, mod assist for balance. Performed seated exercises in recliner; Seated marches b/l x 10, seated LAQ's b/l x 5. Patient left in recliner, alarm on, bell in reach and family in room.    Recommendations for follow up therapy are one component of a multi-disciplinary discharge planning process, led by the attending physician.  Recommendations may be updated based on patient status, additional functional criteria and insurance authorization.  Follow Up Recommendations  Can patient physically be transported by private vehicle: Yes    Assistance Recommended at Discharge Intermittent Supervision/Assistance  Patient can return home with the following Help with stairs or ramp for entrance;Assist for transportation;Assistance with cooking/housework;A lot of help with bathing/dressing/bathroom;A lot of help with walking and/or transfers   Equipment Recommendations  None recommended by PT    Recommendations for Other Services       Precautions / Restrictions Precautions Precautions: Fall Restrictions Weight Bearing Restrictions: No     Mobility  Bed Mobility               General bed mobility comments: Pt. already sitting EOB upon arrival.    Transfers Overall transfer level: Needs assistance Equipment used: Rolling walker (2 wheels) Transfers: Sit to/from Stand Sit to Stand: Mod assist            General transfer comment: Patient stood with RW at EOB with mod assist. Cueing needed for sequencing.    Ambulation/Gait Ambulation/Gait assistance: Mod assist Gait Distance (Feet): 10 Feet Assistive device: Rolling walker (2 wheels) Gait Pattern/deviations: Step-to pattern, Shuffle, Staggering left, Staggering right, Leaning posteriorly, Narrow base of support, Knee flexed in stance - left, Knee flexed in stance - right, Decreased step length - right, Decreased step length - left       General Gait Details: Patient able to ambulate with mod assist for balance. Increased shuffling and decreased step clearance during ambulation.   Stairs             Wheelchair Mobility    Modified Rankin (Stroke Patients Only)       Balance Overall balance assessment: Needs assistance Sitting-balance support: Feet supported, Bilateral upper extremity supported Sitting balance-Leahy Scale: Fair Sitting balance - Comments: requires intermittent mod assist to maintain sitting balance.   Standing balance support: Bilateral upper extremity supported, During functional activity, Reliant on assistive device for balance Standing balance-Leahy Scale: Fair Standing balance comment: Stood with mod assist for balance, able to demonstrate lateral weight shifts with out LOB.                            Cognition Arousal/Alertness: Awake/alert Behavior During Therapy: Flat affect, WFL for tasks assessed/performed Overall Cognitive Status: Within Functional Limits for tasks assessed  General Comments: Patient was AOx4 today. Was able to communicate clearly, state his goals, hold a conversation and follow commands.        Exercises General Exercises - Lower Extremity Long Arc Quad: AROM, Both, 5 reps, Seated Hip Flexion/Marching: AROM, Both, 10 reps, Seated Other Exercises Other Exercises: Lateral weight shifts x 15 b/l.     General Comments        Pertinent Vitals/Pain Pain Assessment Pain Assessment: No/denies pain    Home Living                          Prior Function            PT Goals (current goals can now be found in the care plan section) Acute Rehab PT Goals Patient Stated Goal: To go home PT Goal Formulation: With patient/family Time For Goal Achievement: 02/23/23 Potential to Achieve Goals: Fair Progress towards PT goals: Progressing toward goals    Frequency    Min 2X/week      PT Plan Current plan remains appropriate    Co-evaluation              AM-PAC PT "6 Clicks" Mobility   Outcome Measure  Help needed turning from your back to your side while in a flat bed without using bedrails?: A Little Help needed moving from lying on your back to sitting on the side of a flat bed without using bedrails?: A Little Help needed moving to and from a bed to a chair (including a wheelchair)?: A Lot Help needed standing up from a chair using your arms (e.g., wheelchair or bedside chair)?: A Lot Help needed to walk in hospital room?: A Lot Help needed climbing 3-5 steps with a railing? : A Lot 6 Click Score: 14    End of Session Equipment Utilized During Treatment: Gait belt Activity Tolerance: Patient tolerated treatment well Patient left: in chair;with call bell/phone within reach;with chair alarm set;with family/visitor present Nurse Communication: Mobility status PT Visit Diagnosis: Unsteadiness on feet (R26.81);Other abnormalities of gait and mobility (R26.89);Muscle weakness (generalized) (M62.81);Difficulty in walking, not elsewhere classified (R26.2)     Time: 4098-1191 PT Time Calculation (min) (ACUTE ONLY): 10 min  Charges:                        Malachi Carl, SPT   Malachi Carl 02/13/2023, 11:48 AM

## 2023-02-13 NOTE — Plan of Care (Signed)

## 2023-02-13 NOTE — TOC Transition Note (Signed)
Transition of Care [facility]) - CM/SW Discharge Note   Patient Details  Name: Randy Mcdaniel. MRN: [id number] Date of Birth: 05/15/1941  [facility]) CM/SW Contact:  Garret Reddish, RN Phone Number: 02/13/2023, 11:50 AM   Clinical Narrative:   Chart reviewed.  Noted that patient will be a discharge for today to [facility].    I have received SNF authorization today.  Plan Auth ID- [id number], Auth ID- [id number] Approved dates 02-11-23- 02-14-23.  Next review date- 02-14-23.    I have informed Sue Lush, Admission Coordinator at [facility].  She has informed me that she will have a bed today.  Sue Lush informs me that patient will be going to room 117 and the number to call report is [phone].    I have informed patient and [name] that patient will be discharging to [facility] today.  I have informed patient and his wife of room number at [facility].  I have informed patient and his wife that [facility] will transport patient to the facility today.    I have arranged EMS transport via [facility] to transport patient to [facility] today.    I have informed staff nurse of the above information.      Final next level of care: Skilled Nursing Facility Barriers to Discharge: No Barriers Identified   Patient Goals and CMS Choice CMS Medicare.gov Compare Post Acute Care list provided to:: Patient Choice offered to / list presented to : Patient  Discharge Placement                Patient chooses bed at: [facility] Patient to be transferred to facility by: [facility] Name of family member notified: [name] Patient and family notified of of transfer: 02/13/23  Discharge Plan and Services Additional resources added to the After Visit Summary for     Discharge Planning Services: CM Consult                                 Social Determinants of Health (SDOH) Interventions SDOH Screenings   Food Insecurity: No Food Insecurity  (02/07/2023)  Housing: Low Risk  (02/07/2023)  Transportation Needs: No Transportation Needs (02/07/2023)  Utilities: Not At Risk (02/07/2023)  Tobacco Use: Medium Risk (02/07/2023)     Readmission Risk Interventions     No data to display

## 2023-02-13 NOTE — Care Management Important Message (Signed)
Important Message  Patient Details  Name: Randy Mcdaniel. MRN: [id number] Date of Birth: Apr 10, 1941   Medicare Important Message Given:  Yes  Reviewed with patient and wife Randy Mcdaniel.   Olegario Messier A Deontrey Massi 02/13/2023, 9:22 AM

## 2023-02-15 ENCOUNTER — Encounter: Payer: Self-pay | Admitting: Student

## 2023-02-15 ENCOUNTER — Non-Acute Institutional Stay (SKILLED_NURSING_FACILITY): Payer: Medicare PPO | Admitting: Student

## 2023-02-15 DIAGNOSIS — G9341 Metabolic encephalopathy: Secondary | ICD-10-CM | POA: Diagnosis not present

## 2023-02-15 DIAGNOSIS — E039 Hypothyroidism, unspecified: Secondary | ICD-10-CM | POA: Diagnosis not present

## 2023-02-15 DIAGNOSIS — K5909 Other constipation: Secondary | ICD-10-CM

## 2023-02-15 DIAGNOSIS — B353 Tinea pedis: Secondary | ICD-10-CM

## 2023-02-15 DIAGNOSIS — N1831 Chronic kidney disease, stage 3a: Secondary | ICD-10-CM

## 2023-02-15 DIAGNOSIS — I739 Peripheral vascular disease, unspecified: Secondary | ICD-10-CM | POA: Diagnosis not present

## 2023-02-15 DIAGNOSIS — E1149 Type 2 diabetes mellitus with other diabetic neurological complication: Secondary | ICD-10-CM | POA: Diagnosis not present

## 2023-02-15 DIAGNOSIS — F3341 Major depressive disorder, recurrent, in partial remission: Secondary | ICD-10-CM

## 2023-02-15 DIAGNOSIS — Z9189 Other specified personal risk factors, not elsewhere classified: Secondary | ICD-10-CM | POA: Diagnosis not present

## 2023-02-15 NOTE — Progress Notes (Addendum)
Provider:  Dr. Earnestine Mealing Location:  Other [place].  Nursing Home Room Number: [facility] 117A Place of Service:  SNF (31)  PCP: Karie Schwalbe, MD Patient Care Team: Karie Schwalbe, MD as PCP - General Kieth Brightly, MD as Consulting Physician (General Surgery) Karie Schwalbe, MD as Referring Physician (Internal Medicine)  Extended Emergency Contact Information Primary Emergency Contact: Haworth,[name] Address: [address]          [place], [place] [address] [place] Phone: [phone] Relation: Spouse Secondary Emergency Contact: Riddle,Connie  [place] Home Phone: [phone] Relation: Daughter  Code Status: Full Code Goals of Care: Advanced Directive information    02/15/2023   10:17 AM  Advanced Directives  Does Patient Have a Medical Advance Directive? Yes  Type of Estate agent of [place];Living will  Does patient want to make changes to medical advance directive? No - Patient declined  Copy of Healthcare Power of Attorney in Chart? Yes - validated most recent copy scanned in chart (See row information)      Chief Complaint  Patient presents with   New Admit To SNF    Admission.     HPI: Patient is a 82 y.o. male seen today for admission to [facility] After an admission for pneumonia. He has been weak. Feeling closer to his normal mental status. Oriented to self, place, time, and situtation.   Denies chest pain or shortness of breath.   He lives at home with his wife. At home he doesn't do much other than watch television. His wife plays bridge. Doesn't drive. He doesn't cook anymore for the last 2 years. His wife helps with managing his medications. He takes his medications whole. They are divided in a pill pack for him. No falls in the last year. He uses the walker at home.   Would like me to discuss medications with his wife if possible.   He is eating well.  Hasn't had good bowel movements in th elast week.  Spoke with his wife who helps with managing his medications. She has concerns about the medications as well and is open to a trial of discontinuation. She states he has been on ritalin for depression and this has since improved.   Patient with a sock-like rash on his feet. Unknown amount of time never treated.    Past Medical History:  Diagnosis Date   Anemia    Anxiety    Dementia (HCC)    Depression    recurrent and severe   Diabetes mellitus    Type II with nephropathy   GI bleed    HLD (hyperlipidemia)    Hypertension    Hypothyroidism    Internal hemorrhoid    Osteoarthritis    Personal history of colonic adenoma 12/03/2012   Past Surgical History:  Procedure Laterality Date   COLONOSCOPY     COLONOSCOPY WITH PROPOFOL N/A 12/20/2016   Procedure: COLONOSCOPY WITH PROPOFOL;  Surgeon: Wyline Mood, MD;  Location: [facility] ENDOSCOPY;  Service: Endoscopy;  Laterality: N/A;   ESOPHAGOGASTRODUODENOSCOPY (EGD) WITH PROPOFOL N/A 12/17/2016   Procedure: ESOPHAGOGASTRODUODENOSCOPY (EGD) WITH PROPOFOL;  Surgeon: Charlott Rakes, MD;  Location: [facility] ENDOSCOPY;  Service: Endoscopy;  Laterality: N/A;   FLEXIBLE SIGMOIDOSCOPY N/A 12/17/2016   Procedure: FLEXIBLE SIGMOIDOSCOPY;  Surgeon: Charlott Rakes, MD;  Location: [facility] ENDOSCOPY;  Service: Endoscopy;  Laterality: N/A;   HEMORRHOID BANDING  04/2017   HEMORRHOID SURGERY N/A 06/14/2017   Procedure: HEMORRHOIDECTOMY;  Surgeon: Kieth Brightly, MD;  Location: [facility] ORS;  Service: General;  Laterality: N/A;   HERNIA REPAIR     JOINT REPLACEMENT Bilateral    PROCTOSCOPY N/A 06/14/2017   Procedure: PROCTOSCOPY;  Surgeon: Kieth Brightly, MD;  Location: [facility] ORS;  Service: General;  Laterality: N/A;   TOTAL KNEE ARTHROPLASTY  11/09   Right-Dr. Remer Macho ([facility])   TOTAL KNEE ARTHROPLASTY  6/10   Left-Dr. Remer Macho ([facility])   UMBILICAL HERNIA REPAIR  2000    reports that he has never smoked. He  has been exposed to tobacco smoke. He quit smokeless tobacco use about 20 years ago.  His smokeless tobacco use included chew. He reports that he does not drink alcohol and does not use drugs. Social History   Socioeconomic History   Marital status: Married    Spouse name: [name]   Number of children: 2   Years of education: Not on file   Highest education level: Not on file  Occupational History   Occupation: Retired  Tobacco Use   Smoking status: Never    Passive exposure: Past   Smokeless tobacco: Former    Types: Chew    Quit date: 05/15/2002  Vaping Use   Vaping Use: Never used  Substance and Sexual Activity   Alcohol use: No    Alcohol/week: 0.0 standard drinks of alcohol   Drug use: No   Sexual activity: Not Currently  Other Topics Concern   Not on file  Social History Narrative   Has living will.   Wife is formal health care POA.  Daughter [name] is alternate   Would accept resuscitation attempts   No tube feeds if cognitively unaware   Social Determinants of Health   Financial Resource Strain: Not on file  Food Insecurity: No Food Insecurity (02/07/2023)   Hunger Vital Sign    Worried About Running Out of Food in the Last Year: Never true    Ran Out of Food in the Last Year: Never true  Transportation Needs: No Transportation Needs (02/07/2023)   PRAPARE - Administrator, Civil Service (Medical): No    Lack of Transportation (Non-Medical): No  Physical Activity: Not on file  Stress: Not on file  Social Connections: Not on file  Intimate Partner Violence: Not At Risk (02/07/2023)   Humiliation, Afraid, Rape, and Kick questionnaire    Fear of Current or Ex-Partner: No    Emotionally Abused: No    Physically Abused: No    Sexually Abused: No    Functional Status Survey:    Family History  Problem Relation Age of Onset   Heart failure Father    Diabetes Father    Depression Father    Coronary artery disease Father    Breast cancer Sister 68    Lung cancer Sister    Depression Sister    Stomach cancer Mother 34   Stroke Other        Paternal side   Parkinsonism Other        Cousin   Colon cancer Neg Hx    Rectal cancer Neg Hx    Esophageal cancer Neg Hx    Colon polyps Neg Hx     Health Maintenance  Topic Date Due   Zoster Vaccines- Shingrix (1 of 2) Never done   DTaP/Tdap/Td (2 - Tdap) 07/13/2014   OPHTHALMOLOGY EXAM  02/03/2019   COVID-19 Vaccine (4 - 2023-24 season) 04/29/2022   FOOT EXAM  12/09/2022   Medicare Annual Wellness (  AWV)  12/09/2022   Diabetic kidney evaluation - Urine ACR  12/10/2022   INFLUENZA VACCINE  03/30/2023   HEMOGLOBIN A1C  06/15/2023   Diabetic kidney evaluation - eGFR measurement  02/12/2024   Pneumonia Vaccine 2+ Years old  Completed   HPV VACCINES  Aged Out    Allergies  Allergen Reactions   Vytorin [Ezetimibe-Simvastatin] Nausea Only    patient nor wife does not remember    Outpatient Encounter Medications as of 02/15/2023  Medication Sig   acetaminophen (TYLENOL) 650 MG CR tablet Take 650 mg by mouth every 8 (eight) hours as needed for pain.    atorvastatin (LIPITOR) 20 MG tablet TAKE ONE TABLET BY MOUTH ONCE DAILY   empagliflozin (JARDIANCE) 25 MG TABS tablet TAKE 1 TABLET BY MOUTH ONCE A DAY BEFOREBREAKFAST   escitalopram (LEXAPRO) 20 MG tablet TAKE 2 TABLETS BY MOUTH EVERY MORNING   gabapentin (NEURONTIN) 300 MG capsule Take 900 mg by mouth at bedtime.   glipiZIDE (GLUCOTROL) 5 MG tablet TAKE 1 TABLET BY MOUTH TWICE (2) DAILY BEFORE A MEAL   guaiFENesin (MUCINEX) 600 MG 12 hr tablet Take 1 tablet (600 mg total) by mouth 2 (two) times daily as needed.   lactulose (CHRONULAC) 10 GM/15ML solution Take 30 mLs by mouth daily as needed for mild constipation.   levothyroxine (SYNTHROID) 75 MCG tablet TAKE ONE TABLET BY MOUTH ONCE DAILY   metFORMIN (GLUCOPHAGE) 500 MG tablet TAKE 1 TABLET BY MOUTH 3 TIMES DAILY   Multiple Vitamin (MULTIVITAMIN WITH MINERALS) TABS tablet Take 1 tablet  by mouth daily.   OLANZapine (ZYPREXA) 5 MG tablet TAKE ONE TABLET BY MOUTH AT BEDTIME   polyethylene glycol (MIRALAX / GLYCOLAX) 17 g packet Take 17 g by mouth daily.   senna-docusate (SENOKOT-S) 8.6-50 MG tablet Take 1 tablet by mouth 2 (two) times daily.   tamsulosin (FLOMAX) 0.4 MG CAPS capsule TAKE ONE CAPSULE BY MOUTH DAILY   vitamin B-12 (CYANOCOBALAMIN) 1000 MCG tablet Take 1,000 mcg by mouth daily.   Zinc Oxide (TRIPLE PASTE) 12.8 % ointment Apply 1 Application topically. Every shift   feeding supplement (ENSURE ENLIVE / ENSURE PLUS) LIQD Take 237 mLs by mouth 3 (three) times daily between meals.   [DISCONTINUED] gabapentin (NEURONTIN) 300 MG capsule TAKE 3 TO 4 CAPSULES (210 254 1615 MG TOTAL)BY MOUTH AT BEDTIME (Patient taking differently: Take 300 mg by mouth at bedtime.)   [DISCONTINUED] lactulose (CHRONULAC) 10 GM/15ML solution Take 30 mLs (20 g total) by mouth 2 (two) times daily as needed for mild constipation or moderate constipation. (Patient taking differently: Take 20 g by mouth daily as needed for mild constipation or moderate constipation.)   [DISCONTINUED] methylphenidate (RITALIN) 5 MG tablet TAKE 1 TABLET BY MOUTH TWICE A DAY   [DISCONTINUED] pregabalin (LYRICA) 25 MG capsule TAKE ONE CAPSULE BY MOUTH TWICE DAILY   No facility-administered encounter medications on file as of 02/15/2023.    Review of Systems  Vitals:   02/15/23 1007  BP: 113/73  Pulse: 80  Resp: 18  Temp: 98 F (36.7 C)  SpO2: 92%  Weight: 191 lb 12.8 oz (87 kg)  Height: 5\' 8"  (1.727 m)   Body mass index is 29.16 kg/m. Physical Exam HENT:     Mouth/Throat:     Comments: Poor dentition Cardiovascular:     Rate and Rhythm: Normal rate and regular rhythm.  Pulmonary:     Comments: Decreased lung sounds bilaterally, crackles in RLL Abdominal:     General: Abdomen is flat.  Palpations: Abdomen is soft.  Neurological:     Mental Status: He is alert and oriented to person, place, and time.     Labs reviewed: Basic Metabolic Panel: Recent Labs    02/09/23 0440 02/10/23 0530 02/12/23 0449  NA 137 139 137  K 4.0 4.1 4.3  CL 104 103 102  CO2 26 27 28   GLUCOSE 138* 148* 125*  BUN 35* 30* 29*  CREATININE 1.35* 1.25* 1.54*  CALCIUM 9.1 9.5 9.1  MG 2.2  --   --    Liver Function Tests: Recent Labs    02/07/23 1243  AST 75*  ALT 32  ALKPHOS 67  BILITOT 1.2  PROT 7.9  ALBUMIN 4.6   No results for input(s): "LIPASE", "AMYLASE" in the last 8760 hours. Recent Labs    02/12/23 0449  AMMONIA 13   CBC: Recent Labs    02/07/23 1243 02/08/23 0508 02/12/23 0449  WBC 9.2 8.3 6.3  NEUTROABS 7.2  --   --   HGB 15.9 14.0 14.5  HCT 48.6 43.7 46.0  MCV 91.2 93.6 94.5  PLT 309 248 233   Cardiac Enzymes: No results for input(s): "CKTOTAL", "CKMB", "CKMBINDEX", "TROPONINI" in the last 8760 hours. BNP: Invalid input(s): "POCBNP" Lab Results  Component Value Date   HGBA1C 8.2 (A) 12/14/2022   Lab Results  Component Value Date   TSH 5.721 (H) 02/12/2023   Lab Results  Component Value Date   VITAMINB12 >1504 (H) 12/08/2021   Lab Results  Component Value Date   FOLATE 12.2 12/19/2016   Lab Results  Component Value Date   IRON 10 (L) 12/19/2016   TIBC 345 12/19/2016   FERRITIN 8.5 (L) 05/08/2017    Imaging and Procedures obtained prior to SNF admission: DG Chest 2 View  Result Date: 02/07/2023 CLINICAL DATA:  Altered mental status and cough EXAM: CHEST - 2 VIEW COMPARISON:  None Available. FINDINGS: Elevation of the right hemidiaphragm with interposition of colon. Normal left lung volume. Left basilar patchy opacities. No pleural effusion or pneumothorax. The heart size and mediastinal contours are within normal limits. Degenerative changes of the bilateral glenohumeral joints. IMPRESSION: Left basilar patchy opacities, which may represent atelectasis, aspiration, or pneumonia. Electronically Signed   By: Agustin Cree M.D.   On: 02/07/2023 18:03   CT HEAD WO  CONTRAST  Result Date: 02/07/2023 CLINICAL DATA:  Altered mental status, nontraumatic (Ped 0-17y) EXAM: CT HEAD WITHOUT CONTRAST TECHNIQUE: Contiguous axial images were obtained from the base of the skull through the vertex without intravenous contrast. RADIATION DOSE REDUCTION: This exam was performed according to the departmental dose-optimization program which includes automated exposure control, adjustment of the mA and/or kV according to patient size and/or use of iterative reconstruction technique. COMPARISON:  None Available. FINDINGS: Brain: No evidence of acute infarction, hemorrhage, hydrocephalus, extra-axial collection or mass lesion/mass effect. There is sequela of moderate chronic microvascular ischemic change with generalized volume loss. Vascular: No hyperdense vessel or unexpected calcification. Skull: Normal. Negative for fracture or focal lesion. Sinuses/Orbits: No middle ear or mastoid effusion. Paranasal sinuses are clear. Orbits are unremarkable. Other: None. IMPRESSION: No acute intracranial abnormality. Electronically Signed   By: Lorenza Cambridge M.D.   On: 02/07/2023 13:13    Assessment/Plan 1. Acute metabolic encephalopathy Patient is back to mental baseline- alert, oriented. Continues to have slowed processing. Discussed time to improvement.   2. At risk for polypharmacy Patient is on numerous pain and anxiety medications that interact and can contribute to lower threshold  for confusion due to anticholinergic effects (lyrica, methylphenidate, lexapro, zyprexa). Lyrica and ritalin discontinued during hospitalization and will not restart at this time. Continued dose reduction of medications as tolerated.   3. Depression, major, recurrent, in partial remission (HCC) Patient has a history of recurrent depression. Stable at this time, continue to monitor for decline. Continue Lexapro 20 mg daily and zyprexa 5 mg nightly.   4. Stage 3a chronic kidney disease (HCC) Stable, f/u  BMP  5. Type 2 diabetes mellitus with neurological manifestations, controlled (HCC) DM well-controlled on current regimen. Continue glipizide and metformin. Continue to monitor for hypoglycemia while on glipizide. Continued pain, continue gabapentin nightly. Continue jardiance. Continue lipitor.   6. Peripheral vascular disease, unspecified (HCC) Continue lipitor.   7. Chronic constipation Miralax and senna daily, deescalate for loose stools.   8. Hypothyroidism Continue levothyroxine 75 mcg daily.   9. Tinea pedis Fluconazole 150 mg x3 days then clotrimazole PRN.   Family/ staff Communication: Spouse, nursing  Labs/tests ordered: CBC, BMP

## 2023-02-16 ENCOUNTER — Encounter: Payer: Self-pay | Admitting: Student

## 2023-02-17 ENCOUNTER — Encounter: Payer: Self-pay | Admitting: Oncology

## 2023-02-17 DIAGNOSIS — B353 Tinea pedis: Secondary | ICD-10-CM | POA: Insufficient documentation

## 2023-02-22 ENCOUNTER — Encounter: Payer: Self-pay | Admitting: Student

## 2023-02-22 NOTE — Telephone Encounter (Signed)
Message routed to Dr.Beamer.

## 2023-02-28 ENCOUNTER — Inpatient Hospital Stay: Payer: Medicare PPO | Admitting: Family Medicine

## 2023-03-01 ENCOUNTER — Encounter: Payer: Self-pay | Admitting: Internal Medicine

## 2023-03-01 DIAGNOSIS — F3341 Major depressive disorder, recurrent, in partial remission: Secondary | ICD-10-CM | POA: Diagnosis not present

## 2023-03-01 DIAGNOSIS — N1831 Chronic kidney disease, stage 3a: Secondary | ICD-10-CM | POA: Diagnosis not present

## 2023-03-01 DIAGNOSIS — E1140 Type 2 diabetes mellitus with diabetic neuropathy, unspecified: Secondary | ICD-10-CM | POA: Diagnosis not present

## 2023-03-01 DIAGNOSIS — E1122 Type 2 diabetes mellitus with diabetic chronic kidney disease: Secondary | ICD-10-CM | POA: Diagnosis not present

## 2023-03-01 DIAGNOSIS — I129 Hypertensive chronic kidney disease with stage 1 through stage 4 chronic kidney disease, or unspecified chronic kidney disease: Secondary | ICD-10-CM | POA: Diagnosis not present

## 2023-03-01 DIAGNOSIS — G9341 Metabolic encephalopathy: Secondary | ICD-10-CM | POA: Diagnosis not present

## 2023-03-01 DIAGNOSIS — E1151 Type 2 diabetes mellitus with diabetic peripheral angiopathy without gangrene: Secondary | ICD-10-CM | POA: Diagnosis not present

## 2023-03-01 DIAGNOSIS — F0393 Unspecified dementia, unspecified severity, with mood disturbance: Secondary | ICD-10-CM | POA: Diagnosis not present

## 2023-03-01 DIAGNOSIS — D631 Anemia in chronic kidney disease: Secondary | ICD-10-CM | POA: Diagnosis not present

## 2023-03-01 NOTE — Telephone Encounter (Signed)
Message sent back advising wife that Dr Alphonsus Sias is out of the office until 03-06-23.

## 2023-03-03 NOTE — Telephone Encounter (Signed)
Called and spoke to pt's wife per DPR. Made appt for 03-06-23. She will r/s if she is not able to get help with him.

## 2023-03-06 ENCOUNTER — Encounter: Payer: Self-pay | Admitting: Internal Medicine

## 2023-03-06 ENCOUNTER — Telehealth: Payer: Self-pay | Admitting: Internal Medicine

## 2023-03-06 ENCOUNTER — Ambulatory Visit: Payer: Medicare PPO | Admitting: Internal Medicine

## 2023-03-06 VITALS — BP 112/74 | HR 98 | Temp 98.0°F | Ht 69.0 in

## 2023-03-06 DIAGNOSIS — Z7984 Long term (current) use of oral hypoglycemic drugs: Secondary | ICD-10-CM | POA: Diagnosis not present

## 2023-03-06 DIAGNOSIS — G20C Parkinsonism, unspecified: Secondary | ICD-10-CM | POA: Insufficient documentation

## 2023-03-06 DIAGNOSIS — G219 Secondary parkinsonism, unspecified: Secondary | ICD-10-CM

## 2023-03-06 DIAGNOSIS — F3341 Major depressive disorder, recurrent, in partial remission: Secondary | ICD-10-CM

## 2023-03-06 DIAGNOSIS — J181 Lobar pneumonia, unspecified organism: Secondary | ICD-10-CM | POA: Diagnosis not present

## 2023-03-06 DIAGNOSIS — E1149 Type 2 diabetes mellitus with other diabetic neurological complication: Secondary | ICD-10-CM

## 2023-03-06 DIAGNOSIS — G214 Vascular parkinsonism: Secondary | ICD-10-CM | POA: Insufficient documentation

## 2023-03-06 DIAGNOSIS — E441 Mild protein-calorie malnutrition: Secondary | ICD-10-CM

## 2023-03-06 LAB — POCT GLYCOSYLATED HEMOGLOBIN (HGB A1C): Hemoglobin A1C: 7.1 % — AB (ref 4.0–5.6)

## 2023-03-06 MED ORDER — POLYETHYLENE GLYCOL 3350 17 G PO PACK: 17.0000 g | PACK | Freq: Every day | ORAL | 0 refills | Status: DC | PRN

## 2023-03-06 MED ORDER — ESCITALOPRAM OXALATE 20 MG PO TABS: 20.0000 mg | ORAL_TABLET | Freq: Every day | ORAL | 0 refills | Status: AC

## 2023-03-06 NOTE — Telephone Encounter (Signed)
Home Health verbal orders Caller Name: Clayton Lefort Agency Name: [facility]  [place] number:  [phone]  Requesting Verbal PT, social worker, home health aide  Reason: weak, gait abnormaility, patient wants to walk better  Frequency: 1 week 1, 2 week 4, 1 week 4  Home health aide assist with bathing and social worker to assist with community resources and care giver assistance (needs more care from wife and wife is overwhelmed)  Recommends referral for a podiatrist (all toes have fungus that no one is treating (left foot only)  Heart doctor recommended also worried about swelling for possible cardiac issue  Also reporting that referral notes gave a list of medications that patient states he is not taking:  feeding supplement (ENSURE ENLIVE / ENSURE PLUS) LIQD  Azithronyzim  500 mg Cefuroxine   500 mg lactulose (CHRONULAC) 10 GM/15ML solution  Multiple Vitamin (MULTIVITAMIN WITH MINERALS) TABS tablet   All took while in rehab, but not taking at home    Please forward to [facility] pool or providers CMA

## 2023-03-06 NOTE — Telephone Encounter (Signed)
Verbal orders given to Lemuel 

## 2023-03-06 NOTE — Assessment & Plan Note (Signed)
He is no worse off the ritalin Will try off the olanzapine--unclear if helping Continue the lexapro but reduce dose to 20mg  daily

## 2023-03-06 NOTE — Assessment & Plan Note (Signed)
Lab Results  Component Value Date   HGBA1C 7.1 (A) 03/06/2023   Much better Will keep him off the glipizide and metformin Will continue the jardiance 25mg 

## 2023-03-06 NOTE — Assessment & Plan Note (Signed)
He has had long standing bradykinesia--that I have related to his depression Could have side effects with olanzapine Will stop the olanzapine Recheck 2 weeks Consider referral to Dr Tat if ongoing issues

## 2023-03-06 NOTE — Assessment & Plan Note (Signed)
Treated in hospital and then rehab Still with DOE but no symptoms of infection

## 2023-03-06 NOTE — Progress Notes (Signed)
Subjective:    Patient ID: Randy Mcdaniel., male    DOB: 10-04-1940, 82 y.o.   MRN: [id number]  HPI Here for hospital and rehab follow up  With wife and daughter  Hospitalized for apparent pneumonia about a month ago Wife concerned it was due to his staying in bed Sent to [facility] to rehab due to weakness  Stopped lyrica and ritalin Was not sent home on glipizide and metformin ---not sure he wasn't getting it there (admission note states to continue it) Has CGM----150's typical (but not sure what it generally is)  Continues to have concerns about his bowels  Breathing is not great DOE just going to the bathroom  Therapist was concerned about Parkinson's No tremor he notes--family thinks he does shake at times Doesn't write well--long standing Walking is now even slower---feet now barely shuffling  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      atorvastatin (LIPITOR) 20 MG tablet TAKE ONE TABLET BY MOUTH ONCE DAILY 90 tablet 3   escitalopram (LEXAPRO) 20 MG tablet TAKE 2 TABLETS BY MOUTH EVERY MORNING 180 tablet 3   gabapentin (NEURONTIN) 300 MG capsule Take 900 mg by mouth at bedtime.     guaiFENesin (MUCINEX) 600 MG 12 hr tablet Take 1 tablet (600 mg total) by mouth 2 (two) times daily as needed.     lactulose (CHRONULAC) 10 GM/15ML solution Take 30 mLs by mouth daily as needed for mild constipation.     levothyroxine (SYNTHROID) 75 MCG tablet TAKE ONE TABLET BY MOUTH ONCE DAILY 90 tablet 3   OLANZapine (ZYPREXA) 5 MG tablet TAKE ONE TABLET BY MOUTH AT BEDTIME 90 tablet 3   vitamin B-12 (CYANOCOBALAMIN) 1000 MCG tablet Take 1,000 mcg by mouth daily.     Zinc Oxide (TRIPLE PASTE) 12.8 % ointment Apply 1 Application topically. Every shift     empagliflozin  (JARDIANCE) 25 MG TABS tablet TAKE 1 TABLET BY MOUTH ONCE A DAY BEFOREBREAKFAST (Patient not taking: Reported on 03/06/2023) 100 tablet 3   feeding supplement (ENSURE ENLIVE / ENSURE PLUS) LIQD Take 237 mLs by mouth 3 (three) times daily between meals. (Patient not taking: Reported on 03/06/2023) 237 mL 12   glipiZIDE (GLUCOTROL) 5 MG tablet TAKE 1 TABLET BY MOUTH TWICE (2) DAILY BEFORE A MEAL (Patient not taking: Reported on 03/06/2023) 180 tablet 3   metFORMIN (GLUCOPHAGE) 500 MG tablet TAKE 1 TABLET BY MOUTH 3 TIMES DAILY (Patient not taking: Reported on 03/06/2023) 270 tablet 3   polyethylene glycol (MIRALAX / GLYCOLAX) 17 g packet Take 17 g by mouth daily. (Patient not taking: Reported on 03/06/2023) 14 each 0   senna-docusate (SENOKOT-S) 8.6-50 MG tablet Take 1 tablet by mouth 2 (two) times daily. (Patient not taking: Reported on 03/06/2023)     tamsulosin (FLOMAX) 0.4 MG CAPS capsule TAKE ONE CAPSULE BY MOUTH DAILY (Patient not taking: Reported on 03/06/2023) 90 capsule 3   No current facility-administered medications on file prior to visit.    Allergies  Allergen Reactions   Vytorin [Ezetimibe-Simvastatin] Nausea Only    patient nor wife does not remember    Past Medical History:  Diagnosis Date   Anemia    Anxiety    Dementia (HCC)    Depression    recurrent  and severe   Diabetes mellitus    Type II with nephropathy   GI bleed    HLD (hyperlipidemia)    Hypertension    Hypothyroidism    Internal hemorrhoid    Osteoarthritis    Personal history of colonic adenoma 12/03/2012    Past Surgical History:  Procedure Laterality Date   COLONOSCOPY     COLONOSCOPY WITH PROPOFOL N/A 12/20/2016   Procedure: COLONOSCOPY WITH PROPOFOL;  Surgeon: Wyline Mood, MD;  Location: [facility] ENDOSCOPY;  Service: Endoscopy;  Laterality: N/A;   ESOPHAGOGASTRODUODENOSCOPY (EGD) WITH PROPOFOL N/A 12/17/2016   Procedure: ESOPHAGOGASTRODUODENOSCOPY (EGD) WITH PROPOFOL;  Surgeon: Charlott Rakes, MD;  Location: [facility]  ENDOSCOPY;  Service: Endoscopy;  Laterality: N/A;   FLEXIBLE SIGMOIDOSCOPY N/A 12/17/2016   Procedure: FLEXIBLE SIGMOIDOSCOPY;  Surgeon: Charlott Rakes, MD;  Location: [facility] ENDOSCOPY;  Service: Endoscopy;  Laterality: N/A;   HEMORRHOID BANDING  04/2017   HEMORRHOID SURGERY N/A 06/14/2017   Procedure: HEMORRHOIDECTOMY;  Surgeon: Kieth Brightly, MD;  Location: [facility] ORS;  Service: General;  Laterality: N/A;   HERNIA REPAIR     JOINT REPLACEMENT Bilateral    PROCTOSCOPY N/A 06/14/2017   Procedure: PROCTOSCOPY;  Surgeon: Kieth Brightly, MD;  Location: [facility] ORS;  Service: General;  Laterality: N/A;   TOTAL KNEE ARTHROPLASTY  11/09   Right-Dr. Remer Macho ([facility])   TOTAL KNEE ARTHROPLASTY  6/10   Left-Dr. Remer Macho ([facility])   UMBILICAL HERNIA REPAIR  2000    Family History  Problem Relation Age of Onset   Heart failure Father    Diabetes Father    Depression Father    Coronary artery disease Father    Breast cancer Sister 80   Lung cancer Sister    Depression Sister    Stomach cancer Mother 45   Stroke Other        Paternal side   Parkinsonism Other        Cousin   Colon cancer Neg Hx    Rectal cancer Neg Hx    Esophageal cancer Neg Hx    Colon polyps Neg Hx     Social History   Socioeconomic History   Marital status: Married    Spouse name: [name]   Number of children: 2   Years of education: Not on file   Highest education level: Not on file  Occupational History   Occupation: Retired  Tobacco Use   Smoking status: Never    Passive exposure: Past   Smokeless tobacco: Former    Types: Chew    Quit date: 05/15/2002  Vaping Use   Vaping Use: Never used  Substance and Sexual Activity   Alcohol use: No    Alcohol/week: 0.0 standard drinks of alcohol   Drug use: No   Sexual activity: Not Currently  Other Topics Concern   Not on file  Social History Narrative   Has living will.   Wife is formal health care POA.  Daughter [name] is alternate   Would accept  resuscitation attempts   No tube feeds if cognitively unaware   Social Determinants of Health   Financial Resource Strain: Not on file  Food Insecurity: No Food Insecurity (02/07/2023)   Hunger Vital Sign    Worried About Running Out of Food in the Last Year: Never true    Ran Out of Food in the Last Year: Never true  Transportation Needs: No Transportation Needs (02/07/2023)   PRAPARE - Transportation    Lack of Transportation (Medical): No    Lack  of Transportation (Non-Medical): No  Physical Activity: Not on file  Stress: Not on file  Social Connections: Not on file  Intimate Partner Violence: Not At Risk (02/07/2023)   Humiliation, Afraid, Rape, and Kick questionnaire    Fear of Current or Ex-Partner: No    Emotionally Abused: No    Physically Abused: No    Sexually Abused: No   Review of Systems Not eating well Has lost about 20# since he was sick     Objective:   Physical Exam Constitutional:      Comments: Some wasting  Cardiovascular:     Rate and Rhythm: Normal rate and regular rhythm.     Heart sounds: No murmur heard.    No gallop.  Pulmonary:     Effort: Pulmonary effort is normal.     Breath sounds: Normal breath sounds. No wheezing or rales.  Neurological:     Mental Status: He is alert.     Comments: Fairly normal tone Minimal tremor in fingers Not able to walk here (doesn't have his walker)  Psychiatric:     Comments: Still flat but typical for him            Assessment & Plan:

## 2023-03-06 NOTE — Assessment & Plan Note (Signed)
Has lost about 20# Eating some better back home

## 2023-03-07 DIAGNOSIS — E1140 Type 2 diabetes mellitus with diabetic neuropathy, unspecified: Secondary | ICD-10-CM | POA: Diagnosis not present

## 2023-03-07 DIAGNOSIS — E1151 Type 2 diabetes mellitus with diabetic peripheral angiopathy without gangrene: Secondary | ICD-10-CM | POA: Diagnosis not present

## 2023-03-07 DIAGNOSIS — D631 Anemia in chronic kidney disease: Secondary | ICD-10-CM | POA: Diagnosis not present

## 2023-03-07 DIAGNOSIS — G9341 Metabolic encephalopathy: Secondary | ICD-10-CM | POA: Diagnosis not present

## 2023-03-07 DIAGNOSIS — F3341 Major depressive disorder, recurrent, in partial remission: Secondary | ICD-10-CM | POA: Diagnosis not present

## 2023-03-07 DIAGNOSIS — N1831 Chronic kidney disease, stage 3a: Secondary | ICD-10-CM | POA: Diagnosis not present

## 2023-03-07 DIAGNOSIS — I129 Hypertensive chronic kidney disease with stage 1 through stage 4 chronic kidney disease, or unspecified chronic kidney disease: Secondary | ICD-10-CM | POA: Diagnosis not present

## 2023-03-07 DIAGNOSIS — E1122 Type 2 diabetes mellitus with diabetic chronic kidney disease: Secondary | ICD-10-CM | POA: Diagnosis not present

## 2023-03-07 DIAGNOSIS — F0393 Unspecified dementia, unspecified severity, with mood disturbance: Secondary | ICD-10-CM | POA: Diagnosis not present

## 2023-03-10 DIAGNOSIS — D631 Anemia in chronic kidney disease: Secondary | ICD-10-CM | POA: Diagnosis not present

## 2023-03-10 DIAGNOSIS — F0393 Unspecified dementia, unspecified severity, with mood disturbance: Secondary | ICD-10-CM | POA: Diagnosis not present

## 2023-03-10 DIAGNOSIS — E1151 Type 2 diabetes mellitus with diabetic peripheral angiopathy without gangrene: Secondary | ICD-10-CM | POA: Diagnosis not present

## 2023-03-10 DIAGNOSIS — G9341 Metabolic encephalopathy: Secondary | ICD-10-CM | POA: Diagnosis not present

## 2023-03-10 DIAGNOSIS — E1140 Type 2 diabetes mellitus with diabetic neuropathy, unspecified: Secondary | ICD-10-CM | POA: Diagnosis not present

## 2023-03-10 DIAGNOSIS — F3341 Major depressive disorder, recurrent, in partial remission: Secondary | ICD-10-CM | POA: Diagnosis not present

## 2023-03-10 DIAGNOSIS — N1831 Chronic kidney disease, stage 3a: Secondary | ICD-10-CM | POA: Diagnosis not present

## 2023-03-10 DIAGNOSIS — E1122 Type 2 diabetes mellitus with diabetic chronic kidney disease: Secondary | ICD-10-CM | POA: Diagnosis not present

## 2023-03-10 DIAGNOSIS — I129 Hypertensive chronic kidney disease with stage 1 through stage 4 chronic kidney disease, or unspecified chronic kidney disease: Secondary | ICD-10-CM | POA: Diagnosis not present

## 2023-03-13 DIAGNOSIS — Z86018 Personal history of other benign neoplasm: Secondary | ICD-10-CM

## 2023-03-13 DIAGNOSIS — F0394 Unspecified dementia, unspecified severity, with anxiety: Secondary | ICD-10-CM

## 2023-03-13 DIAGNOSIS — E785 Hyperlipidemia, unspecified: Secondary | ICD-10-CM

## 2023-03-13 DIAGNOSIS — N1831 Chronic kidney disease, stage 3a: Secondary | ICD-10-CM | POA: Diagnosis not present

## 2023-03-13 DIAGNOSIS — I129 Hypertensive chronic kidney disease with stage 1 through stage 4 chronic kidney disease, or unspecified chronic kidney disease: Secondary | ICD-10-CM | POA: Diagnosis not present

## 2023-03-13 DIAGNOSIS — N179 Acute kidney failure, unspecified: Secondary | ICD-10-CM

## 2023-03-13 DIAGNOSIS — F0393 Unspecified dementia, unspecified severity, with mood disturbance: Secondary | ICD-10-CM | POA: Diagnosis not present

## 2023-03-13 DIAGNOSIS — M1990 Unspecified osteoarthritis, unspecified site: Secondary | ICD-10-CM

## 2023-03-13 DIAGNOSIS — E1122 Type 2 diabetes mellitus with diabetic chronic kidney disease: Secondary | ICD-10-CM | POA: Diagnosis not present

## 2023-03-13 DIAGNOSIS — N400 Enlarged prostate without lower urinary tract symptoms: Secondary | ICD-10-CM

## 2023-03-13 DIAGNOSIS — F3341 Major depressive disorder, recurrent, in partial remission: Secondary | ICD-10-CM | POA: Diagnosis not present

## 2023-03-13 DIAGNOSIS — Z87891 Personal history of nicotine dependence: Secondary | ICD-10-CM

## 2023-03-13 DIAGNOSIS — Z9181 History of falling: Secondary | ICD-10-CM

## 2023-03-13 DIAGNOSIS — E1140 Type 2 diabetes mellitus with diabetic neuropathy, unspecified: Secondary | ICD-10-CM | POA: Diagnosis not present

## 2023-03-13 DIAGNOSIS — E039 Hypothyroidism, unspecified: Secondary | ICD-10-CM

## 2023-03-13 DIAGNOSIS — D631 Anemia in chronic kidney disease: Secondary | ICD-10-CM | POA: Diagnosis not present

## 2023-03-13 DIAGNOSIS — G9341 Metabolic encephalopathy: Secondary | ICD-10-CM | POA: Diagnosis not present

## 2023-03-13 DIAGNOSIS — Z7984 Long term (current) use of oral hypoglycemic drugs: Secondary | ICD-10-CM

## 2023-03-13 DIAGNOSIS — Z8701 Personal history of pneumonia (recurrent): Secondary | ICD-10-CM

## 2023-03-13 DIAGNOSIS — Z96653 Presence of artificial knee joint, bilateral: Secondary | ICD-10-CM

## 2023-03-13 DIAGNOSIS — E1151 Type 2 diabetes mellitus with diabetic peripheral angiopathy without gangrene: Secondary | ICD-10-CM | POA: Diagnosis not present

## 2023-03-14 DIAGNOSIS — E1151 Type 2 diabetes mellitus with diabetic peripheral angiopathy without gangrene: Secondary | ICD-10-CM | POA: Diagnosis not present

## 2023-03-14 DIAGNOSIS — G9341 Metabolic encephalopathy: Secondary | ICD-10-CM | POA: Diagnosis not present

## 2023-03-14 DIAGNOSIS — D631 Anemia in chronic kidney disease: Secondary | ICD-10-CM | POA: Diagnosis not present

## 2023-03-14 DIAGNOSIS — I129 Hypertensive chronic kidney disease with stage 1 through stage 4 chronic kidney disease, or unspecified chronic kidney disease: Secondary | ICD-10-CM | POA: Diagnosis not present

## 2023-03-14 DIAGNOSIS — E1140 Type 2 diabetes mellitus with diabetic neuropathy, unspecified: Secondary | ICD-10-CM | POA: Diagnosis not present

## 2023-03-14 DIAGNOSIS — N1831 Chronic kidney disease, stage 3a: Secondary | ICD-10-CM | POA: Diagnosis not present

## 2023-03-14 DIAGNOSIS — E1122 Type 2 diabetes mellitus with diabetic chronic kidney disease: Secondary | ICD-10-CM | POA: Diagnosis not present

## 2023-03-14 DIAGNOSIS — F0393 Unspecified dementia, unspecified severity, with mood disturbance: Secondary | ICD-10-CM | POA: Diagnosis not present

## 2023-03-14 DIAGNOSIS — F3341 Major depressive disorder, recurrent, in partial remission: Secondary | ICD-10-CM | POA: Diagnosis not present

## 2023-03-20 DIAGNOSIS — E1151 Type 2 diabetes mellitus with diabetic peripheral angiopathy without gangrene: Secondary | ICD-10-CM | POA: Diagnosis not present

## 2023-03-20 DIAGNOSIS — E1122 Type 2 diabetes mellitus with diabetic chronic kidney disease: Secondary | ICD-10-CM | POA: Diagnosis not present

## 2023-03-20 DIAGNOSIS — E1140 Type 2 diabetes mellitus with diabetic neuropathy, unspecified: Secondary | ICD-10-CM | POA: Diagnosis not present

## 2023-03-20 DIAGNOSIS — F3341 Major depressive disorder, recurrent, in partial remission: Secondary | ICD-10-CM | POA: Diagnosis not present

## 2023-03-20 DIAGNOSIS — F0393 Unspecified dementia, unspecified severity, with mood disturbance: Secondary | ICD-10-CM | POA: Diagnosis not present

## 2023-03-20 DIAGNOSIS — D631 Anemia in chronic kidney disease: Secondary | ICD-10-CM | POA: Diagnosis not present

## 2023-03-20 DIAGNOSIS — G9341 Metabolic encephalopathy: Secondary | ICD-10-CM | POA: Diagnosis not present

## 2023-03-20 DIAGNOSIS — N1831 Chronic kidney disease, stage 3a: Secondary | ICD-10-CM | POA: Diagnosis not present

## 2023-03-20 DIAGNOSIS — I129 Hypertensive chronic kidney disease with stage 1 through stage 4 chronic kidney disease, or unspecified chronic kidney disease: Secondary | ICD-10-CM | POA: Diagnosis not present

## 2023-03-21 ENCOUNTER — Encounter: Payer: Self-pay | Admitting: Neurology

## 2023-03-21 ENCOUNTER — Ambulatory Visit (INDEPENDENT_AMBULATORY_CARE_PROVIDER_SITE_OTHER): Payer: Medicare PPO | Admitting: Internal Medicine

## 2023-03-21 ENCOUNTER — Encounter: Payer: Self-pay | Admitting: Internal Medicine

## 2023-03-21 VITALS — BP 120/80 | HR 100 | Temp 97.6°F | Ht 69.0 in | Wt 181.0 lb

## 2023-03-21 DIAGNOSIS — N1831 Chronic kidney disease, stage 3a: Secondary | ICD-10-CM

## 2023-03-21 DIAGNOSIS — F3341 Major depressive disorder, recurrent, in partial remission: Secondary | ICD-10-CM | POA: Diagnosis not present

## 2023-03-21 DIAGNOSIS — G20C Parkinsonism, unspecified: Secondary | ICD-10-CM

## 2023-03-21 DIAGNOSIS — N401 Enlarged prostate with lower urinary tract symptoms: Secondary | ICD-10-CM

## 2023-03-21 DIAGNOSIS — E441 Mild protein-calorie malnutrition: Secondary | ICD-10-CM

## 2023-03-21 DIAGNOSIS — Z0000 Encounter for general adult medical examination without abnormal findings: Secondary | ICD-10-CM | POA: Diagnosis not present

## 2023-03-21 DIAGNOSIS — Z7984 Long term (current) use of oral hypoglycemic drugs: Secondary | ICD-10-CM | POA: Diagnosis not present

## 2023-03-21 DIAGNOSIS — I739 Peripheral vascular disease, unspecified: Secondary | ICD-10-CM | POA: Diagnosis not present

## 2023-03-21 DIAGNOSIS — E1149 Type 2 diabetes mellitus with other diabetic neurological complication: Secondary | ICD-10-CM | POA: Diagnosis not present

## 2023-03-21 LAB — HM DIABETES FOOT EXAM

## 2023-03-21 MED ORDER — TAMSULOSIN HCL 0.4 MG PO CAPS: 0.8000 mg | ORAL_CAPSULE | Freq: Every day | ORAL | 3 refills | Status: DC

## 2023-03-21 NOTE — Assessment & Plan Note (Signed)
Symptoms no better off olanzapine I suspect vascular process but will ask Dr Tat to evaluate to make sure it is not primary PD

## 2023-03-21 NOTE — Assessment & Plan Note (Signed)
Not doing well--but no worse off the zyprexa Continue the lexapro 20mg  daily

## 2023-03-21 NOTE — Addendum Note (Signed)
Addended by: Tillman Abide I on: 03/21/2023 12:25 PM   Modules accepted: Orders

## 2023-03-21 NOTE — Assessment & Plan Note (Signed)
Changes in feet but no pain or skin breakdown

## 2023-03-21 NOTE — Assessment & Plan Note (Signed)
Worse Will double the flomax

## 2023-03-21 NOTE — Assessment & Plan Note (Signed)
I have personally reviewed the Medicare Annual Wellness questionnaire and have noted 1. The patient's medical and social history 2. Their use of alcohol, tobacco or illicit drugs 3. Their current medications and supplements 4. The patient's functional ability including ADL's, fall risks, home safety risks and hearing or visual             impairment. 5. Diet and physical activities 6. Evidence for depression or mood disorders  The patients weight, height, BMI and visual acuity have been recorded in the chart I have made referrals, counseling and provided education to the patient based review of the above and I have provided the pt with a written personalized care plan for preventive services.  I have provided you with a copy of your personalized plan for preventive services. Please take the time to review along with your updated medication list.  No cancer screening Not able to exercise Due for Td booster, also consider shingrix Updated COVID/flu in the fall---also RSV

## 2023-03-21 NOTE — Assessment & Plan Note (Signed)
Sllightly worse last time Will recheck No ACEI/ARB to reduce risk of falls

## 2023-03-21 NOTE — Assessment & Plan Note (Addendum)
Still seems okay just with the jardiance Gabapentin 900mg  at bedtime Off the lyrica

## 2023-03-21 NOTE — Progress Notes (Signed)
Subjective:    Patient ID: Randy Mcdaniel., male    DOB: Aug 31, 1940, 82 y.o.   MRN: [id number]  HPI Here for Medicare wellness visit and follow up of chronic health conditions With wife and daughter [name] Reviewed advanced directives Reviewed other doctors--Dr Beamer--when in rehab, [name], Dr Cleatis Polka Only hospitalizations was the recent one for pneumonia --followed by rehab Multiple falls---no sig injuries No alcohol or tobacco No exercise Currently needs help showering (aide once a week to help), gets help dressing for now, independent with bathroom Some cognitive issues Vision is okay Hearing is not great--no sig change  Was able to walk in with walker today Still not feeling well  Sugar fluctuating--as high as 280---after sweets Lowest was 112 Still no appetite---but did eat better in the past few days (including energy shake) Wife feels his weight  is stable since last visit 2 weeks ago (still down a lot from this illness)  Off zyprexa--does feel more depressed Wife not sure---but he is sleeping a lot.  Clearly anhedonic--doesn't even want to have conversation with wife Variable with eating and interest--from day to day Daughter notes some strain between mom and dad since MIL died (she had served as buffer between them) He will even stay in bed some days and be incontinent there  Still with shuffling gait--even off zyprexa Larey Seat Saturday in bathroom Very afraid of falling more PT person came a few days ago---but limited participation  Still gabapentin at bedtime (2 hours before) Sleeps better with this  Urinary stream is very slow Is on tamsulosin  Last GFR 44  Current Outpatient Medications on File Prior to Visit  Medication Sig Dispense Refill   acetaminophen (TYLENOL) 650 MG CR tablet Take 650 mg by mouth every 8 (eight) hours as needed for pain.      empagliflozin (JARDIANCE) 25 MG TABS tablet TAKE 1 TABLET BY MOUTH ONCE A DAY  BEFOREBREAKFAST 100 tablet 3   escitalopram (LEXAPRO) 20 MG tablet Take 1 tablet (20 mg total) by mouth daily. 1 tablet 0   feeding supplement (ENSURE ENLIVE / ENSURE PLUS) LIQD Take 237 mLs by mouth 3 (three) times daily between meals. 237 mL 12   gabapentin (NEURONTIN) 300 MG capsule Take 900 mg by mouth at bedtime.     levothyroxine (SYNTHROID) 75 MCG tablet TAKE ONE TABLET BY MOUTH ONCE DAILY 90 tablet 3   polyethylene glycol (MIRALAX / GLYCOLAX) 17 g packet Take 17 g by mouth daily as needed. 14 each 0   senna-docusate (SENOKOT-S) 8.6-50 MG tablet Take 1 tablet by mouth 2 (two) times daily.     tamsulosin (FLOMAX) 0.4 MG CAPS capsule TAKE ONE CAPSULE BY MOUTH DAILY 90 capsule 3   vitamin B-12 (CYANOCOBALAMIN) 1000 MCG tablet Take 1,000 mcg by mouth daily.     Zinc Oxide (TRIPLE PASTE) 12.8 % ointment Apply 1 Application topically. Every shift     No current facility-administered medications on file prior to visit.    Allergies  Allergen Reactions   Vytorin [Ezetimibe-Simvastatin] Nausea Only    patient nor wife does not remember    Past Medical History:  Diagnosis Date   Anemia    Anxiety    Dementia (HCC)    Depression    recurrent and severe   Diabetes mellitus    Type II with nephropathy   GI bleed    HLD (hyperlipidemia)    Hypertension    Hypothyroidism    Internal hemorrhoid    Osteoarthritis  Personal history of colonic adenoma 12/03/2012    Past Surgical History:  Procedure Laterality Date   COLONOSCOPY     COLONOSCOPY WITH PROPOFOL N/A 12/20/2016   Procedure: COLONOSCOPY WITH PROPOFOL;  Surgeon: Wyline Mood, MD;  Location: [facility] ENDOSCOPY;  Service: Endoscopy;  Laterality: N/A;   ESOPHAGOGASTRODUODENOSCOPY (EGD) WITH PROPOFOL N/A 12/17/2016   Procedure: ESOPHAGOGASTRODUODENOSCOPY (EGD) WITH PROPOFOL;  Surgeon: Charlott Rakes, MD;  Location: [facility] ENDOSCOPY;  Service: Endoscopy;  Laterality: N/A;   FLEXIBLE SIGMOIDOSCOPY N/A 12/17/2016   Procedure: FLEXIBLE  SIGMOIDOSCOPY;  Surgeon: Charlott Rakes, MD;  Location: [facility] ENDOSCOPY;  Service: Endoscopy;  Laterality: N/A;   HEMORRHOID BANDING  04/2017   HEMORRHOID SURGERY N/A 06/14/2017   Procedure: HEMORRHOIDECTOMY;  Surgeon: Kieth Brightly, MD;  Location: [facility] ORS;  Service: General;  Laterality: N/A;   HERNIA REPAIR     JOINT REPLACEMENT Bilateral    PROCTOSCOPY N/A 06/14/2017   Procedure: PROCTOSCOPY;  Surgeon: Kieth Brightly, MD;  Location: [facility] ORS;  Service: General;  Laterality: N/A;   TOTAL KNEE ARTHROPLASTY  11/09   Right-Dr. Remer Macho ([facility])   TOTAL KNEE ARTHROPLASTY  6/10   Left-Dr. Remer Macho ([facility])   UMBILICAL HERNIA REPAIR  2000    Family History  Problem Relation Age of Onset   Heart failure Father    Diabetes Father    Depression Father    Coronary artery disease Father    Breast cancer Sister 64   Lung cancer Sister    Depression Sister    Stomach cancer Mother 59   Stroke Other        Paternal side   Parkinsonism Other        Cousin   Colon cancer Neg Hx    Rectal cancer Neg Hx    Esophageal cancer Neg Hx    Colon polyps Neg Hx     Social History   Socioeconomic History   Marital status: Married    Spouse name: [name]   Number of children: 2   Years of education: Not on file   Highest education level: Not on file  Occupational History   Occupation: Retired  Tobacco Use   Smoking status: Never    Passive exposure: Past   Smokeless tobacco: Former    Types: Chew    Quit date: 05/15/2002  Vaping Use   Vaping status: Never Used  Substance and Sexual Activity   Alcohol use: No    Alcohol/week: 0.0 standard drinks of alcohol   Drug use: No   Sexual activity: Not Currently  Other Topics Concern   Not on file  Social History Narrative   Has living will.   Wife is formal health care POA.  Daughter [name] is alternate   Would accept resuscitation attempts   No tube feeds if cognitively unaware   Social Determinants of Health   Financial  Resource Strain: Not on file  Food Insecurity: No Food Insecurity (02/07/2023)   Hunger Vital Sign    Worried About Running Out of Food in the Last Year: Never true    Ran Out of Food in the Last Year: Never true  Transportation Needs: No Transportation Needs (02/07/2023)   PRAPARE - Administrator, Civil Service (Medical): No    Lack of Transportation (Non-Medical): No  Physical Activity: Not on file  Stress: Not on file  Social Connections: Not on file  Intimate Partner Violence: Not At Risk (02/07/2023)   Humiliation, Afraid, Rape, and Kick questionnaire    Fear of  Current or Ex-Partner: No    Emotionally Abused: No    Physically Abused: No    Sexually Abused: No   Review of Systems No pain Still has bowel issues Wears seat belt Upper plate---no recent dental visits No suspicious skin lesions--but does have redness on calves (does have mild swelling) No chest pain or SOB No dizziness or syncope No edema now No palpitations     Objective:   Physical Exam HENT:     Mouth/Throat:     Pharynx: No oropharyngeal exudate or posterior oropharyngeal erythema.  Eyes:     Conjunctiva/sclera: Conjunctivae normal.     Pupils: Pupils are equal, round, and reactive to light.  Cardiovascular:     Rate and Rhythm: Normal rate and regular rhythm.     Heart sounds: No murmur heard.    No gallop.     Comments: No pedal pulses Pulmonary:     Effort: Pulmonary effort is normal.     Breath sounds: Normal breath sounds. No wheezing or rales.  Abdominal:     Palpations: Abdomen is soft.     Tenderness: There is no abdominal tenderness.  Musculoskeletal:     Cervical back: Neck supple.     Right lower leg: No edema.     Left lower leg: No edema.  Lymphadenopathy:     Cervical: No cervical adenopathy.  Skin:    Comments: Redness in calves/feet No foot ulcers  Neurological:     Mental Status: He is alert.     Comments: Still with bradykinesia Shuffling gait Some what  masked facies Decreased sensation in feet--mostly right Hard to check cognition  Psychiatric:     Comments: Withdrawn Limited talking to me---though not inappropriate            Assessment & Plan:

## 2023-03-21 NOTE — Assessment & Plan Note (Signed)
Seems to be eating some better Hopefully will gain back weight Is taking some boost

## 2023-03-22 ENCOUNTER — Encounter: Payer: Self-pay | Admitting: Internal Medicine

## 2023-03-22 DIAGNOSIS — E1140 Type 2 diabetes mellitus with diabetic neuropathy, unspecified: Secondary | ICD-10-CM | POA: Diagnosis not present

## 2023-03-22 DIAGNOSIS — I129 Hypertensive chronic kidney disease with stage 1 through stage 4 chronic kidney disease, or unspecified chronic kidney disease: Secondary | ICD-10-CM | POA: Diagnosis not present

## 2023-03-22 DIAGNOSIS — N1831 Chronic kidney disease, stage 3a: Secondary | ICD-10-CM | POA: Diagnosis not present

## 2023-03-22 DIAGNOSIS — E1151 Type 2 diabetes mellitus with diabetic peripheral angiopathy without gangrene: Secondary | ICD-10-CM | POA: Diagnosis not present

## 2023-03-22 DIAGNOSIS — D631 Anemia in chronic kidney disease: Secondary | ICD-10-CM | POA: Diagnosis not present

## 2023-03-22 DIAGNOSIS — F3341 Major depressive disorder, recurrent, in partial remission: Secondary | ICD-10-CM | POA: Diagnosis not present

## 2023-03-22 DIAGNOSIS — G9341 Metabolic encephalopathy: Secondary | ICD-10-CM | POA: Diagnosis not present

## 2023-03-22 DIAGNOSIS — F0393 Unspecified dementia, unspecified severity, with mood disturbance: Secondary | ICD-10-CM | POA: Diagnosis not present

## 2023-03-22 DIAGNOSIS — E1122 Type 2 diabetes mellitus with diabetic chronic kidney disease: Secondary | ICD-10-CM | POA: Diagnosis not present

## 2023-03-24 DIAGNOSIS — D631 Anemia in chronic kidney disease: Secondary | ICD-10-CM | POA: Diagnosis not present

## 2023-03-24 DIAGNOSIS — E1140 Type 2 diabetes mellitus with diabetic neuropathy, unspecified: Secondary | ICD-10-CM | POA: Diagnosis not present

## 2023-03-24 DIAGNOSIS — G9341 Metabolic encephalopathy: Secondary | ICD-10-CM | POA: Diagnosis not present

## 2023-03-24 DIAGNOSIS — E1151 Type 2 diabetes mellitus with diabetic peripheral angiopathy without gangrene: Secondary | ICD-10-CM | POA: Diagnosis not present

## 2023-03-24 DIAGNOSIS — F3341 Major depressive disorder, recurrent, in partial remission: Secondary | ICD-10-CM | POA: Diagnosis not present

## 2023-03-24 DIAGNOSIS — F0393 Unspecified dementia, unspecified severity, with mood disturbance: Secondary | ICD-10-CM | POA: Diagnosis not present

## 2023-03-24 DIAGNOSIS — I129 Hypertensive chronic kidney disease with stage 1 through stage 4 chronic kidney disease, or unspecified chronic kidney disease: Secondary | ICD-10-CM | POA: Diagnosis not present

## 2023-03-24 DIAGNOSIS — E1122 Type 2 diabetes mellitus with diabetic chronic kidney disease: Secondary | ICD-10-CM | POA: Diagnosis not present

## 2023-03-24 DIAGNOSIS — N1831 Chronic kidney disease, stage 3a: Secondary | ICD-10-CM | POA: Diagnosis not present

## 2023-03-27 DIAGNOSIS — F0393 Unspecified dementia, unspecified severity, with mood disturbance: Secondary | ICD-10-CM | POA: Diagnosis not present

## 2023-03-27 DIAGNOSIS — D631 Anemia in chronic kidney disease: Secondary | ICD-10-CM | POA: Diagnosis not present

## 2023-03-27 DIAGNOSIS — E1122 Type 2 diabetes mellitus with diabetic chronic kidney disease: Secondary | ICD-10-CM | POA: Diagnosis not present

## 2023-03-27 DIAGNOSIS — E1151 Type 2 diabetes mellitus with diabetic peripheral angiopathy without gangrene: Secondary | ICD-10-CM | POA: Diagnosis not present

## 2023-03-27 DIAGNOSIS — F3341 Major depressive disorder, recurrent, in partial remission: Secondary | ICD-10-CM | POA: Diagnosis not present

## 2023-03-27 DIAGNOSIS — I129 Hypertensive chronic kidney disease with stage 1 through stage 4 chronic kidney disease, or unspecified chronic kidney disease: Secondary | ICD-10-CM | POA: Diagnosis not present

## 2023-03-27 DIAGNOSIS — N1831 Chronic kidney disease, stage 3a: Secondary | ICD-10-CM | POA: Diagnosis not present

## 2023-03-27 DIAGNOSIS — E1140 Type 2 diabetes mellitus with diabetic neuropathy, unspecified: Secondary | ICD-10-CM | POA: Diagnosis not present

## 2023-03-27 DIAGNOSIS — G9341 Metabolic encephalopathy: Secondary | ICD-10-CM | POA: Diagnosis not present

## 2023-03-28 DIAGNOSIS — G9341 Metabolic encephalopathy: Secondary | ICD-10-CM | POA: Diagnosis not present

## 2023-03-28 DIAGNOSIS — F0393 Unspecified dementia, unspecified severity, with mood disturbance: Secondary | ICD-10-CM | POA: Diagnosis not present

## 2023-03-28 DIAGNOSIS — E1151 Type 2 diabetes mellitus with diabetic peripheral angiopathy without gangrene: Secondary | ICD-10-CM | POA: Diagnosis not present

## 2023-03-28 DIAGNOSIS — I129 Hypertensive chronic kidney disease with stage 1 through stage 4 chronic kidney disease, or unspecified chronic kidney disease: Secondary | ICD-10-CM | POA: Diagnosis not present

## 2023-03-28 DIAGNOSIS — D631 Anemia in chronic kidney disease: Secondary | ICD-10-CM | POA: Diagnosis not present

## 2023-03-28 DIAGNOSIS — E1140 Type 2 diabetes mellitus with diabetic neuropathy, unspecified: Secondary | ICD-10-CM | POA: Diagnosis not present

## 2023-03-28 DIAGNOSIS — F3341 Major depressive disorder, recurrent, in partial remission: Secondary | ICD-10-CM | POA: Diagnosis not present

## 2023-03-28 DIAGNOSIS — E1122 Type 2 diabetes mellitus with diabetic chronic kidney disease: Secondary | ICD-10-CM | POA: Diagnosis not present

## 2023-03-28 DIAGNOSIS — N1831 Chronic kidney disease, stage 3a: Secondary | ICD-10-CM | POA: Diagnosis not present

## 2023-03-31 ENCOUNTER — Encounter: Payer: Self-pay | Admitting: Internal Medicine

## 2023-04-03 DIAGNOSIS — F3341 Major depressive disorder, recurrent, in partial remission: Secondary | ICD-10-CM | POA: Diagnosis not present

## 2023-04-03 DIAGNOSIS — E1151 Type 2 diabetes mellitus with diabetic peripheral angiopathy without gangrene: Secondary | ICD-10-CM | POA: Diagnosis not present

## 2023-04-03 DIAGNOSIS — I129 Hypertensive chronic kidney disease with stage 1 through stage 4 chronic kidney disease, or unspecified chronic kidney disease: Secondary | ICD-10-CM | POA: Diagnosis not present

## 2023-04-03 DIAGNOSIS — N1831 Chronic kidney disease, stage 3a: Secondary | ICD-10-CM | POA: Diagnosis not present

## 2023-04-03 DIAGNOSIS — E1122 Type 2 diabetes mellitus with diabetic chronic kidney disease: Secondary | ICD-10-CM | POA: Diagnosis not present

## 2023-04-03 DIAGNOSIS — D631 Anemia in chronic kidney disease: Secondary | ICD-10-CM | POA: Diagnosis not present

## 2023-04-03 DIAGNOSIS — E1140 Type 2 diabetes mellitus with diabetic neuropathy, unspecified: Secondary | ICD-10-CM | POA: Diagnosis not present

## 2023-04-03 DIAGNOSIS — F0393 Unspecified dementia, unspecified severity, with mood disturbance: Secondary | ICD-10-CM | POA: Diagnosis not present

## 2023-04-03 DIAGNOSIS — G9341 Metabolic encephalopathy: Secondary | ICD-10-CM | POA: Diagnosis not present

## 2023-04-05 DIAGNOSIS — E1140 Type 2 diabetes mellitus with diabetic neuropathy, unspecified: Secondary | ICD-10-CM | POA: Diagnosis not present

## 2023-04-05 DIAGNOSIS — E1122 Type 2 diabetes mellitus with diabetic chronic kidney disease: Secondary | ICD-10-CM | POA: Diagnosis not present

## 2023-04-05 DIAGNOSIS — I129 Hypertensive chronic kidney disease with stage 1 through stage 4 chronic kidney disease, or unspecified chronic kidney disease: Secondary | ICD-10-CM | POA: Diagnosis not present

## 2023-04-05 DIAGNOSIS — G9341 Metabolic encephalopathy: Secondary | ICD-10-CM | POA: Diagnosis not present

## 2023-04-05 DIAGNOSIS — F3341 Major depressive disorder, recurrent, in partial remission: Secondary | ICD-10-CM | POA: Diagnosis not present

## 2023-04-05 DIAGNOSIS — E1151 Type 2 diabetes mellitus with diabetic peripheral angiopathy without gangrene: Secondary | ICD-10-CM | POA: Diagnosis not present

## 2023-04-05 DIAGNOSIS — N1831 Chronic kidney disease, stage 3a: Secondary | ICD-10-CM | POA: Diagnosis not present

## 2023-04-05 DIAGNOSIS — D631 Anemia in chronic kidney disease: Secondary | ICD-10-CM | POA: Diagnosis not present

## 2023-04-05 DIAGNOSIS — F0393 Unspecified dementia, unspecified severity, with mood disturbance: Secondary | ICD-10-CM | POA: Diagnosis not present

## 2023-04-05 NOTE — Telephone Encounter (Signed)
Put form up front in yellow folder.

## 2023-04-12 ENCOUNTER — Encounter: Payer: Self-pay | Admitting: Internal Medicine

## 2023-04-12 DIAGNOSIS — E1140 Type 2 diabetes mellitus with diabetic neuropathy, unspecified: Secondary | ICD-10-CM | POA: Diagnosis not present

## 2023-04-12 DIAGNOSIS — F3341 Major depressive disorder, recurrent, in partial remission: Secondary | ICD-10-CM | POA: Diagnosis not present

## 2023-04-12 DIAGNOSIS — I129 Hypertensive chronic kidney disease with stage 1 through stage 4 chronic kidney disease, or unspecified chronic kidney disease: Secondary | ICD-10-CM | POA: Diagnosis not present

## 2023-04-12 DIAGNOSIS — E1151 Type 2 diabetes mellitus with diabetic peripheral angiopathy without gangrene: Secondary | ICD-10-CM | POA: Diagnosis not present

## 2023-04-12 DIAGNOSIS — G9341 Metabolic encephalopathy: Secondary | ICD-10-CM | POA: Diagnosis not present

## 2023-04-12 DIAGNOSIS — F0393 Unspecified dementia, unspecified severity, with mood disturbance: Secondary | ICD-10-CM | POA: Diagnosis not present

## 2023-04-12 DIAGNOSIS — E1122 Type 2 diabetes mellitus with diabetic chronic kidney disease: Secondary | ICD-10-CM | POA: Diagnosis not present

## 2023-04-12 DIAGNOSIS — D631 Anemia in chronic kidney disease: Secondary | ICD-10-CM | POA: Diagnosis not present

## 2023-04-12 DIAGNOSIS — N1831 Chronic kidney disease, stage 3a: Secondary | ICD-10-CM | POA: Diagnosis not present

## 2023-04-13 ENCOUNTER — Encounter (INDEPENDENT_AMBULATORY_CARE_PROVIDER_SITE_OTHER): Payer: Self-pay

## 2023-04-13 MED ORDER — METHYLPHENIDATE HCL 5 MG PO TABS: 5.0000 mg | ORAL_TABLET | Freq: Two times a day (BID) | ORAL | 0 refills | Status: DC

## 2023-04-13 MED ORDER — GLIPIZIDE 5 MG PO TABS: 5.0000 mg | ORAL_TABLET | Freq: Every day | ORAL | 3 refills | Status: DC

## 2023-04-24 ENCOUNTER — Encounter: Payer: Self-pay | Admitting: Internal Medicine

## 2023-04-24 ENCOUNTER — Ambulatory Visit: Payer: Medicare PPO | Admitting: Neurology

## 2023-05-01 ENCOUNTER — Emergency Department: Payer: Medicare PPO

## 2023-05-01 ENCOUNTER — Encounter: Payer: Self-pay | Admitting: Emergency Medicine

## 2023-05-01 ENCOUNTER — Emergency Department
Admission: EM | Admit: 2023-05-01 | Discharge: 2023-05-01 | Disposition: A | Discharge status: Home / Self Care | Payer: Medicare PPO | Source: Home / Self Care | Attending: Emergency Medicine | Admitting: Emergency Medicine

## 2023-05-01 DIAGNOSIS — R197 Diarrhea, unspecified: Secondary | ICD-10-CM | POA: Diagnosis not present

## 2023-05-01 DIAGNOSIS — J9811 Atelectasis: Secondary | ICD-10-CM | POA: Diagnosis not present

## 2023-05-01 DIAGNOSIS — R1111 Vomiting without nausea: Secondary | ICD-10-CM | POA: Diagnosis not present

## 2023-05-01 DIAGNOSIS — R0989 Other specified symptoms and signs involving the circulatory and respiratory systems: Secondary | ICD-10-CM | POA: Diagnosis not present

## 2023-05-01 DIAGNOSIS — R5383 Other fatigue: Secondary | ICD-10-CM | POA: Diagnosis not present

## 2023-05-01 DIAGNOSIS — R531 Weakness: Secondary | ICD-10-CM | POA: Diagnosis not present

## 2023-05-01 DIAGNOSIS — R9389 Abnormal findings on diagnostic imaging of other specified body structures: Secondary | ICD-10-CM | POA: Diagnosis not present

## 2023-05-01 DIAGNOSIS — R4182 Altered mental status, unspecified: Secondary | ICD-10-CM | POA: Diagnosis not present

## 2023-05-01 DIAGNOSIS — R110 Nausea: Secondary | ICD-10-CM | POA: Diagnosis not present

## 2023-05-01 DIAGNOSIS — I959 Hypotension, unspecified: Secondary | ICD-10-CM | POA: Diagnosis not present

## 2023-05-01 DIAGNOSIS — R001 Bradycardia, unspecified: Secondary | ICD-10-CM | POA: Diagnosis not present

## 2023-05-01 LAB — COMPREHENSIVE METABOLIC PANEL
ALT: 21 U/L (ref 0–44)
AST: 30 U/L (ref 15–41)
Albumin: 3.8 g/dL (ref 3.5–5.0)
Alkaline Phosphatase: 55 U/L (ref 38–126)
Anion gap: 12 (ref 5–15)
BUN: 31 mg/dL — ABNORMAL HIGH (ref 8–23)
CO2: 23 mmol/L (ref 22–32)
Calcium: 9.4 mg/dL (ref 8.9–10.3)
Chloride: 100 mmol/L (ref 98–111)
Creatinine, Ser: 1.27 mg/dL — ABNORMAL HIGH (ref 0.61–1.24)
GFR, Estimated: 56 mL/min — ABNORMAL LOW (ref >60)
Glucose, Bld: 148 mg/dL — ABNORMAL HIGH (ref 70–99)
Potassium: 4 mmol/L (ref 3.5–5.1)
Sodium: 135 mmol/L (ref 135–145)
Total Bilirubin: 1 mg/dL (ref 0.3–1.2)
Total Protein: 7 g/dL (ref 6.5–8.1)

## 2023-05-01 LAB — CBC WITH DIFFERENTIAL/PLATELET
Abs Immature Granulocytes: 0.01 10*3/uL (ref 0.00–0.07)
Basophils Absolute: 0 10*3/uL (ref 0.0–0.1)
Basophils Relative: 1 %
Eosinophils Absolute: 0.1 10*3/uL (ref 0.0–0.5)
Eosinophils Relative: 2 %
HCT: 50.4 % (ref 39.0–52.0)
Hemoglobin: 16.2 g/dL (ref 13.0–17.0)
Immature Granulocytes: 0 %
Lymphocytes Relative: 24 %
Lymphs Abs: 1.3 10*3/uL (ref 0.7–4.0)
MCH: 29.7 pg (ref 26.0–34.0)
MCHC: 32.1 g/dL (ref 30.0–36.0)
MCV: 92.3 fL (ref 80.0–100.0)
Monocytes Absolute: 0.4 10*3/uL (ref 0.1–1.0)
Monocytes Relative: 8 %
Neutro Abs: 3.6 10*3/uL (ref 1.7–7.7)
Neutrophils Relative %: 65 %
Platelets: 266 10*3/uL (ref 150–400)
RBC: 5.46 MIL/uL (ref 4.22–5.81)
RDW: 14.3 % (ref 11.5–15.5)
WBC: 5.4 10*3/uL (ref 4.0–10.5)
nRBC: 0 % (ref 0.0–0.2)

## 2023-05-01 LAB — URINALYSIS, W/ REFLEX TO CULTURE (INFECTION SUSPECTED)
Bacteria, UA: NONE SEEN
Bilirubin Urine: NEGATIVE
Glucose, UA: 500 mg/dL — AB
Hgb urine dipstick: NEGATIVE
Ketones, ur: 15 mg/dL — AB
Leukocytes,Ua: NEGATIVE
Nitrite: NEGATIVE
Protein, ur: NEGATIVE mg/dL
Specific Gravity, Urine: 1.02 (ref 1.005–1.030)
Squamous Epithelial / HPF: NONE SEEN /HPF (ref 0–5)
pH: 5.5 (ref 5.0–8.0)

## 2023-05-01 LAB — LACTIC ACID, PLASMA: Lactic Acid, Venous: 1.8 mmol/L (ref 0.5–1.9)

## 2023-05-01 LAB — LIPASE, BLOOD: Lipase: 29 U/L (ref 11–51)

## 2023-05-01 NOTE — Discharge Instructions (Signed)
Your workup in the Emergency Department today was reassuring.  We did not find any specific abnormalities.  We recommend you drink plenty of fluids, take your regular medications and/or any new ones prescribed today, and follow up with the doctor(s) listed in these documents as recommended.  Return to the Emergency Department if you develop new or worsening symptoms that concern you.  

## 2023-05-01 NOTE — ED Triage Notes (Signed)
Patient brought in by [facility] tonight from home. Per EMS wife reported that she noticed he was more weak than normal was unable to get himself back into bed. Initially for EMS BP was lower, they administered NS and had BP improvement. On arrival patient is awake, alert, able to answer questions appropriately but is very hard of hearing.

## 2023-05-01 NOTE — ED Provider Notes (Signed)
[facility] Provider Note    Event Date/Time   First MD Initiated Contact with Patient 05/01/23 [phone]     (approximate)   History   Weakness and Fatigue  Level 5 caveat: History may be limited by either the patient's age related memory issues, by acute altered mental status, or both.  HPI Randy Mcdaniel. is a 82 y.o. male who presents by EMS for evaluation of generalized weakness.  Reportedly the wife called the paramedics because they were worried that he was "more weak than normal" and was unable to get himself back into bed after getting up during the night.  Blood pressure was initially low for EMS with systolic in the 80s but after 937 mL normal saline his blood pressure improved to normal range.  The patient said that he is here because his wife was worried about him but he has no complaints or concerns.  He denies any pain and denies shortness of breath.  However he is not able to provide much history and does not know why he was out of bed.  PCP is Dr. Alphonsus Sias.     Physical Exam   Triage Vital Signs: ED Triage Vitals  Encounter Vitals Group     BP 05/01/23 0311 110/70     Systolic BP Percentile --      Diastolic BP Percentile --      Pulse Rate 05/01/23 0311 96     Resp 05/01/23 0311 18     Temp 05/01/23 0311 98.5 F (36.9 C)     Temp Source 05/01/23 0311 Oral     SpO2 05/01/23 0311 93 %     Weight 05/01/23 0313 82 kg (180 lb 12.4 oz)     Height 05/01/23 0313 1.753 m (5\' 9" )     Head Circumference --      Peak Flow --      Pain Score 05/01/23 0313 0     Pain Loc --      Pain Education --      Exclude from Growth Chart --     Most recent vital signs: Vitals:   05/01/23 0311 05/01/23 0502  BP: 110/70 119/78  Pulse: 96 70  Resp: 18   Temp: 98.5 F (36.9 C)   SpO2: 93% 93%    General: Awake, alert, elderly but in no obvious distress. CV:  Good peripheral perfusion.  Regular rate and rhythm. Resp:  Normal effort. Speaking  easily and comfortably, no accessory muscle usage nor intercostal retractions.  Lungs clear to auscultation. Abd:  No distention.  No tenderness to palpation of the abdomen. Other:  Patient knows his name and where he is but appears to become easily confused.  He is not able to provide history about what happened tonight other than to say that his wife was worried about him.  I do not appreciate any focal neurological deficits; he is having no difficulty expressing himself and is moving all of his limbs equally.   ED Results / Procedures / Treatments   Labs (all labs ordered are listed, but only abnormal results are displayed) Labs Reviewed  COMPREHENSIVE METABOLIC PANEL - Abnormal; Notable for the following components:      Result Value   Glucose, Bld 148 (*)    BUN 31 (*)    Creatinine, Ser 1.27 (*)    GFR, Estimated 56 (*)    All other components within normal limits  URINALYSIS, W/ REFLEX TO CULTURE (INFECTION SUSPECTED) -  Abnormal; Notable for the following components:   Glucose, UA 500 (*)    Ketones, ur 15 (*)    All other components within normal limits  LACTIC ACID, PLASMA  CBC WITH DIFFERENTIAL/PLATELET  LIPASE, BLOOD     EKG  ED ECG REPORT I, Loleta Rose, the attending physician, personally viewed and interpreted this ECG.  Date: 05/01/2023 EKG Time: 3:14 AM Rate: 93 Rhythm: Probable sinus rhythm QRS Axis: normal Intervals: IVCD with LAD ST/T Wave abnormalities: Non-specific ST segment / T-wave changes, but no clear evidence of acute ischemia. Narrative Interpretation: no definitive evidence of acute ischemia; does not meet STEMI criteria.    RADIOLOGY I viewed and interpreted the patient's two-view chest x-ray.   PROCEDURES:  Critical Care performed: No  Procedures    IMPRESSION / MDM / ASSESSMENT AND PLAN / ED COURSE  I reviewed the triage vital signs and the nursing notes.                              Differential diagnosis includes, but is  not limited to, general deconditioning, acute infectious process such as UTI or pneumonia, ACS, acute metabolic encephalopathy, metabolic or electrolyte abnormality including renal dysfunction/failure.  Dementia versus delirium is very probable as well.  Patient's presentation is most consistent with acute presentation with potential threat to life or bodily function.  Labs/studies ordered: 2 view chest x-ray, EKG, urinalysis, lactic acid, CBC with differential, CMP, lipase  Interventions/Medications given:  Medications - No data to display  (Note:  hospital course my include additional interventions and/or labs/studies not listed above.)   Strongly suspect generalized deconditioning and probably an element of chronic dementia.  Patient is in no distress with no complaints.  I will initiate a generalized and broad workup but I anticipate discharge back home if no acute issues are identified to require hospitalization.  The patient is on the cardiac monitor to evaluate for evidence of arrhythmia and/or significant heart rate changes.   Clinical Course as of 05/01/23 0555  Mon May 01, 2023  0331 DG Chest 2 View [CF]  [phone] CBC with Differential Normal CBC with differential [CF]  0418 DG Chest 2 View I viewed and interpreted the patient's two-view chest x-ray and I see no evidence of pneumonia.  Radiology report agrees and mentions chronically elevated hemidiaphragms but no evidence of acute infection or other acute abnormality. [CF]  0507 Evaluation thus far has been very reassuring.  Everything is at baseline including his creatinine of 1.27.  His wife is at the bedside now and is reassured by the evaluation.  The last thing to check is a urinalysis to make sure there is no evidence of infection.  The wife is comfortable with the plan for discharge after this, with or without antibiotics for a UTI.  The patient is unable to provide a specimen and is comfortable with the plan for In and Out  catheterization.  Of note, the patient is wife is requesting EMS assistance with transport back home because she cannot assist the patient adequately to get him out of the car.  I explained that this may be considered EMS transportation without medical necessity, but she understands. [CF]  0553 Urinalysis, w/ Reflex to Culture (Infection Suspected) -Urine, Clean Catch(!) Negative urinalysis, patient appropriate for discharge as per previously described plan. [CF]    Clinical Course User Index [CF] Loleta Rose, MD     FINAL CLINICAL IMPRESSION(S) / ED DIAGNOSES  Final diagnoses:  Generalized weakness     Rx / DC Orders   ED Discharge Orders     None        Note:  This document was prepared using Dragon voice recognition software and may include unintentional dictation errors.   Loleta Rose, MD 05/01/23 [phone]

## 2023-05-01 NOTE — ED Notes (Signed)
Patient and spouse provided with discharge instructions including importance of follow up appt with stated understanding. INTs removed, cannula intact, pressure dressing applied. Patient stable and wheeled out to car on dispo.

## 2023-05-01 NOTE — ED Notes (Signed)
Straight cath to obtain UA and sent to lab. New brief placed on patient. VSS, no other needs identified at this time.

## 2023-05-03 ENCOUNTER — Encounter: Payer: Self-pay | Admitting: Internal Medicine

## 2023-05-03 ENCOUNTER — Ambulatory Visit: Payer: Medicare PPO | Admitting: Internal Medicine

## 2023-05-03 VITALS — BP 138/60 | HR 82 | Resp 18

## 2023-05-03 DIAGNOSIS — E441 Mild protein-calorie malnutrition: Secondary | ICD-10-CM | POA: Diagnosis not present

## 2023-05-03 DIAGNOSIS — F331 Major depressive disorder, recurrent, moderate: Secondary | ICD-10-CM

## 2023-05-03 DIAGNOSIS — E1149 Type 2 diabetes mellitus with other diabetic neurological complication: Secondary | ICD-10-CM | POA: Diagnosis not present

## 2023-05-03 DIAGNOSIS — Z7984 Long term (current) use of oral hypoglycemic drugs: Secondary | ICD-10-CM

## 2023-05-03 DIAGNOSIS — G214 Vascular parkinsonism: Secondary | ICD-10-CM | POA: Diagnosis not present

## 2023-05-03 MED ORDER — QUETIAPINE FUMARATE 25 MG PO TABS: 25.0000 mg | ORAL_TABLET | Freq: Every day | ORAL | 3 refills | Status: DC

## 2023-05-03 NOTE — Assessment & Plan Note (Signed)
Lab Results  Component Value Date   HGBA1C 7.1 (A) 03/06/2023   Recent check still okay On jardiance 25 and glipizide 5mg  daily Gabapentin only sporadically --and only at night (it is not related to the morning sedation)

## 2023-05-03 NOTE — Assessment & Plan Note (Signed)
Bradykinesia and slow gait but no tremor Seems to be vascular

## 2023-05-03 NOTE — Progress Notes (Signed)
Subjective:    Patient ID: Randy Mcdaniel., male    DOB: 1941-04-13, 82 y.o.   MRN: [id number]  HPI Home visit for follow up of worsening status and recent ER visit Wife and daughter Angelica Chessman are here  Hospitalized in June then to rehab In [place] for 2 weeks--didn't like it Home for almost 2 months  Getting sweats at times Prompted wife to call rescue---went 9/2 Labs okay---GFR 56 (actually better than June)  Has had significant decline Hard to get him out of the house--so we planned the home visit Canceled neurology appointment---wife didn't think she could get him there (can't get him in and out of car)  Still able to walk with walker Uses bathroom himself--still continent Aides now help with bathing---has 4 hours twice a week (will have for a total of 11 weeks through [facility]) Wife can't leave him---aide watches him when she wants to go out (her expense)  No pain No chest pain or SOB  Sugars 160-210 in general Once over 300----but apparently after eating something sweet  Often sleeps most of the day She can't get him out of bed----and then he will fall asleep in the chair Denies depression but no alternate explanation for his status  Current Outpatient Medications on File Prior to Visit  Medication Sig Dispense Refill   acetaminophen (TYLENOL) 650 MG CR tablet Take 650 mg by mouth every 8 (eight) hours as needed for pain.      empagliflozin (JARDIANCE) 25 MG TABS tablet TAKE 1 TABLET BY MOUTH ONCE A DAY BEFOREBREAKFAST 100 tablet 3   escitalopram (LEXAPRO) 20 MG tablet Take 1 tablet (20 mg total) by mouth daily. 1 tablet 0   feeding supplement (ENSURE ENLIVE / ENSURE PLUS) LIQD Take 237 mLs by mouth 3 (three) times daily between meals. 237 mL 12   gabapentin (NEURONTIN) 300 MG capsule Take 900 mg by mouth at bedtime.     glipiZIDE (GLUCOTROL) 5 MG tablet Take 1 tablet (5 mg total) by mouth daily before breakfast. 90 tablet 3   levothyroxine (SYNTHROID) 75 MCG  tablet TAKE ONE TABLET BY MOUTH ONCE DAILY 90 tablet 3   methylphenidate (RITALIN) 5 MG tablet Take 1 tablet (5 mg total) by mouth 2 (two) times daily with breakfast and lunch. 60 tablet 0   polyethylene glycol (MIRALAX / GLYCOLAX) 17 g packet Take 17 g by mouth daily as needed. 14 each 0   senna-docusate (SENOKOT-S) 8.6-50 MG tablet Take 1 tablet by mouth 2 (two) times daily.     tamsulosin (FLOMAX) 0.4 MG CAPS capsule Take 2 capsules (0.8 mg total) by mouth daily. 180 capsule 3   vitamin B-12 (CYANOCOBALAMIN) 1000 MCG tablet Take 1,000 mcg by mouth daily.     Zinc Oxide (TRIPLE PASTE) 12.8 % ointment Apply 1 Application topically. Every shift     No current facility-administered medications on file prior to visit.    Allergies  Allergen Reactions   Vytorin [Ezetimibe-Simvastatin] Nausea Only    patient nor wife does not remember    Past Medical History:  Diagnosis Date   Anemia    Anxiety    Dementia (HCC)    Depression    recurrent and severe   Diabetes mellitus    Type II with nephropathy   GI bleed    HLD (hyperlipidemia)    Hypertension    Hypothyroidism    Internal hemorrhoid    Osteoarthritis    Personal history of colonic adenoma 12/03/2012  Past Surgical History:  Procedure Laterality Date   COLONOSCOPY     COLONOSCOPY WITH PROPOFOL N/A 12/20/2016   Procedure: COLONOSCOPY WITH PROPOFOL;  Surgeon: Wyline Mood, MD;  Location: [facility] ENDOSCOPY;  Service: Endoscopy;  Laterality: N/A;   ESOPHAGOGASTRODUODENOSCOPY (EGD) WITH PROPOFOL N/A 12/17/2016   Procedure: ESOPHAGOGASTRODUODENOSCOPY (EGD) WITH PROPOFOL;  Surgeon: Charlott Rakes, MD;  Location: [facility] ENDOSCOPY;  Service: Endoscopy;  Laterality: N/A;   FLEXIBLE SIGMOIDOSCOPY N/A 12/17/2016   Procedure: FLEXIBLE SIGMOIDOSCOPY;  Surgeon: Charlott Rakes, MD;  Location: [facility] ENDOSCOPY;  Service: Endoscopy;  Laterality: N/A;   HEMORRHOID BANDING  04/2017   HEMORRHOID SURGERY N/A 06/14/2017   Procedure: HEMORRHOIDECTOMY;   Surgeon: Kieth Brightly, MD;  Location: [facility] ORS;  Service: General;  Laterality: N/A;   HERNIA REPAIR     JOINT REPLACEMENT Bilateral    PROCTOSCOPY N/A 06/14/2017   Procedure: PROCTOSCOPY;  Surgeon: Kieth Brightly, MD;  Location: [facility] ORS;  Service: General;  Laterality: N/A;   TOTAL KNEE ARTHROPLASTY  11/09   Right-Dr. Remer Macho ([facility])   TOTAL KNEE ARTHROPLASTY  6/10   Left-Dr. Remer Macho ([facility])   UMBILICAL HERNIA REPAIR  2000    Family History  Problem Relation Age of Onset   Heart failure Father    Diabetes Father    Depression Father    Coronary artery disease Father    Breast cancer Sister 20   Lung cancer Sister    Depression Sister    Stomach cancer Mother 37   Stroke Other        Paternal side   Parkinsonism Other        Cousin   Colon cancer Neg Hx    Rectal cancer Neg Hx    Esophageal cancer Neg Hx    Colon polyps Neg Hx     Social History   Socioeconomic History   Marital status: Married    Spouse name: [name]   Number of children: 2   Years of education: Not on file   Highest education level: Not on file  Occupational History   Occupation: Retired  Tobacco Use   Smoking status: Never    Passive exposure: Past   Smokeless tobacco: Former    Types: Chew    Quit date: 05/15/2002  Vaping Use   Vaping status: Never Used  Substance and Sexual Activity   Alcohol use: No    Alcohol/week: 0.0 standard drinks of alcohol   Drug use: No   Sexual activity: Not Currently  Other Topics Concern   Not on file  Social History Narrative   Has living will.   Wife is formal health care POA.  Daughter [name] is alternate   Would accept resuscitation attempts   No tube feeds if cognitively unaware   Social Determinants of Health   Financial Resource Strain: Not on file  Food Insecurity: No Food Insecurity (02/07/2023)   Hunger Vital Sign    Worried About Running Out of Food in the Last Year: Never true    Ran Out of Food in the Last Year: Never true   Transportation Needs: No Transportation Needs (02/07/2023)   PRAPARE - Administrator, Civil Service (Medical): No    Lack of Transportation (Non-Medical): No  Physical Activity: Not on file  Stress: Not on file  Social Connections: Not on file  Intimate Partner Violence: Not At Risk (02/07/2023)   Humiliation, Afraid, Rape, and Kick questionnaire    Fear of Current or Ex-Partner: No    Emotionally Abused:  No    Physically Abused: No    Sexually Abused: No    Review of Systems Not eating and has lost considerable weight Bowels are slow--may go days without going. No abdominal pain No N/V Voids okay since starting tamsulosin     Objective:   Physical Exam Constitutional:      Comments: Some wasting  Cardiovascular:     Rate and Rhythm: Normal rate and regular rhythm.     Heart sounds: No murmur heard.    No gallop.     Comments: Feet are cool without pulses--no skin breakdown Pulmonary:     Effort: Pulmonary effort is normal.     Breath sounds: Normal breath sounds. No wheezing or rales.  Abdominal:     Palpations: Abdomen is soft.     Tenderness: There is no abdominal tenderness.  Musculoskeletal:     Cervical back: Neck supple.     Right lower leg: No edema.     Left lower leg: No edema.  Lymphadenopathy:     Cervical: No cervical adenopathy.  Neurological:     Mental Status: He is alert.     Comments: No focal weakness   Psychiatric:     Comments: Psychomotor retardation Answers questions but sparse            Assessment & Plan:

## 2023-05-03 NOTE — Assessment & Plan Note (Signed)
Wont get out of bed or eat many days (or one meal a day) Wife and daughter have been trying

## 2023-05-03 NOTE — Assessment & Plan Note (Signed)
Seems to be the major issue now On escitalopram 20 Methylphenidate 5mg  ---inconsistent with this (they only give it if he gets out of bed)---asked wife to try 2 when he gets up  Novel therapies (ketamine, ECT) not likely available with his age Will try adding quetiapine 25-50mg  at bedtime

## 2023-05-09 ENCOUNTER — Encounter: Payer: Self-pay | Admitting: Internal Medicine

## 2023-05-11 ENCOUNTER — Telehealth: Payer: Self-pay | Admitting: Internal Medicine

## 2023-05-11 DIAGNOSIS — E441 Mild protein-calorie malnutrition: Secondary | ICD-10-CM

## 2023-05-11 DIAGNOSIS — R4182 Altered mental status, unspecified: Secondary | ICD-10-CM

## 2023-05-11 DIAGNOSIS — G214 Vascular parkinsonism: Secondary | ICD-10-CM

## 2023-05-11 NOTE — Telephone Encounter (Signed)
Spoke to Julie.

## 2023-05-11 NOTE — Telephone Encounter (Signed)
Randy Mcdaniel from [facility] called over and wanted to know if they could get a referral for hospice. She wanted to know if Dr. Alphonsus Sias would continue to be his attending, and if he would like for hospice doctors to manage comfort orders.

## 2023-05-18 ENCOUNTER — Telehealth: Payer: Self-pay

## 2023-05-18 NOTE — Telephone Encounter (Signed)
Transition Care Management Unsuccessful Follow-up Telephone Call  Date of discharge and from where:  [facility] 9/2  Attempts:  1st Attempt  Reason for unsuccessful TCM follow-up call:  No answer/busy   [facility]  [facility] Guide, Phone: [phone] Website: [email or web address]

## 2023-05-19 ENCOUNTER — Telehealth: Payer: Self-pay

## 2023-05-19 NOTE — Telephone Encounter (Signed)
Transition Care Management Follow-up Telephone Call Date of discharge and from where: [facility] 9/2 How have you been since you were released from the hospital? Pt has been following up with PCP and resting  Any questions or concerns? No  Items Reviewed: Did the pt receive and understand the discharge instructions provided? Yes  Medications obtained and verified? Yes  Other? No  Any new allergies since your discharge? No  Dietary orders reviewed? No Do you have support at home? Yes     Follow up appointments reviewed:  PCP Hospital f/u appt confirmed? Yes  Scheduled to see  on  @  Specialist Hospital f/u appt confirmed? No  Scheduled to see  on  @ . Are transportation arrangements needed? No  If their condition worsens, is the pt aware to call PCP or go to the Emergency Dept.? Yes Was the patient provided with contact information for the PCP's office or ED? Yes Was to pt encouraged to call back with questions or concerns? Yes

## 2023-05-23 ENCOUNTER — Ambulatory Visit: Payer: Medicare PPO | Admitting: Internal Medicine

## 2023-06-09 ENCOUNTER — Encounter: Payer: Self-pay | Admitting: Internal Medicine

## 2023-06-09 DIAGNOSIS — E1149 Type 2 diabetes mellitus with other diabetic neurological complication: Secondary | ICD-10-CM | POA: Diagnosis not present

## 2023-06-21 ENCOUNTER — Encounter: Payer: Self-pay | Admitting: Internal Medicine

## 2023-06-21 ENCOUNTER — Ambulatory Visit: Payer: Medicare PPO | Admitting: Internal Medicine

## 2023-06-21 VITALS — BP 112/60 | HR 84 | Resp 16

## 2023-06-21 DIAGNOSIS — E1149 Type 2 diabetes mellitus with other diabetic neurological complication: Secondary | ICD-10-CM

## 2023-06-21 DIAGNOSIS — F331 Major depressive disorder, recurrent, moderate: Secondary | ICD-10-CM | POA: Diagnosis not present

## 2023-06-21 DIAGNOSIS — D6869 Other thrombophilia: Secondary | ICD-10-CM | POA: Diagnosis not present

## 2023-06-21 DIAGNOSIS — Z7984 Long term (current) use of oral hypoglycemic drugs: Secondary | ICD-10-CM

## 2023-06-21 DIAGNOSIS — Z23 Encounter for immunization: Secondary | ICD-10-CM | POA: Diagnosis not present

## 2023-06-21 DIAGNOSIS — G629 Polyneuropathy, unspecified: Secondary | ICD-10-CM | POA: Diagnosis not present

## 2023-06-21 DIAGNOSIS — G214 Vascular parkinsonism: Secondary | ICD-10-CM

## 2023-06-21 DIAGNOSIS — E063 Autoimmune thyroiditis: Secondary | ICD-10-CM

## 2023-06-21 DIAGNOSIS — R5381 Other malaise: Secondary | ICD-10-CM | POA: Insufficient documentation

## 2023-06-21 NOTE — Progress Notes (Signed)
Subjective:    Patient ID: Randy Mcdaniel., male    DOB: [birth year], 82 y.o.   MRN: [id number]  HPI Home visit for follow up of this hospice patient Wife and daughter are here Randy Mcdaniel also-- she is here 5 days per day  Now on hospice Gets aide for baths 3 days per week Tiffany RN is weekly Has hospital bed Not clear if he has lost more weight---can't get on scale Does take a protein shake daily  Still not eating---may have two 1/2 sandwiches a day Still likes to sleep well---often has to be forced out of bed to eat at [facility] (and then often goes back)  No pain  They check sugars periodically 190 today before eating Almost 300 recently--but after ice cream and cake Still has CGM and time in green zone is 75%  Has had episodic confusion Was in bathroom and couldn't figure out how to get up Needed wheelchair Walks with walker Supervision for bathroom Needs to be dressed Still continent--on flomax  Mood is fair Doesn't want to do anything or get out of  bed--but doesn't seem to be from depression Still uses this as control over wife--since she wants him to do more  Current Outpatient Medications on File Prior to Visit  Medication Sig Dispense Refill   acetaminophen (TYLENOL) 650 MG CR tablet Take 650 mg by mouth every 8 (eight) hours as needed for pain.      escitalopram (LEXAPRO) 20 MG tablet Take 1 tablet (20 mg total) by mouth daily. 1 tablet 0   glipiZIDE (GLUCOTROL) 5 MG tablet Take 1 tablet (5 mg total) by mouth daily before breakfast. 90 tablet 3   levothyroxine (SYNTHROID) 75 MCG tablet TAKE ONE TABLET BY MOUTH ONCE DAILY 90 tablet 3   polyethylene glycol (MIRALAX / GLYCOLAX) 17 g packet Take 17 g by mouth daily as needed. 14 each 0   senna-docusate (SENOKOT-S) 8.6-50 MG tablet Take 1 tablet by mouth 2 (two) times daily.     tamsulosin (FLOMAX) 0.4 MG CAPS capsule Take 2 capsules (0.8 mg total) by mouth daily. 180 capsule 3   vitamin B-12 (CYANOCOBALAMIN) 1000  MCG tablet Take 1,000 mcg by mouth daily.     Zinc Oxide (TRIPLE PASTE) 12.8 % ointment Apply 1 Application topically. Every shift     No current facility-administered medications on file prior to visit.    Allergies  Allergen Reactions   Vytorin [Ezetimibe-Simvastatin] Nausea Only    patient nor wife does not remember    Past Medical History:  Diagnosis Date   Anemia    Anxiety    Dementia (HCC)    Depression    recurrent and severe   Diabetes mellitus    Type II with nephropathy   GI bleed    HLD (hyperlipidemia)    Hypertension    Hypothyroidism    Internal hemorrhoid    Osteoarthritis    Personal history of colonic adenoma 12/03/2012    Past Surgical History:  Procedure Laterality Date   COLONOSCOPY     COLONOSCOPY WITH PROPOFOL N/A 12/20/2016   Procedure: COLONOSCOPY WITH PROPOFOL;  Surgeon: Wyline Mood, MD;  Location: [facility] ENDOSCOPY;  Service: Endoscopy;  Laterality: N/A;   ESOPHAGOGASTRODUODENOSCOPY (EGD) WITH PROPOFOL N/A 12/17/2016   Procedure: ESOPHAGOGASTRODUODENOSCOPY (EGD) WITH PROPOFOL;  Surgeon: Charlott Rakes, MD;  Location[facility] ENDOSCOPY;  Service: Endoscopy;  Laterality: N/A;   FLEXIBLE SIGMOIDOSCOPY N/A 12/17/2016   Procedure: FLEXIBLE SIGMOIDOSCOPY;  Surgeon: Charlott Rakes, MD;  Location:  [facility] ENDOSCOPY;  Service: Endoscopy;  Laterality: N/A;   HEMORRHOID BANDING  04/2017   HEMORRHOID SURGERY N/A 06/14/2017   Procedure: HEMORRHOIDECTOMY;  Surgeon: Kieth Brightly, MD;  Location: [facility] ORS;  Service: General;  Laterality: N/A;   HERNIA REPAIR     JOINT REPLACEMENT Bilateral    PROCTOSCOPY N/A 06/14/2017   Procedure: PROCTOSCOPY;  Surgeon: Kieth Brightly, MD;  Location: [facility] ORS;  Service: General;  Laterality: N/A;   TOTAL KNEE ARTHROPLASTY  11/09   Right-Dr. Remer Macho ([facility])   TOTAL KNEE ARTHROPLASTY  6/10   Left-Dr. Remer Macho ([facility])   UMBILICAL HERNIA REPAIR  2000    Family History  Problem Relation Age of Onset   Heart failure  Father    Diabetes Father    Depression Father    Coronary artery disease Father    Breast cancer Sister 86   Lung cancer Sister    Depression Sister    Stomach cancer Mother 68   Stroke Other        Paternal side   Parkinsonism Other        Cousin   Colon cancer Neg Hx    Rectal cancer Neg Hx    Esophageal cancer Neg Hx    Colon polyps Neg Hx     Social History   Socioeconomic History   Marital status: Married    Spouse name: Randy Mcdaniel   Number of children: 2   Years of education: Not on file   Highest education level: Not on file  Occupational History   Occupation: Retired  Tobacco Use   Smoking status: Never    Passive exposure: Past   Smokeless tobacco: Former    Types: Chew    Quit date: 05/15/2002  Vaping Use   Vaping status: Never Used  Substance and Sexual Activity   Alcohol use: No    Alcohol/week: 0.0 standard drinks of alcohol   Drug use: No   Sexual activity: Not Currently  Other Topics Concern   Not on file  Social History Narrative   Has living will.   Wife is formal health care POA.  Daughter Randy Mcdaniel is alternate   Would accept resuscitation attempts   No tube feeds if cognitively unaware   Social Determinants of Health   Financial Resource Strain: Not on file  Food Insecurity: No Food Insecurity (02/07/2023)   Hunger Vital Sign    Worried About Running Out of Food in the Last Year: Never true    Ran Out of Food in the Last Year: Never true  Transportation Needs: No Transportation Needs (02/07/2023)   PRAPARE - Administrator, Civil Service (Medical): No    Lack of Transportation (Non-Medical): No  Physical Activity: Not on file  Stress: Not on file  Social Connections: Not on file  Intimate Partner Violence: Not At Risk (02/07/2023)   Humiliation, Afraid, Rape, and Kick questionnaire    Fear of Current or Ex-Partner: No    Emotionally Abused: No    Physically Abused: No    Sexually Abused: No   Review of Systems Complains about  constipation Lots of bruising--but no skin breakdown.Has pressure reducing mattress No falls in the last 3 weeks    Objective:   Physical Exam Constitutional:      Comments: Mild wasting  Cardiovascular:     Rate and Rhythm: Normal rate and regular rhythm.     Heart sounds: No murmur heard.    No gallop.     Comments: Feet  cool without pulses Pulmonary:     Effort: Pulmonary effort is normal.     Breath sounds: Normal breath sounds. No wheezing or rales.  Abdominal:     Palpations: Abdomen is soft.     Tenderness: There is no abdominal tenderness.  Musculoskeletal:     Cervical back: Neck supple.     Right lower leg: No edema.     Left lower leg: No edema.  Lymphadenopathy:     Cervical: No cervical adenopathy.  Skin:    Comments: Widespread bruising arms and some trunk Stasis changes in lower calves  Neurological:     Mental Status: He is alert.     Comments: Shuffling and bradykinesia  Psychiatric:     Comments: No overt depression            Assessment & Plan:

## 2023-06-21 NOTE — Assessment & Plan Note (Signed)
Bruising but no ASA

## 2023-06-21 NOTE — Assessment & Plan Note (Signed)
Now on hospice Needs help with ADLs Sleeps much of the day

## 2023-06-21 NOTE — Assessment & Plan Note (Signed)
Not clearly exacerbated Is on the lexapro still

## 2023-06-21 NOTE — Assessment & Plan Note (Signed)
Bradykinesia, shuffling gait No Rx --wouldn't help

## 2023-06-21 NOTE — Assessment & Plan Note (Signed)
Off farxiga On glipizide now Given functional state and hospice----would only act if fasting sugars are consistently over 200

## 2023-06-21 NOTE — Assessment & Plan Note (Signed)
Asked them to restart the levothyroxine

## 2023-06-21 NOTE — Assessment & Plan Note (Signed)
This is better  Off the gabapentin and not having pain issues

## 2023-06-30 ENCOUNTER — Encounter: Payer: Self-pay | Admitting: Internal Medicine

## 2023-07-05 ENCOUNTER — Emergency Department

## 2023-07-05 ENCOUNTER — Observation Stay
Admission: EM | Admit: 2023-07-05 | Discharge: 2023-07-06 | Disposition: A | Discharge status: Home / Self Care | Attending: Internal Medicine | Admitting: Internal Medicine

## 2023-07-05 ENCOUNTER — Other Ambulatory Visit: Payer: Self-pay

## 2023-07-05 DIAGNOSIS — E1142 Type 2 diabetes mellitus with diabetic polyneuropathy: Secondary | ICD-10-CM

## 2023-07-05 DIAGNOSIS — E119 Type 2 diabetes mellitus without complications: Secondary | ICD-10-CM | POA: Insufficient documentation

## 2023-07-05 DIAGNOSIS — E785 Hyperlipidemia, unspecified: Secondary | ICD-10-CM

## 2023-07-05 DIAGNOSIS — F0390 Unspecified dementia without behavioral disturbance: Secondary | ICD-10-CM | POA: Diagnosis not present

## 2023-07-05 DIAGNOSIS — Z7984 Long term (current) use of oral hypoglycemic drugs: Secondary | ICD-10-CM | POA: Diagnosis not present

## 2023-07-05 DIAGNOSIS — N390 Urinary tract infection, site not specified: Secondary | ICD-10-CM

## 2023-07-05 DIAGNOSIS — E039 Hypothyroidism, unspecified: Secondary | ICD-10-CM | POA: Diagnosis not present

## 2023-07-05 DIAGNOSIS — N400 Enlarged prostate without lower urinary tract symptoms: Secondary | ICD-10-CM | POA: Insufficient documentation

## 2023-07-05 DIAGNOSIS — Z96653 Presence of artificial knee joint, bilateral: Secondary | ICD-10-CM | POA: Diagnosis not present

## 2023-07-05 DIAGNOSIS — R42 Dizziness and giddiness: Secondary | ICD-10-CM | POA: Diagnosis not present

## 2023-07-05 DIAGNOSIS — R296 Repeated falls: Principal | ICD-10-CM

## 2023-07-05 DIAGNOSIS — W19XXXA Unspecified fall, initial encounter: Secondary | ICD-10-CM | POA: Diagnosis not present

## 2023-07-05 DIAGNOSIS — I10 Essential (primary) hypertension: Secondary | ICD-10-CM | POA: Diagnosis not present

## 2023-07-05 DIAGNOSIS — F32A Depression, unspecified: Secondary | ICD-10-CM | POA: Insufficient documentation

## 2023-07-05 DIAGNOSIS — Z87891 Personal history of nicotine dependence: Secondary | ICD-10-CM | POA: Diagnosis not present

## 2023-07-05 DIAGNOSIS — M79604 Pain in right leg: Secondary | ICD-10-CM | POA: Diagnosis not present

## 2023-07-05 DIAGNOSIS — I447 Left bundle-branch block, unspecified: Secondary | ICD-10-CM | POA: Diagnosis not present

## 2023-07-05 DIAGNOSIS — Z79899 Other long term (current) drug therapy: Secondary | ICD-10-CM | POA: Diagnosis not present

## 2023-07-05 DIAGNOSIS — R0902 Hypoxemia: Secondary | ICD-10-CM | POA: Diagnosis not present

## 2023-07-05 LAB — CBC
HCT: 39.2 % (ref 39.0–52.0)
Hemoglobin: 12.7 g/dL — ABNORMAL LOW (ref 13.0–17.0)
MCH: 30.5 pg (ref 26.0–34.0)
MCHC: 32.4 g/dL (ref 30.0–36.0)
MCV: 94.2 fL (ref 80.0–100.0)
Platelets: 277 10*3/uL (ref 150–400)
RBC: 4.16 MIL/uL — ABNORMAL LOW (ref 4.22–5.81)
RDW: 14.6 % (ref 11.5–15.5)
WBC: 6.7 10*3/uL (ref 4.0–10.5)
nRBC: 0 % (ref 0.0–0.2)

## 2023-07-05 LAB — CBC WITH DIFFERENTIAL/PLATELET
Abs Immature Granulocytes: 0.02 10*3/uL (ref 0.00–0.07)
Basophils Absolute: 0.1 10*3/uL (ref 0.0–0.1)
Basophils Relative: 1 %
Eosinophils Absolute: 0.1 10*3/uL (ref 0.0–0.5)
Eosinophils Relative: 2 %
HCT: 40.2 % (ref 39.0–52.0)
Hemoglobin: 13.4 g/dL (ref 13.0–17.0)
Immature Granulocytes: 0 %
Lymphocytes Relative: 19 %
Lymphs Abs: 1.3 10*3/uL (ref 0.7–4.0)
MCH: 30.8 pg (ref 26.0–34.0)
MCHC: 33.3 g/dL (ref 30.0–36.0)
MCV: 92.4 fL (ref 80.0–100.0)
Monocytes Absolute: 0.6 10*3/uL (ref 0.1–1.0)
Monocytes Relative: 8 %
Neutro Abs: 5 10*3/uL (ref 1.7–7.7)
Neutrophils Relative %: 70 %
Platelets: 289 10*3/uL (ref 150–400)
RBC: 4.35 MIL/uL (ref 4.22–5.81)
RDW: 14.4 % (ref 11.5–15.5)
WBC: 7.2 10*3/uL (ref 4.0–10.5)
nRBC: 0 % (ref 0.0–0.2)

## 2023-07-05 LAB — COMPREHENSIVE METABOLIC PANEL
ALT: 16 U/L (ref 0–44)
AST: 22 U/L (ref 15–41)
Albumin: 3.3 g/dL — ABNORMAL LOW (ref 3.5–5.0)
Alkaline Phosphatase: 63 U/L (ref 38–126)
Anion gap: 5 (ref 5–15)
BUN: 34 mg/dL — ABNORMAL HIGH (ref 8–23)
CO2: 27 mmol/L (ref 22–32)
Calcium: 8.7 mg/dL — ABNORMAL LOW (ref 8.9–10.3)
Chloride: 104 mmol/L (ref 98–111)
Creatinine, Ser: 1.13 mg/dL (ref 0.61–1.24)
GFR, Estimated: >60 mL/min (ref >60)
Glucose, Bld: 161 mg/dL — ABNORMAL HIGH (ref 70–99)
Potassium: 4 mmol/L (ref 3.5–5.1)
Sodium: 136 mmol/L (ref 135–145)
Total Bilirubin: 0.7 mg/dL (ref <1.2)
Total Protein: 5.9 g/dL — ABNORMAL LOW (ref 6.5–8.1)

## 2023-07-05 LAB — URINALYSIS, ROUTINE W REFLEX MICROSCOPIC
Bilirubin Urine: NEGATIVE
Glucose, UA: 50 mg/dL — AB
Ketones, ur: NEGATIVE mg/dL
Nitrite: NEGATIVE
Protein, ur: 30 mg/dL — AB
Specific Gravity, Urine: 1.021 (ref 1.005–1.030)
WBC, UA: >50 WBC/hpf (ref 0–5)
pH: 5 (ref 5.0–8.0)

## 2023-07-05 LAB — BASIC METABOLIC PANEL
Anion gap: 6 (ref 5–15)
BUN: 30 mg/dL — ABNORMAL HIGH (ref 8–23)
CO2: 26 mmol/L (ref 22–32)
Calcium: 8.7 mg/dL — ABNORMAL LOW (ref 8.9–10.3)
Chloride: 107 mmol/L (ref 98–111)
Creatinine, Ser: 0.88 mg/dL (ref 0.61–1.24)
GFR, Estimated: >60 mL/min (ref >60)
Glucose, Bld: 128 mg/dL — ABNORMAL HIGH (ref 70–99)
Potassium: 3.8 mmol/L (ref 3.5–5.1)
Sodium: 139 mmol/L (ref 135–145)

## 2023-07-05 MED ORDER — ONDANSETRON HCL 4 MG PO TABS: 4.0000 mg | ORAL_TABLET | Freq: Four times a day (QID) | ORAL | Status: DC | PRN

## 2023-07-05 MED ORDER — SODIUM CHLORIDE 0.9 % IV BOLUS (SEPSIS)
1000.0000 mL | Freq: Once | INTRAVENOUS | Status: AC
  Administered 2023-07-05: 1000 mL via INTRAVENOUS

## 2023-07-05 MED ORDER — LEVOTHYROXINE SODIUM 50 MCG PO TABS
75.0000 ug | ORAL_TABLET | Freq: Every day | ORAL | Status: DC
  Administered 2023-07-05 – 2023-07-06 (×2): 75 ug via ORAL
  Filled 2023-07-05 (×2): qty 1

## 2023-07-05 MED ORDER — ENOXAPARIN SODIUM 40 MG/0.4ML IJ SOSY
40.0000 mg | PREFILLED_SYRINGE | INTRAMUSCULAR | Status: DC
  Administered 2023-07-05 – 2023-07-06 (×2): 40 mg via SUBCUTANEOUS
  Filled 2023-07-05 (×2): qty 0.4

## 2023-07-05 MED ORDER — ONDANSETRON HCL 4 MG/2ML IJ SOLN: 4.0000 mg | Freq: Four times a day (QID) | INTRAMUSCULAR | Status: DC | PRN

## 2023-07-05 MED ORDER — MAGNESIUM HYDROXIDE 400 MG/5ML PO SUSP: 30.0000 mL | Freq: Every day | ORAL | Status: DC | PRN

## 2023-07-05 MED ORDER — SODIUM CHLORIDE 0.9% FLUSH
10.0000 mL | Freq: Two times a day (BID) | INTRAVENOUS | Status: DC
  Administered 2023-07-05 – 2023-07-06 (×3): 10 mL via INTRAVENOUS

## 2023-07-05 MED ORDER — GLIPIZIDE 5 MG PO TABS
5.0000 mg | ORAL_TABLET | Freq: Every day | ORAL | Status: DC
  Administered 2023-07-05 – 2023-07-06 (×2): 5 mg via ORAL
  Filled 2023-07-05 (×2): qty 1

## 2023-07-05 MED ORDER — ACETAMINOPHEN 500 MG PO TABS
1000.0000 mg | ORAL_TABLET | Freq: Once | ORAL | Status: AC
  Administered 2023-07-05: 1000 mg via ORAL
  Filled 2023-07-05: qty 2

## 2023-07-05 MED ORDER — TRAZODONE HCL 50 MG PO TABS
25.0000 mg | ORAL_TABLET | Freq: Every evening | ORAL | Status: DC | PRN
  Administered 2023-07-06: 25 mg via ORAL
  Filled 2023-07-05: qty 1

## 2023-07-05 MED ORDER — ACETAMINOPHEN 650 MG RE SUPP: 650.0000 mg | Freq: Four times a day (QID) | RECTAL | Status: DC | PRN

## 2023-07-05 MED ORDER — SODIUM CHLORIDE 0.9 % IV SOLN: 1.0000 g | Freq: Once | INTRAVENOUS | Status: DC

## 2023-07-05 MED ORDER — SODIUM CHLORIDE 0.9 % IV SOLN
1.0000 g | INTRAVENOUS | Status: DC
  Administered 2023-07-05 – 2023-07-06 (×2): 1 g via INTRAVENOUS
  Filled 2023-07-05 (×2): qty 10

## 2023-07-05 MED ORDER — ACETAMINOPHEN 325 MG PO TABS: 650.0000 mg | ORAL_TABLET | Freq: Four times a day (QID) | ORAL | Status: DC | PRN

## 2023-07-05 NOTE — Progress Notes (Signed)
  Progress Note   Patient: Randy Mcdaniel. NFA:[id number] DOB: 06-22-1941 DOA: 07/05/2023     1 DOS: the patient was seen and examined on 07/05/2023   This is a nonbillable note I discussed with patient PCP today Patient admitted this morning Admits to improvement in his condition Had complained of right shoulder pain however he tells me this is chronic Please find detailed assessment and plan as outlined in H&P by Dr. Arville Care   Patient follows up with outpatient hospice  Vitals:   07/05/23 1300 07/05/23 1430 07/05/23 1500 07/05/23 1745  BP: 138/65 112/61 132/66 (!) 154/71  Pulse: (!) 55 (!) 54 60 (!) 58  Resp: 20 20 18 16   Temp:    98.1 F (36.7 C)  TempSrc:      SpO2: 99% 98% 100% 98%  Weight:      Height:          Author: Loyce Dys, MD 07/05/2023 6:26 PM  For on call review [email or web address].

## 2023-07-05 NOTE — Assessment & Plan Note (Signed)
-   The patient be admitted to a medical-surgical bed. - PT consult to be obtained to assess his LV. - This could be partly related to generalized weakness associated with acute UTI.

## 2023-07-05 NOTE — H&P (Signed)
[place]   PATIENT NAME: Randy Mcdaniel    MR#:  [id number]  DATE OF BIRTH:  Apr 30, 1941  DATE OF ADMISSION:  07/05/2023  PRIMARY CARE PHYSICIAN: Karie Schwalbe, MD   Patient is coming from: Home  REQUESTING/REFERRING PHYSICIAN: Ward, Layla Maw, DO  CHIEF COMPLAINT:   Chief Complaint  Patient presents with   Fall    HISTORY OF PRESENT ILLNESS:  Randy Arcia. is a 82 y.o. Caucasian male with medical history significant for type diabetes mellitus, hypertension, dyslipidemia, hypothyroidism and osteoarthritis, who presented to the emergency room with a [place] of unsteady gait and feeling wobbly with subsequent falls that occurred twice in the last 24 hours.  The patient admits to urinary frequency and urgency as well as dysuria.  No cough or wheezing or dyspnea.  No fever or chills.  No nausea or vomiting or abdominal pain or diarrhea.  No chest pain or palpitations.  ED Course: When he came to the ER, vital signs were within normal.  Labs revealed a BUN of 34 with a blood glucose 161 and calcium 8.7, total protein 5.9 albumin 3.3.  CBC was within normal.  UA was positive for UTI. EKG: None.  Imaging: Right shoulder x-ray revealed chronic deformity of the humeral head with large medial osteophyte with no acute fracture or dislocation.  Noncontrast head CT scan revealed no acute intracranial normality. t showed atrophy with secondary ventriculomegaly and small vessel ischemic disease.  C-spine CT showed no traumatic injury and mild degenerative changes.  The patient was given 1 g of p.o. Tylenol and 1 g of IV Rocephin with 1 L bolus of IV normal saline.  He will be admitted to a medical bed for further evaluation and management. PAST MEDICAL HISTORY:   Past Medical History:  Diagnosis Date   Anemia    Anxiety    Dementia (HCC)    Depression    recurrent and severe   Diabetes mellitus    Type II with nephropathy   GI bleed    HLD (hyperlipidemia)     Hypertension    Hypothyroidism    Internal hemorrhoid    Osteoarthritis    Personal history of colonic adenoma 12/03/2012    PAST SURGICAL HISTORY:   Past Surgical History:  Procedure Laterality Date   COLONOSCOPY     COLONOSCOPY WITH PROPOFOL N/A 12/20/2016   Procedure: COLONOSCOPY WITH PROPOFOL;  Surgeon: Wyline Mood, MD;  Location: [facility] ENDOSCOPY;  Service: Endoscopy;  Laterality: N/A;   ESOPHAGOGASTRODUODENOSCOPY (EGD) WITH PROPOFOL N/A 12/17/2016   Procedure: ESOPHAGOGASTRODUODENOSCOPY (EGD) WITH PROPOFOL;  Surgeon: Charlott Rakes, MD;  Location: [facility] ENDOSCOPY;  Service: Endoscopy;  Laterality: N/A;   FLEXIBLE SIGMOIDOSCOPY N/A 12/17/2016   Procedure: FLEXIBLE SIGMOIDOSCOPY;  Surgeon: Charlott Rakes, MD;  Location: [facility] ENDOSCOPY;  Service: Endoscopy;  Laterality: N/A;   HEMORRHOID BANDING  04/2017   HEMORRHOID SURGERY N/A 06/14/2017   Procedure: HEMORRHOIDECTOMY;  Surgeon: Kieth Brightly, MD;  Location: [facility] ORS;  Service: General;  Laterality: N/A;   HERNIA REPAIR     JOINT REPLACEMENT Bilateral    PROCTOSCOPY N/A 06/14/2017   Procedure: PROCTOSCOPY;  Surgeon: Kieth Brightly, MD;  Location: [facility] ORS;  Service: General;  Laterality: N/A;   TOTAL KNEE ARTHROPLASTY  11/09   Right-Dr. Remer Macho ([facility])   TOTAL KNEE ARTHROPLASTY  6/10   Left-Dr. Remer Macho ([facility])   UMBILICAL HERNIA REPAIR  2000    SOCIAL HISTORY:   Social History   Tobacco Use  Smoking status: Never    Passive exposure: Past   Smokeless tobacco: Former    Types: Chew    Quit date: 05/15/2002  Substance Use Topics   Alcohol use: No    Alcohol/week: 0.0 standard drinks of alcohol    FAMILY HISTORY:   Family History  Problem Relation Age of Onset   Heart failure Father    Diabetes Father    Depression Father    Coronary artery disease Father    Breast cancer Sister 41   Lung cancer Sister    Depression Sister    Stomach cancer Mother 58   Stroke Other        Paternal side    Parkinsonism Other        Cousin   Colon cancer Neg Hx    Rectal cancer Neg Hx    Esophageal cancer Neg Hx    Colon polyps Neg Hx     DRUG ALLERGIES:   Allergies  Allergen Reactions   Vytorin [Ezetimibe-Simvastatin] Nausea Only    patient nor wife does not remember    REVIEW OF SYSTEMS:   ROS As per history of present illness. All pertinent systems were reviewed above. Constitutional, HEENT, cardiovascular, respiratory, GI, GU, musculoskeletal, neuro, psychiatric, endocrine, integumentary and hematologic systems were reviewed and are otherwise negative/unremarkable except for positive findings mentioned above in the HPI.   MEDICATIONS AT HOME:   Prior to Admission medications   Medication Sig Start Date End Date Taking? Authorizing Provider  glipiZIDE (GLUCOTROL) 5 MG tablet Take 1 tablet (5 mg total) by mouth daily before breakfast. 04/13/23  Yes Karie Schwalbe, MD  levothyroxine (SYNTHROID) 75 MCG tablet TAKE ONE TABLET BY MOUTH ONCE DAILY 01/16/23  Yes Karie Schwalbe, MD  acetaminophen (TYLENOL) 650 MG CR tablet Take 650 mg by mouth every 8 (eight) hours as needed for pain.  Patient not taking: Reported on 07/05/2023    [provider]  escitalopram (LEXAPRO) 20 MG tablet Take 1 tablet (20 mg total) by mouth daily. Patient not taking: Reported on 07/05/2023 03/06/23   Tillman Abide I, MD  polyethylene glycol (MIRALAX / GLYCOLAX) 17 g packet Take 17 g by mouth daily as needed. Patient not taking: Reported on 07/05/2023 03/06/23   Karie Schwalbe, MD  senna-docusate (SENOKOT-S) 8.6-50 MG tablet Take 1 tablet by mouth 2 (two) times daily. Patient not taking: Reported on 07/05/2023 02/13/23   Esaw Grandchild A, DO  tamsulosin (FLOMAX) 0.4 MG CAPS capsule Take 2 capsules (0.8 mg total) by mouth daily. Patient not taking: Reported on 07/05/2023 03/21/23   Karie Schwalbe, MD  vitamin B-12 (CYANOCOBALAMIN) 1000 MCG tablet Take 1,000 mcg by mouth daily. Patient not taking:  Reported on 07/05/2023    [provider]  Zinc Oxide (TRIPLE PASTE) 12.8 % ointment Apply 1 Application topically. Every shift Patient not taking: Reported on 07/05/2023    [provider]      VITAL SIGNS:  Blood pressure (!) 124/57, pulse 67, temperature 97.9 F (36.6 C), temperature source Oral, resp. rate 14, height 5\' 9"  (1.753 m), weight 77.1 kg, SpO2 98%.  PHYSICAL EXAMINATION:  Physical Exam  GENERAL:  82 y.o.-year-old Caucasian male patient lying in the bed with no acute distress.  EYES: Pupils equal, round, reactive to light and accommodation. No scleral icterus. Extraocular muscles intact.  HEENT: Head atraumatic, normocephalic. Oropharynx and nasopharynx clear.  NECK:  Supple, no jugular venous distention. No thyroid enlargement, no tenderness.  LUNGS: Normal breath sounds  bilaterally, no wheezing, rales,rhonchi or crepitation. No use of accessory muscles of respiration.  CARDIOVASCULAR: Regular rate and rhythm, S1, S2 normal. No murmurs, rubs, or gallops.  ABDOMEN: Soft, nondistended, nontender. Bowel sounds present. No organomegaly or mass.  EXTREMITIES: No pedal edema, cyanosis, or clubbing.  NEUROLOGIC: Cranial nerves II through XII are intact. Muscle strength 5/5 in all extremities. Sensation intact. Gait not checked.  PSYCHIATRIC: The patient is alert and oriented x 3.  Normal affect and good eye contact. SKIN: No obvious rash, lesion, or ulcer.   LABORATORY PANEL:   CBC Recent Labs  Lab 07/05/23 0514  WBC 6.7  HGB 12.7*  HCT 39.2  PLT 277   ------------------------------------------------------------------------------------------------------------------  Chemistries  Recent Labs  Lab 07/05/23 0258 07/05/23 0514  NA 136 139  K 4.0 3.8  CL 104 107  CO2 27 26  GLUCOSE 161* 128*  BUN 34* 30*  CREATININE 1.13 0.88  CALCIUM 8.7* 8.7*  AST 22  --   ALT 16  --   ALKPHOS 63  --   BILITOT 0.7  --     ------------------------------------------------------------------------------------------------------------------  Cardiac Enzymes No results for input(s): "TROPONINI" in the last 168 hours. ------------------------------------------------------------------------------------------------------------------  RADIOLOGY:  DG Shoulder Right  Result Date: 07/05/2023 CLINICAL DATA:  Fall EXAM: RIGHT SHOULDER - 2+ VIEW COMPARISON:  None Available. FINDINGS: Chronic deformity of the humeral head with large medial osteophyte. Degenerative changes of the glenoid fossa. No acute fracture or dislocation. IMPRESSION: 1. No acute fracture or dislocation. 2. Chronic deformity of the humeral head with large medial osteophyte. Electronically Signed   By: Deatra Robinson M.D.   On: 07/05/2023 02:46   CT HEAD WO CONTRAST ( )  Result Date: 07/05/2023 CLINICAL DATA:  Trauma/fall, failure to thrive EXAM: CT HEAD WITHOUT CONTRAST CT CERVICAL SPINE WITHOUT CONTRAST TECHNIQUE: Multidetector CT imaging of the head and cervical spine was performed following the standard protocol without intravenous contrast. Multiplanar CT image reconstructions of the cervical spine were also generated. RADIATION DOSE REDUCTION: This exam was performed according to the departmental dose-optimization program which includes automated exposure control, adjustment of the mA and/or kV according to patient size and/or use of iterative reconstruction technique. COMPARISON:  CT head dated 02/07/2023 FINDINGS: CT HEAD FINDINGS Brain: No evidence of acute infarction, hemorrhage, hydrocephalus, extra-axial collection or mass lesion/mass effect. Global cortical and central atrophy.  Stable ventricular prominence. Subcortical white matter and periventricular small vessel ischemic changes. Vascular: Intracranial atherosclerosis. Skull: Normal. Negative for fracture or focal lesion. Sinuses/Orbits: The visualized paranasal sinuses are essentially clear. The  mastoid air cells are unopacified. Other: None. CT CERVICAL SPINE FINDINGS Alignment: Straightening of the cervical spine, likely degenerative. Skull base and vertebrae: No acute fracture. No primary bone lesion or focal pathologic process. Soft tissues and spinal canal: No prevertebral fluid or swelling. No visible canal hematoma. Disc levels: Mild degenerative changes, most prominent at C5-6. Spinal canal is patent. Upper chest: Visualized lung apices are clear. Other: Visualized thyroid is unremarkable. IMPRESSION: No acute intracranial abnormality. Atrophy with secondary ventriculomegaly. Small vessel ischemic changes. No traumatic injury to the cervical spine. Mild degenerative changes. Electronically Signed   By: Charline Bills M.D.   On: 07/05/2023 02:20   CT Cervical Spine Wo Contrast  Result Date: 07/05/2023 CLINICAL DATA:  Trauma/fall, failure to thrive EXAM: CT HEAD WITHOUT CONTRAST CT CERVICAL SPINE WITHOUT CONTRAST TECHNIQUE: Multidetector CT imaging of the head and cervical spine was performed following the standard protocol without intravenous contrast. Multiplanar CT image reconstructions of the  cervical spine were also generated. RADIATION DOSE REDUCTION: This exam was performed according to the departmental dose-optimization program which includes automated exposure control, adjustment of the mA and/or kV according to patient size and/or use of iterative reconstruction technique. COMPARISON:  CT head dated 02/07/2023 FINDINGS: CT HEAD FINDINGS Brain: No evidence of acute infarction, hemorrhage, hydrocephalus, extra-axial collection or mass lesion/mass effect. Global cortical and central atrophy.  Stable ventricular prominence. Subcortical white matter and periventricular small vessel ischemic changes. Vascular: Intracranial atherosclerosis. Skull: Normal. Negative for fracture or focal lesion. Sinuses/Orbits: The visualized paranasal sinuses are essentially clear. The mastoid air cells are  unopacified. Other: None. CT CERVICAL SPINE FINDINGS Alignment: Straightening of the cervical spine, likely degenerative. Skull base and vertebrae: No acute fracture. No primary bone lesion or focal pathologic process. Soft tissues and spinal canal: No prevertebral fluid or swelling. No visible canal hematoma. Disc levels: Mild degenerative changes, most prominent at C5-6. Spinal canal is patent. Upper chest: Visualized lung apices are clear. Other: Visualized thyroid is unremarkable. IMPRESSION: No acute intracranial abnormality. Atrophy with secondary ventriculomegaly. Small vessel ischemic changes. No traumatic injury to the cervical spine. Mild degenerative changes. Electronically Signed   By: Charline Bills M.D.   On: 07/05/2023 02:20      IMPRESSION AND PLAN:  Assessment and Plan: * Recurrent falls - The patient be admitted to a medical-surgical bed. - PT consult to be obtained to assess his LV. - This could be partly related to generalized weakness associated with acute UTI.  Acute lower UTI - Will continue antibiotic therapy with IV Rocephin. - Will follow urine culture.  Type 2 diabetes mellitus with peripheral neuropathy (HCC) - The patient will be placed on supplemental coverage with NovoLog. - We will hold off metformin and resume Glucotrol. - We will continue Neurontin.  Dyslipidemia - We will continue statin therapy.  BPH (benign prostatic hyperplasia) - We will continue Flomax  Depression - We will continue Lexapro and Zyprexa.  Hypothyroidism - We will continue Synthroid.    DVT prophylaxis: Lovenox.  Advanced Care Planning:  Code Status: Patient is DNR and DNI. Family Communication:  The plan of care was discussed in details with the patient (and family). I answered all questions. The patient agreed to proceed with the above mentioned plan. Further management will depend upon hospital course. Disposition Plan: Back to previous home environment Consults  called: none. All the records are reviewed and case discussed with ED provider.  Status is: Inpatient   At the time of the admission, it appears that the appropriate admission status for this patient is inpatient.  This is judged to be reasonable and necessary in order to provide the required intensity of service to ensure the patient's safety given the presenting symptoms, physical exam findings and initial radiographic and laboratory data in the context of comorbid conditions.  The patient requires inpatient status due to high intensity of service, high risk of further deterioration and high frequency of surveillance required.  I certify that at the time of admission, it is my clinical judgment that the patient will require inpatient hospital care extending more than 2 midnights.                            Dispo: The patient is from: Home              Anticipated d/c is to: Home  Patient currently is not medically stable to d/c.              Difficult to place patient: No  Hannah Beat M.D on 07/05/2023 at 7:21 AM  [facility]   From 7 PM-7 AM, contact night-coverage [email or web address]  CC: Primary care physician; Karie Schwalbe, MD

## 2023-07-05 NOTE — ED Notes (Signed)
Patient assisted with urinal at this time.  

## 2023-07-05 NOTE — Assessment & Plan Note (Signed)
-   We will continue Synthroid. 

## 2023-07-05 NOTE — Assessment & Plan Note (Addendum)
-   We will continue Lexapro and Zyprexa.

## 2023-07-05 NOTE — ED Provider Notes (Signed)
[facility] Inpatient Provider Note    Event Date/Time   First MD Initiated Contact with Patient 07/05/23 0153     (approximate)   History   Fall   HPI  Randy Mcdaniel. is a 82 y.o. male history of dementia, hypertension, diabetes, hyperlipidemia, hypothyroidism, vascular parkinsonism, failure to thrive currently on hospice who presents to the emergency department with 2 falls in the past 24 hours that were unwitnessed.  Patient is complaining of right shoulder pain.  Wife denies any recent fevers, cough, vomiting or diarrhea.  She states he does not eat and drink well but this has been ongoing for months and this is why he is on hospice.   History provided by patient but level 5 caveat secondary dementia.  Also spoke with patient's wife by phone.    Past Medical History:  Diagnosis Date   Anemia    Anxiety    Dementia (HCC)    Depression    recurrent and severe   Diabetes mellitus    Type II with nephropathy   GI bleed    HLD (hyperlipidemia)    Hypertension    Hypothyroidism    Internal hemorrhoid    Osteoarthritis    Personal history of colonic adenoma 12/03/2012    Past Surgical History:  Procedure Laterality Date   COLONOSCOPY     COLONOSCOPY WITH PROPOFOL N/A 12/20/2016   Procedure: COLONOSCOPY WITH PROPOFOL;  Surgeon: Wyline Mood, MD;  Location: [facility] ENDOSCOPY;  Service: Endoscopy;  Laterality: N/A;   ESOPHAGOGASTRODUODENOSCOPY (EGD) WITH PROPOFOL N/A 12/17/2016   Procedure: ESOPHAGOGASTRODUODENOSCOPY (EGD) WITH PROPOFOL;  Surgeon: Charlott Rakes, MD;  Location: [facility] ENDOSCOPY;  Service: Endoscopy;  Laterality: N/A;   FLEXIBLE SIGMOIDOSCOPY N/A 12/17/2016   Procedure: FLEXIBLE SIGMOIDOSCOPY;  Surgeon: Charlott Rakes, MD;  Location: [facility] ENDOSCOPY;  Service: Endoscopy;  Laterality: N/A;   HEMORRHOID BANDING  04/2017   HEMORRHOID SURGERY N/A 06/14/2017   Procedure: HEMORRHOIDECTOMY;  Surgeon: Kieth Brightly, MD;  Location: [facility] ORS;   Service: General;  Laterality: N/A;   HERNIA REPAIR     JOINT REPLACEMENT Bilateral    PROCTOSCOPY N/A 06/14/2017   Procedure: PROCTOSCOPY;  Surgeon: Kieth Brightly, MD;  Location: [facility] ORS;  Service: General;  Laterality: N/A;   TOTAL KNEE ARTHROPLASTY  11/09   Right-Dr. Remer Macho ([facility])   TOTAL KNEE ARTHROPLASTY  6/10   Left-Dr. Remer Macho ([facility])   UMBILICAL HERNIA REPAIR  2000    MEDICATIONS:  Prior to Admission medications   Medication Sig Start Date End Date Taking? Authorizing Provider  acetaminophen (TYLENOL) 650 MG CR tablet Take 650 mg by mouth every 8 (eight) hours as needed for pain.     [provider]  escitalopram (LEXAPRO) 20 MG tablet Take 1 tablet (20 mg total) by mouth daily. 03/06/23   Karie Schwalbe, MD  glipiZIDE (GLUCOTROL) 5 MG tablet Take 1 tablet (5 mg total) by mouth daily before breakfast. 04/13/23   Karie Schwalbe, MD  levothyroxine (SYNTHROID) 75 MCG tablet TAKE ONE TABLET BY MOUTH ONCE DAILY 01/16/23   Karie Schwalbe, MD  polyethylene glycol (MIRALAX / GLYCOLAX) 17 g packet Take 17 g by mouth daily as needed. 03/06/23   Karie Schwalbe, MD  senna-docusate (SENOKOT-S) 8.6-50 MG tablet Take 1 tablet by mouth 2 (two) times daily. 02/13/23   Pennie Banter, DO  tamsulosin (FLOMAX) 0.4 MG CAPS capsule Take 2 capsules (0.8 mg total) by mouth daily. 03/21/23   Karie Schwalbe, MD  vitamin B-12 (CYANOCOBALAMIN) 1000 MCG tablet Take 1,000 mcg by mouth daily.    [provider]  Zinc Oxide (TRIPLE PASTE) 12.8 % ointment Apply 1 Application topically. Every shift    [provider]    Physical Exam   Triage Vital Signs: ED Triage Vitals  Encounter Vitals Group     BP 07/05/23 0201 (!) 117/58     Systolic BP Percentile --      Diastolic BP Percentile --      Pulse Rate 07/05/23 0201 (!) 58     Resp 07/05/23 0201 16     Temp 07/05/23 0201 97.9 F (36.6 C)     Temp Source 07/05/23 0201 Oral     SpO2 07/05/23 0201 94 %      Weight 07/05/23 0202 170 lb (77.1 kg)     Height 07/05/23 0202 5\' 9"  (1.753 m)     Head Circumference --      Peak Flow --      Pain Score 07/05/23 0157 5     Pain Loc --      Pain Education --      Exclude from Growth Chart --      Most recent vital signs: Vitals:   07/05/23 0201  BP: (!) 117/58  Pulse: (!) 58  Resp: 16  Temp: 97.9 F (36.6 C)  SpO2: 94%     CONSTITUTIONAL: Alert, responds appropriately to some questions.  Oriented to person and place but not time.  Elderly. HEAD: Normocephalic; atraumatic EYES: Conjunctivae clear, PERRL, EOMI ENT: normal nose; no rhinorrhea; moist mucous membranes; pharynx without lesions noted; no dental injury; no septal hematoma, no epistaxis; no facial deformity or bony tenderness NECK: Supple, no midline spinal tenderness, step-off or deformity; trachea midline CARD: RRR; S1 and S2 appreciated; no murmurs, no clicks, no rubs, no gallops RESP: Normal chest excursion without splinting or tachypnea; breath sounds clear and equal bilaterally; no wheezes, no rhonchi, no rales; no hypoxia or respiratory distress CHEST:  chest wall stable, no crepitus or ecchymosis or deformity, nontender to palpation; no flail chest ABD/GI: Non-distended; soft, non-tender, no rebound, no guarding; no ecchymosis or other lesions noted PELVIS:  stable, nontender to palpation BACK:  The back appears normal; no midline spinal tenderness, step-off or deformity EXT: Tender to palpation over the right shoulder without deformity.  Normal ROM in all joints; no edema; normal capillary refill; no cyanosis, no bony tenderness or bony deformity of patient's extremities, no joint effusions, compartments are soft, extremities are warm and well-perfused, no ecchymosis SKIN: Normal color for age and race; warm NEURO: No facial asymmetry, normal speech, moving all extremities equally  ED Results / Procedures / Treatments   LABS: (all labs ordered are listed, but only abnormal  results are displayed) Labs Reviewed  COMPREHENSIVE METABOLIC PANEL - Abnormal; Notable for the following components:      Result Value   Glucose, Bld 161 (*)    BUN 34 (*)    Calcium 8.7 (*)    Total Protein 5.9 (*)    Albumin 3.3 (*)    All other components within normal limits  URINALYSIS, ROUTINE W REFLEX MICROSCOPIC - Abnormal; Notable for the following components:   Color, Urine YELLOW (*)    APPearance CLOUDY (*)    Glucose, UA 50 (*)    Hgb urine dipstick MODERATE (*)    Protein, ur 30 (*)    Leukocytes,Ua LARGE (*)    Bacteria, UA RARE (*)  All other components within normal limits  URINE CULTURE  CBC WITH DIFFERENTIAL/PLATELET     EKG:   RADIOLOGY: My personal review and interpretation of imaging: CT scans, x-ray showed no acute abnormality.  I have personally reviewed all radiology reports. DG Shoulder Right  Result Date: 07/05/2023 CLINICAL DATA:  Fall EXAM: RIGHT SHOULDER - 2+ VIEW COMPARISON:  None Available. FINDINGS: Chronic deformity of the humeral head with large medial osteophyte. Degenerative changes of the glenoid fossa. No acute fracture or dislocation. IMPRESSION: 1. No acute fracture or dislocation. 2. Chronic deformity of the humeral head with large medial osteophyte. Electronically Signed   By: Deatra Robinson M.D.   On: 07/05/2023 02:46   CT HEAD WO CONTRAST ( )  Result Date: 07/05/2023 CLINICAL DATA:  Trauma/fall, failure to thrive EXAM: CT HEAD WITHOUT CONTRAST CT CERVICAL SPINE WITHOUT CONTRAST TECHNIQUE: Multidetector CT imaging of the head and cervical spine was performed following the standard protocol without intravenous contrast. Multiplanar CT image reconstructions of the cervical spine were also generated. RADIATION DOSE REDUCTION: This exam was performed according to the departmental dose-optimization program which includes automated exposure control, adjustment of the mA and/or kV according to patient size and/or use of iterative  reconstruction technique. COMPARISON:  CT head dated 02/07/2023 FINDINGS: CT HEAD FINDINGS Brain: No evidence of acute infarction, hemorrhage, hydrocephalus, extra-axial collection or mass lesion/mass effect. Global cortical and central atrophy.  Stable ventricular prominence. Subcortical white matter and periventricular small vessel ischemic changes. Vascular: Intracranial atherosclerosis. Skull: Normal. Negative for fracture or focal lesion. Sinuses/Orbits: The visualized paranasal sinuses are essentially clear. The mastoid air cells are unopacified. Other: None. CT CERVICAL SPINE FINDINGS Alignment: Straightening of the cervical spine, likely degenerative. Skull base and vertebrae: No acute fracture. No primary bone lesion or focal pathologic process. Soft tissues and spinal canal: No prevertebral fluid or swelling. No visible canal hematoma. Disc levels: Mild degenerative changes, most prominent at C5-6. Spinal canal is patent. Upper chest: Visualized lung apices are clear. Other: Visualized thyroid is unremarkable. IMPRESSION: No acute intracranial abnormality. Atrophy with secondary ventriculomegaly. Small vessel ischemic changes. No traumatic injury to the cervical spine. Mild degenerative changes. Electronically Signed   By: Charline Bills M.D.   On: 07/05/2023 02:20   CT Cervical Spine Wo Contrast  Result Date: 07/05/2023 CLINICAL DATA:  Trauma/fall, failure to thrive EXAM: CT HEAD WITHOUT CONTRAST CT CERVICAL SPINE WITHOUT CONTRAST TECHNIQUE: Multidetector CT imaging of the head and cervical spine was performed following the standard protocol without intravenous contrast. Multiplanar CT image reconstructions of the cervical spine were also generated. RADIATION DOSE REDUCTION: This exam was performed according to the departmental dose-optimization program which includes automated exposure control, adjustment of the mA and/or kV according to patient size and/or use of iterative reconstruction  technique. COMPARISON:  CT head dated 02/07/2023 FINDINGS: CT HEAD FINDINGS Brain: No evidence of acute infarction, hemorrhage, hydrocephalus, extra-axial collection or mass lesion/mass effect. Global cortical and central atrophy.  Stable ventricular prominence. Subcortical white matter and periventricular small vessel ischemic changes. Vascular: Intracranial atherosclerosis. Skull: Normal. Negative for fracture or focal lesion. Sinuses/Orbits: The visualized paranasal sinuses are essentially clear. The mastoid air cells are unopacified. Other: None. CT CERVICAL SPINE FINDINGS Alignment: Straightening of the cervical spine, likely degenerative. Skull base and vertebrae: No acute fracture. No primary bone lesion or focal pathologic process. Soft tissues and spinal canal: No prevertebral fluid or swelling. No visible canal hematoma. Disc levels: Mild degenerative changes, most prominent at C5-6. Spinal canal is patent. Upper  chest: Visualized lung apices are clear. Other: Visualized thyroid is unremarkable. IMPRESSION: No acute intracranial abnormality. Atrophy with secondary ventriculomegaly. Small vessel ischemic changes. No traumatic injury to the cervical spine. Mild degenerative changes. Electronically Signed   By: Charline Bills M.D.   On: 07/05/2023 02:20     PROCEDURES:  Critical Care performed: No     Procedures    IMPRESSION / MDM / ASSESSMENT AND PLAN / ED COURSE  I reviewed the triage vital signs and the nursing notes.  Patient here after 2 falls in 24 hours.  On hospice for failure to thrive.    DIFFERENTIAL DIAGNOSIS (includes but not limited to):   Intracranial hemorrhage, skull fracture, cervical spine fracture, shoulder fracture, shoulder contusion, dehydration, anemia, electrolyte derangement, UTI  Patient's presentation is most consistent with acute presentation with potential threat to life or bodily function.  PLAN: Discussed with wife for goals of care.  Even though  patient is on hospice, she states that this time she would want full workup including imaging, labs and urine.  She states she would like for [place] to give him IV fluids as well given he is not eating and drinking well which is chronic for him and why he is on hospice.   MEDICATIONS GIVEN IN ED: Medications  cefTRIAXone (ROCEPHIN) 1 g in sodium chloride 0.9 % 100 mL IVPB (has no administration in time range)  acetaminophen (TYLENOL) tablet 1,000 mg (1,000 mg Oral Given 07/05/23 0304)  sodium chloride 0.9 % bolus 1,000 mL (0 mLs Intravenous Stopped 07/05/23 0339)     ED COURSE: CTs and x-rays reviewed and interpreted by myself and the radiologist and showed no acute abnormality.  Labs show normal hemoglobin, creatinine, electrolytes, glucose.   4:10 AM  Pt's urine appears infected today.  Will obtain culture.  Will give Rocephin.  No signs or symptoms of sepsis.  Discussed with wife by phone that this could cause weakness and falls.  We discussed admission versus going home with oral antibiotics.  She would prefer admission to the hospital.  Will discuss with hospitalist.  CONSULTS:  Consulted and discussed patient's case with hospitalist, Dr. Arville Care.  I have recommended admission and consulting physician agrees and will place admission orders.  Patient (and family if present) agree with this plan.   I reviewed all nursing notes, vitals, pertinent previous records.  All labs, EKGs, imaging ordered have been independently reviewed and interpreted by myself.    OUTSIDE RECORDS REVIEWED: Reviewed last PCP note on 06/21/2023.       FINAL CLINICAL IMPRESSION(S) / ED DIAGNOSES   Final diagnoses:  Multiple falls  Acute UTI     Rx / DC Orders   ED Discharge Orders     None        Note:  This document was prepared using Dragon voice recognition software and may include unintentional dictation errors.   Lena Fieldhouse, Layla Maw, DO 07/05/23 [phone]

## 2023-07-05 NOTE — Care Management Obs Status (Signed)
MEDICARE OBSERVATION STATUS NOTIFICATION   Patient Details  Name: Randy Mcdaniel. MRN: [id number] Date of Birth: 03-Dec-1940   Medicare Observation Status Notification Given:  Yes    Darolyn Rua, LCSW 07/05/2023, 3:56 PM

## 2023-07-05 NOTE — Evaluation (Signed)
Physical Therapy Evaluation Patient Details Name: Randy Mcdaniel. MRN: [id number] DOB: 1940/11/29 Today's Date: 07/05/2023  History of Present Illness  Pt is a 82 y.o. male with medical history significant for type diabetes mellitus, hypertension, dyslipidemia, hypothyroidism and osteoarthritis, who presented to the ED on 07/05/23 with a [place] of unsteady gait and feeling wobbly with subsequent falls that occurred twice in the last 24 hours. The patient admits to urinary frequency and urgency as well as dysuria. No cough or wheezing or dyspnea.  No fever or chills. No nausea or vomiting or abdominal pain or diarrhea. No chest pain or palpitations.  Clinical Impression  Pt is received in bed with spouse at bedside, he is initially resistant to PT evaluation but with encouragement agreeable. At baseline, Pt reports use of RW for limited household amb, history of falls in last 6 months with wife adding approx 3 falls, and requires assist from wife or aides for bathing/grooming and completion of IADLs. Pt performs bed mobility and transfers minA for safety. Pt endorses increased pain when attempted to sit EOB towards R side and decreased pain when sitting towards L side. Pt requires minA for trunk support and cuing for hand placement on railings to assist in facilitation of task. Pt refusing to use RW due to R shoulder pain so performed x3 STS minA with 1 hand held assist on L side and demonstrates notable unsteadiness and generalized weakness. Unable to further assess mobility at this time secondary to Pt refusal. Educated Pt on benefits of PT to promote BLE strengthen-Pt acknowledged understanding. Pt would benefit from skilled PT to address above deficits and promote optimal return to PLOF.        If plan is discharge home, recommend the following: A little help with walking and/or transfers;A little help with bathing/dressing/bathroom;Direct supervision/assist for medications management;Assist for  transportation;Help with stairs or ramp for entrance;Supervision due to cognitive status   Can travel by private vehicle        Equipment Recommendations None recommended by PT  Recommendations for Other Services       Functional Status Assessment Patient has had a recent decline in their functional status and demonstrates the ability to make significant improvements in function in a reasonable and predictable amount of time.     Precautions / Restrictions Precautions Precautions: Fall Restrictions Weight Bearing Restrictions: No      Mobility  Bed Mobility Overal bed mobility: Needs Assistance Bed Mobility: Supine to Sit, Sit to Supine     Supine to sit: Min assist, HOB elevated, Used rails Sit to supine: Contact guard assist, HOB elevated   General bed mobility comments: Pt able to perform bed mobility CGA-minA for safety; Pt requires assist with trunk control during transition due to R shoulder pain/discomfort; slight cuing required for use of railings to facilitate mobility    Transfers Overall transfer level: Needs assistance Equipment used: 1 person hand held assist Transfers: Sit to/from Stand Sit to Stand: Min assist           General transfer comment: Pt able to perform x3 STS with 1 HHA on L side and foot block to prevent feet from anterior shift; Pt able to increase standing tolerance slightly with repetitions although Pt reports "I can't stand for a long time"    Ambulation/Gait               General Gait Details: Deferred at this time secondary to Pt refusing and reports of L shoulder pain  Stairs            Wheelchair Mobility     Tilt Bed    Modified Rankin (Stroke Patients Only)       Balance Overall balance assessment: Needs assistance Sitting-balance support: Single extremity supported, Feet supported Sitting balance-Leahy Scale: Good Sitting balance - Comments: Pt able to maintain seated EOB balance during functional  activities without LOB noted   Standing balance support: Single extremity supported, During functional activity, Reliant on assistive device for balance Standing balance-Leahy Scale: Fair Standing balance comment: slight posterior lean during static standing which Pt able to correct following tactile cuing; heavy reliance on UE support possibly secondary to generalized weakness                             Pertinent Vitals/Pain Pain Assessment Pain Assessment: 0-10 Pain Score: 4  Pain Location: R shoulder Pain Descriptors / Indicators: Aching, Guarding, Sore Pain Intervention(s): Limited activity within patient's tolerance    Home Living Family/patient expects to be discharged to:: Private residence Living Arrangements: Spouse/significant other Available Help at Discharge: Family;Personal care attendant (aide comes 3x/week for ~45 minutes, HH RN comes 1x/week for ~1 hour) Type of Home: Apartment Home Access: Stairs to enter   [employer] of Steps: 1 small threshold   Home Layout: One level Home Equipment: Agricultural consultant (2 wheels);Grab bars - tub/shower Additional Comments: wife reports someone is always with Pt    Prior Function Prior Level of Function : Needs assist;History of Falls (last six months)       Physical Assist : ADLs (physical);Mobility (physical) Mobility (physical): Gait ADLs (physical): Grooming;Bathing;Dressing;IADLs Mobility Comments: Pt uses RW at baseline ADLs Comments: wife reports aides assist with grooming/bathing and IADLs     Extremity/Trunk Assessment   Upper Extremity Assessment Upper Extremity Assessment: Overall WFL for tasks assessed    Lower Extremity Assessment Lower Extremity Assessment: Generalized weakness       Communication   Communication Communication: Hearing impairment Cueing Techniques: Verbal cues;Tactile cues  Cognition Arousal: Alert Behavior During Therapy: WFL for tasks  assessed/performed Overall Cognitive Status: Within Functional Limits for tasks assessed                                 General Comments: Apprehensive about work with PT due to not wanting to increase R shoulder pain; Pt adds "there is nothing PT will do for me"        General Comments General comments (skin integrity, edema, etc.): VSS monitored throughout and maintained WNL    Exercises Other Exercises Other Exercises: Edu Pt on PT role and benefits behind participation and promoting BLE strength-Pt acknowledged understanding   Assessment/Plan    PT Assessment Patient needs continued PT services  PT Problem List Decreased strength;Decreased cognition;Decreased range of motion;Decreased activity tolerance;Decreased balance;Decreased mobility;Decreased coordination       PT Treatment Interventions DME instruction;Neuromuscular re-education;Gait training;Functional mobility training;Therapeutic activities;Therapeutic exercise;Balance training    PT Goals (Current goals can be found in the Care Plan section)  Acute Rehab PT Goals Patient Stated Goal: to go home and reduce R shoulder pain PT Goal Formulation: With patient Time For Goal Achievement: 07/19/23 Potential to Achieve Goals: Fair    Frequency Min 1X/week     Co-evaluation               AM-PAC PT "6 Clicks" Mobility  Outcome  Measure Help needed turning from your back to your side while in a flat bed without using bedrails?: A Little Help needed moving from lying on your back to sitting on the side of a flat bed without using bedrails?: A Little Help needed moving to and from a bed to a chair (including a wheelchair)?: A Little Help needed standing up from a chair using your arms (e.g., wheelchair or bedside chair)?: A Little Help needed to walk in hospital room?: A Little Help needed climbing 3-5 steps with a railing? : A Lot 6 Click Score: 17    End of Session Equipment Utilized During  Treatment: Gait belt Activity Tolerance: Treatment limited secondary to agitation;Patient limited by pain Patient left: in bed;with call bell/phone within reach;with bed alarm set;with family/visitor present Nurse Communication: Mobility status PT Visit Diagnosis: Unsteadiness on feet (R26.81);Difficulty in walking, not elsewhere classified (R26.2);Muscle weakness (generalized) (M62.81);History of falling (Z91.81);Repeated falls (R29.6);Pain Pain - Right/Left: Right Pain - part of body: Shoulder    Time: 1435-1500 PT Time Calculation (min) (ACUTE ONLY): 25 min   Charges:                 Elmon Else, SPT   Klare Criss 07/05/2023, 3:26 PM

## 2023-07-05 NOTE — ED Notes (Signed)
Patient incontinent of urine; linens changed, new depends and chucks applied.

## 2023-07-05 NOTE — ED Notes (Signed)
This nurse contacted hospice as directed by the notification on Epic.  Darcella Cheshire with hospice stated that she has spoken with the patient's wife, and they have agreed to allow [facility] to perform tests and scans to assess the patient, and then they think that the hospital liaison with help them come up with a plan of care in the morning.

## 2023-07-05 NOTE — ED Notes (Signed)
[facility] ED Tech Notified Nurse Lillia Abed of HR 50

## 2023-07-05 NOTE — Assessment & Plan Note (Signed)
-   We will continue Flomax 

## 2023-07-05 NOTE — Assessment & Plan Note (Deleted)
-   Will continue antibiotic therapy with IV Rocephin. - Will follow urine culture.

## 2023-07-05 NOTE — Progress Notes (Signed)
[facility] Patient Visit  Mr. Sulo Janczak is a current hospice patient, with hospice diagnosos of other specified peripheral vascular disease. He was admitted 11.6.24 with diagnosis of UTI and falls. [facility] was notified of fall at home and that patient is enroute to ED by EMS by family. Per Dr. Patric Dykes, hospice physician, this is a related hospital admission.   Visited with patient in hospital. He is alert and able to recount recent events. No current complaint. Reports recent increase in weakness and falls but cannot describe when this started. Contacted spouse by phone and discussed current status.   Patient is GIP appropriate due to need for IV antibiotics, IV fluids, and evaluation for potential causes of recurrent falls.  Vital Signs: 97.7/53/19      137/72    O2 99% on RA  I&O:  not documented/400  Abnormal labs: BUN 30,34, Ca 8.7, Alb 3.3, HGB 12.7, UA positive for infection. Urine Culture pending  Diagnostics: CT cervical spine and head, and Right shoulder xray, all without significant findings.   IV/PRN Meds:  Rocephin 1G IV x 1, NS IV x 1  Problem List:  Recurrent falls - The patient be admitted to a medical-surgical bed. - PT consult to be obtained to assess his LV. - This could be partly related to generalized weakness associated with acute UTI.   Acute lower UTI - Will continue antibiotic therapy with IV Rocephin. - Will follow urine culture.   Discharge Planning: Ongoing  Family Contact: Communicated with spouse by phone.  IDT: Updated  Goals of Care: DNR  Should patient need ambulance transfer at discharge- please use [facility]) as they contract this service for our active hospice patients.  Glenna Fellows BSN, RN, [facility] hospital liaison [phone]

## 2023-07-05 NOTE — Assessment & Plan Note (Signed)
-   Will continue antibiotic therapy with IV Rocephin. - Will follow urine culture.

## 2023-07-05 NOTE — ED Triage Notes (Signed)
Patient arrives by [facility] from home after the second of two falls in a 24 hour period.  Patient is a hospice patient due to failure to thrive.  Patient slid down to the floor when trying to get out of a recliner.  He denies pain.

## 2023-07-05 NOTE — Progress Notes (Signed)
Per spouse patient is active [facility] patient and would like to resume at home at discharge, she will transport home.   Reports no additional needs, code 44 provided.   Darolyn Rua, [place], MSW, [place] [phone]

## 2023-07-05 NOTE — Assessment & Plan Note (Signed)
-   The patient will be placed on supplemental coverage with NovoLog. - We will hold off metformin and resume Glucotrol. - We will continue Neurontin.

## 2023-07-05 NOTE — Progress Notes (Signed)
[facility] Liaison note:   This patient is a current [facility] patient. [facility] will continue to follow for discharge disposition.    Please call for any Hospice  related questions or concerns.   [facility] Liaison [phone]

## 2023-07-05 NOTE — Care Management CC44 (Signed)
Condition Code 44 Documentation Completed  Patient Details  Name: Randy Mcdaniel. MRN: [id number] Date of Birth: 02-Apr-1941   Condition Code 44 given:  Yes Patient signature on Condition Code 44 notice:  Yes Documentation of 2 MD's agreement:  Yes Code 44 added to claim:  Yes    Darolyn Rua, LCSW 07/05/2023, 3:56 PM

## 2023-07-05 NOTE — Assessment & Plan Note (Signed)
-   We will continue statin therapy. 

## 2023-07-06 DIAGNOSIS — R296 Repeated falls: Secondary | ICD-10-CM | POA: Diagnosis not present

## 2023-07-06 LAB — CBC WITH DIFFERENTIAL/PLATELET
Abs Immature Granulocytes: 0.03 10*3/uL (ref 0.00–0.07)
Basophils Absolute: 0.1 10*3/uL (ref 0.0–0.1)
Basophils Relative: 1 %
Eosinophils Absolute: 0.1 10*3/uL (ref 0.0–0.5)
Eosinophils Relative: 2 %
HCT: 41.7 % (ref 39.0–52.0)
Hemoglobin: 14 g/dL (ref 13.0–17.0)
Immature Granulocytes: 0 %
Lymphocytes Relative: 11 %
Lymphs Abs: 0.7 10*3/uL (ref 0.7–4.0)
MCH: 31.1 pg (ref 26.0–34.0)
MCHC: 33.6 g/dL (ref 30.0–36.0)
MCV: 92.7 fL (ref 80.0–100.0)
Monocytes Absolute: 0.5 10*3/uL (ref 0.1–1.0)
Monocytes Relative: 7 %
Neutro Abs: 5.4 10*3/uL (ref 1.7–7.7)
Neutrophils Relative %: 79 %
Platelets: 266 10*3/uL (ref 150–400)
RBC: 4.5 MIL/uL (ref 4.22–5.81)
RDW: 14.4 % (ref 11.5–15.5)
WBC: 6.9 10*3/uL (ref 4.0–10.5)
nRBC: 0 % (ref 0.0–0.2)

## 2023-07-06 LAB — BASIC METABOLIC PANEL
Anion gap: 7 (ref 5–15)
BUN: 21 mg/dL (ref 8–23)
CO2: 24 mmol/L (ref 22–32)
Calcium: 8.9 mg/dL (ref 8.9–10.3)
Chloride: 103 mmol/L (ref 98–111)
Creatinine, Ser: 0.96 mg/dL (ref 0.61–1.24)
GFR, Estimated: >60 mL/min (ref >60)
Glucose, Bld: 140 mg/dL — ABNORMAL HIGH (ref 70–99)
Potassium: 3.7 mmol/L (ref 3.5–5.1)
Sodium: 134 mmol/L — ABNORMAL LOW (ref 135–145)

## 2023-07-06 LAB — URINE CULTURE: Culture: 30000 — AB

## 2023-07-06 MED ORDER — AMOXICILLIN-POT CLAVULANATE 875-125 MG PO TABS: 1.0000 | ORAL_TABLET | Freq: Two times a day (BID) | ORAL | Status: DC

## 2023-07-06 MED ORDER — AMOXICILLIN-POT CLAVULANATE 875-125 MG PO TABS: 1.0000 | ORAL_TABLET | Freq: Two times a day (BID) | ORAL | 0 refills | Status: DC

## 2023-07-06 NOTE — Discharge Summary (Signed)
Physician Discharge Summary   Patient: Randy Mcdaniel. MRN: [id number] DOB: 1941/05/09  Admit date:     07/05/2023  Discharge date: 07/06/23  Discharge Physician: Loyce Dys   PCP: Karie Schwalbe, MD   Recommendations at discharge:  Follow-up with outpatient hospice  Discharge Diagnoses: Recurrent falls Acute lower UTI Type 2 diabetes mellitus with peripheral neuropathy (HCC) Dyslipidemia BPH (benign prostatic hyperplasia) Depression Hypothyroidism  Hospital Course: Nathanal Hermiz. is a 82 y.o. Caucasian male with medical history significant for type diabetes mellitus, hypertension, dyslipidemia, hypothyroidism and osteoarthritis, who presented to the emergency room with a [place] of unsteady gait and feeling wobbly with subsequent falls that occurred twice in the last 24 hours for admission. ED Course: When he came to the ER, vital signs were within normal.  Labs revealed a BUN of 34 with a blood glucose 161 and calcium 8.7, total protein 5.9 albumin 3.3.  CBC was within normal.  UA was positive for UTI. Patient was initiated on antibiotic therapy.  Have not been cleared for discharge today and to follow-up with home hospice   Consultants: None Procedures performed: None Disposition: Home Diet recommendation:  Cardiac diet DISCHARGE MEDICATION: Allergies as of 07/06/2023       Reactions   Vytorin [ezetimibe-simvastatin] Nausea Only   patient nor wife does not remember        Medication List     STOP taking these medications    tamsulosin 0.4 MG Caps capsule Commonly known as: FLOMAX   Zinc Oxide 12.8 % ointment Commonly known as: TRIPLE PASTE       TAKE these medications    acetaminophen 650 MG CR tablet Commonly known as: TYLENOL Take 650 mg by mouth every 8 (eight) hours as needed for pain.   amoxicillin-clavulanate 875-125 MG tablet Commonly known as: AUGMENTIN Take 1 tablet by mouth 2 (two) times daily.   cyanocobalamin 1000 MCG  tablet Commonly known as: VITAMIN B12 Take 1,000 mcg by mouth daily. Notes to patient: Not given in the hospital   escitalopram 20 MG tablet Commonly known as: LEXAPRO Take 1 tablet (20 mg total) by mouth daily. Notes to patient: Not given in the hospital   glipiZIDE 5 MG tablet Commonly known as: GLUCOTROL Take 1 tablet (5 mg total) by mouth daily before breakfast.   levothyroxine 75 MCG tablet Commonly known as: SYNTHROID TAKE ONE TABLET BY MOUTH ONCE DAILY   polyethylene glycol 17 g packet Commonly known as: MIRALAX / GLYCOLAX Take 17 g by mouth daily as needed.   senna-docusate 8.6-50 MG tablet Commonly known as: Senokot-S Take 1 tablet by mouth 2 (two) times daily. Notes to patient: Not given in the hospital        Discharge Exam: Filed Weights   07/05/23 0202  Weight: 77.1 kg   EYES: Pupils equal, round, reactive to light and accommodation. No scleral icterus. Extraocular muscles intact.  HEENT: Head atraumatic, normocephalic. Oropharynx and nasopharynx clear.  NECK:  Supple, no jugular venous distention. No thyroid enlargement, no tenderness.  LUNGS: Normal breath sounds bilaterally, no wheezing, rales,rhonchi or crepitation. No use of accessory muscles of respiration.  CARDIOVASCULAR: Regular rate and rhythm, S1, S2 normal. No murmurs, rubs, or gallops.  ABDOMEN: Soft, nondistended, nontender. Bowel sounds present. No organomegaly or mass.  EXTREMITIES: No pedal edema, cyanosis, or clubbing.  NEUROLOGIC: Cranial nerves II through XII are intact. Muscle strength 5/5 in all extremities. Sensation intact. Gait not checked.  PSYCHIATRIC: The patient is alert  and oriented x 3.  Normal affect and good eye contact.  Condition at discharge: good  The results of significant diagnostics from this hospitalization (including imaging, microbiology, ancillary and laboratory) are listed below for reference.   Imaging Studies: DG Shoulder Right  Result Date:  07/05/2023 CLINICAL DATA:  Fall EXAM: RIGHT SHOULDER - 2+ VIEW COMPARISON:  None Available. FINDINGS: Chronic deformity of the humeral head with large medial osteophyte. Degenerative changes of the glenoid fossa. No acute fracture or dislocation. IMPRESSION: 1. No acute fracture or dislocation. 2. Chronic deformity of the humeral head with large medial osteophyte. Electronically Signed   By: Deatra Robinson M.D.   On: 07/05/2023 02:46   CT HEAD WO CONTRAST ( )  Result Date: 07/05/2023 CLINICAL DATA:  Trauma/fall, failure to thrive EXAM: CT HEAD WITHOUT CONTRAST CT CERVICAL SPINE WITHOUT CONTRAST TECHNIQUE: Multidetector CT imaging of the head and cervical spine was performed following the standard protocol without intravenous contrast. Multiplanar CT image reconstructions of the cervical spine were also generated. RADIATION DOSE REDUCTION: This exam was performed according to the departmental dose-optimization program which includes automated exposure control, adjustment of the mA and/or kV according to patient size and/or use of iterative reconstruction technique. COMPARISON:  CT head dated 02/07/2023 FINDINGS: CT HEAD FINDINGS Brain: No evidence of acute infarction, hemorrhage, hydrocephalus, extra-axial collection or mass lesion/mass effect. Global cortical and central atrophy.  Stable ventricular prominence. Subcortical white matter and periventricular small vessel ischemic changes. Vascular: Intracranial atherosclerosis. Skull: Normal. Negative for fracture or focal lesion. Sinuses/Orbits: The visualized paranasal sinuses are essentially clear. The mastoid air cells are unopacified. Other: None. CT CERVICAL SPINE FINDINGS Alignment: Straightening of the cervical spine, likely degenerative. Skull base and vertebrae: No acute fracture. No primary bone lesion or focal pathologic process. Soft tissues and spinal canal: No prevertebral fluid or swelling. No visible canal hematoma. Disc levels: Mild degenerative  changes, most prominent at C5-6. Spinal canal is patent. Upper chest: Visualized lung apices are clear. Other: Visualized thyroid is unremarkable. IMPRESSION: No acute intracranial abnormality. Atrophy with secondary ventriculomegaly. Small vessel ischemic changes. No traumatic injury to the cervical spine. Mild degenerative changes. Electronically Signed   By: Charline Bills M.D.   On: 07/05/2023 02:20   CT Cervical Spine Wo Contrast  Result Date: 07/05/2023 CLINICAL DATA:  Trauma/fall, failure to thrive EXAM: CT HEAD WITHOUT CONTRAST CT CERVICAL SPINE WITHOUT CONTRAST TECHNIQUE: Multidetector CT imaging of the head and cervical spine was performed following the standard protocol without intravenous contrast. Multiplanar CT image reconstructions of the cervical spine were also generated. RADIATION DOSE REDUCTION: This exam was performed according to the departmental dose-optimization program which includes automated exposure control, adjustment of the mA and/or kV according to patient size and/or use of iterative reconstruction technique. COMPARISON:  CT head dated 02/07/2023 FINDINGS: CT HEAD FINDINGS Brain: No evidence of acute infarction, hemorrhage, hydrocephalus, extra-axial collection or mass lesion/mass effect. Global cortical and central atrophy.  Stable ventricular prominence. Subcortical white matter and periventricular small vessel ischemic changes. Vascular: Intracranial atherosclerosis. Skull: Normal. Negative for fracture or focal lesion. Sinuses/Orbits: The visualized paranasal sinuses are essentially clear. The mastoid air cells are unopacified. Other: None. CT CERVICAL SPINE FINDINGS Alignment: Straightening of the cervical spine, likely degenerative. Skull base and vertebrae: No acute fracture. No primary bone lesion or focal pathologic process. Soft tissues and spinal canal: No prevertebral fluid or swelling. No visible canal hematoma. Disc levels: Mild degenerative changes, most prominent  at C5-6. Spinal canal is patent. Upper chest: Visualized  lung apices are clear. Other: Visualized thyroid is unremarkable. IMPRESSION: No acute intracranial abnormality. Atrophy with secondary ventriculomegaly. Small vessel ischemic changes. No traumatic injury to the cervical spine. Mild degenerative changes. Electronically Signed   By: Charline Bills M.D.   On: 07/05/2023 02:20    Microbiology: Results for orders placed or performed during the hospital encounter of 07/05/23  Urine Culture     Status: Abnormal   Collection Time: 07/05/23  2:58 AM   Specimen: Urine, Catheterized  Result Value Ref Range Status   Specimen Description URINE, CATHETERIZED  Final   Special Requests NONE  Final   Culture 30,000 COLONIES/mL YEAST (A)  Final   Report Status 07/06/2023 FINAL  Final    Labs: CBC: Recent Labs  Lab 07/05/23 0258 07/05/23 0514 07/06/23 0518  WBC 7.2 6.7 6.9  NEUTROABS 5.0  --  5.4  HGB 13.4 12.7* 14.0  HCT 40.2 39.2 41.7  MCV 92.4 94.2 92.7  PLT 289 277 266   Basic Metabolic Panel: Recent Labs  Lab 07/05/23 0258 07/05/23 0514 07/06/23 0518  NA 136 139 134*  K 4.0 3.8 3.7  CL 104 107 103  CO2 27 26 24   GLUCOSE 161* 128* 140*  BUN 34* 30* 21  CREATININE 1.13 0.88 0.96  CALCIUM 8.7* 8.7* 8.9   Liver Function Tests: Recent Labs  Lab 07/05/23 0258  AST 22  ALT 16  ALKPHOS 63  BILITOT 0.7  PROT 5.9*  ALBUMIN 3.3*   CBG: No results for input(s): "GLUCAP" in the last 168 hours.  Discharge time spent:  .  Signed: Loyce Dys, MD [facility] 07/06/2023

## 2023-07-06 NOTE — Plan of Care (Signed)

## 2023-07-06 NOTE — TOC Transition Note (Signed)
Transition of Care [facility]) - CM/SW Discharge Note   Patient Details  Name: Demarious Kapur. MRN: [id number] Date of Birth: Jun 20, 1941  [facility]) CM/SW Contact:  Marlowe Sax, RN Phone Number: 07/06/2023, 2:21 PM   Clinical Narrative:     Met with the wife and daughter, they have some questions about hospice, I reached out to [place] with [employer] and asked him to give her a call, she has a Hospital bed, wheelchair,. Lift chair and rolling walker at home and they will transport him home today        Patient Goals and CMS Choice      Discharge Placement                         Discharge Plan and Services Additional resources added to the After Visit Summary for                                       Social Determinants of Health (SDOH) Interventions SDOH Screenings   Food Insecurity: No Food Insecurity (07/05/2023)  Housing: Low Risk  (07/05/2023)  Transportation Needs: No Transportation Needs (07/05/2023)  Utilities: Not At Risk (07/05/2023)  Depression (PHQ2-9): High Risk (03/21/2023)  Tobacco Use: Medium Risk (07/05/2023)     Readmission Risk Interventions     No data to display

## 2023-07-06 NOTE — Plan of Care (Signed)
Patient discharged per MD orders at this time.All dc instructions, education and medications reviewed with the patient and spouse.Pt expressed understanding and will comply with dc instructions.follow up appointments was also communicated to the patient.no verbal c/o or any ssx of distress.Pt was discharged home with HH/PT/Hospice services per order.Pt was transported home by spouse in a privately owned vehicle.

## 2023-07-10 ENCOUNTER — Encounter: Payer: Self-pay | Admitting: Internal Medicine

## 2023-07-10 MED ORDER — TAMSULOSIN HCL 0.4 MG PO CAPS: 0.4000 mg | ORAL_CAPSULE | Freq: Every day | ORAL | 3 refills | Status: DC

## 2023-07-10 MED ORDER — SULFAMETHOXAZOLE-TRIMETHOPRIM 400-80 MG PO TABS: 1.0000 | ORAL_TABLET | Freq: Two times a day (BID) | ORAL | 1 refills | Status: DC

## 2023-07-12 ENCOUNTER — Encounter: Payer: Self-pay | Admitting: Internal Medicine

## 2023-07-12 ENCOUNTER — Ambulatory Visit: Payer: Medicare PPO | Admitting: Internal Medicine

## 2023-07-12 VITALS — BP 110/62 | HR 66 | Resp 12

## 2023-07-12 DIAGNOSIS — N401 Enlarged prostate with lower urinary tract symptoms: Secondary | ICD-10-CM | POA: Diagnosis not present

## 2023-07-12 DIAGNOSIS — E1149 Type 2 diabetes mellitus with other diabetic neurological complication: Secondary | ICD-10-CM

## 2023-07-12 DIAGNOSIS — R410 Disorientation, unspecified: Secondary | ICD-10-CM | POA: Diagnosis not present

## 2023-07-12 DIAGNOSIS — R5381 Other malaise: Secondary | ICD-10-CM

## 2023-07-12 DIAGNOSIS — Z7984 Long term (current) use of oral hypoglycemic drugs: Secondary | ICD-10-CM

## 2023-07-12 DIAGNOSIS — E119 Type 2 diabetes mellitus without complications: Secondary | ICD-10-CM | POA: Insufficient documentation

## 2023-07-12 NOTE — Assessment & Plan Note (Signed)
Now is bed bound Unclear if this is just a set back from the brief hospital stay--or permanent Family can get him into transport chair if enough here Continues on hospice

## 2023-07-12 NOTE — Assessment & Plan Note (Signed)
Had been off tamsulosin We restarted that Recurrent dysuria---so now on septra Discussed empiric Rx for 3-5 days with any symptoms (to prevent ER visits)

## 2023-07-12 NOTE — Assessment & Plan Note (Signed)
Doing okay on just the glipizide 5mg  daily

## 2023-07-12 NOTE — Progress Notes (Signed)
Subjective:    Patient ID: Randy Mcdaniel., male    DOB: 19-Feb-1941, 82 y.o.   MRN: [id number]  HPI Home visit for hospital follow up Wife here as usual Daughters are here  [place] 2 nights in a row Rescue came and helped him get up first in the kitchen Next day--was by lift chair which was up fully (?fell asleep on the remote) Rescue (related to hospice) decided to bring him then Presumed UTI so started with antibiotics Confusion--delirium Sent home next day--given hospice status  The culture from the hospital was negative Came home on antibiotic which was completed Still had some burning--so I sent septra SS for bid  Hasn't been able to walk since being in hospital Apparently hospice offered some therapy--he refused  Sleeps with condom catheter Trouble keeping them on  Sugars 128 - 160 when in hospital Wife hasn't checked sugars since (other than once non fasting--212)  Current Outpatient Medications on File Prior to Visit  Medication Sig Dispense Refill   acetaminophen (TYLENOL) 650 MG CR tablet Take 650 mg by mouth every 8 (eight) hours as needed for pain.     escitalopram (LEXAPRO) 20 MG tablet Take 1 tablet (20 mg total) by mouth daily. 1 tablet 0   glipiZIDE (GLUCOTROL) 5 MG tablet Take 1 tablet (5 mg total) by mouth daily before breakfast. 90 tablet 3   levothyroxine (SYNTHROID) 75 MCG tablet TAKE ONE TABLET BY MOUTH ONCE DAILY 90 tablet 3   senna-docusate (SENOKOT-S) 8.6-50 MG tablet Take 1 tablet by mouth 2 (two) times daily.     sulfamethoxazole-trimethoprim (BACTRIM) 400-80 MG tablet Take 1 tablet by mouth 2 (two) times daily. 10 tablet 1   tamsulosin (FLOMAX) 0.4 MG CAPS capsule Take 1-2 capsules (0.4-0.8 mg total) by mouth daily. 60 capsule 3   vitamin B-12 (CYANOCOBALAMIN) 1000 MCG tablet Take 1,000 mcg by mouth daily.     No current facility-administered medications on file prior to visit.    Allergies  Allergen Reactions   Vytorin  [Ezetimibe-Simvastatin] Nausea Only    patient nor wife does not remember    Past Medical History:  Diagnosis Date   Anemia    Anxiety    Dementia (HCC)    Depression    recurrent and severe   Diabetes mellitus    Type II with nephropathy   GI bleed    HLD (hyperlipidemia)    Hypertension    Hypothyroidism    Internal hemorrhoid    Osteoarthritis    Personal history of colonic adenoma 12/03/2012    Past Surgical History:  Procedure Laterality Date   COLONOSCOPY     COLONOSCOPY WITH PROPOFOL N/A 12/20/2016   Procedure: COLONOSCOPY WITH PROPOFOL;  Surgeon: Wyline Mood, MD;  Location: [facility] ENDOSCOPY;  Service: Endoscopy;  Laterality: N/A;   ESOPHAGOGASTRODUODENOSCOPY (EGD) WITH PROPOFOL N/A 12/17/2016   Procedure: ESOPHAGOGASTRODUODENOSCOPY (EGD) WITH PROPOFOL;  Surgeon: Charlott Rakes, MD;  Location: [facility] ENDOSCOPY;  Service: Endoscopy;  Laterality: N/A;   FLEXIBLE SIGMOIDOSCOPY N/A 12/17/2016   Procedure: FLEXIBLE SIGMOIDOSCOPY;  Surgeon: Charlott Rakes, MD;  Location: [facility] ENDOSCOPY;  Service: Endoscopy;  Laterality: N/A;   HEMORRHOID BANDING  04/2017   HEMORRHOID SURGERY N/A 06/14/2017   Procedure: HEMORRHOIDECTOMY;  Surgeon: Kieth Brightly, MD;  Location: [facility] ORS;  Service: General;  Laterality: N/A;   HERNIA REPAIR     JOINT REPLACEMENT Bilateral    PROCTOSCOPY N/A 06/14/2017   Procedure: PROCTOSCOPY;  Surgeon: Kieth Brightly, MD;  Location: [facility] ORS;  Service: General;  Laterality: N/A;   TOTAL KNEE ARTHROPLASTY  11/09   Right-Dr. Remer Macho ([facility])   TOTAL KNEE ARTHROPLASTY  6/10   Left-Dr. Remer Macho ([facility])   UMBILICAL HERNIA REPAIR  2000    Family History  Problem Relation Age of Onset   Heart failure Father    Diabetes Father    Depression Father    Coronary artery disease Father    Breast cancer Sister 91   Lung cancer Sister    Depression Sister    Stomach cancer Mother 18   Stroke Other        Paternal side   Parkinsonism Other        Cousin    Colon cancer Neg Hx    Rectal cancer Neg Hx    Esophageal cancer Neg Hx    Colon polyps Neg Hx     Social History   Socioeconomic History   Marital status: Married    Spouse name: [name]   Number of children: 2   Years of education: Not on file   Highest education level: Not on file  Occupational History   Occupation: Retired  Tobacco Use   Smoking status: Never    Passive exposure: Past   Smokeless tobacco: Former    Types: Chew    Quit date: 05/15/2002  Vaping Use   Vaping status: Never Used  Substance and Sexual Activity   Alcohol use: No    Alcohol/week: 0.0 standard drinks of alcohol   Drug use: No   Sexual activity: Not Currently  Other Topics Concern   Not on file  Social History Narrative   Has living will.   Wife is formal health care POA.  Daughter [name] is alternate   Would accept resuscitation attempts   No tube feeds if cognitively unaware   Social Determinants of Health   Financial Resource Strain: Not on file  Food Insecurity: No Food Insecurity (07/05/2023)   Hunger Vital Sign    Worried About Running Out of Food in the Last Year: Never true    Ran Out of Food in the Last Year: Never true  Transportation Needs: No Transportation Needs (07/05/2023)   PRAPARE - Administrator, Civil Service (Medical): No    Lack of Transportation (Non-Medical): No  Physical Activity: Not on file  Stress: Not on file  Social Connections: Not on file  Intimate Partner Violence: Not At Risk (07/05/2023)   Humiliation, Afraid, Rape, and Kick questionnaire    Fear of Current or Ex-Partner: No    Emotionally Abused: No    Physically Abused: No    Sexually Abused: No   Review of Systems Eating pretty good Bowels better with every other day senna-s Sleep is not great---wife did give him gabapentin once when he was having pain No skin breakdown    Objective:   Physical Exam Constitutional:      Appearance: Normal appearance.  Cardiovascular:     Rate  and Rhythm: Normal rate and regular rhythm.     Heart sounds: No murmur heard.    No gallop.  Pulmonary:     Effort: Pulmonary effort is normal.     Breath sounds: Normal breath sounds. No wheezing or rales.  Abdominal:     Palpations: Abdomen is soft.     Tenderness: There is no abdominal tenderness.  Musculoskeletal:     Cervical back: Neck supple.     Right lower leg: No edema.     Left lower leg:  No edema.  Lymphadenopathy:     Cervical: No cervical adenopathy.  Neurological:     Mental Status: He is alert.  Psychiatric:     Comments: Usual pessimistic self            Assessment & Plan:

## 2023-07-12 NOTE — Assessment & Plan Note (Signed)
In hospital  Seems to have cleared now

## 2023-07-24 ENCOUNTER — Encounter: Payer: Self-pay | Admitting: Oncology

## 2023-07-25 ENCOUNTER — Encounter: Payer: Self-pay | Admitting: Internal Medicine

## 2023-07-25 MED ORDER — SULFAMETHOXAZOLE-TRIMETHOPRIM 800-160 MG PO TABS: 1.0000 | ORAL_TABLET | Freq: Two times a day (BID) | ORAL | 0 refills | Status: DC

## 2023-08-07 ENCOUNTER — Encounter: Payer: Self-pay | Admitting: Oncology

## 2023-08-09 ENCOUNTER — Telehealth: Payer: Self-pay

## 2023-08-09 NOTE — Telephone Encounter (Signed)
Defer to pcp who returns in am

## 2023-08-09 NOTE — Telephone Encounter (Signed)
I spoke with Randy Mcdaniel with [facility]; Starting end of last week pt has left eye redness, itching and burning. No blurred vision, no vision changes, no fever and no watery eyes. No nasal congestion.Tiffany request med [facility] and cb to [facility] hospice today after reviewed by provider.Dr Alphonsus Sias out of office and sending note to Dr Milinda Antis who is in office ands [place] pool and will teams [place] CMA.

## 2023-08-10 MED ORDER — LEVOTHYROXINE SODIUM 75 MCG PO TABS: ORAL_TABLET | ORAL | 3 refills | Status: AC

## 2023-08-10 MED ORDER — POLYMYXIN B-TRIMETHOPRIM 10000-0.1 UNIT/ML-% OP SOLN: 1.0000 [drp] | Freq: Four times a day (QID) | OPHTHALMIC | 0 refills | Status: DC

## 2023-08-10 NOTE — Telephone Encounter (Signed)
Spoke to pt's wife. Also sent levothyroxine to [facility] at her request.

## 2023-08-10 NOTE — Telephone Encounter (Signed)
Please let them know I sent some eye drops to [facility] for him. Let hospice know also

## 2023-08-11 MED ORDER — NEOMYCIN-POLYMYXIN-DEXAMETH 3.5-10000-0.1 OP SUSP: 1.0000 [drp] | Freq: Four times a day (QID) | OPHTHALMIC | 1 refills | Status: AC

## 2023-08-11 NOTE — Addendum Note (Signed)
Addended by: Tillman Abide I on: 08/11/2023 03:22 PM   Modules accepted: Orders

## 2023-09-01 ENCOUNTER — Encounter: Payer: Self-pay | Admitting: Oncology

## 2023-09-06 ENCOUNTER — Ambulatory Visit: Payer: Medicare PPO | Admitting: Internal Medicine

## 2023-09-06 ENCOUNTER — Encounter: Payer: Self-pay | Admitting: Internal Medicine

## 2023-09-06 VITALS — BP 128/62 | HR 62 | Resp 16

## 2023-09-06 DIAGNOSIS — R5381 Other malaise: Secondary | ICD-10-CM

## 2023-09-06 DIAGNOSIS — F331 Major depressive disorder, recurrent, moderate: Secondary | ICD-10-CM | POA: Diagnosis not present

## 2023-09-06 DIAGNOSIS — G629 Polyneuropathy, unspecified: Secondary | ICD-10-CM

## 2023-09-06 DIAGNOSIS — G214 Vascular parkinsonism: Secondary | ICD-10-CM

## 2023-09-06 DIAGNOSIS — E441 Mild protein-calorie malnutrition: Secondary | ICD-10-CM

## 2023-09-06 NOTE — Assessment & Plan Note (Signed)
 Mostly in remission now that he is cared for in bed and is still on the escitalopram

## 2023-09-06 NOTE — Progress Notes (Signed)
 Subjective:    Patient ID: Randy Mcdaniel., male    DOB: 03/13/1941, 83 y.o.   MRN: [id number]  HPI Home visit for follow up of functional decline and hospice status Wife is here as usual Aide Jon is here  Has been working on home health care issues She has been having 8 hours of aides on weekdays Wife does okay at night--he is sleeping well and no worries about him getting up to fall  Seen by hospice nurse today Now with some deep tissue injury on heels---nothing open Wife has started putting a pillow under his calves  Daughter has tried to get him out of bed--but only got out for 4 hours at Christmas Totally bed bound now Doesn't seem like his depression is flaring Incontinent--but uses his condom catheter  (stays on with velcro closure)  Feet and legs are numb---that bothers him  Still having trouble with eyes The eye drops help---she uses it again if they start itching him or get red  Urinary symptoms did resolve Has the septra  left over in case of recurrence  Checking sugars occasionally  As low as 74---only over 200 if post prandial  Current Outpatient Medications on File Prior to Visit  Medication Sig Dispense Refill   acetaminophen  (TYLENOL ) 650 MG CR tablet Take 650 mg by mouth every 8 (eight) hours as needed for pain.     escitalopram  (LEXAPRO ) 20 MG tablet Take 1 tablet (20 mg total) by mouth daily. 1 tablet 0   glipiZIDE  (GLUCOTROL ) 5 MG tablet Take 1 tablet (5 mg total) by mouth daily before breakfast. 90 tablet 3   levothyroxine  (SYNTHROID ) 75 MCG tablet TAKE ONE TABLET BY MOUTH ONCE DAILY 90 tablet 3   neomycin -polymyxin b -dexamethasone (MAXITROL) 3.5-10000-0.1 SUSP Place 1 drop into both eyes every 6 (six) hours. 6 mL 1   senna-docusate (SENOKOT-S) 8.6-50 MG tablet Take 1 tablet by mouth 2 (two) times daily.     tamsulosin  (FLOMAX ) 0.4 MG CAPS capsule Take 1-2 capsules (0.4-0.8 mg total) by mouth daily. 60 capsule 3   vitamin B-12 (CYANOCOBALAMIN )  1000 MCG tablet Take 1,000 mcg by mouth daily.     No current facility-administered medications on file prior to visit.    Allergies  Allergen Reactions   Vytorin [Ezetimibe-Simvastatin] Nausea Only    patient nor wife does not remember    Past Medical History:  Diagnosis Date   Anemia    Anxiety    Dementia (HCC)    Depression    recurrent and severe   Diabetes mellitus    Type II with nephropathy   GI bleed    HLD (hyperlipidemia)    Hypertension    Hypothyroidism    Internal hemorrhoid    Osteoarthritis    Personal history of colonic adenoma 12/03/2012    Past Surgical History:  Procedure Laterality Date   COLONOSCOPY     COLONOSCOPY WITH PROPOFOL  N/A 12/20/2016   Procedure: COLONOSCOPY WITH PROPOFOL ;  Surgeon: Ruel Kung, MD;  Location: [facility] ENDOSCOPY;  Service: Endoscopy;  Laterality: N/A;   ESOPHAGOGASTRODUODENOSCOPY (EGD) WITH PROPOFOL  N/A 12/17/2016   Procedure: ESOPHAGOGASTRODUODENOSCOPY (EGD) WITH PROPOFOL ;  Surgeon: Jerrell Sol, MD;  Location: [facility] ENDOSCOPY;  Service: Endoscopy;  Laterality: N/A;   FLEXIBLE SIGMOIDOSCOPY N/A 12/17/2016   Procedure: FLEXIBLE SIGMOIDOSCOPY;  Surgeon: Jerrell Sol, MD;  Location: [facility] ENDOSCOPY;  Service: Endoscopy;  Laterality: N/A;   HEMORRHOID BANDING  04/2017   HEMORRHOID SURGERY N/A 06/14/2017   Procedure: HEMORRHOIDECTOMY;  Surgeon: Dellie, Seeplaputhur  G, MD;  Location: [facility] ORS;  Service: General;  Laterality: N/A;   HERNIA REPAIR     JOINT REPLACEMENT Bilateral    PROCTOSCOPY N/A 06/14/2017   Procedure: PROCTOSCOPY;  Surgeon: Dellie Louanne MATSU, MD;  Location: [facility] ORS;  Service: General;  Laterality: N/A;   TOTAL KNEE ARTHROPLASTY  11/09   Right-Dr. Roy ([facility])   TOTAL KNEE ARTHROPLASTY  6/10   Left-Dr. Roy ([facility])   UMBILICAL HERNIA REPAIR  2000    Family History  Problem Relation Age of Onset   Heart failure Father    Diabetes Father    Depression Father    Coronary artery disease Father     Breast cancer Sister 17   Lung cancer Sister    Depression Sister    Stomach cancer Mother 72   Stroke Other        Paternal side   Parkinsonism Other        Cousin   Colon cancer Neg Hx    Rectal cancer Neg Hx    Esophageal cancer Neg Hx    Colon polyps Neg Hx     Social History   Socioeconomic History   Marital status: Married    Spouse name: [name]   Number of children: 2   Years of education: Not on file   Highest education level: Not on file  Occupational History   Occupation: Retired  Tobacco Use   Smoking status: Never    Passive exposure: Past   Smokeless tobacco: Former    Types: Chew    Quit date: 05/15/2002  Vaping Use   Vaping status: Never Used  Substance and Sexual Activity   Alcohol use: No    Alcohol/week: 0.0 standard drinks of alcohol   Drug use: No   Sexual activity: Not Currently  Other Topics Concern   Not on file  Social History Narrative   Has living will.   Wife is formal health care POA.  Daughter [name] is alternate   Would accept resuscitation attempts   No tube feeds if cognitively unaware   Social Drivers of Health   Financial Resource Strain: Not on file  Food Insecurity: No Food Insecurity (07/05/2023)   Hunger Vital Sign    Worried About Running Out of Food in the Last Year: Never true    Ran Out of Food in the Last Year: Never true  Transportation Needs: No Transportation Needs (07/05/2023)   PRAPARE - Administrator, Civil Service (Medical): No    Lack of Transportation (Non-Medical): No  Physical Activity: Not on file  Stress: Not on file  Social Connections: Not on file  Intimate Partner Violence: Not At Risk (07/05/2023)   Humiliation, Afraid, Rape, and Kick questionnaire    Fear of Current or Ex-Partner: No    Emotionally Abused: No    Physically Abused: No    Sexually Abused: No   Review of Systems Sleeps okay Eating well Some cough--no SOB Bowels move slowly---wife gives him senna    Objective:    Physical Exam Constitutional:      Comments: Mild wasting  Cardiovascular:     Rate and Rhythm: Normal rate and regular rhythm.     Heart sounds: No murmur heard.    No gallop.     Comments: Faint pedal pulses Pulmonary:     Effort: Pulmonary effort is normal.     Breath sounds: Normal breath sounds. No wheezing or rales.  Abdominal:     Palpations: Abdomen is  soft.     Tenderness: There is no abdominal tenderness.  Musculoskeletal:     Cervical back: Neck supple.  Lymphadenopathy:     Cervical: No cervical adenopathy.  Skin:    Comments: Heels look better now--pressure off since AM so no dark areas or ulcers now  Neurological:     Mental Status: He is alert.  Psychiatric:        Mood and Affect: Mood normal.        Behavior: Behavior normal.            Assessment & Plan:

## 2023-09-06 NOTE — Assessment & Plan Note (Signed)
 Now bed bound Some bradykinesia

## 2023-09-06 NOTE — Assessment & Plan Note (Signed)
 Discussed trying lidocaine topically Could also try alpha lipoic acid Pain is helped by the gabapentin

## 2023-09-06 NOTE — Assessment & Plan Note (Signed)
 Eating okay so weight seems to have stabilized Still with some wasting that persists

## 2023-09-06 NOTE — Assessment & Plan Note (Addendum)
 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

## 2023-09-20 ENCOUNTER — Encounter: Payer: Self-pay | Admitting: Internal Medicine

## 2023-09-29 ENCOUNTER — Encounter: Payer: Self-pay | Admitting: Internal Medicine

## 2023-10-04 ENCOUNTER — Telehealth: Payer: Self-pay

## 2023-10-04 NOTE — Telephone Encounter (Signed)
 Both eyes have been red, dry, itchy. Steroid drops did not help. What else can they try?

## 2023-10-04 NOTE — Telephone Encounter (Signed)
Spoke to [employer]. She said [name] has used Visine Dry Eye Drops. I tried to call [name] to discuss using something like Systane or Genteal. I will send a [employer].

## 2023-10-10 ENCOUNTER — Telehealth: Payer: Self-pay | Admitting: Internal Medicine

## 2023-10-10 NOTE — Telephone Encounter (Signed)
Reviewed patient chart do not see last UTI. Not sure what needs to be called in.

## 2023-10-10 NOTE — Telephone Encounter (Signed)
Copied from CRM [id number]. Topic: General - Other >> Oct 10, 2023 12:26 PM Fredrich Romans wrote: Reason for CRM: Brandy from [facility] hospice called In stating that the patient is bed bound,they tested his urine due to him not feeling well. He tested positive a UTI,she would like to know if Dr Alphonsus Sias would write an order for an antibiotic? Contact#:[phone]

## 2023-10-11 ENCOUNTER — Ambulatory Visit: Payer: Self-pay | Admitting: Internal Medicine

## 2023-10-11 ENCOUNTER — Encounter: Payer: Self-pay | Admitting: Internal Medicine

## 2023-10-11 ENCOUNTER — Encounter: Payer: Self-pay | Admitting: Oncology

## 2023-10-11 MED ORDER — AMOXICILLIN-POT CLAVULANATE 875-125 MG PO TABS: 1.0000 | ORAL_TABLET | Freq: Two times a day (BID) | ORAL | 0 refills | Status: DC

## 2023-10-11 NOTE — Addendum Note (Signed)
Addended by: Tillman Abide I on: 10/11/2023 07:27 AM   Modules accepted: Orders

## 2023-10-11 NOTE — Telephone Encounter (Signed)
Left message to return call to our office.  Will send message in my chart as well.

## 2023-10-11 NOTE — Telephone Encounter (Signed)
Information obtained from [place], RN with [facility], [phone]. Please call Tiffany with update.   Chief Complaint: possible allergic rxn to abx, hives Symptoms: hives, itching,  Frequency: noticed this morning became more prevalent as day has progressed Pertinent Negatives: Patient denies fever, airway compromise, swelling of lips and tongue, scratchy throat,   Disposition: [] ED /[] Urgent Care (no appt availability in office) / [] Appointment(In office/virtual)/ []  [facility]/ [] Home Care/ [] Refused Recommended Disposition /[] [facility]/ [x]  Follow-up with PCP  Additional Notes: Pt recently started an abx for UTI, staff found pt this morning with raised, itchy red rash to back and thighs. Pt denies airway issues. Per [facility] nurse pt was advised to stop taking the abx. Nurse would like new rx.    Copied from CRM [id number]. Topic: Clinical - Red Word Triage >> Oct 11, 2023  2:59 PM Irine Seal wrote: [facility] that prompted transfer to Nurse Triage: tiffany RN with [facility] calling to state that patient is having a potential allergic reaction to antibiotic rash patches and splotchy with itching Reason for Disposition . Hives or itching  Answer Assessment - Initial Assessment Questions 1. APPEARANCE of RASH: "Describe the rash." (e.g., spots, blisters, raised areas, skin peeling, scaly)     Hives, red splotchy areas, raised 2. SIZE: "How big are the spots?" (e.g., tip of pen, eraser, coin; inches, centimeters)     They are all different sizes 3. LOCATION: "Where is the rash located?"     Thighs and back 4. COLOR: "What color is the rash?" (Note: It is difficult to assess rash color in people with darker-colored skin. When this situation occurs, simply ask the caller to describe what they see.)     red 5. ONSET: "When did the rash begin?"     Noticed them earlier this morning, more prevalent now 6. FEVER: "Do you have a fever?" If Yes, ask: "What is your temperature,  how was it measured, and when did it start?"     denies 7. ITCHING: "Does the rash itch?" If Yes, ask: "How bad is the itch?" (Scale 1-10; or mild, moderate, severe)     mild 8. CAUSE: "What do you think is causing the rash?"     Abx? 9. NEW MEDICINES: "What new medicines are you taking?" (e.g., name of antibiotic) "When did you start taking this medication?".     New abx 10. OTHER SYMPTOMS: "Do you have any other symptoms?" (e.g., sore throat, fever, joint pain)       denies  Protocols used: Rash - Widespread On Drugs-A-AH

## 2023-10-12 ENCOUNTER — Encounter: Payer: Self-pay | Admitting: Oncology

## 2023-10-12 ENCOUNTER — Telehealth: Payer: Self-pay

## 2023-10-12 NOTE — Telephone Encounter (Signed)
Left message on VM for Brandy with Dr Karle Starch message.

## 2023-10-12 NOTE — Telephone Encounter (Signed)
Copied from CRM [id number]. Topic: Clinical - Medical Advice >> Oct 12, 2023 10:19 AM Lorin Glass B wrote: Reason for CRM: Nurse Brandy @ [facility] called in stating that patient was just treated for antibiotics due to UTI, and that patient has developed a rash along thigh area that seems to be causing itching. She stated that she just wanted to make sure Dr Alphonsus Sias was aware, and to confirm whether or not he wanted patient to continue on the antibiotics in case allergic reaction is suspected. Callback [phone]

## 2023-10-12 NOTE — Telephone Encounter (Signed)
Tried to call Tiffany RN back and her VM was not set up or the wrong # was provided for her.

## 2023-11-01 ENCOUNTER — Encounter: Payer: Self-pay | Admitting: Internal Medicine

## 2023-11-01 ENCOUNTER — Ambulatory Visit: Admitting: Internal Medicine

## 2023-11-01 VITALS — BP 132/62 | HR 60 | Resp 14

## 2023-11-01 DIAGNOSIS — N400 Enlarged prostate without lower urinary tract symptoms: Secondary | ICD-10-CM

## 2023-11-01 DIAGNOSIS — G8220 Paraplegia, unspecified: Secondary | ICD-10-CM

## 2023-11-01 DIAGNOSIS — F331 Major depressive disorder, recurrent, moderate: Secondary | ICD-10-CM | POA: Diagnosis not present

## 2023-11-01 DIAGNOSIS — R5381 Other malaise: Secondary | ICD-10-CM

## 2023-11-01 DIAGNOSIS — Z7984 Long term (current) use of oral hypoglycemic drugs: Secondary | ICD-10-CM

## 2023-11-01 DIAGNOSIS — E1149 Type 2 diabetes mellitus with other diabetic neurological complication: Secondary | ICD-10-CM | POA: Diagnosis not present

## 2023-11-01 MED ORDER — SENNOSIDES-DOCUSATE SODIUM 8.6-50 MG PO TABS: 1.0000 | ORAL_TABLET | Freq: Every day | ORAL | 0 refills | Status: DC

## 2023-11-01 NOTE — Progress Notes (Signed)
 Subjective:    Patient ID: Randy Mcdaniel., male    DOB: April 30, 1941, 83 y.o.   MRN: [id number]  HPI Home visit for follow up of medical condition--bed bound and on hospice Wife here as usual  Hospice aide comes three days a week for bath, etc Nurse weekly--today Feet looking better---new product to protect skin Now has Foley catheter--too much trouble with the condom catheter Wife also contracts separately for aides 3 days a week to give support  He feels okay Has not left bed since my last visit No current skin issues Toes are now curled under   Eating okay---occasionally loses appetite Weight seems to be holding now  No chest pain or SOB No dizziness or syncope No edema No palpitations  Wife checks sugars rarely Never over 160  Current Outpatient Medications on File Prior to Visit  Medication Sig Dispense Refill   acetaminophen (TYLENOL) 650 MG CR tablet Take 650 mg by mouth every 8 (eight) hours as needed for pain.     escitalopram (LEXAPRO) 20 MG tablet Take 1 tablet (20 mg total) by mouth daily. 1 tablet 0   gabapentin (NEURONTIN) 300 MG capsule Take 1,200 mg by mouth at bedtime.     glipiZIDE (GLUCOTROL) 5 MG tablet Take 1 tablet (5 mg total) by mouth daily before breakfast. 90 tablet 3   levothyroxine (SYNTHROID) 75 MCG tablet TAKE ONE TABLET BY MOUTH ONCE DAILY 90 tablet 3   neomycin-polymyxin b-dexamethasone (MAXITROL) 3.5-10000-0.1 SUSP Place 1 drop into both eyes every 6 (six) hours. 6 mL 1   tamsulosin (FLOMAX) 0.4 MG CAPS capsule Take 1-2 capsules (0.4-0.8 mg total) by mouth daily. 60 capsule 3   vitamin B-12 (CYANOCOBALAMIN) 1000 MCG tablet Take 1,000 mcg by mouth daily.     No current facility-administered medications on file prior to visit.    Allergies  Allergen Reactions   Vytorin [Ezetimibe-Simvastatin] Nausea Only    patient nor wife does not remember    Past Medical History:  Diagnosis Date   Anemia    Anxiety    Dementia (HCC)     Depression    recurrent and severe   Diabetes mellitus    Type II with nephropathy   GI bleed    HLD (hyperlipidemia)    Hypertension    Hypothyroidism    Internal hemorrhoid    Osteoarthritis    Personal history of colonic adenoma 12/03/2012    Past Surgical History:  Procedure Laterality Date   COLONOSCOPY     COLONOSCOPY WITH PROPOFOL N/A 12/20/2016   Procedure: COLONOSCOPY WITH PROPOFOL;  Surgeon: Wyline Mood, MD;  Location: [facility] ENDOSCOPY;  Service: Endoscopy;  Laterality: N/A;   ESOPHAGOGASTRODUODENOSCOPY (EGD) WITH PROPOFOL N/A 12/17/2016   Procedure: ESOPHAGOGASTRODUODENOSCOPY (EGD) WITH PROPOFOL;  Surgeon: Charlott Rakes, MD;  Location: [facility] ENDOSCOPY;  Service: Endoscopy;  Laterality: N/A;   FLEXIBLE SIGMOIDOSCOPY N/A 12/17/2016   Procedure: FLEXIBLE SIGMOIDOSCOPY;  Surgeon: Charlott Rakes, MD;  Location: [facility] ENDOSCOPY;  Service: Endoscopy;  Laterality: N/A;   HEMORRHOID BANDING  04/2017   HEMORRHOID SURGERY N/A 06/14/2017   Procedure: HEMORRHOIDECTOMY;  Surgeon: Kieth Brightly, MD;  Location: [facility] ORS;  Service: General;  Laterality: N/A;   HERNIA REPAIR     JOINT REPLACEMENT Bilateral    PROCTOSCOPY N/A 06/14/2017   Procedure: PROCTOSCOPY;  Surgeon: Kieth Brightly, MD;  Location: [facility] ORS;  Service: General;  Laterality: N/A;   TOTAL KNEE ARTHROPLASTY  11/09   Right-Dr. Remer Macho ([facility])   TOTAL KNEE  ARTHROPLASTY  6/10   Left-Dr. Remer Macho ([facility])   UMBILICAL HERNIA REPAIR  2000    Family History  Problem Relation Age of Onset   Heart failure Father    Diabetes Father    Depression Father    Coronary artery disease Father    Breast cancer Sister 92   Lung cancer Sister    Depression Sister    Stomach cancer Mother 49   Stroke Other        Paternal side   Parkinsonism Other        Cousin   Colon cancer Neg Hx    Rectal cancer Neg Hx    Esophageal cancer Neg Hx    Colon polyps Neg Hx     Social History   Socioeconomic History   Marital  status: Married    Spouse name: [name]   Number of children: 2   Years of education: Not on file   Highest education level: Not on file  Occupational History   Occupation: Retired  Tobacco Use   Smoking status: Never    Passive exposure: Past   Smokeless tobacco: Former    Types: Chew    Quit date: 05/15/2002  Vaping Use   Vaping status: Never Used  Substance and Sexual Activity   Alcohol use: No    Alcohol/week: 0.0 standard drinks of alcohol   Drug use: No   Sexual activity: Not Currently  Other Topics Concern   Not on file  Social History Narrative   Has living will.   Wife is formal health care POA.  Daughter [name] is alternate   Would accept resuscitation attempts   No tube feeds if cognitively unaware   Social Drivers of Health   Financial Resource Strain: Not on file  Food Insecurity: No Food Insecurity (07/05/2023)   Hunger Vital Sign    Worried About Running Out of Food in the Last Year: Never true    Ran Out of Food in the Last Year: Never true  Transportation Needs: No Transportation Needs (07/05/2023)   PRAPARE - Administrator, Civil Service (Medical): No    Lack of Transportation (Non-Medical): No  Physical Activity: Not on file  Stress: Not on file  Social Connections: Not on file  Intimate Partner Violence: Not At Risk (07/05/2023)   Humiliation, Afraid, Rape, and Kick questionnaire    Fear of Current or Ex-Partner: No    Emotionally Abused: No    Physically Abused: No    Sexually Abused: No   Review of Systems Bowels moving okay---now taking the senna daily Sleeps okay    Objective:   Physical Exam Constitutional:      Appearance: Normal appearance.  Cardiovascular:     Rate and Rhythm: Normal rate and regular rhythm.     Heart sounds: No murmur heard.    No gallop.  Pulmonary:     Effort: Pulmonary effort is normal.     Breath sounds: Normal breath sounds. No wheezing or rales.  Abdominal:     Palpations: Abdomen is soft.      Tenderness: There is no abdominal tenderness.  Musculoskeletal:     Cervical back: Neck supple.     Right lower leg: No edema.     Left lower leg: No edema.  Lymphadenopathy:     Cervical: No cervical adenopathy.  Neurological:     Mental Status: He is alert.     Comments: Leg strength now decreased markedly--can lift legs a little but left  foot drop and limited mobility in right foot  Psychiatric:        Mood and Affect: Mood normal.        Behavior: Behavior normal.            Assessment & Plan:

## 2023-11-01 NOTE — Assessment & Plan Note (Signed)
 Now with Foley Will stop the tamsulosin

## 2023-11-01 NOTE — Assessment & Plan Note (Signed)
 Actually settling in to new life in bed (he seems to have preferred this to some degree) Continue the lexapro 20mg  daily

## 2023-11-01 NOTE — Assessment & Plan Note (Signed)
 Seems to be adequately controlled with the 5mg  glipizide

## 2023-11-01 NOTE — Assessment & Plan Note (Signed)
 Progression of weakness in legs---likely related to the spinal stenosis Discussed that his dropped feet are not set in place---so safe to transfer him to wheelchair if able bodied folks to lift him (he can put feet flat down on the floor)

## 2023-11-01 NOTE — Assessment & Plan Note (Signed)
 Has declined Bed bound still---now with Foley Total care except eating

## 2023-11-02 NOTE — Addendum Note (Signed)
 Addended by: Tillman Abide I on: 11/02/2023 08:42 AM   Modules accepted: Orders

## 2023-11-29 ENCOUNTER — Encounter: Payer: Self-pay | Admitting: Oncology

## 2023-12-06 DIAGNOSIS — E1149 Type 2 diabetes mellitus with other diabetic neurological complication: Secondary | ICD-10-CM | POA: Diagnosis not present

## 2024-01-03 ENCOUNTER — Ambulatory Visit (INDEPENDENT_AMBULATORY_CARE_PROVIDER_SITE_OTHER): Admitting: Internal Medicine

## 2024-01-03 ENCOUNTER — Encounter: Payer: Self-pay | Admitting: Internal Medicine

## 2024-01-03 VITALS — BP 132/64 | HR 60 | Resp 14

## 2024-01-03 DIAGNOSIS — R5381 Other malaise: Secondary | ICD-10-CM | POA: Diagnosis not present

## 2024-01-03 DIAGNOSIS — I739 Peripheral vascular disease, unspecified: Secondary | ICD-10-CM

## 2024-01-03 DIAGNOSIS — E119 Type 2 diabetes mellitus without complications: Secondary | ICD-10-CM

## 2024-01-03 DIAGNOSIS — Z7984 Long term (current) use of oral hypoglycemic drugs: Secondary | ICD-10-CM

## 2024-01-03 DIAGNOSIS — G8220 Paraplegia, unspecified: Secondary | ICD-10-CM

## 2024-01-03 DIAGNOSIS — E1142 Type 2 diabetes mellitus with diabetic polyneuropathy: Secondary | ICD-10-CM

## 2024-01-03 DIAGNOSIS — G214 Vascular parkinsonism: Secondary | ICD-10-CM

## 2024-01-03 NOTE — Assessment & Plan Note (Signed)
 No functional leg movement

## 2024-01-03 NOTE — Assessment & Plan Note (Signed)
 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

## 2024-01-03 NOTE — Assessment & Plan Note (Signed)
 Bed bound and close to total care (except eating) On hospice still

## 2024-01-03 NOTE — Progress Notes (Signed)
 Subjective:    Patient ID: Randy Mcdaniel., male    DOB: 26-Feb-1941, 83 y.o.   MRN: [id number]  HPI Home visit for follow up of chronic medical issues in hospice patient Wife here as usual--daughter also  Doing about the same [facility] nurse--Tiffany-- and reviewed care  Wife still has aides 3 days a week from hospice for baths Private aides 3 days a week--finally getting reimbursed from his long term care policy Does eat himself--once wife brings in food Help with other ADLs  Still bed bound--did get out of bed for his birthday Strong hands physically picked him up--then he could visit in living room No bed sores--bottom is fine Small red area on lateral right foot and ankle--getting cream for this  No problems with Foley--just changed today by RN  No SOB No chest pain No dizziness or syncope No edema No palpitations  No sig joint pains  Has CGM Time in target 62% No low sugars  Current Outpatient Medications on File Prior to Visit  Medication Sig Dispense Refill   acetaminophen  (TYLENOL ) 650 MG CR tablet Take 650 mg by mouth every 8 (eight) hours as needed for pain.     escitalopram  (LEXAPRO ) 20 MG tablet Take 1 tablet (20 mg total) by mouth daily. 1 tablet 0   gabapentin  (NEURONTIN ) 300 MG capsule Take 1,200 mg by mouth at bedtime.     glipiZIDE  (GLUCOTROL ) 5 MG tablet Take 1 tablet (5 mg total) by mouth daily before breakfast. 90 tablet 3   levothyroxine  (SYNTHROID ) 75 MCG tablet TAKE ONE TABLET BY MOUTH ONCE DAILY 90 tablet 3   neomycin -polymyxin b -dexamethasone (MAXITROL) 3.5-10000-0.1 SUSP Place 1 drop into both eyes every 6 (six) hours. 6 mL 1   senna-docusate (SENOKOT-S) 8.6-50 MG tablet Take 1 tablet by mouth daily. 1 tablet 0   vitamin B-12 (CYANOCOBALAMIN ) 1000 MCG tablet Take 1,000 mcg by mouth daily.     No current facility-administered medications on file prior to visit.    Allergies  Allergen Reactions   Vytorin [Ezetimibe-Simvastatin] Nausea  Only    patient nor wife does not remember    Past Medical History:  Diagnosis Date   Anemia    Anxiety    Dementia (HCC)    Depression    recurrent and severe   Diabetes mellitus    Type II with nephropathy   GI bleed    HLD (hyperlipidemia)    Hypertension    Hypothyroidism    Internal hemorrhoid    Osteoarthritis    Personal history of colonic adenoma 12/03/2012    Past Surgical History:  Procedure Laterality Date   COLONOSCOPY     COLONOSCOPY WITH PROPOFOL  N/A 12/20/2016   Procedure: COLONOSCOPY WITH PROPOFOL ;  Surgeon: Luke Salaam, MD;  Location: [facility] ENDOSCOPY;  Service: Endoscopy;  Laterality: N/A;   ESOPHAGOGASTRODUODENOSCOPY (EGD) WITH PROPOFOL  N/A 12/17/2016   Procedure: ESOPHAGOGASTRODUODENOSCOPY (EGD) WITH PROPOFOL ;  Surgeon: Baldo Bonds, MD;  Location: [facility] ENDOSCOPY;  Service: Endoscopy;  Laterality: N/A;   FLEXIBLE SIGMOIDOSCOPY N/A 12/17/2016   Procedure: FLEXIBLE SIGMOIDOSCOPY;  Surgeon: Baldo Bonds, MD;  Location: [facility] ENDOSCOPY;  Service: Endoscopy;  Laterality: N/A;   HEMORRHOID BANDING  04/2017   HEMORRHOID SURGERY N/A 06/14/2017   Procedure: HEMORRHOIDECTOMY;  Surgeon: Jerlean Mood, MD;  Location: [facility] ORS;  Service: General;  Laterality: N/A;   HERNIA REPAIR     JOINT REPLACEMENT Bilateral    PROCTOSCOPY N/A 06/14/2017   Procedure: PROCTOSCOPY;  Surgeon: Jerlean Mood, MD;  Location: [facility] ORS;  Service: General;  Laterality: N/A;   TOTAL KNEE ARTHROPLASTY  11/09   Right-Dr. Walter Gunning ([facility])   TOTAL KNEE ARTHROPLASTY  6/10   Left-Dr. Walter Gunning ([facility])   UMBILICAL HERNIA REPAIR  2000    Family History  Problem Relation Age of Onset   Heart failure Father    Diabetes Father    Depression Father    Coronary artery disease Father    Breast cancer Sister 50   Lung cancer Sister    Depression Sister    Stomach cancer Mother 38   Stroke Other        Paternal side   Parkinsonism Other        Cousin   Colon cancer Neg Hx     Rectal cancer Neg Hx    Esophageal cancer Neg Hx    Colon polyps Neg Hx     Social History   Socioeconomic History   Marital status: Married    Spouse name: Randy Mcdaniel   Number of children: 2   Years of education: Not on file   Highest education level: Not on file  Occupational History   Occupation: Retired  Tobacco Use   Smoking status: Never    Passive exposure: Past   Smokeless tobacco: Former    Types: Chew    Quit date: 05/15/2002  Vaping Use   Vaping status: Never Used  Substance and Sexual Activity   Alcohol use: No    Alcohol/week: 0.0 standard drinks of alcohol   Drug use: No   Sexual activity: Not Currently  Other Topics Concern   Not on file  Social History Narrative   Has living will.   Wife is formal health care POA.  Daughter Randy Mcdaniel is alternate   Would accept resuscitation attempts   No tube feeds if cognitively unaware   Social Drivers of Health   Financial Resource Strain: Not on file  Food Insecurity: No Food Insecurity (07/05/2023)   Hunger Vital Sign    Worried About Running Out of Food in the Last Year: Never true    Ran Out of Food in the Last Year: Never true  Transportation Needs: No Transportation Needs (07/05/2023)   PRAPARE - Administrator, Civil Service (Medical): No    Lack of Transportation (Non-Medical): No  Physical Activity: Not on file  Stress: Not on file  Social Connections: Not on file  Intimate Partner Violence: Not At Risk (07/05/2023)   Humiliation, Afraid, Rape, and Kick questionnaire    Fear of Current or Ex-Partner: No    Emotionally Abused: No    Physically Abused: No    Sexually Abused: No   Review of Systems Eating okay Weight seems stable---but having increasing atrophy in legs Sleeps on and off at night--some trouble initiating. Does nap in day though Bowels move okay     Objective:   Physical Exam Constitutional:      Comments: Some wasting  Cardiovascular:     Rate and Rhythm: Normal rate and  regular rhythm.     Heart sounds: No murmur heard.    No gallop.     Comments: Feet warm without pulses Pulmonary:     Effort: Pulmonary effort is normal.     Breath sounds: No wheezing or rales.     Comments: Mild decreased breath sounds Musculoskeletal:     Cervical back: Neck supple.     Right lower leg: No edema.     Left lower leg: No edema.  Comments: Leg atrophy  Lymphadenopathy:     Cervical: No cervical adenopathy.  Skin:    Comments: Slight redness along lateral right foot--no ulceration  Neurological:     Mental Status: He is alert.  Psychiatric:        Mood and Affect: Mood normal.            Assessment & Plan:

## 2024-01-03 NOTE — Assessment & Plan Note (Signed)
 No pain but is bed bound--no action

## 2024-01-03 NOTE — Assessment & Plan Note (Signed)
 Sugars okay just on glipizide  5mg 

## 2024-01-10 ENCOUNTER — Ambulatory Visit: Payer: Self-pay

## 2024-01-10 NOTE — Telephone Encounter (Signed)
  Chief Complaint: Burning with urination Symptoms: burning with urination and penile discharge Frequency: 2 days and occurs occasionally Pertinent Negatives: Patient denies fever Disposition: [] ED /[] Urgent Care (no appt availability in office) / [] Appointment(In office/virtual)/ []  [place] [facility]/ [] Home Care/ [] Refused Recommended Disposition /[] [facility]/ [x]  Follow-up with PCP Additional Notes: [name], hospice nurse calling to report patient is complaining of occasional burning with urination and penile discharge. Patient does have a long term urinary catheter placed and it was changed out last week. RN states symptoms started about two days ago. Pain is 2-3 out of 10. RN is asking if an antibiotic should be started. [name] is asking to be updated at [phone] and that if any medication needs to be sent to [facility] which is on file.    Copied from CRM [id number]. Topic: Clinical - Red Word Triage >> Jan 10, 2024 12:53 PM [name] wrote: Red Word that prompted transfer to Nurse Triage: Burning with Urination/Discharge, [name], RN on call on behalf of patient and wanted to know if antibiotic should be started. Reason for Disposition  Pus (white, yellow) or bloody discharge from end of penis  Answer Assessment - Initial Assessment Questions 1. SEVERITY: "How bad is the pain?"  (e.g., Scale 1-10; mild, moderate, or severe)   - MILD (1-3): Complains slightly about urination hurting.   - MODERATE (4-7): Interferes with normal activities.     - SEVERE (8-10): Excruciating, unwilling or unable to urinate because of the pain.      2-3 2. FREQUENCY: "How many times have you had painful urination today?"      Occasionally 3. PATTERN: "Is pain present every time you urinate or just sometimes?"      intermittent 4. ONSET: "When did the painful urination start?"      2 days ago 5. FEVER: "Do you have a fever?" If Yes, ask: "What is your temperature, how  was it measured, and when did it start?"     no 6. PAST UTI: "Have you had a urine infection before?" If Yes, ask: "When was the last time?" and "What happened that time?"      Yes but not recently 7. CAUSE: "What do you think is causing the painful urination?"      Possible UTI 8. OTHER SYMPTOMS: "Do you have any other symptoms?" (e.g., flank pain, penis discharge, scrotal pain, blood in urine)     Penis discharge  Protocols used: Urination Pain - Male-A-AH

## 2024-01-11 ENCOUNTER — Other Ambulatory Visit: Payer: Self-pay | Admitting: Internal Medicine

## 2024-01-11 ENCOUNTER — Encounter: Payer: Self-pay | Admitting: Internal Medicine

## 2024-01-11 MED ORDER — SULFAMETHOXAZOLE-TRIMETHOPRIM 800-160 MG PO TABS: 1.0000 | ORAL_TABLET | Freq: Two times a day (BID) | ORAL | 1 refills | Status: DC

## 2024-01-11 NOTE — Telephone Encounter (Signed)
 Pt's wife sent a MyChart message this morning about this issue. I let her know the antibiotic was sent to [facility]. I called Tiffany to let her know, also.

## 2024-01-11 NOTE — Telephone Encounter (Signed)
 Please let wife and RN know I sent antibiotic to [facility]

## 2024-01-12 ENCOUNTER — Encounter: Payer: Self-pay | Admitting: Oncology

## 2024-03-12 ENCOUNTER — Ambulatory Visit

## 2024-03-13 ENCOUNTER — Telehealth: Payer: Self-pay

## 2024-03-13 NOTE — Telephone Encounter (Signed)
 Copied from CRM [id number]. Topic: Clinical - Medication Question >> Mar 13, 2024  1:57 PM [name] wrote: Reason for CRM: Tiffany with [facility] [phone]  Glucose levels have ranged for the past 1 1/2 week 156 to 330  Tiffany would like to know if medications needs to be changed  glipiZIDE  (GLUCOTROL ) 5 MG tablet  Over all he feels ok unless reading goes to 330 patient becomes lethargic

## 2024-03-14 NOTE — Telephone Encounter (Signed)
 Called and spoke to Pilger. We were both concerned with 10 mg possibly causing too much of a drop. So she will advise [name] to do 7.5 mg for a few days and if he still is elevated, go to 10 mg.

## 2024-03-27 ENCOUNTER — Ambulatory Visit: Admitting: Internal Medicine

## 2024-03-27 ENCOUNTER — Encounter: Payer: Self-pay | Admitting: Internal Medicine

## 2024-03-27 VITALS — BP 112/68 | HR 60 | Resp 18

## 2024-03-27 DIAGNOSIS — M48062 Spinal stenosis, lumbar region with neurogenic claudication: Secondary | ICD-10-CM | POA: Diagnosis not present

## 2024-03-27 DIAGNOSIS — E1149 Type 2 diabetes mellitus with other diabetic neurological complication: Secondary | ICD-10-CM | POA: Diagnosis not present

## 2024-03-27 DIAGNOSIS — G214 Vascular parkinsonism: Secondary | ICD-10-CM

## 2024-03-27 DIAGNOSIS — Z7984 Long term (current) use of oral hypoglycemic drugs: Secondary | ICD-10-CM

## 2024-03-27 DIAGNOSIS — F331 Major depressive disorder, recurrent, moderate: Secondary | ICD-10-CM

## 2024-03-27 DIAGNOSIS — G8220 Paraplegia, unspecified: Secondary | ICD-10-CM | POA: Diagnosis not present

## 2024-03-27 MED ORDER — GLIPIZIDE 5 MG PO TABS: 7.5000 mg | ORAL_TABLET | Freq: Every day | ORAL | 3 refills | Status: AC

## 2024-03-27 NOTE — Progress Notes (Signed)
 Subjective:    Patient ID: Randy Mcdaniel., male    DOB: [birth year], 83 y.o.   MRN: [id number]  HPI Home visit for follow up of chronic medical conditions Home/bed bound patient on hospice Wife is here as usual  Doing okay Sugars have been fluctuating a lot Has CGM and time in target 63% with 3% low Has had as high as 300--but only after food/milkshake Did increase to 7.5mg  daily of the glipizide   Does feel weak at times---before eating Not sleeping well Hospice has given temazepam 15mg  --and that helps when given with the melatonin Appetite is good---but didn't eat while wife and family was at the beach last week  No chest pain No SOB Some dizziness--not positional. No syncope  Current Outpatient Medications on File Prior to Visit  Medication Sig Dispense Refill   temazepam (RESTORIL) 15 MG capsule Take 15 mg by mouth at bedtime as needed for sleep.     acetaminophen  (TYLENOL ) 650 MG CR tablet Take 650 mg by mouth every 8 (eight) hours as needed for pain.     escitalopram  (LEXAPRO ) 20 MG tablet Take 1 tablet (20 mg total) by mouth daily. 1 tablet 0   gabapentin  (NEURONTIN ) 300 MG capsule Take 1,200 mg by mouth at bedtime.     levothyroxine  (SYNTHROID ) 75 MCG tablet TAKE ONE TABLET BY MOUTH ONCE DAILY 90 tablet 3   neomycin -polymyxin b -dexamethasone (MAXITROL) 3.5-10000-0.1 SUSP Place 1 drop into both eyes every 6 (six) hours. 6 mL 1   vitamin B-12 (CYANOCOBALAMIN ) 1000 MCG tablet Take 1,000 mcg by mouth daily.     No current facility-administered medications on file prior to visit.    Allergies  Allergen Reactions   Vytorin [Ezetimibe-Simvastatin] Nausea Only    patient nor wife does not remember    Past Medical History:  Diagnosis Date   Anemia    Anxiety    Dementia (HCC)    Depression    recurrent and severe   Diabetes mellitus    Type II with nephropathy   GI bleed    HLD (hyperlipidemia)    Hypertension    Hypothyroidism    Internal hemorrhoid     Osteoarthritis    Personal history of colonic adenoma 12/03/2012    Past Surgical History:  Procedure Laterality Date   COLONOSCOPY     COLONOSCOPY WITH PROPOFOL  N/A 12/20/2016   Procedure: COLONOSCOPY WITH PROPOFOL ;  Surgeon: Ruel Kung, MD;  Location: [facility] ENDOSCOPY;  Service: Endoscopy;  Laterality: N/A;   ESOPHAGOGASTRODUODENOSCOPY (EGD) WITH PROPOFOL  N/A 12/17/2016   Procedure: ESOPHAGOGASTRODUODENOSCOPY (EGD) WITH PROPOFOL ;  Surgeon: Jerrell Sol, MD;  Location: [facility] ENDOSCOPY;  Service: Endoscopy;  Laterality: N/A;   FLEXIBLE SIGMOIDOSCOPY N/A 12/17/2016   Procedure: FLEXIBLE SIGMOIDOSCOPY;  Surgeon: Jerrell Sol, MD;  Location:  [facility];  Service: Endoscopy;  Laterality: N/A;   HEMORRHOID BANDING  04/2017   HEMORRHOID SURGERY N/A 06/14/2017   Procedure: HEMORRHOIDECTOMY;  Surgeon: Dellie Louanne KANDICE, MD;  Location: [facility] ORS;  Service: General;  Laterality: N/A;   HERNIA REPAIR     JOINT REPLACEMENT Bilateral    PROCTOSCOPY N/A 06/14/2017   Procedure: PROCTOSCOPY;  Surgeon: Dellie Louanne KANDICE, MD;  Location: [facility] OR[facility];  Service: General;  Laterality: N/A;   TOTAL KNEE ARTHROPLASTY  11/09   Right-Dr. Roy ([facility])   TOTAL KNEE ARTHROPLASTY  6/10   Left-Dr. Roy ([facility])   UMBILICAL HERNIA REPAIR  2000    Family History  Problem Relation Age of Onset   Heart failure Father  Diabetes Father    Depression Father    Coronary artery disease Father    Breast cancer Sister 33   Lung cancer Sister    Depression Sister    Stomach cancer Mother 38   Stroke Other        Paternal side   Parkinsonism Other        Cousin   Colon cancer Neg Hx    Rectal cancer Neg Hx    Esophageal cancer Neg Hx    Colon polyps Neg Hx     Social History   Socioeconomic History   Marital status: Married    Spouse name: Randy Mcdaniel   Number of children: 2   Years of education: Not on file   Highest education level: Not on file  Occupational History   Occupation: Retired   Tobacco Use   Smoking status: Never    Passive exposure: Past   Smokeless tobacco: Former    Types: Chew    Quit date: 05/15/2002  Vaping Use   Vaping status: Never Used  Substance and Sexual Activity   Alcohol use: No    Alcohol/week: 0.0 standard drinks of alcohol   Drug use: No   Sexual activity: Not Currently  Other Topics Concern   Not on file  Social History Narrative   Has living will.   Wife is formal health care POA.  Daughter [name] is alternate   Would accept resuscitation attempts   No tube feeds if cognitively unaware   Social Drivers of Health   Financial Resource Strain: Not on file  Food Insecurity: No Food Insecurity (07/05/2023)   Hunger Vital Sign    Worried About Running Out of Food in the Last Year: Never true    Ran Out of Food in the Last Year: Never true  Transportation Needs: No Transportation Needs (07/05/2023)   PRAPARE - Administrator, Civil Service (Medical): No    Lack of Transportation (Non-Medical): No  Physical Activity: Not on file  Stress: Not on file  Social Connections: Not on file  Intimate Partner Violence: Not At Risk (07/05/2023)   Humiliation, Afraid, Rape, and Kick questionnaire    Fear of Current or Ex-Partner: No    Emotionally Abused: No    Physically Abused: No    Sexually Abused: No   Review of Systems Foley still in---no problems with that. Did have some leakage around the catheter last night---but not since that Bowels move daily Some pain in feet--hospice nurse gave booties to reduce pressure (some early skin changes) No other skin ulcers or rash (has the pressure relief mattress)    Objective:   Physical Exam Constitutional:      Comments: In bed Some wasting--especially in legs and face  Cardiovascular:     Rate and Rhythm: Normal rate and regular rhythm.     Heart sounds: No murmur heard.    No gallop.  Pulmonary:     Effort: Pulmonary effort is normal.     Breath sounds: Normal breath sounds.  No wheezing or rales.  Abdominal:     Palpations: Abdomen is soft.     Tenderness: There is no abdominal tenderness.  Musculoskeletal:     Cervical back: Neck supple.  Lymphadenopathy:     Cervical: No cervical adenopathy.  Skin:    Comments: Slight redness on right lateral malleolus  Neurological:     Mental Status: He is alert.  Psychiatric:        Mood and Affect: Mood  normal.        Behavior: Behavior normal.            Assessment & Plan:

## 2024-03-27 NOTE — Assessment & Plan Note (Signed)
 Almost paradoxically, his mood is better since he has taken to bed

## 2024-03-27 NOTE — Assessment & Plan Note (Signed)
 No functional leg use Bed bound and on hospice

## 2024-03-27 NOTE — Assessment & Plan Note (Signed)
 Sugar running a bit higher---but real highs (over 200) are just post prandial Okay to increase the glipizide  to 7.5mg  daily--but monitor for increased low sugar issues

## 2024-03-27 NOTE — Assessment & Plan Note (Signed)
 Bed bound but no sig pain while in bed

## 2024-03-27 NOTE — Assessment & Plan Note (Signed)
 Bradykinesia Part of his bed bound status

## 2024-03-29 ENCOUNTER — Encounter: Payer: Self-pay | Admitting: Internal Medicine

## 2024-04-01 MED ORDER — FREESTYLE LIBRE 2 PLUS SENSOR MISC: 1.0000 | 4 refills | Status: AC

## 2024-04-01 NOTE — Addendum Note (Signed)
 Addended by: KALLIE CLOTILDA SQUIBB on: 04/01/2024 10:04 AM   Modules accepted: Orders

## 2024-04-16 ENCOUNTER — Other Ambulatory Visit (HOSPITAL_COMMUNITY): Payer: Self-pay

## 2024-04-17 ENCOUNTER — Telehealth: Payer: Self-pay

## 2024-04-17 NOTE — Telephone Encounter (Signed)
 Pharmacy Patient Advocate Encounter  Received notification from [employer] that Prior Authorization for FreeStyle Libre 2 Plus Sensor  has been DENIED.  Full denial letter will be uploaded to the media tab. See denial reason below.   PA #/Case ID/Reference #: [id number]
